# Patient Record
Sex: Female | Born: 1940 | Race: White | Hispanic: No | Smoking: Former smoker
Health system: Southern US, Community
[De-identification: ages and names within clinical notes are randomized; demographics above are authoritative.]

## PROBLEM LIST (undated history)

## (undated) DIAGNOSIS — T4145XA Adverse effect of unspecified anesthetic, initial encounter: Secondary | ICD-10-CM

## (undated) DIAGNOSIS — K449 Diaphragmatic hernia without obstruction or gangrene: Secondary | ICD-10-CM

## (undated) DIAGNOSIS — F419 Anxiety disorder, unspecified: Secondary | ICD-10-CM

## (undated) DIAGNOSIS — Z9889 Other specified postprocedural states: Secondary | ICD-10-CM

## (undated) DIAGNOSIS — T8859XA Other complications of anesthesia, initial encounter: Secondary | ICD-10-CM

## (undated) DIAGNOSIS — F32A Depression, unspecified: Secondary | ICD-10-CM

## (undated) DIAGNOSIS — E785 Hyperlipidemia, unspecified: Secondary | ICD-10-CM

## (undated) DIAGNOSIS — I10 Essential (primary) hypertension: Secondary | ICD-10-CM

## (undated) DIAGNOSIS — F329 Major depressive disorder, single episode, unspecified: Secondary | ICD-10-CM

## (undated) DIAGNOSIS — M797 Fibromyalgia: Secondary | ICD-10-CM

## (undated) DIAGNOSIS — K219 Gastro-esophageal reflux disease without esophagitis: Secondary | ICD-10-CM

## (undated) DIAGNOSIS — E039 Hypothyroidism, unspecified: Secondary | ICD-10-CM

## (undated) DIAGNOSIS — R112 Nausea with vomiting, unspecified: Secondary | ICD-10-CM

## (undated) HISTORY — PX: BACK SURGERY: SHX140

## (undated) HISTORY — PX: OTHER SURGICAL HISTORY: SHX169

## (undated) HISTORY — PX: KNEE SURGERY: SHX244

## (undated) HISTORY — DX: Diaphragmatic hernia without obstruction or gangrene: K44.9

---

## 1984-09-01 HISTORY — PX: CARPAL TUNNEL RELEASE: SHX101

## 2000-09-01 HISTORY — PX: APPENDECTOMY: SHX54

## 2001-03-03 ENCOUNTER — Ambulatory Visit (HOSPITAL_BASED_OUTPATIENT_CLINIC_OR_DEPARTMENT_OTHER): Admission: RE | Admit: 2001-03-03 | Discharge: 2001-03-03 | Payer: Self-pay | Admitting: Orthopedic Surgery

## 2001-12-09 ENCOUNTER — Ambulatory Visit (HOSPITAL_COMMUNITY): Admission: RE | Admit: 2001-12-09 | Discharge: 2001-12-09 | Payer: Self-pay | Admitting: Orthopaedic Surgery

## 2002-05-03 ENCOUNTER — Ambulatory Visit (HOSPITAL_COMMUNITY): Admission: RE | Admit: 2002-05-03 | Discharge: 2002-05-03 | Payer: Self-pay | Admitting: Orthopaedic Surgery

## 2002-05-03 ENCOUNTER — Encounter: Payer: Self-pay | Admitting: Orthopaedic Surgery

## 2004-05-09 ENCOUNTER — Encounter (HOSPITAL_COMMUNITY): Admission: RE | Admit: 2004-05-09 | Discharge: 2004-05-31 | Payer: Self-pay | Admitting: Orthopaedic Surgery

## 2004-09-30 ENCOUNTER — Ambulatory Visit (HOSPITAL_COMMUNITY): Admission: RE | Admit: 2004-09-30 | Discharge: 2004-09-30 | Payer: Self-pay | Admitting: Orthopaedic Surgery

## 2004-10-08 ENCOUNTER — Ambulatory Visit (HOSPITAL_COMMUNITY): Admission: RE | Admit: 2004-10-08 | Discharge: 2004-10-08 | Payer: Self-pay | Admitting: Orthopaedic Surgery

## 2004-10-10 ENCOUNTER — Encounter (HOSPITAL_COMMUNITY): Admission: RE | Admit: 2004-10-10 | Discharge: 2004-11-09 | Payer: Self-pay | Admitting: Orthopaedic Surgery

## 2005-02-04 ENCOUNTER — Ambulatory Visit: Payer: Self-pay | Admitting: Family Medicine

## 2005-02-11 ENCOUNTER — Ambulatory Visit: Payer: Self-pay | Admitting: Family Medicine

## 2005-05-12 ENCOUNTER — Ambulatory Visit: Payer: Self-pay | Admitting: Family Medicine

## 2005-06-19 ENCOUNTER — Ambulatory Visit: Payer: Self-pay | Admitting: Family Medicine

## 2005-07-02 ENCOUNTER — Ambulatory Visit: Payer: Self-pay | Admitting: Internal Medicine

## 2005-07-10 ENCOUNTER — Ambulatory Visit (HOSPITAL_COMMUNITY): Admission: RE | Admit: 2005-07-10 | Discharge: 2005-07-10 | Payer: Self-pay

## 2005-07-10 ENCOUNTER — Ambulatory Visit: Payer: Self-pay | Admitting: Internal Medicine

## 2005-07-21 ENCOUNTER — Ambulatory Visit (HOSPITAL_COMMUNITY): Admission: RE | Admit: 2005-07-21 | Discharge: 2005-07-21 | Payer: Self-pay | Admitting: Internal Medicine

## 2005-07-22 ENCOUNTER — Ambulatory Visit: Payer: Self-pay | Admitting: Family Medicine

## 2005-08-07 ENCOUNTER — Ambulatory Visit: Payer: Self-pay | Admitting: Family Medicine

## 2006-06-03 ENCOUNTER — Ambulatory Visit: Payer: Self-pay | Admitting: Family Medicine

## 2006-08-04 ENCOUNTER — Ambulatory Visit: Payer: Self-pay | Admitting: Family Medicine

## 2006-11-12 ENCOUNTER — Ambulatory Visit: Payer: Self-pay | Admitting: Family Medicine

## 2006-12-23 ENCOUNTER — Ambulatory Visit: Payer: Self-pay | Admitting: Family Medicine

## 2007-01-11 ENCOUNTER — Ambulatory Visit: Payer: Self-pay | Admitting: Family Medicine

## 2007-01-29 ENCOUNTER — Ambulatory Visit: Payer: Self-pay | Admitting: Family Medicine

## 2007-02-11 ENCOUNTER — Encounter: Admission: RE | Admit: 2007-02-11 | Discharge: 2007-05-12 | Payer: Self-pay | Admitting: Orthopedic Surgery

## 2007-03-17 ENCOUNTER — Ambulatory Visit (HOSPITAL_COMMUNITY): Admission: RE | Admit: 2007-03-17 | Discharge: 2007-03-17 | Payer: Self-pay | Admitting: Orthopaedic Surgery

## 2007-09-29 ENCOUNTER — Inpatient Hospital Stay (HOSPITAL_COMMUNITY): Admission: RE | Admit: 2007-09-29 | Discharge: 2007-10-03 | Payer: Self-pay | Admitting: Neurosurgery

## 2008-09-02 ENCOUNTER — Emergency Department (HOSPITAL_COMMUNITY): Admission: EM | Admit: 2008-09-02 | Discharge: 2008-09-02 | Payer: Self-pay | Admitting: Emergency Medicine

## 2009-04-02 ENCOUNTER — Other Ambulatory Visit: Admission: RE | Admit: 2009-04-02 | Discharge: 2009-04-02 | Payer: Self-pay | Admitting: Obstetrics and Gynecology

## 2009-05-08 ENCOUNTER — Ambulatory Visit (HOSPITAL_COMMUNITY): Admission: RE | Admit: 2009-05-08 | Discharge: 2009-05-08 | Payer: Self-pay | Admitting: Obstetrics & Gynecology

## 2009-08-29 ENCOUNTER — Ambulatory Visit (HOSPITAL_COMMUNITY): Admission: RE | Admit: 2009-08-29 | Discharge: 2009-08-29 | Payer: Self-pay | Admitting: Neurosurgery

## 2009-10-03 ENCOUNTER — Inpatient Hospital Stay (HOSPITAL_COMMUNITY): Admission: RE | Admit: 2009-10-03 | Discharge: 2009-10-06 | Payer: Self-pay | Admitting: Neurosurgery

## 2010-05-14 ENCOUNTER — Other Ambulatory Visit: Admission: RE | Admit: 2010-05-14 | Discharge: 2010-05-14 | Payer: Self-pay | Admitting: Obstetrics & Gynecology

## 2010-11-17 LAB — CBC
HCT: 37.6 % (ref 36.0–46.0)
Hemoglobin: 12.9 g/dL (ref 12.0–15.0)
MCHC: 34.3 g/dL (ref 30.0–36.0)
MCV: 91 fL (ref 78.0–100.0)
Platelets: 211 10*3/uL (ref 150–400)
RBC: 4.14 MIL/uL (ref 3.87–5.11)
RDW: 13.7 % (ref 11.5–15.5)
WBC: 7.8 10*3/uL (ref 4.0–10.5)

## 2010-11-17 LAB — BASIC METABOLIC PANEL
BUN: 23 mg/dL (ref 6–23)
CO2: 28 mEq/L (ref 19–32)
Calcium: 9.2 mg/dL (ref 8.4–10.5)
Chloride: 102 mEq/L (ref 96–112)
Creatinine, Ser: 0.94 mg/dL (ref 0.4–1.2)
GFR calc Af Amer: >60 mL/min (ref >60)
GFR calc non Af Amer: 59 mL/min — ABNORMAL LOW (ref >60)
Glucose, Bld: 104 mg/dL — ABNORMAL HIGH (ref 70–99)
Potassium: 4.7 mEq/L (ref 3.5–5.1)
Sodium: 137 mEq/L (ref 135–145)

## 2010-11-17 LAB — TYPE AND SCREEN
ABO/RH(D): A POS
Antibody Screen: NEGATIVE

## 2010-11-20 LAB — BASIC METABOLIC PANEL
BUN: 15 mg/dL (ref 6–23)
CO2: 27 mEq/L (ref 19–32)
Calcium: 7.6 mg/dL — ABNORMAL LOW (ref 8.4–10.5)
Chloride: 105 mEq/L (ref 96–112)
Creatinine, Ser: 1.03 mg/dL (ref 0.4–1.2)
GFR calc Af Amer: >60 mL/min (ref >60)
GFR calc non Af Amer: 53 mL/min — ABNORMAL LOW (ref >60)
Glucose, Bld: 124 mg/dL — ABNORMAL HIGH (ref 70–99)
Potassium: 3.9 mEq/L (ref 3.5–5.1)
Sodium: 136 mEq/L (ref 135–145)

## 2010-11-20 LAB — GLUCOSE, CAPILLARY
Glucose-Capillary: 102 mg/dL — ABNORMAL HIGH (ref 70–99)
Glucose-Capillary: 118 mg/dL — ABNORMAL HIGH (ref 70–99)
Glucose-Capillary: 120 mg/dL — ABNORMAL HIGH (ref 70–99)
Glucose-Capillary: 124 mg/dL — ABNORMAL HIGH (ref 70–99)
Glucose-Capillary: 124 mg/dL — ABNORMAL HIGH (ref 70–99)
Glucose-Capillary: 125 mg/dL — ABNORMAL HIGH (ref 70–99)
Glucose-Capillary: 126 mg/dL — ABNORMAL HIGH (ref 70–99)
Glucose-Capillary: 141 mg/dL — ABNORMAL HIGH (ref 70–99)
Glucose-Capillary: 145 mg/dL — ABNORMAL HIGH (ref 70–99)
Glucose-Capillary: 155 mg/dL — ABNORMAL HIGH (ref 70–99)
Glucose-Capillary: 97 mg/dL (ref 70–99)
Glucose-Capillary: 99 mg/dL (ref 70–99)

## 2010-11-20 LAB — CBC
HCT: 27.8 % — ABNORMAL LOW (ref 36.0–46.0)
Hemoglobin: 9.4 g/dL — ABNORMAL LOW (ref 12.0–15.0)
MCHC: 34.1 g/dL (ref 30.0–36.0)
MCV: 91.3 fL (ref 78.0–100.0)
Platelets: 174 10*3/uL (ref 150–400)
RBC: 3.04 MIL/uL — ABNORMAL LOW (ref 3.87–5.11)
RDW: 14.3 % (ref 11.5–15.5)
WBC: 6.9 10*3/uL (ref 4.0–10.5)

## 2010-12-02 LAB — CREATININE, SERUM
Creatinine, Ser: 1 mg/dL (ref 0.4–1.2)
GFR calc Af Amer: >60 mL/min (ref >60)
GFR calc non Af Amer: 55 mL/min — ABNORMAL LOW (ref >60)

## 2010-12-16 LAB — BASIC METABOLIC PANEL
BUN: 13 mg/dL (ref 6–23)
CO2: 24 mEq/L (ref 19–32)
Calcium: 8.7 mg/dL (ref 8.4–10.5)
Chloride: 103 mEq/L (ref 96–112)
Creatinine, Ser: 0.93 mg/dL (ref 0.4–1.2)
GFR calc Af Amer: >60 mL/min (ref >60)
GFR calc non Af Amer: >60 mL/min (ref >60)
Glucose, Bld: 124 mg/dL — ABNORMAL HIGH (ref 70–99)
Potassium: 3.8 mEq/L (ref 3.5–5.1)
Sodium: 133 mEq/L — ABNORMAL LOW (ref 135–145)

## 2010-12-16 LAB — DIFFERENTIAL
Basophils Absolute: 0 10*3/uL (ref 0.0–0.1)
Basophils Relative: 0 % (ref 0–1)
Eosinophils Absolute: 0.1 10*3/uL (ref 0.0–0.7)
Eosinophils Relative: 1 % (ref 0–5)
Lymphocytes Relative: 27 % (ref 12–46)
Lymphs Abs: 2.4 10*3/uL (ref 0.7–4.0)
Monocytes Absolute: 0.8 10*3/uL (ref 0.1–1.0)
Monocytes Relative: 9 % (ref 3–12)
Neutro Abs: 5.7 10*3/uL (ref 1.7–7.7)
Neutrophils Relative %: 64 % (ref 43–77)

## 2010-12-16 LAB — CBC
HCT: 39.1 % (ref 36.0–46.0)
Hemoglobin: 13 g/dL (ref 12.0–15.0)
MCHC: 33.3 g/dL (ref 30.0–36.0)
MCV: 91.1 fL (ref 78.0–100.0)
Platelets: 185 10*3/uL (ref 150–400)
RBC: 4.29 MIL/uL (ref 3.87–5.11)
RDW: 13.1 % (ref 11.5–15.5)
WBC: 9 10*3/uL (ref 4.0–10.5)

## 2010-12-16 LAB — GLUCOSE, CAPILLARY: Glucose-Capillary: 127 mg/dL — ABNORMAL HIGH (ref 70–99)

## 2011-01-14 NOTE — Discharge Summary (Signed)
Cynthia Mays, Cynthia Mays           ACCOUNT NO.:  [id number]   MEDICAL RECORD NO.:  [id number]          PATIENT TYPE:  INP   LOCATION:  3034                         FACILITY:  [facility]   PHYSICIAN:  Hilda Lias, M.D.   DATE OF BIRTH:  Nov 15, 1940   DATE OF ADMISSION:  09/29/2007  DATE OF DISCHARGE:  10/03/2007                               DISCHARGE SUMMARY   ADMISSION DIAGNOSES:  1. L4-L5 spondylolisthesis.  2. Degenerative disk disease.   FINAL DIAGNOSES:  1. L4-L5 spondylolisthesis.  2. Degenerative disk disease.   CLINICAL HISTORY:  The patient was admitted by Dr. Lovell Sheehan because of  back pain __________ in both legs.  X-rays showed degenerative disk  disease at the L4-L5.  Surgery was advised.   LABORATORY:  Normal.   HOSPITAL COURSE:  The patient was taken to surgery.  An L4-5 fusion was  done.  Today she is doing great, ambulating.  She has no complaints  whatsoever.  She is being discharged to be followed by Dr. Lovell Sheehan.   CONDITION ON DISCHARGE:  Improved.   MEDICATIONS:  Percocet, diazepam.   She will be taking all the previous medications she was on.   ACTIVITY:  Not to drive for next 3 weeks..   FOLLOWUP:  Call to be seen by Dr. Lovell Sheehan in 3 weeks.           ______________________________  Hilda Lias, M.D.     EB/MEDQ  D:  10/03/2007  T:  10/03/2007  Job:  [id number]

## 2011-01-14 NOTE — Op Note (Signed)
NAME:  NIKO, JAKEL           ACCOUNT NO.:  [id number]   MEDICAL RECORD NO.:  [id number]          PATIENT TYPE:  INP   LOCATION:  3034                         FACILITY:  [facility]   PHYSICIAN:  Cristi Loron, M.D.DATE OF BIRTH:  [birth year]   DATE OF PROCEDURE:  09/29/2007  DATE OF DISCHARGE:                               OPERATIVE REPORT   BRIEF HISTORY:  The patient is 70 year old white female who has suffered  from back and bilateral leg pain consistent with neurogenic  claudication.  She failed medical management.  Work up with a lumbar  MRI, which demonstrated the patient had spondylolisthesis and severe  spinal stenosis with severe facet arthropathy at L4-5 and degenerative  disease.  I discussed various treatments options with the patient  including surgery.  She weighed the risks, benefits, and alternatives of  the surgery, so I will proceed with a decompressive laminectomy of L5  with an L4-5 instrumentation and fusion.   PREOPERATIVE DIAGNOSES:  L4-5 grade 1 acquired spondylolisthesis L4-5,  facet arthropathy, spondylosis, disk degeneration and stenosis, lumbar  radiculopathy, and lumbago.   POSTOPERATIVE DIAGNOSES:  L4-5 grade 1 acquired spondylolisthesis L4-5,  facet arthropathy, spondylosis, disk degeneration and stenosis, lumbar  radiculopathy, and lumbago.   PROCEDURE:  Decompressive bilateral laminotomies to decompress the  bilateral L4-L5 nerve roots; L4-5 transforaminal lumbar interbody fusion  with local autograft bone and Actifuse bone graft extender; insertion of  L4-5 interbody prosthesis (Alphatec PEEK interbody prosthesis).  An L4-5  posterior nonsegmental spacing with legacy titanium pedicle screws and  rods; L4-5 posterolateral arthrodesis with local autograft bone and  Actifuse bone graft extender.   SURGEON:  Cristi Loron, M.D.   ASSISTANT:  Payton Doughty, M.D.   ANESTHESIA:  General endotracheal.   ESTIMATED BLOOD LOSS:  250 mL.   SPECIMENS:  None.   DRAINS:  None.   COMPLICATIONS:  None.   PROCEDURE:  The patient was brought to the operating room by anesthesia  team.  General endotracheal anesthesia was induced.  The patient was  turned to the prone position on Wilson frame.  The lumbosacral region  was then prepared with Betadine scrub and Betadine solution.  Sterile  drapes were applied and then injected area was massaged with Marcaine  with epinephrine solution.  A scalpel making a linear midline incision  over the L4-5 interspace.  I used electrocautery to perform a bilateral  subperiosteal dissection exposing spinous process lamina of L4-5.  We  obtained an intraoperative radiograph to confirm our location.   We then inserted the McCullogh retractor for exposure.  We began the  decompression by performing bilateral L4 laminotomies with a high-speed  drill.  I widened the laminotomies with Kerrison punch and we  encountered severely hypertrophied ligamentum flavum at L4-5.  We  removed this.  We then performed foraminotomies about the bilateral L4  and L5 nerve roots completing the decompression at this level.  Of note,  the decompression at this level was greater than than would have been  required due to fusion secondary to the spondylolisthesis and the severe  stenosis/facetarthropathy.   Having completed the decompression,  we now turned our attention to the  arthrodesis.  I removed the inferior facet of L4 on the left then could  gain wire access  to the lateral aspect of the left L4-5 intervertebral  disk.  I incised the intervertebral disk with 15 blade scalpel performed  a partial intervertebral diskectomy with pituitary forceps.  We prepared  the vertebral endplates for fusion using the curettes.  We then used  trial spacers and determined to use a 10 x 26 mm PEEK Alphatec interbody  spacer.  We prefilled this spacer with local autograft bone and  Actifuse, as we did with the ventral disk space.   We inserted the  prosthesis into the left L4-5 interspace, of course after retracting the  neural structures out of harms way.  We then used the bone tamps to turn  the prosthesis laterally and then we filled posteriorly the disk space  with Actifuse and local autograft bone completing the transforaminal  lumbar interbody fusion.   We now turned our attention to the instrumentation under fluoroscopic  guidance.  I cannulated the bilateral L4-L5 pedicles with bone probe.  We tapped the pedicles of 5.5 mm tap and then probed inside the tapped  pedicles around cortical breeches and inserted 6.5 x 50 mm Bioscrews  bilaterally at L4 into the L4 pedicles and 6.5 x 45 bilaterally into the  L5 pedicles under fluoroscopic guidance.  We then palpated along the  medial aspect of the pedicles and noted that there was no cortical  breeches and the L4-L5 nerve roots were not injured.  We then connected  unilateral pedicle screws with a lordotic rod.  We slightly compressed  the construct and then secured the rod in place by tightening the caps  appropriately.  This completed the instrumentation.   We now turned our attention to posterolateral arthrodesis.  We used high-  speed drill to decorticate the remainder of the right L4-5 facet pars  and transverse prosthesis. With the combination of local autograft bone  and Actifuse bone graft extender over these decorticated posterolateral  structures completing the posterolateral arthrodesis.   We then obtained hemostasis using bipolar cautery, irrigated the wound  with bacitracin solution.  We then inspected the thecal sac and  bilateral L4-5 nerve roots, and noted they were well decompressed.  We  then removed the retractor and then reapproximated the patient's  thoracolumbar fascia with interrupted #1 Vicryl suture, subcutaneous  tissue with interrupted 2-0 Vicryl suture and skin with Steri-Strips and  Benzoin.  The wound was then covered with  bacitracin ointment.  A  sterile dressing was applied.  The drapes were removed and the patient  was subsequently returned to the supine position where she was extubated  by the anesthesia team and transported to postanesthesia care unit in  stable condition.  All sponge, instrument, and needle counts were  correct in this case.      Cristi Loron, M.D.  Electronically Signed     JDJ/MEDQ  D:  09/29/2007  T:  09/30/2007  Job:  [id number]

## 2011-01-17 NOTE — Op Note (Signed)
[facility]  Patient:    Cynthia Mays, INGLES Visit Number: [id number] MRN: [id number]          Service Type: DSU Location: DAY Attending Physician:  Windle Guard Dictated by:   Darreld Mclean, M.D. Proc. Date: 12/09/01 Admit Date:  12/09/2001                             Operative Report  PREOPERATIVE DIAGNOSES: 1. Tear of medial meniscus, right knee. 2. Degenerative joint disease.  POSTOPERATIVE DIAGNOSES: 1. Tear of medial meniscus, right knee. 2. Degenerative joint disease.  OPERATION:  Operative arthroscopy and partial median meniscectomy.  SURGEON:  Darreld Mclean, M.D.  ASSISTANT:  Candace Cruise, P.A.-C.  ANESTHESIA:  General.  DRAINS:  None.  TOURNIQUET TIME:  Please refer to anesthesia record.  INDICATIONS:  The patient was playing tennis.  An MRI shows tear of the medial meniscus of the knee on the right.  She has not improved with conservative treatment.  Risks and alternatives have been discussed.  The patient appears to understand and agrees to the procedure as outlined.  OPERATIVE FINDINGS:  The suprapatellar pouch shows minimal synovitis.  There was DJD on the surface of the patella grade II.  There was a tear initially the posterior horn.  The medial meniscus had a _____ tear with multiple segments.  There were grade 2-3 changes in the femoral condyle tibial plateau. Medially, the anterior cruciate was intact.  Laterally there were just grade 2 changes, and the meniscus was intact.  No loose bodies.  Gutters were negative.  DESCRIPTION OF PROCEDURE:  The patient was placed supine on the operating table.  Tourniquet and leg holder placed and inflated on the right upper thigh.  General anesthesia was given.  She was prepped and draped in the usual manner.  The leg was elevated and wrapped circumferentially with an Esmarch bandage.  Tourniquet inflated 350 mmHg.  Esmarch bandage removed.  In-flow cannula inserted medially.   The lactated Ringers were started and instilled in the knee by an infusion pump.  Arthroscope inserted laterally.  Knee examined systematically examined.  Please see findings above.  Attention was directed medially.  Using the meniscal shaver and meniscal punch, the meniscus tear was removed, and a good smooth contour was obtained.  The knee was systematically re-examined and no pathology found.  The wound was irrigated with the remaining part of the lactated Ringers.  Wounds were reapproximated with 3-0 nylon interrupted vertical mattress manner.  Marcaine 0.25% was instilled in each portal.  Sterile dressing applied.  The tourniquet was deflated in the usual fashion.  Please see anesthesia record for tourniquet time.  Sterile dressing applied.  Bulky dressing applied.  Knee immobilizer applied.  The patient tolerated the procedure well and went to recovery in good condition. Sponge and needle counts were correct.  Appropriate anesthesia was given to the patient.  DISPOSITION:  I will see him in the office in approximately 10 days to two weeks.  Physical therapy has been arranged.  If there are any difficulties she is to contact me through the office or hospital. Dictated by:   Darreld Mclean, M.D. Attending Physician:  Windle Guard DD:  12/09/01 TD:  12/09/01 Job: [id number]

## 2011-01-17 NOTE — Op Note (Signed)
[place]  Patient:    Cynthia Mays, Cynthia Mays               MRN: [id number] Proc. Date: 03/03/01 Adm. Date:  [id number] Attending:  Marlowe Shores                           Operative Report  PREOPERATIVE DIAGNOSES:  Right carpal tunnel syndrome, right long finger stenosing tenosynovitis, left long finger stenosing tenosynovitis.  POSTOPERATIVE DIAGNOSES:  Right carpal tunnel syndrome, right long finger stenosing tenosynovitis, left long finger stenosing tenosynovitis.  PROCEDURE: 1. Right carpal tunnel release. 2. Right long finger A1 pulley release. 3. Injection of left long finger A1 pulley.  SURGEON:  Artist Pais. Mina Marble, M.D.  ASSISTANT:  None.  ANESTHESIA:  General.  TOURNIQUET TIME:  25 minutes.  COMPLICATIONS:  None.  DRAINS:  None.  OPERATIVE REPORT:  Patient was taken to the operating room where after the induction of adequate general anesthesia the right upper extremity was prepped and draped in the usual sterile fashion.  An Esmarch was used to exsanguinate the limb.  Tourniquet was then inflated to 250 mmHg.  At this point in time a 2 cm incision was made in the palmar aspect of the right hand in line with the radial border of the ring finger ______ thenar crease.  The incision was taken down through the skin and subcutaneous tissues until the palmar fascia was identified.  The palmar fascia was split longitudinally and thus exposing the transverse carpal ligament.  Next, using a 15 blade the transverse carpal ligament was divided in its entirety, thus decompressing the median nerve. The canal was inspected.  No inner osseus lesions or ganglia present.  The wound was then thoroughly irrigated and closed with a running 3-0 Prolene subcuticular stitch.  At this point in time a 1.5 cm incision was made obliquely in the area of the A1 pulley in the long finger on the right.  The incision was taken down through skin and  subcutaneous tissues, carried carefully down by retracting the neurovascular bundles on the radial ulnar side of the A1 pulley.  After this was done the A1 pulley was split with peritoneotomy scissors, thus freeing up the flexor tendons.  The wound itself was irrigated and closed with a running 3-0 Prolene subcuticular stitch. Steri-Strips, 4 x 4s Plus, and a compressive dressing was applied.  Patient was then injected in the left hand in the area of the A1 pulley using sterile technique using 0.25% plain Marcaine and 40 Kenalog 1 cc each in the left long finger A1 pulley area.  Patient tolerated procedure well and went to recovery room in stable fashion. DD:  03/03/01 TD:  03/03/01 Job: [id number]

## 2011-01-17 NOTE — Consult Note (Signed)
[name], [name]           ACCOUNT NO.:  [id number]   MEDICAL RECORD NO.:  [id number]         PATIENT TYPE:  AMB   LOCATION:                                FACILITY:  [facility]   PHYSICIAN:  Lionel December, M.D.    DATE OF BIRTH:  Jun 15, 1941   DATE OF CONSULTATION:  07/02/2005  DATE OF DISCHARGE:                                   CONSULTATION   REFERRING PHYSICIAN:  Delaney Meigs, M.D.   REASON FOR CONSULTATION:  Chronic GERD, intermittent diarrhea and  constipation.   HISTORY OF PRESENT ILLNESS:  Ms. Cynthia Mays is a 70 year old, Caucasian female  with chronic GERD for at least the last 5 years.  She had been on Nexium 40  mg daily for the last year.  She began to have refractory symptoms and was  started on b.i.d. therapy about 2 months ago.  Her symptoms include  heartburn, indigestion, nausea, vomiting, regurgitation and water brash.  She has never had an EGD.  She denies any dysphagia or odynophagia.  She  also notes alternating bowel movements.  She can have a daily bowel movement  and then can have about 24 hours of loose, watery diarrhea.  She has taken  Imodium on a p.r.n. basis with good relief of her diarrhea.  She complains  of bilateral lower quadrant cramping and bloating with the diarrhea.  She  can have several small to moderate volume stools per day.  She denies any  rectal bleeding, melena or mucus.  She notes the diarrhea has decreased in  frequency since she has discontinued ice cream from her diet.  She rates the  pain 9/10 on pain scale when she does get the cramping.  She has lifelong  spells of intermittent diarrhea, but this has been worse over the last seven  months.   PAST MEDICAL HISTORY:  1.  Colonoscopy in [place] x4 years ago which revealed hemorrhoids and was      otherwise normal per her report.  2.  Hypertension.  3.  Fibromyalgia.  4.  Diabetes mellitus.  5.  Hypothyroidism.  6.  Bilateral knee arthroscopies.  7.  Bilateral carpal tunnel  repair.  8.  C-section x2.  9.  Appendectomy.   CURRENT MEDICATIONS:  1.  Zoloft 50 mg daily.  2.  Procardia XL 60 mg daily.  3.  Synthroid 50 mcg daily.  4.  Allegra D 180 mg b.i.d.  5.  Aspirin 81 mg daily.  6.  Nexium 40 mg b.i.d.  7.  Vicodin ES 7.5/750 q.4h. p.r.n.  8.  Diclofenac 75 mg b.i.d.  9.  Metformin 500 mg nightly.  10. Lipitor 20 mg nightly.  11. Imodium 2 mg p.r.n.   ALLERGIES:  PENICILLIN and CODEINE.   FAMILY HISTORY:  Positive for mother deceased at age 81 secondary to  pancreatic carcinoma.  She has had a father, brother and one child to commit  suicide.   SOCIAL HISTORY:  Ms. Cynthia Mays has been in her second marriage x31 years.  She  has two grown healthy children with one child deceased secondary to suicide.  She is disabled.  She reports a 60-pack-year history of tobacco use quitting  6 years ago.  She denies any alcohol or drug use.   REVIEW OF SYSTEMS:  CONSTITUTIONAL:  Weight is steadily decreasing due to  her dieting and attempt to lower her cholesterol and to help with her  diabetes mellitus.  She denies any anorexia.  She denies any fever or  chills.  She denies nay early satiety.  CARDIOVASCULAR:  Denies chest pain  or palpitations.  She denies any shortness of breath, dyspnea, cough or  hemoptysis.  GASTROINTESTINAL:  See HPI.   PHYSICAL EXAMINATION:  VITAL SIGNS:  Weight 202 pounds, height 60 inches,  temperature 97.6, blood pressure 120/70 and pulse 64.  GENERAL:  Ms. Cynthia Mays is a 70 year old, Caucasian female who is alert,  oriented, pleasant and cooperative in no acute distress.  She is obese.  HEENT:  Sclerae clear.  Conjunctivae pink.  Oropharynx pink and moist  without any lesions.  NECK:  Supple without thyromegaly or masses.  HEART:  Regular rate and rhythm with normal S1, S2 without murmurs, rubs or  gallops.  LUNGS:  Clear to auscultation bilaterally.  ABDOMEN:  Protuberant with multiple striae.  She does have bilateral lower   quadrant tenderness on deep palpation.  No rebound tenderness or guarding.  No hepatosplenomegaly or mass.  RECTAL:  No external lesions visualized.  Good sphincter tone.  There is a  fullness anteriorly on her rectal exam without discrete mass.  Small amount  of light brown stool obtained from the vault which was Hemoccult positive.  EXTREMITIES:  Trace edema bilaterally.  SKIN:  Pink, warm and dry without any rash or jaundice.   LABORATORY DATA AND X-RAY FINDINGS:  LFTs on March 25, 2005, were normal.  She had a hemoglobin A1c of 6.5.  Comprehensive metabolic panel from  May 12, 2005, which was normal except for elevated glucose and a  mildly elevated Alkaline phosphatase at 118.  Her TSH was normal.   IMPRESSION:  Ms. Cynthia Mays is a 70 year old, Caucasian female with chronic  gastroesophageal reflux disease symptoms who has been on twice daily proton  pump inhibitor therapy for about 2 months.  This seems to provide good  relief of her symptoms.  She does have a 57-year-old history of  gastroesophageal reflux disease and therefore should undergo screening  esophagogastroduodenoscopy to rule out changes of Barrett's esophagus.  She  has a lifelong history of intermittent diarrhea worsening over the last 7  months.  She does have several days of normal bowel movements between  episodes.  On exam today, she was found to have Hemoccult positive stool  with no external hemorrhoids to account for this.  I feel since it has been  almost 5 years since she has had a colonoscopy, this needs to be repeated.  I suspect some of her symptoms may be due to diarrhea with predominant  irritable bowel syndrome.   RECOMMENDATIONS:  1.  Levsin one a.c. and h.s., #60 with one refill.  2.  Will request colonoscopy report from Dr. Lovell Sheehan.  3.  Colonoscopy and EGD with Dr. Karilyn Cota in the near future.  I have     discussed this procedure including risks and benefits including, but not      limited to  bleeding, infection, perforation, infection and drug      reaction.  She agrees and consent will be obtained.  She is to hold her      aspirin 3 days prior to the exam.  She is to take half of her metformin      the day prior to and of the procedure.   I would like to thank Dr. Lysbeth Galas for allowing [place] to participate in the care  of Ms. Roseanne Reno.      Nicholas Lose, N.P.      Lionel December, M.D.  Electronically Signed    KC/MEDQ  D:  07/03/2005  T:  07/03/2005  Job:  [id number]

## 2011-01-17 NOTE — H&P (Signed)
NAME:  Cynthia Mays, Cynthia Mays           ACCOUNT NO.:  [id number]   MEDICAL RECORD NO.:  [id number]          PATIENT TYPE:  AMB   LOCATION:  DAY                           FACILITY:  [facility]   PHYSICIAN:  J. Darreld Mclean, M.D. DATE OF BIRTH:  Jul 16, 1941   DATE OF ADMISSION:  DATE OF DISCHARGE:  LH                                HISTORY & PHYSICAL   CHIEF COMPLAINT:  Left knee pain.   The patient is a 70 year old female with pain and tenderness in the left  knee.  This has been bothering her for several weeks.  She fell the first  week of December when visiting a daughter on her left knee.  I saw her in my  office on January 26.  The knee pain was still bothering her and was hurting  significantly.  There was concern about a meniscal tear.  MRI was done of  the knee which shows a tear of the left medial and lateral meniscus of the  knees, oblique tear in the posterior horns of both.  She has DJD of the knee  as well.  Surgery was recommended.  She wants to go ahead and have the  procedure.  Has giving way, swelling, tenderness.  She has a previous  arthroscopy of the right knee in 2003 and did well with this.  This knee  feels like it may be a little worse.  She understands the risks and  imponderables, understands the procedure.   PAST SURGICAL HISTORY:  1.  C section in 1965 and 1967.  2.  Carpal tunnel, left hand in 1984.  3.  Carpal tunnel, right hand in 2002.  4.  Right knee arthroscopy in 2003.   PAST MEDICAL HISTORY:  The patient has history of hypertension, denies heart  disease, lung disease, kidney disease, strokes, paralysis, weakness,  diabetes, TB, rheumatic fever, cancer, polio, ulcer disease, circulatory  problems.   ALLERGIES:  PENICILLIN, NAPROSYN, and CODEINE.   CURRENT MEDICATIONS:  1.  Synthroid 25 mg once daily.  2.  Procardia XL 600 mg once daily.  3.  Zoloft, does unknown, once daily.  4.  Allegra D b.i.d. as needed.  5.  Occasional Tums.   SOCIAL HISTORY:   The patient does not smoke, does not use alcoholic  beverages, high school graduate.  Family doctor is in [place].  The patient  lives in [place].  She is married.   FAMILY HISTORY:  Denies any diseases that run in the family.   PHYSICAL EXAMINATION:  VITAL SIGNS:  Within normal limits.  HEENT:  Negative.  NECK:  Supple.  LUNGS:  Clear to percussion and auscultation.  HEART: Regular rate and rhythm without murmur heard.  ABDOMEN: Soft, nontender, without masses.  EXTREMITIES:  Right knee negative, mild crepitus.  Left knee has crepitus,  swelling, tenderness, with pain and tenderness particularly on the medial  joint line.  McMurray's positive medially.  Other extremities within normal  limits.  CNS:  Intact.  SKIN:  Intact.   IMPRESSION:  1.  Tear of medial and lateral meniscus, left knee.  2.  History of hypertension.  Discussed with patient planned procedure, risks, and imponderables.  Appears  to understand.  Labs are pending.      JWK/MEDQ  D:  10/07/2004  T:  10/07/2004  Job:  [id number]   cc:   [facility]  Fax: [phone]

## 2011-01-17 NOTE — Op Note (Signed)
NAME:  Cynthia Mays, Cynthia Mays           ACCOUNT NO.:  [id number]   MEDICAL RECORD NO.:  [id number]          PATIENT TYPE:  AMB   LOCATION:  DAY                           FACILITY:  [facility]   PHYSICIAN:  Lionel December, M.D.    DATE OF BIRTH:  [birth year]   DATE OF PROCEDURE:  07/10/2005  DATE OF DISCHARGE:                                 OPERATIVE REPORT   PROCEDURE:  Esophagogastroduodenoscopy followed by colonoscopy.   INDICATION:  Cynthia Mays is a 70 year old Caucasian female who has chronic  GERD with symptoms for more than 10 years who was recurrent symptoms within  hours of stopping medicine. She is undergoing EGD to make sure she does not  have Barrett's esophagus. She also gives history of change in her bowel  habits with intermittent diarrhea and constipation and lower abdominal  cramps. She is suspected to have IBS. She is undergoing colonoscopy to make  sure she does not have colitis or neoplasm. Procedure risks were reviewed  the patient, and informed consent was obtained.   PREMEDICATION:  Cetacaine spray for pharyngeal topical anesthesia, Demerol  50 mg IV, Versed 10 mg IV.   FINDINGS:  Procedure performed in endoscopy suite. The patient's vital signs  and O2 saturation were monitored during procedure and remained stable.   PROCEDURE #1:  Esophagogastroduodenoscopy. The patient was placed in left  lateral position. Olympus videoscope was passed via oropharynx without any  difficulty into esophagus.   Esophagus. Mucosa of the proximal middle third was normal. Distally, there  was scarring with pseudodiverticular formation just proximal to GE junction.  GE junction was at 33 cm. It was wavy. Hiatus was at 36 cm from the  incisors. She had a small sliding hiatal hernia.   Stomach. It was empty and distended very well insufflation. Folds of  proximal stomach were normal. Examination of mucosa revealed few antral  erosions, but no ulcer crater was found. Pyloric channel was  patent.  Angularis, fundus and cardia were examined by retroflexing the scope and  were normal.   Duodenum. Bulbar mucosa was normal. Scope was passed into the second part of  the duodenum where mucosa and folds were normal. Endoscope was withdrawn.  The patient prepared for procedure #2.   PROCEDURE #2:  Colonoscopy. Rectal examination performed. No abnormality  noted on external or digital exam. Olympus videoscope was placed in the  rectum and advanced under vision into the sigmoid colon beyond. Preparation  was satisfactory. Scope was passed to cecum which was identified by  ileocecal valve. Blunt-end cecum was well seen and was normal. Pictures  taken for the record. As the scope was withdrawn, colonic mucosa was once  again carefully examined. There were no polyps, diverticulosis, stricture,  or changes of colitis. Rectal mucosa similarly was normal. Scope was  retroflexed to examine anorectal junction which was unremarkable. Endoscope  was straightened and withdrawn. The patient tolerated the procedure well.   FINAL DIAGNOSIS:  ESOPHAGOGASTRODUODENOSCOPY:  Small sliding hiatal hernia  with scarring and pseudodiverticular formation consistent with healed  esophagitis. Erosive antral gastritis. Need to rule out H pylori infection,  but this could be  secondary to NSAID therapy.   COLONOSCOPY:  Normal colonoscopy.   Suspect she has done IBS.   RECOMMENDATIONS:  1.  She will continue anti-reflux measures and Nexium at current dose which      was 40 mg p.o. b.i.d.  2.  High-fiber diet plus FiberChoice 2 tablets daily.  3.  Levsin SL t.i.d. p.r.n.  4.  Since she has GERD, would hold off using longer acting antispasmodic as      it might make her GERD symptoms worse. However, if her abdominal cramps      _____________ low-dose Levbid, i.e. half to 1 tablet every morning.  5.  I will be contacting the patient with results of blood test and further      recommendations.       Lionel December, M.D.  Electronically Signed     NR/MEDQ  D:  07/10/2005  T:  07/10/2005  Job:  [id number]   cc:   Delaney Meigs, M.D.  Fax: [phone]

## 2011-01-17 NOTE — Op Note (Signed)
NAME:  Cynthia Mays, Cynthia Mays           ACCOUNT NO.:  [id number]   MEDICAL RECORD NO.:  [id number]          PATIENT TYPE:  AMB   LOCATION:  DAY                           FACILITY:  [facility]   PHYSICIAN:  J. Darreld Mclean, M.D. DATE OF BIRTH:  August 13, 1941   DATE OF PROCEDURE:  10/08/2004  DATE OF DISCHARGE:                                 OPERATIVE REPORT   PREOPERATIVE DIAGNOSIS:  Left knee median meniscus and lateral meniscus  tear, degenerative joint disease.   POSTOPERATIVE DIAGNOSIS:  Tear of the left medial meniscus, degenerative  joint disease.   PROCEDURE:  Operative arthroscopy, partial left medial meniscus.   ANESTHESIA:  General.   SURGEON:  Dr. Hilda Lias.   ASSISTANT:  Candace Cruise, P.A.-C.   TOURNIQUET TIME:  19 minutes.   DRAINS:  No drains.   COMPLICATIONS:  No apparent complications.   INDICATIONS:  The patient is a 70 year old female with pain and tenderness  in her left knee, giving way, no improvement with conservative treatment.  MRI showed tear of the medial and lateral meniscus of the left knee. Surgery  was recommended. The patient previously had a right knee meniscectomy in  2003, and it has felt like that right knee did back then and wanted to have  the surgical procedure. She understood the risks and imponderables.   FINDINGS:  Suprapatellar pouch, some mild synovitis. Lateral surface of the  patella had grade 2 to 3 changes medially. There were grade 2 to 3 changes  of the articular surfaces with a tear of the posterior horn of the medial  meniscus stellate. Anterior cruciate intact, laterally looked very good with  just grade 2 changes, intact meniscus, no loose bodies.   DESCRIPTION OF PROCEDURE:  The patient was seen in the holding area with her  family member, identified the left knee as the correct surgical site. She  placed initials on the left knee, and I placed my initial. The patient was  brought back to the operating room. She was given general  anesthesia and  placed supine on the operating room table. Tourniquet and leg holder placed,  deflated, left upper thigh. The patient was prepped and draped in the usual  manner. Had a time out again, we reidentified the patient and the left knee.  Leg was elevated after being prepped and draped, wrapped circumferentially  with an Esmarch bandage. Tourniquet inflated to 300 mmHg. Esmarch bandage  removed. Inflow cannula inserted medially. Lactated ringers instilled into  the knee by an infusion pump. Arthroscope inserted laterally and knee  systematically examined. Please see findings above. Permanent pictures were  taken. Attention directed first to the tear of the medial meniscus using a  punch and meniscal shaver. Good smooth contour was obtained. Then we  reexamined the knee. There was no new pathology. Lateral side looked good.  Using a probe, there was no evidence of any tear. The knee was irrigated  with __________________ lactated ringers. Wounds were reapproximated using 3-  0 interrupted vertical mattress manner. Marcaine 0.25% instilled in each  portal. Tourniquet deflated after 19 minutes. Sterile dressing applied.  Bulky dressing applied. The  patient tolerated the procedure well and went to  recovery in good condition. Prescription for Darvocet-N 100 given for pain.  I will see in the office in approximately 10 days to 2 weeks. Numbers have  been provided to contact me through the office or hospital beeper system if  necessary.      JWK/MEDQ  D:  10/08/2004  T:  10/08/2004  Job:  [id number]

## 2011-02-06 ENCOUNTER — Other Ambulatory Visit (HOSPITAL_COMMUNITY): Payer: Self-pay | Admitting: Orthopaedic Surgery

## 2011-02-06 DIAGNOSIS — M25569 Pain in unspecified knee: Secondary | ICD-10-CM

## 2011-02-07 ENCOUNTER — Ambulatory Visit (HOSPITAL_COMMUNITY)
Admission: RE | Admit: 2011-02-07 | Discharge: 2011-02-07 | Disposition: A | Discharge status: Home / Self Care | Payer: PRIVATE HEALTH INSURANCE | Source: Ambulatory Visit | Attending: Orthopaedic Surgery | Admitting: Orthopaedic Surgery

## 2011-02-07 DIAGNOSIS — M25569 Pain in unspecified knee: Secondary | ICD-10-CM | POA: Insufficient documentation

## 2011-02-07 DIAGNOSIS — M2240 Chondromalacia patellae, unspecified knee: Secondary | ICD-10-CM | POA: Insufficient documentation

## 2011-02-07 DIAGNOSIS — M23329 Other meniscus derangements, posterior horn of medial meniscus, unspecified knee: Secondary | ICD-10-CM | POA: Insufficient documentation

## 2011-02-26 ENCOUNTER — Emergency Department (HOSPITAL_COMMUNITY): Payer: PRIVATE HEALTH INSURANCE

## 2011-02-26 ENCOUNTER — Encounter (HOSPITAL_COMMUNITY): Payer: Self-pay | Admitting: Radiology

## 2011-02-26 ENCOUNTER — Emergency Department (HOSPITAL_COMMUNITY)
Admission: EM | Admit: 2011-02-26 | Discharge: 2011-02-26 | Disposition: A | Discharge status: Home / Self Care | Payer: PRIVATE HEALTH INSURANCE | Attending: Emergency Medicine | Admitting: Emergency Medicine

## 2011-02-26 DIAGNOSIS — R0609 Other forms of dyspnea: Secondary | ICD-10-CM | POA: Insufficient documentation

## 2011-02-26 DIAGNOSIS — IMO0001 Reserved for inherently not codable concepts without codable children: Secondary | ICD-10-CM | POA: Insufficient documentation

## 2011-02-26 DIAGNOSIS — R0989 Other specified symptoms and signs involving the circulatory and respiratory systems: Secondary | ICD-10-CM | POA: Insufficient documentation

## 2011-02-26 DIAGNOSIS — R42 Dizziness and giddiness: Secondary | ICD-10-CM | POA: Insufficient documentation

## 2011-02-26 DIAGNOSIS — R0789 Other chest pain: Secondary | ICD-10-CM | POA: Insufficient documentation

## 2011-02-26 DIAGNOSIS — E119 Type 2 diabetes mellitus without complications: Secondary | ICD-10-CM | POA: Insufficient documentation

## 2011-02-26 DIAGNOSIS — I498 Other specified cardiac arrhythmias: Secondary | ICD-10-CM | POA: Insufficient documentation

## 2011-02-26 DIAGNOSIS — I10 Essential (primary) hypertension: Secondary | ICD-10-CM | POA: Insufficient documentation

## 2011-02-26 DIAGNOSIS — M546 Pain in thoracic spine: Secondary | ICD-10-CM | POA: Insufficient documentation

## 2011-02-26 HISTORY — DX: Essential (primary) hypertension: I10

## 2011-02-26 HISTORY — DX: Fibromyalgia: M79.7

## 2011-02-26 LAB — BASIC METABOLIC PANEL
BUN: 20 mg/dL (ref 6–23)
CO2: 22 mEq/L (ref 19–32)
Calcium: 9.1 mg/dL (ref 8.4–10.5)
Chloride: 104 mEq/L (ref 96–112)
Creatinine, Ser: 0.83 mg/dL (ref 0.50–1.10)
GFR calc Af Amer: >60 mL/min (ref >60)
GFR calc non Af Amer: >60 mL/min (ref >60)
Glucose, Bld: 88 mg/dL (ref 70–99)
Potassium: 3.3 mEq/L — ABNORMAL LOW (ref 3.5–5.1)
Sodium: 139 mEq/L (ref 135–145)

## 2011-02-26 LAB — POCT I-STAT, CHEM 8
BUN: 18 mg/dL (ref 6–23)
Calcium, Ion: 1.06 mmol/L — ABNORMAL LOW (ref 1.12–1.32)
Chloride: 110 mEq/L (ref 96–112)
Creatinine, Ser: 0.9 mg/dL (ref 0.50–1.10)
Glucose, Bld: 91 mg/dL (ref 70–99)
HCT: 34 % — ABNORMAL LOW (ref 36.0–46.0)
Hemoglobin: 11.6 g/dL — ABNORMAL LOW (ref 12.0–15.0)
Potassium: 3.3 mEq/L — ABNORMAL LOW (ref 3.5–5.1)
Sodium: 142 mEq/L (ref 135–145)
TCO2: 20 mmol/L (ref 0–100)

## 2011-02-26 LAB — DIFFERENTIAL
Basophils Absolute: 0 10*3/uL (ref 0.0–0.1)
Basophils Relative: 0 % (ref 0–1)
Eosinophils Absolute: 0.3 10*3/uL (ref 0.0–0.7)
Eosinophils Relative: 3 % (ref 0–5)
Lymphocytes Relative: 26 % (ref 12–46)
Lymphs Abs: 2.4 10*3/uL (ref 0.7–4.0)
Monocytes Absolute: 0.6 10*3/uL (ref 0.1–1.0)
Monocytes Relative: 7 % (ref 3–12)
Neutro Abs: 6 10*3/uL (ref 1.7–7.7)
Neutrophils Relative %: 64 % (ref 43–77)

## 2011-02-26 LAB — CBC
HCT: 33 % — ABNORMAL LOW (ref 36.0–46.0)
Hemoglobin: 11 g/dL — ABNORMAL LOW (ref 12.0–15.0)
MCH: 26.8 pg (ref 26.0–34.0)
MCHC: 33.3 g/dL (ref 30.0–36.0)
MCV: 80.3 fL (ref 78.0–100.0)
Platelets: 197 10*3/uL (ref 150–400)
RBC: 4.11 MIL/uL (ref 3.87–5.11)
RDW: 14.1 % (ref 11.5–15.5)
WBC: 9.3 10*3/uL (ref 4.0–10.5)

## 2011-02-26 LAB — CK TOTAL AND CKMB (NOT AT ARMC)
CK, MB: 2.2 ng/mL (ref 0.3–4.0)
Relative Index: 2.2 (ref 0.0–2.5)
Total CK: 100 U/L (ref 7–177)

## 2011-02-26 LAB — TROPONIN I: Troponin I: <0.3 ng/mL (ref <0.30)

## 2011-03-03 MED ORDER — IOHEXOL 300 MG/ML  SOLN
100.0000 mL | Freq: Once | INTRAMUSCULAR | Status: AC | PRN
  Administered 2011-03-03: 100 mL via INTRAVENOUS

## 2011-05-22 LAB — TYPE AND SCREEN
ABO/RH(D): A POS
Antibody Screen: NEGATIVE

## 2011-05-22 LAB — CBC
HCT: 30.9 — ABNORMAL LOW
HCT: 39.9
Hemoglobin: 10.4 — ABNORMAL LOW
Hemoglobin: 13.4
MCHC: 33.6
MCHC: 33.6
MCV: 90.9
MCV: 91.2
Platelets: 192
Platelets: 275
RBC: 3.39 — ABNORMAL LOW
RBC: 4.39
RDW: 13.5
RDW: 13.7
WBC: 6.7
WBC: 8.3

## 2011-05-22 LAB — BASIC METABOLIC PANEL
BUN: 13
BUN: 17
CO2: 27
CO2: 29
Calcium: 8.3 — ABNORMAL LOW
Calcium: 9.5
Chloride: 105
Chloride: 105
Creatinine, Ser: 0.9
Creatinine, Ser: 0.91
GFR calc Af Amer: >60
GFR calc Af Amer: >60
GFR calc non Af Amer: >60
GFR calc non Af Amer: >60
Glucose, Bld: 144 — ABNORMAL HIGH
Glucose, Bld: 90
Potassium: 4.2
Potassium: 4.3
Sodium: 137
Sodium: 139

## 2011-05-22 LAB — POCT I-STAT 4, (NA,K, GLUC, HGB,HCT)
Glucose, Bld: 173 — ABNORMAL HIGH
HCT: 33 — ABNORMAL LOW
Hemoglobin: 11.2 — ABNORMAL LOW
Operator id: 156951
Potassium: 3.9
Sodium: 139

## 2011-05-22 LAB — POCT I-STAT GLUCOSE
Glucose, Bld: 171 — ABNORMAL HIGH
Operator id: 151301

## 2011-05-22 LAB — ABO/RH: ABO/RH(D): A POS

## 2011-06-20 ENCOUNTER — Other Ambulatory Visit (HOSPITAL_COMMUNITY)
Admission: RE | Admit: 2011-06-20 | Discharge: 2011-06-20 | Disposition: A | Discharge status: Home / Self Care | Payer: PRIVATE HEALTH INSURANCE | Source: Ambulatory Visit | Attending: Obstetrics & Gynecology | Admitting: Obstetrics & Gynecology

## 2011-06-20 ENCOUNTER — Other Ambulatory Visit: Payer: Self-pay | Admitting: Obstetrics & Gynecology

## 2011-06-20 DIAGNOSIS — Z01419 Encounter for gynecological examination (general) (routine) without abnormal findings: Secondary | ICD-10-CM | POA: Insufficient documentation

## 2011-07-10 ENCOUNTER — Encounter (HOSPITAL_COMMUNITY): Payer: Self-pay | Admitting: Pharmacy Technician

## 2011-07-11 ENCOUNTER — Encounter (HOSPITAL_COMMUNITY): Payer: Self-pay | Admitting: Pharmacy Technician

## 2011-07-11 ENCOUNTER — Encounter (HOSPITAL_COMMUNITY)
Admission: RE | Admit: 2011-07-11 | Discharge: 2011-07-11 | Disposition: A | Discharge status: Home / Self Care | Payer: PRIVATE HEALTH INSURANCE | Source: Ambulatory Visit | Attending: Orthopaedic Surgery | Admitting: Orthopaedic Surgery

## 2011-07-11 ENCOUNTER — Encounter (HOSPITAL_COMMUNITY): Payer: Self-pay

## 2011-07-11 HISTORY — DX: Depression, unspecified: F32.A

## 2011-07-11 HISTORY — DX: Adverse effect of unspecified anesthetic, initial encounter: T41.45XA

## 2011-07-11 HISTORY — DX: Major depressive disorder, single episode, unspecified: F32.9

## 2011-07-11 HISTORY — DX: Hyperlipidemia, unspecified: E78.5

## 2011-07-11 HISTORY — DX: Nausea with vomiting, unspecified: R11.2

## 2011-07-11 HISTORY — DX: Other specified postprocedural states: Z98.890

## 2011-07-11 HISTORY — DX: Other complications of anesthesia, initial encounter: T88.59XA

## 2011-07-11 HISTORY — DX: Hypothyroidism, unspecified: E03.9

## 2011-07-11 LAB — COMPREHENSIVE METABOLIC PANEL
ALT: 12 U/L (ref 0–35)
AST: 20 U/L (ref 0–37)
Albumin: 3.9 g/dL (ref 3.5–5.2)
Alkaline Phosphatase: 92 U/L (ref 39–117)
BUN: 18 mg/dL (ref 6–23)
CO2: 29 mEq/L (ref 19–32)
Calcium: 9.7 mg/dL (ref 8.4–10.5)
Chloride: 103 mEq/L (ref 96–112)
Creatinine, Ser: 1.15 mg/dL — ABNORMAL HIGH (ref 0.50–1.10)
GFR calc Af Amer: 55 mL/min — ABNORMAL LOW (ref >90)
GFR calc non Af Amer: 47 mL/min — ABNORMAL LOW (ref >90)
Glucose, Bld: 126 mg/dL — ABNORMAL HIGH (ref 70–99)
Potassium: 3.7 mEq/L (ref 3.5–5.1)
Sodium: 141 mEq/L (ref 135–145)
Total Bilirubin: 0.3 mg/dL (ref 0.3–1.2)
Total Protein: 7.8 g/dL (ref 6.0–8.3)

## 2011-07-11 LAB — URINALYSIS, ROUTINE W REFLEX MICROSCOPIC
Bilirubin Urine: NEGATIVE
Glucose, UA: NEGATIVE mg/dL
Hgb urine dipstick: NEGATIVE
Nitrite: NEGATIVE
Protein, ur: NEGATIVE mg/dL
Specific Gravity, Urine: >1.03 — ABNORMAL HIGH (ref 1.005–1.030)
Urobilinogen, UA: 0.2 mg/dL (ref 0.0–1.0)
pH: 5.5 (ref 5.0–8.0)

## 2011-07-11 LAB — DIFFERENTIAL
Basophils Absolute: 0 10*3/uL (ref 0.0–0.1)
Basophils Relative: 1 % (ref 0–1)
Eosinophils Absolute: 0.2 10*3/uL (ref 0.0–0.7)
Eosinophils Relative: 4 % (ref 0–5)
Lymphocytes Relative: 37 % (ref 12–46)
Lymphs Abs: 2 10*3/uL (ref 0.7–4.0)
Monocytes Absolute: 0.4 10*3/uL (ref 0.1–1.0)
Monocytes Relative: 6 % (ref 3–12)
Neutro Abs: 3 10*3/uL (ref 1.7–7.7)
Neutrophils Relative %: 53 % (ref 43–77)

## 2011-07-11 LAB — SURGICAL PCR SCREEN
MRSA, PCR: NEGATIVE
Staphylococcus aureus: NEGATIVE

## 2011-07-11 LAB — URINE MICROSCOPIC-ADD ON

## 2011-07-11 LAB — CBC
HCT: 38.6 % (ref 36.0–46.0)
Hemoglobin: 12.8 g/dL (ref 12.0–15.0)
MCH: 29.3 pg (ref 26.0–34.0)
MCHC: 33.2 g/dL (ref 30.0–36.0)
MCV: 88.3 fL (ref 78.0–100.0)
Platelets: 223 10*3/uL (ref 150–400)
RBC: 4.37 MIL/uL (ref 3.87–5.11)
RDW: 14 % (ref 11.5–15.5)
WBC: 5.6 10*3/uL (ref 4.0–10.5)

## 2011-07-11 NOTE — Patient Instructions (Addendum)
[address]  07/11/2011   Your procedure is scheduled on:  07/17/2011  Report to [facility] at  800  AM.  Call this number if you have problems the morning of surgery: [phone]   Remember:   Do not eat food:After Midnight.  Do not drink clear liquids: After Midnight.  Take these medicines the morning of surgery with A SIP OF WATER: Nexium, Hydrochlorathiazide, Lisinopril, Procardia, Sertraline. Also, take your inhaler, Flonase.   Do not wear jewelry, make-up or nail polish.  Do not wear lotions, powders, or perfumes. You may wear deodorant.  Do not shave 48 hours prior to surgery.  Do not bring valuables to the hospital.  Contacts, dentures or bridgework may not be worn into surgery.  Leave suitcase in the car. After surgery it may be brought to your room.  For patients admitted to the hospital, checkout time is 11:00 AM the day of discharge.   Patients discharged the day of surgery will not be allowed to drive home.  Name and phone number of your driver: family  Special Instructions: CHG Shower Use Special Wash: 1/2 bottle night before surgery and 1/2 bottle morning of surgery.   Please read over the following fact sheets that you were given: Pain Booklet, MRSA Information, Surgical Site Infection Prevention, Anesthesia Post-op Instructions and Care and Recovery After Surgery      Arthroscopic Procedure, Knee An arthroscopic procedure can find what is wrong with your knee. PROCEDURE Arthroscopy is a surgical technique that allows your orthopedic surgeon to diagnose and treat your knee injury with accuracy. They will look into your knee through a small instrument. This is almost like a small (pencil sized) telescope. Because arthroscopy affects your knee less than open knee surgery, you can anticipate a more rapid recovery. Taking an active role by following your caregiver's instructions will help with rapid and complete recovery. Use crutches, rest, elevation, ice, and  knee exercises as instructed. The length of recovery depends on various factors including type of injury, age, physical condition, medical conditions, and your rehabilitation. Your knee is the joint between the large bones (femur and tibia) in your leg. Cartilage covers these bone ends which are smooth and slippery and allow your knee to bend and move smoothly. Two menisci, thick, semi-lunar shaped pads of cartilage which form a rim inside the joint, help absorb shock and stabilize your knee. Ligaments bind the bones together and support your knee joint. Muscles move the joint, help support your knee, and take stress off the joint itself. Because of this all programs and physical therapy to rehabilitate an injured or repaired knee require rebuilding and strengthening your muscles. AFTER THE PROCEDURE  After the procedure, you will be moved to a recovery area until most of the effects of the medication have worn off. Your caregiver will discuss the test results with you.   Only take over-the-counter or prescription medicines for pain, discomfort, or fever as directed by your caregiver.  SEEK MEDICAL CARE IF:   You have increased bleeding from your wounds.   You see redness, swelling, or have increasing pain in your wounds.   You have pus coming from your wound.   You have an oral temperature above 102 F (38.9 C).   You notice a bad smell coming from the wound or dressing.   You have severe pain with any motion of your knee.  SEEK IMMEDIATE MEDICAL CARE IF:   You develop a rash.   You have difficulty  breathing.   You have any allergic problems.  Document Released: 08/15/2000 Document Revised: 04/30/2011 Document Reviewed: 03/08/2008 [facility] Patient Information 2012 [place], [place].     PATIENT INSTRUCTIONS POST-ANESTHESIA  IMMEDIATELY FOLLOWING SURGERY:  Do not drive or operate machinery for the first twenty four hours after surgery.  Do not make any important decisions for  twenty four hours after surgery or while taking narcotic pain medications or sedatives.  If you develop intractable nausea and vomiting or a severe headache please notify your doctor immediately.  FOLLOW-UP:  Please make an appointment with your surgeon as instructed. You do not need to follow up with anesthesia unless specifically instructed to do so.  WOUND CARE INSTRUCTIONS (if applicable):  Keep a dry clean dressing on the anesthesia/puncture wound site if there is drainage.  Once the wound has quit draining you may leave it open to air.  Generally you should leave the bandage intact for twenty four hours unless there is drainage.  If the epidural site drains for more than 36-48 hours please call the anesthesia department.  QUESTIONS?:  Please feel free to call your physician or the hospital operator if you have any questions, and they will be happy to assist you.     [facility] [address] [place] [phone]

## 2011-07-16 NOTE — H&P (Signed)
Cynthia Mays is an 70 y.o. female.   Chief Complaint: Right knee pain HPI: She has had right knee pain for many months.  She has had swelling and giving way.  She has had deep pain.  MRI done on 02/07/11 showed degenerative tear of the medial meniscus posterior horn area.  She was advised of findings and arthroscopy recommended as an outpatient.  She has waited until now due to personal reasons.  She was informed of the procedure, the risks and imponderables.  She asked appropriate questions and appears to understand the out patient elective procedure.  She continues to have pain, giving way and swelling that is worse that it was in early summer.  Past Medical History  Diagnosis Date  . Hypertension   . Diabetes mellitus   . Fibromyalgia   . Hyperlipidemia   . Hypothyroidism   . Depression   . Complication of anesthesia   . PONV (postoperative nausea and vomiting)     Past Surgical History  Procedure Date  . Carpal tunnel release 1986    bilateral  . Arthroscopy right 2002, left 2007    bilateral  . Cesarean section 1969, 1967    x2  . Back surgery 10/2007, 09/2008    x2  . Appendectomy 2002    Family History  Problem Relation Age of Onset  . Anesthesia problems Mother   . Hypotension Neg Hx   . Pseudochol deficiency Neg Hx   . Malignant hyperthermia Neg Hx    Social History:  reports that she has quit smoking. She does not have any smokeless tobacco history on file. She reports that she drinks alcohol. She reports that she does not use illicit drugs.  Allergies:  Allergies  Allergen Reactions  . Codeine     Headache, gi upset  . Penicillins Rash    No current facility-administered medications on file as of .   No current outpatient prescriptions on file as of .    No results found for this or any previous visit (from the past 48 hour(s)). No results found.  Review of Systems  Constitutional: Negative.   HENT: Negative.   Eyes: Negative.   Respiratory:  Negative.   Cardiovascular:       History of hypertension  Gastrointestinal: Negative.   Genitourinary: Negative.   Musculoskeletal: Positive for joint pain (right knee pain for many months).  Neurological: Negative.   Endo/Heme/Allergies: Negative.   Psychiatric/Behavioral: Negative.     There were no vitals taken for this visit. Physical Exam  Constitutional: She is oriented to person, place, and time. She appears well-developed and well-nourished.  HENT:  Head: Normocephalic.  Eyes: Conjunctivae and EOM are normal. Pupils are equal, round, and reactive to light.  Neck: Normal range of motion.  Cardiovascular: Normal rate, regular rhythm, normal heart sounds and intact distal pulses.   Respiratory: Effort normal and breath sounds normal.  GI: Soft.  Musculoskeletal: She exhibits tenderness (right knee).       Right knee: She exhibits decreased range of motion, swelling and effusion. tenderness found. Medial joint line tenderness noted.       Legs: Neurological: She is alert and oriented to person, place, and time. She has normal reflexes.  Skin: Skin is warm and dry.  Psychiatric: She has a normal mood and affect. Her behavior is normal. Judgment and thought content normal.     Assessment/Plan Arthroscopy of the right knee and partial menisectomy.  Plan post op physical therapy.  Jaleia Hanke 07/16/2011,  4:40 PM

## 2011-07-17 ENCOUNTER — Encounter (HOSPITAL_COMMUNITY): Payer: Self-pay | Admitting: Anesthesiology

## 2011-07-17 ENCOUNTER — Encounter (HOSPITAL_COMMUNITY)
Admission: RE | Disposition: A | Discharge status: Home / Self Care | Payer: Self-pay | Source: Ambulatory Visit | Attending: Orthopaedic Surgery

## 2011-07-17 ENCOUNTER — Ambulatory Visit (HOSPITAL_COMMUNITY): Payer: PRIVATE HEALTH INSURANCE | Admitting: Anesthesiology

## 2011-07-17 ENCOUNTER — Encounter (HOSPITAL_COMMUNITY): Payer: Self-pay | Admitting: *Deleted

## 2011-07-17 ENCOUNTER — Other Ambulatory Visit: Payer: Self-pay | Admitting: Orthopaedic Surgery

## 2011-07-17 ENCOUNTER — Ambulatory Visit (HOSPITAL_COMMUNITY)
Admission: RE | Admit: 2011-07-17 | Discharge: 2011-07-17 | Disposition: A | Discharge status: Home / Self Care | Payer: PRIVATE HEALTH INSURANCE | Source: Ambulatory Visit | Attending: Orthopaedic Surgery | Admitting: Orthopaedic Surgery

## 2011-07-17 DIAGNOSIS — M23329 Other meniscus derangements, posterior horn of medial meniscus, unspecified knee: Secondary | ICD-10-CM | POA: Insufficient documentation

## 2011-07-17 DIAGNOSIS — Z01812 Encounter for preprocedural laboratory examination: Secondary | ICD-10-CM | POA: Insufficient documentation

## 2011-07-17 DIAGNOSIS — I10 Essential (primary) hypertension: Secondary | ICD-10-CM | POA: Insufficient documentation

## 2011-07-17 DIAGNOSIS — Z79899 Other long term (current) drug therapy: Secondary | ICD-10-CM | POA: Insufficient documentation

## 2011-07-17 DIAGNOSIS — E119 Type 2 diabetes mellitus without complications: Secondary | ICD-10-CM | POA: Insufficient documentation

## 2011-07-17 LAB — GLUCOSE, CAPILLARY: Glucose-Capillary: 103 mg/dL — ABNORMAL HIGH (ref 70–99)

## 2011-07-17 SURGERY — ARTHROSCOPY, KNEE, WITH MEDIAL MENISCECTOMY
Anesthesia: General | Site: Knee | Laterality: Right | Wound class: Clean

## 2011-07-17 MED ORDER — MIDAZOLAM HCL 2 MG/2ML IJ SOLN
1.0000 mg | INTRAMUSCULAR | Status: DC | PRN
  Administered 2011-07-17: 2 mg via INTRAVENOUS

## 2011-07-17 MED ORDER — FENTANYL CITRATE 0.05 MG/ML IJ SOLN
INTRAMUSCULAR | Status: DC | PRN
  Administered 2011-07-17: 100 ug via INTRAVENOUS
  Administered 2011-07-17 (×2): 50 ug via INTRAVENOUS

## 2011-07-17 MED ORDER — FENTANYL CITRATE 0.05 MG/ML IJ SOLN
INTRAMUSCULAR | Status: AC
  Administered 2011-07-17: 50 ug via INTRAVENOUS
  Filled 2011-07-17: qty 2

## 2011-07-17 MED ORDER — ONDANSETRON HCL 4 MG/2ML IJ SOLN: 4.0000 mg | Freq: Once | INTRAMUSCULAR | Status: DC | PRN

## 2011-07-17 MED ORDER — TRAMADOL HCL 50 MG PO TABS: 50.0000 mg | ORAL_TABLET | Freq: Four times a day (QID) | ORAL | Status: DC | PRN

## 2011-07-17 MED ORDER — NEOSTIGMINE METHYLSULFATE 1 MG/ML IJ SOLN
INTRAMUSCULAR | Status: DC | PRN
  Administered 2011-07-17: 3 mg via INTRAVENOUS

## 2011-07-17 MED ORDER — LACTATED RINGERS IR SOLN
Status: DC | PRN
  Administered 2011-07-17: 1

## 2011-07-17 MED ORDER — MIDAZOLAM HCL 5 MG/5ML IJ SOLN
INTRAMUSCULAR | Status: DC | PRN
  Administered 2011-07-17: 2 mg via INTRAVENOUS

## 2011-07-17 MED ORDER — LACTATED RINGERS IV SOLN
INTRAVENOUS | Status: DC
  Administered 2011-07-17 (×2): 1000 mL via INTRAVENOUS

## 2011-07-17 MED ORDER — SCOPOLAMINE 1 MG/3DAYS TD PT72
1.0000 | MEDICATED_PATCH | Freq: Once | TRANSDERMAL | Status: DC
  Administered 2011-07-17: 1.5 mg via TRANSDERMAL

## 2011-07-17 MED ORDER — FENTANYL CITRATE 0.05 MG/ML IJ SOLN
INTRAMUSCULAR | Status: AC
  Filled 2011-07-17: qty 5

## 2011-07-17 MED ORDER — DEXAMETHASONE SODIUM PHOSPHATE 4 MG/ML IJ SOLN
INTRAMUSCULAR | Status: AC
  Administered 2011-07-17: 4 mg via INTRAVENOUS
  Filled 2011-07-17: qty 1

## 2011-07-17 MED ORDER — LIDOCAINE HCL (PF) 1 % IJ SOLN
INTRAMUSCULAR | Status: AC
  Filled 2011-07-17: qty 5

## 2011-07-17 MED ORDER — MIDAZOLAM HCL 2 MG/2ML IJ SOLN
INTRAMUSCULAR | Status: AC
  Administered 2011-07-17: 2 mg via INTRAVENOUS
  Filled 2011-07-17: qty 2

## 2011-07-17 MED ORDER — BUPIVACAINE HCL (PF) 0.25 % IJ SOLN
INTRAMUSCULAR | Status: DC | PRN
  Administered 2011-07-17: 30 mL

## 2011-07-17 MED ORDER — ROCURONIUM BROMIDE 100 MG/10ML IV SOLN
INTRAVENOUS | Status: DC | PRN
  Administered 2011-07-17: 40 mg via INTRAVENOUS

## 2011-07-17 MED ORDER — SODIUM CHLORIDE 0.9 % IR SOLN
Status: DC | PRN
  Administered 2011-07-17: 1

## 2011-07-17 MED ORDER — BUPIVACAINE HCL (PF) 0.25 % IJ SOLN
INTRAMUSCULAR | Status: AC
  Filled 2011-07-17: qty 30

## 2011-07-17 MED ORDER — ONDANSETRON HCL 4 MG/2ML IJ SOLN
4.0000 mg | Freq: Once | INTRAMUSCULAR | Status: AC
  Administered 2011-07-17: 4 mg via INTRAVENOUS

## 2011-07-17 MED ORDER — PROPOFOL 10 MG/ML IV EMUL
INTRAVENOUS | Status: AC
  Filled 2011-07-17: qty 20

## 2011-07-17 MED ORDER — PROPOFOL 10 MG/ML IV EMUL
INTRAVENOUS | Status: DC | PRN
  Administered 2011-07-17: 150 mg via INTRAVENOUS

## 2011-07-17 MED ORDER — ROCURONIUM BROMIDE 50 MG/5ML IV SOLN
INTRAVENOUS | Status: AC
  Filled 2011-07-17: qty 1

## 2011-07-17 MED ORDER — NEOSTIGMINE METHYLSULFATE 1 MG/ML IJ SOLN
INTRAMUSCULAR | Status: AC
  Filled 2011-07-17: qty 10

## 2011-07-17 MED ORDER — GLYCOPYRROLATE 0.2 MG/ML IJ SOLN
INTRAMUSCULAR | Status: DC | PRN
  Administered 2011-07-17: .4 mg via INTRAVENOUS

## 2011-07-17 MED ORDER — GLYCOPYRROLATE 0.2 MG/ML IJ SOLN
INTRAMUSCULAR | Status: AC
  Filled 2011-07-17: qty 1

## 2011-07-17 MED ORDER — FENTANYL CITRATE 0.05 MG/ML IJ SOLN
25.0000 ug | INTRAMUSCULAR | Status: DC | PRN
  Administered 2011-07-17: 50 ug via INTRAVENOUS

## 2011-07-17 MED ORDER — ONDANSETRON HCL 4 MG/2ML IJ SOLN
INTRAMUSCULAR | Status: AC
  Administered 2011-07-17: 4 mg via INTRAVENOUS
  Filled 2011-07-17: qty 2

## 2011-07-17 MED ORDER — MIDAZOLAM HCL 2 MG/2ML IJ SOLN
INTRAMUSCULAR | Status: AC
  Filled 2011-07-17: qty 2

## 2011-07-17 MED ORDER — DEXAMETHASONE SODIUM PHOSPHATE 4 MG/ML IJ SOLN
4.0000 mg | INTRAMUSCULAR | Status: AC
  Administered 2011-07-17: 4 mg via INTRAVENOUS

## 2011-07-17 MED ORDER — SCOPOLAMINE 1 MG/3DAYS TD PT72
MEDICATED_PATCH | TRANSDERMAL | Status: AC
  Administered 2011-07-17: 1.5 mg via TRANSDERMAL
  Filled 2011-07-17: qty 1

## 2011-07-17 SURGICAL SUPPLY — 60 items
BAG HAMPER (MISCELLANEOUS) ×3 IMPLANT
BANDAGE ELASTIC 6 VELCRO NS (GAUZE/BANDAGES/DRESSINGS) ×3 IMPLANT
BANDAGE ELASTIC 6 VELCRO ST LF (GAUZE/BANDAGES/DRESSINGS) ×2 IMPLANT
BANDAGE ESMARK 4X12 BL STRL LF (DISPOSABLE) ×2 IMPLANT
BLADE SURG 15 STRL LF DISP TIS (BLADE) ×2 IMPLANT
BLADE SURG 15 STRL SS (BLADE) ×3
BLADE SURG SZ11 CARB STEEL (BLADE) ×3 IMPLANT
BNDG CMPR 12X4 ELC STRL LF (DISPOSABLE) ×2
BNDG ESMARK 4X12 BLUE STRL LF (DISPOSABLE) ×3
CLOTH BEACON ORANGE TIMEOUT ST (SAFETY) ×3 IMPLANT
CUFF TOURNIQUET SINGLE 34IN LL (TOURNIQUET CUFF) IMPLANT
CUFF TOURNIQUET SINGLE 44IN (TOURNIQUET CUFF) ×2 IMPLANT
CUTTER TOMCAT 4.0M (BURR) ×3 IMPLANT
DECANTER SPIKE VIAL GLASS SM (MISCELLANEOUS) ×3 IMPLANT
DRAPE PROXIMA HALF (DRAPES) ×3 IMPLANT
DRSG XEROFORM 1X8 (GAUZE/BANDAGES/DRESSINGS) ×3 IMPLANT
ELECT MENISCUS 165MM 90D (ELECTRODE) IMPLANT
FORMALIN 10 PREFIL 480ML (MISCELLANEOUS) ×3 IMPLANT
GAUZE SPONGE 4X4 16PLY XRAY LF (GAUZE/BANDAGES/DRESSINGS) ×3 IMPLANT
GLOVE BIO SURGEON STRL SZ8 (GLOVE) ×3 IMPLANT
GLOVE BIO SURGEON STRL SZ8.5 (GLOVE) ×3 IMPLANT
GLOVE SS BIOGEL STRL SZ 6.5 (GLOVE) ×1 IMPLANT
GLOVE SUPERSENSE BIOGEL SZ 6.5 (GLOVE) ×1
GLOVE SURG SS PI 7.0 STRL IVOR (GLOVE) ×2 IMPLANT
GOWN STRL REIN XL XLG (GOWN DISPOSABLE) ×6 IMPLANT
HANDPIECE (MISCELLANEOUS) IMPLANT
HLDR LEG FOAM (MISCELLANEOUS) ×2 IMPLANT
IV LACTATED RINGER IRRG 3000ML (IV SOLUTION) ×6
IV LR IRRIG 3000ML ARTHROMATIC (IV SOLUTION) ×4 IMPLANT
KIT BLADEGUARD II DBL (SET/KITS/TRAYS/PACK) ×3 IMPLANT
KIT ROOM TURNOVER AP CYSTO (KITS) ×3 IMPLANT
KNIFE HOOK (BLADE) IMPLANT
KNIFE MENISECTOMY (BLADE) IMPLANT
LEG HOLDER FOAM (MISCELLANEOUS) ×1
MANIFOLD NEPTUNE II (INSTRUMENTS) ×3 IMPLANT
MANIFOLD NEPTUNE WASTE (CANNULA) ×3 IMPLANT
MARKER SKIN DUAL TIP RULER LAB (MISCELLANEOUS) ×3 IMPLANT
NDL HYPO 21X1.5 SAFETY (NEEDLE) ×1 IMPLANT
NDL SPNL 18GX3.5 QUINCKE PK (NEEDLE) ×1 IMPLANT
NEEDLE HYPO 21X1.5 SAFETY (NEEDLE) ×3 IMPLANT
NEEDLE SPNL 18GX3.5 QUINCKE PK (NEEDLE) ×3 IMPLANT
NS IRRIG 1000ML POUR BTL (IV SOLUTION) ×3 IMPLANT
PACK ARTHRO LIMB DRAPE STRL (MISCELLANEOUS) ×3 IMPLANT
PAD ABD 5X9 TENDERSORB (GAUZE/BANDAGES/DRESSINGS) ×3 IMPLANT
PAD ARMBOARD 7.5X6 YLW CONV (MISCELLANEOUS) ×3 IMPLANT
PADDING CAST COTTON 6X4 STRL (CAST SUPPLIES) ×3 IMPLANT
PADDING WEBRIL 6 STERILE (GAUZE/BANDAGES/DRESSINGS) ×2 IMPLANT
SET ARTHROSCOPY INST (INSTRUMENTS) ×3 IMPLANT
SET BASIN LINEN APH (SET/KITS/TRAYS/PACK) ×3 IMPLANT
SOL PREP PROV IODINE SCRUB 4OZ (MISCELLANEOUS) ×3 IMPLANT
SPONGE GAUZE 4X4 12PLY (GAUZE/BANDAGES/DRESSINGS) ×3 IMPLANT
SUT ETHILON 3 0 FSL (SUTURE) ×3 IMPLANT
SYR 30ML LL (SYRINGE) ×3 IMPLANT
TIP 0 DEGREE (MISCELLANEOUS) IMPLANT
TIP 30 DEGREE (MISCELLANEOUS) IMPLANT
TIP 70 DEGREE (MISCELLANEOUS) IMPLANT
TOWEL OR 17X26 4PK STRL BLUE (TOWEL DISPOSABLE) ×3 IMPLANT
TUBING FLOSTEADY ARTHRO PUMP (IRRIGATION / IRRIGATOR) ×3 IMPLANT
WATER STERILE IRR 1000ML POUR (IV SOLUTION) ×3 IMPLANT
YANKAUER SUCT BULB TIP 10FT TU (MISCELLANEOUS) ×6 IMPLANT

## 2011-07-17 NOTE — Anesthesia Postprocedure Evaluation (Signed)
  Anesthesia Post-op Note  Patient: Cynthia Mays  Procedure(s) Performed:  KNEE ARTHROSCOPY WITH MEDIAL MENISECTOMY - partial medial menisectomy  Patient Location: PACU  Anesthesia Type: General  Level of Consciousness: awake, alert , oriented and patient cooperative  Airway and Oxygen Therapy: Patient Spontanous Breathing  Post-op Pain: 2 /10, mild  Post-op Assessment: Post-op Vital signs reviewed, Patient's Cardiovascular Status Stable, Respiratory Function Stable, Patent Airway and No signs of Nausea or vomiting  Post-op Vital Signs: Reviewed and stable  Complications: No apparent anesthesia complications

## 2011-07-17 NOTE — Brief Op Note (Signed)
07/17/2011  11:38 AM  PATIENT:  Cynthia Mays  70 y.o. female  PRE-OPERATIVE DIAGNOSIS:  tear medial meniscus right knee  POST-OPERATIVE DIAGNOSIS:  tear medial meniscus right knee  PROCEDURE:  Procedure(s): KNEE ARTHROSCOPY WITH MEDIAL MENISECTOMY partial  SURGEON:  Surgeon(s): Darreld Mclean  PHYSICIAN ASSISTANT:   ASSISTANTS: none   ANESTHESIA:   general  EBL:  Total I/O In: 200 [I.V.:200] Out: -   BLOOD ADMINISTERED:none  DRAINS: none   LOCAL MEDICATIONS USED:  MARCAINE 30CC  SPECIMEN:  Source of Specimen:  right knee medial meniscus fragments  DISPOSITION OF SPECIMEN:  PATHOLOGY  COUNTS:  YES  TOURNIQUET:   Total Tourniquet Time Documented: Thigh (Right) - 17 minutes  DICTATION: .Other Dictation: Dictation Number [id number]  PLAN OF CARE: Discharge to home after PACU  PATIENT DISPOSITION:  PACU - hemodynamically stable.   Delay start of Pharmacological VTE agent (>24hrs) due to surgical blood loss or risk of bleeding:  n/a

## 2011-07-17 NOTE — Transfer of Care (Signed)
Immediate Anesthesia Transfer of Care Note  Patient: Cynthia Mays  Procedure(s) Performed:  KNEE ARTHROSCOPY WITH MEDIAL MENISECTOMY - partial medial menisectomy  Patient Location: PACU  Anesthesia Type: General  Level of Consciousness: awake, alert , oriented and patient cooperative  Airway & Oxygen Therapy: Patient Spontanous Breathing and Patient connected to face mask oxygen  Post-op Assessment: Report given to PACU RN, Post -op Vital signs reviewed and stable and Patient moving all extremities  Post vital signs: Reviewed and stable  Complications: No apparent anesthesia complications

## 2011-07-17 NOTE — Anesthesia Procedure Notes (Signed)
Procedure Name: Intubation Date/Time: 07/17/2011 10:58 AM Performed by: Despina Hidden Pre-anesthesia Checklist: Patient identified, Emergency Drugs available, Suction available and Patient being monitored Patient Re-evaluated:Patient Re-evaluated prior to inductionOxygen Delivery Method: Circle System Utilized Preoxygenation: Pre-oxygenation with 100% oxygen Intubation Type: IV induction, Rapid sequence and Circoid Pressure applied Ventilation: Mask ventilation with difficulty Laryngoscope Size: Mac and 3 Grade View: Grade I Tube type: Oral Tube size: 7.0 mm Number of attempts: 1 Airway Equipment and Method: stylet Placement Confirmation: ETT inserted through vocal cords under direct vision,  positive ETCO2 and breath sounds checked- equal and bilateral Secured at: 22 cm Tube secured with: Tape Dental Injury: Teeth and Oropharynx as per pre-operative assessment

## 2011-07-17 NOTE — Progress Notes (Signed)
The History and Physical is unchanged. I have examined the patient. The patient is medically able to have surgery on the right knee . Cynthia Mays 

## 2011-07-17 NOTE — Op Note (Signed)
NAME:  Cynthia Mays, Cynthia Mays           ACCOUNT NO.:  [id number]  MEDICAL RECORD NO.:  [id number]  LOCATION:  [facility]                          FACILITY:  [facility]  PHYSICIAN:  J. Darreld Mclean, M.D. DATE OF BIRTH:  09/21/1940  DATE OF PROCEDURE: DATE OF DISCHARGE:  07/17/2011                              OPERATIVE REPORT   PREOPERATIVE DIAGNOSIS:  Tear of the medial meniscus of the right knee plus medial degenerative joint disease.  POSTOPERATIVE DIAGNOSIS:  Tear of the medial meniscus of the right knee plus medial degenerative joint disease.  PROCEDURE:  Operative arthroscopy, partial medial meniscectomy.  ANESTHESIA:  General.  TOURNIQUET TIME:  15 minutes.  DRAINS:  No drains.  SURGEON:  J. Darreld Mclean, M.D.  INDICATIONS:  The patient is a 70 year old female with positive MRI this past June of her right knee, showing a tear of the posterior horn of the medial meniscus.  Prior to getting MRI, she complained of swelling in her knee giving way, deep pain, not improving with conservative treatment, anti-inflammatories, or exercise.  She denies any specific trauma.  MRI showed tear of the posterior horn of the medial meniscus. At that time, I explained the procedure of arthroscopy and recommended surgery.  She wanted to put surgery off until this time of the year for personal reasons.  She has had intra-articular injection of cortisone into the knee, which only helped temporarily.  She has remained on anti- inflammatories on and off.  I went over the risks and imponderables of the procedure.  She appeared to understand the procedure as outlined.  DESCRIPTION OF PROCEDURE:  The patient was seen in the holding area. The right knee was identified as correct surgical site.  I placed a mark on the right knee.  She brought back to the operating room, given general anesthesia while supine.  Leg holder and tourniquet placed on right upper thigh.  She prepped and draped in usual manner.  A  generalized time-out identifying the patient Cynthia Mays and right knee for medial meniscectomy and arthroscopy.  Operating room team knew each other.  All instrumentation was properly positioned and working. Leg was then elevated, wrapped circumferentially with Esmarch bandage, tourniquet inflated to 300 mmHg.  Esmarch bandage removed.  Inflow cannula inserted.  Lactated Ringer's instilled in the knee by an infusion pump.  Arthroscope inserted laterally.  The knee was systematically examined.  Undersurface patella had mild grade 2-3 changes.  Suprapatellar pouch had mild synovitis.  Medially, there is a tear in the posterior horn of the medial meniscus and there was grade 3 changes of distal femoral condyle, proximal tibial plateau.  Anterior cruciate was intact laterally.  There are grade 2 changes, but no loose bodies, meniscus with some slight fraying but no tear.  Attention was directed back to the medial side.  Using meniscal punch and meniscal shaver, good smooth contour was obtained.  Knee systematically reexamined and no pathology found.  Wound was reapproximated using 3-0 nylon interrupted vertical mattress manner.  Marcaine 0.25% instilled to each portal for total of 30 mL.  Tourniquet deflated after 15 minutes. Sterile dressing applied.  Bulky dressing applied.  The patient go to recovery in good condition.  Physical therapy  has been set up beginning next week.  If she has any difficulties, contact me through office hospital beeper system.          ______________________________ Shela Commons. Darreld Mclean, M.D.     JWK/MEDQ  D:  07/17/2011  T:  07/17/2011  Job:  [id number]

## 2011-07-17 NOTE — Anesthesia Preprocedure Evaluation (Addendum)
Anesthesia Evaluation  Patient identified by MRN, date of birth, ID band Patient awake    Reviewed: Allergy & Precautions, H&P , NPO status , Patient's Chart, lab work & pertinent test results  History of Anesthesia Complications (+) PONV  Airway Mallampati: II      Dental  (+) Edentulous Upper and Edentulous Lower   Pulmonary former smoker   Pulmonary exam normal       Cardiovascular hypertension, Pt. on medications Regular Normal    Neuro/Psych PSYCHIATRIC DISORDERS Depression  Neuromuscular disease    GI/Hepatic GERD-  Medicated and Controlled,  Endo/Other  Diabetes mellitus-, Well Controlled, Type 2, Oral Hypoglycemic AgentsHypothyroidism   Renal/GU      Musculoskeletal  (+) Fibromyalgia -  Abdominal   Peds  Hematology   Anesthesia Other Findings   Reproductive/Obstetrics                           Anesthesia Physical Anesthesia Plan  ASA: III  Anesthesia Plan: General   Post-op Pain Management:    Induction: Intravenous, Rapid sequence and Cricoid pressure planned  Airway Management Planned: Oral ETT  Additional Equipment:   Intra-op Plan:   Post-operative Plan: Extubation in OR  Informed Consent: I have reviewed the patients History and Physical, chart, labs and discussed the procedure including the risks, benefits and alternatives for the proposed anesthesia with the patient or authorized representative who has indicated his/her understanding and acceptance.     Plan Discussed with: CRNA  Anesthesia Plan Comments:        Anesthesia Quick Evaluation

## 2011-07-21 ENCOUNTER — Ambulatory Visit (HOSPITAL_COMMUNITY)
Admission: RE | Admit: 2011-07-21 | Discharge: 2011-07-21 | Disposition: A | Discharge status: Home / Self Care | Payer: PRIVATE HEALTH INSURANCE | Source: Ambulatory Visit | Attending: Orthopaedic Surgery | Admitting: Orthopaedic Surgery

## 2011-07-21 DIAGNOSIS — E119 Type 2 diabetes mellitus without complications: Secondary | ICD-10-CM | POA: Insufficient documentation

## 2011-07-21 DIAGNOSIS — R262 Difficulty in walking, not elsewhere classified: Secondary | ICD-10-CM | POA: Insufficient documentation

## 2011-07-21 DIAGNOSIS — I10 Essential (primary) hypertension: Secondary | ICD-10-CM | POA: Insufficient documentation

## 2011-07-21 DIAGNOSIS — IMO0001 Reserved for inherently not codable concepts without codable children: Secondary | ICD-10-CM | POA: Insufficient documentation

## 2011-07-21 DIAGNOSIS — M25569 Pain in unspecified knee: Secondary | ICD-10-CM | POA: Insufficient documentation

## 2011-07-21 DIAGNOSIS — M25669 Stiffness of unspecified knee, not elsewhere classified: Secondary | ICD-10-CM | POA: Insufficient documentation

## 2011-07-21 NOTE — Patient Instructions (Addendum)
HEP

## 2011-07-21 NOTE — Progress Notes (Signed)
Physical Therapy Evaluation  Patient Details  Name: Cynthia Mays MRN: [id number] Date of Birth: 1941/02/18  Today's Date: 07/21/2011 Time: 1191-4782 Time Calculation (min): 44 min Visit#: 1  of 8   Re-eval: 08/20/11 Assessment Diagnosis: s/p arthroscopic surgery Surgical Date: 07/17/11 Next MD Visit: 07/28/11 Prior Therapy: none Charge:  eval Past Medical History:  Past Medical History  Diagnosis Date  . Hypertension   . Diabetes mellitus   . Fibromyalgia   . Hyperlipidemia   . Hypothyroidism   . Depression   . Complication of anesthesia   . PONV (postoperative nausea and vomiting)    Past Surgical History:  Past Surgical History  Procedure Date  . Carpal tunnel release 1986    bilateral  . Arthroscopy right 2002, left 2007    bilateral  . Cesarean section 1969, 1967    x2  . Back surgery 10/2007, 09/2008    x2  . Appendectomy 2002    Subjective Symptoms/Limitations Symptoms: Cynthia Mays states that she had been having knee pain late spring because she thought the knee would get better but eventually she realized that she was going to have to have surgery.  She had surgery on 07/17/2011.  She states that right now she feels a little stiff but there is no pain.  The patient states that she normally walks without a cane but she  is using one now just outside for safety.  The patient states that she is pleased  with her results.  She is now being referred  to maximize her functional potential and improve her quality of life.  How long can you sit comfortably?: The patient states that she can sit for 30 min at time without feeling like she needs to get up and move her knee. How long can you stand comfortably?: The patient states has not tried to stand yet. How long can you walk comfortably?: The patient states that the longest she has walked has been five minutes at this time. Pain Assessment Currently in Pain?: No/denies (The patient states that the last time she had  pain was Fri.) Pain Location: Knee Pain Orientation: Right Pain Type: Surgical pain  Prior Functioning  Home Living Lives With: Alone Prior Function Leisure: Hobbies-yes (Comment) Comments: gardening  Assessment RLE AROM (degrees) Right Knee Extension 0-130: 12  Right Knee Flexion 0-140: 95  RLE Strength Right Hip Flexion: 4/5 Right Hip Extension: 4/5 Right Hip ABduction: 4/5 Right Hip ADduction: 4/5 Right Knee Flexion: 3+/5 Right Knee Extension: 3+/5 Right Ankle Dorsiflexion: 5/5  Exercise/Treatments Standing Knee Flexion: AROM;Strengthening;Right;10 reps Seated Long Arc Quad: Strengthening;Right;10 reps Supine Quad Sets: 10 reps Heel Slides: 10 reps Sidelying Hip ABduction: Strengthening;10 reps Hip ADduction: Strengthening;10 reps Prone  Hip Extension: Strengthening;Right;10 reps   Physical Therapy Assessment and Plan PT Assessment and Plan Clinical Impression Statement: Pt with decreased ROM, decreased strength who will benefit from skilled physical theapy to maximize functional potential and improve her quiality of live. Rehab Potential: Good Clinical Impairments Affecting Rehab Potential: decreased ROM, weakness, decreased balance PT Frequency: Min 2X/week PT Duration: 4 weeks PT Treatment/Interventions: Gait training;Therapeutic exercise;Patient/family education PT Plan: see pt 2x week for 4 weeks.  Begin bike, functional squat, SLS,lateral step up and terminal extension next treatment.  3rd treatment add rocker board and sit to stand and forward step up.    Goals PT Short Term Goals Time to Complete Short Term Goals: 2 weeks PT Short Term Goal 1: ROM to be 0 to 105 to allow  pt to have normalized gait as well an increased comfort of sitting. PT Short Term Goal 2: Able to sit for an hour comfortably PT Short Term Goal 3: able to stand for 15-20 min to be able to make a small meal. PT Short Term Goal 4: able to walk for 15 min. to walk. PT Long Term  Goals Time to Complete Long Term Goals: 4 weeks PT Long Term Goal 1: ROM  0-120- to be able to squat PT Long Term Goal 2: Able to stand for 30 min to be able to make a meal Long Term Goal 3: able to sit comfortable for 90 min to watch a movie. Long Term Goal 4: Strength increased one grade to be able to step into truck without diffculty. PT Long Term Goal 5: Able to walk 60 min to do her own grocery shopping.l  Problem List There is no problem list on file for this patient.   PT - End of Session Activity Tolerance: Patient tolerated treatment well General Behavior During Session: [facility] for tasks performed   [name],[name] 07/21/2011, 11:56 AM  Physician Documentation Your signature is required to indicate approval of the treatment plan as stated above.  Please sign and either send electronically or make a copy of this report for your files and return this physician signed original.   Please mark one 1.__approve of plan  2. ___approve of plan with the following conditions.   ______________________________                                                          _____________________ Physician Signature                                                                                                             Date

## 2011-07-28 ENCOUNTER — Ambulatory Visit (HOSPITAL_COMMUNITY)
Admission: RE | Admit: 2011-07-28 | Discharge: 2011-07-28 | Disposition: A | Discharge status: Home / Self Care | Payer: PRIVATE HEALTH INSURANCE | Source: Ambulatory Visit | Attending: Orthopaedic Surgery | Admitting: Orthopaedic Surgery

## 2011-07-28 NOTE — Progress Notes (Signed)
Physical Therapy Treatment Patient Details  Name: Cynthia Mays MRN: [id number] Date of Birth: [birth year]  Today's Date: 07/28/2011 Time: 4098-1191 Time Calculation (min): 29 min Visit#: 2  of 8   Re-eval: 08/20/11 Charges:  therex 26'    Subjective: Symptoms/Limitations Symptoms: Pt. reports more pain in Left Knee than Right.  Pt. reports compliance with HEP. Pain Assessment Currently in Pain?: No/denies   Exercise/Treatments Recumbent bike 6'@2 .5, seat 7 Standing Lateral Step Up: 10 reps;Step Height: 4" Functional Squat: 10 reps Supine Bridges: 10 reps Straight Leg Raises: 10 reps Sidelying Hip ABduction: 15 reps Hip ADduction: 15 reps Prone  Hamstring Curl: 10 reps Hip Extension: 10 reps     Physical Therapy Assessment and Plan PT Assessment and Plan Clinical Impression Statement: Pt able to complete session without return of pain or difficulties.  Added new therex with minimal VC's. PT Plan: Add Rockerboard, sit-<>stands, and forward step ups next visit.     Problem List There is no problem list on file for this patient.   PT - End of Session Activity Tolerance: Patient tolerated treatment well General Behavior During Session: [facility] for tasks performed Cognition: [facility] for tasks performed  Emeline Gins B 07/28/2011, 3:13 PM

## 2011-07-29 ENCOUNTER — Ambulatory Visit (HOSPITAL_COMMUNITY): Payer: PRIVATE HEALTH INSURANCE | Admitting: Physical Therapy

## 2011-07-31 ENCOUNTER — Ambulatory Visit (HOSPITAL_COMMUNITY)
Admission: RE | Admit: 2011-07-31 | Discharge: 2011-07-31 | Disposition: A | Discharge status: Home / Self Care | Payer: PRIVATE HEALTH INSURANCE | Source: Ambulatory Visit | Attending: Orthopaedic Surgery | Admitting: Orthopaedic Surgery

## 2011-07-31 NOTE — Progress Notes (Signed)
Physical Therapy Treatment Patient Details  Name: Cynthia Mays MRN: [id number] Date of Birth: Apr 02, 1941  Today's Date: 07/31/2011 Time: 1340-1410 Time Calculation (min): 30 min Visit#: 3  of 8   Re-eval: 08/20/11 Charges:  therex 28'   Subjective: Symptoms/Limitations Symptoms: Doing well; having no pain today. Pain Assessment Currently in Pain?: No/denies  Exercise/Treatments Recumbent bike 6'@3 .0, seat 10 Standing Lateral Step Up: 15 reps;Step Height: 4" Forward Step Up: 15 reps;Step Height: 4" Step Down: 15 reps;Step Height: 4" Functional Squat: 15 reps Seated Other Seated Knee Exercises: sit-stands 5x no UE Supine Bridges: 15 reps Straight Leg Raises: 15 reps Sidelying Hip ABduction: 10 reps;Limitations Hip ABduction Limitations: 3# Hip ADduction: 10 reps;Limitations Hip ADduction Limitations: 3# Prone  Hamstring Curl: 10 reps;Limitations Hamstring Curl Limitations: 3# Hip Extension: 10 reps;Limitations Hip Extension Limitations: 3#     Physical Therapy Assessment and Plan PT Assessment and Plan Clinical Impression Statement: Pt. continues to do well with good form.  Returns to Dr. Hilda Lias 08/12/11; may be finished by then if continues to do well and has met all goals. PT Plan: Cont X 2 more visits and re-eval for possible discharge for 12/11 MD appt.     PT - End of Session Activity Tolerance: Patient tolerated treatment well General Behavior During Session: [facility] for tasks performed Cognition: [facility] for tasks performed  Emeline Gins B 07/31/2011, 2:16 PM

## 2011-08-05 ENCOUNTER — Ambulatory Visit (HOSPITAL_COMMUNITY)
Admission: RE | Admit: 2011-08-05 | Discharge: 2011-08-05 | Disposition: A | Discharge status: Home / Self Care | Payer: PRIVATE HEALTH INSURANCE | Source: Ambulatory Visit | Attending: Family Medicine | Admitting: Family Medicine

## 2011-08-05 DIAGNOSIS — E119 Type 2 diabetes mellitus without complications: Secondary | ICD-10-CM | POA: Insufficient documentation

## 2011-08-05 DIAGNOSIS — IMO0001 Reserved for inherently not codable concepts without codable children: Secondary | ICD-10-CM | POA: Insufficient documentation

## 2011-08-05 DIAGNOSIS — M25669 Stiffness of unspecified knee, not elsewhere classified: Secondary | ICD-10-CM | POA: Insufficient documentation

## 2011-08-05 DIAGNOSIS — M25569 Pain in unspecified knee: Secondary | ICD-10-CM | POA: Insufficient documentation

## 2011-08-05 DIAGNOSIS — I10 Essential (primary) hypertension: Secondary | ICD-10-CM | POA: Insufficient documentation

## 2011-08-05 DIAGNOSIS — R262 Difficulty in walking, not elsewhere classified: Secondary | ICD-10-CM | POA: Insufficient documentation

## 2011-08-05 NOTE — Progress Notes (Signed)
Physical Therapy Treatment Patient Details  Name: Cynthia Mays MRN: [id number] Date of Birth: 1941-08-08  Today's Date: 08/05/2011 Time: 1191-4782 Time Calculation (min): 30 min Visit#: 4  of 8   Re-eval: 08/05/11 Charges:  therex 12', MMT, ROM test    Subjective: Symptoms/Limitations Symptoms: Pt. states she is doing great! Pain Assessment Currently in Pain?: No/denies  Objective: AROM of R Knee:  0-125 degrees (equal to Left) Strength of R Knee:  All muscles 5/5 except hamstring and hip extension both 4/5 (but equal to Left)  Exercise/Treatments Stationary Bike: 6'@3 .0 seat 10 Standing Lateral Step Up: Step Height: 6";10 reps Forward Step Up: Step Height: 6";10 reps Step Down: Step Height: 6";10 reps  Physical Therapy Assessment and Plan PT Assessment and Plan Clinical Impression Statement: Pt. has met/exceeded goals.  Pt. has returned to prior activity level without pain or difficulty.  Pt. given HEP to do to maintain LE strength.  Pt. did not have any questions regarding her therapy. PT Plan: Recommend discharge to evaluating PT as pt has met all goals.    Goals Home Exercise Program Pt will Perform Home Exercise Program: Independently PT Goal: Perform Home Exercise Program - Progress: Met PT Short Term Goals PT Short Term Goal 1 - Progress: Met PT Short Term Goal 2 - Progress: Met PT Short Term Goal 3 - Progress: Met PT Long Term Goals PT Long Term Goal 1 - Progress: Met PT Long Term Goal 2 - Progress: Met Long Term Goal 3 Progress: Met Long Term Goal 5 Progress: Met   PT - End of Session Activity Tolerance: Patient tolerated treatment well General Behavior During Session: [facility] for tasks performed Cognition: [facility] for tasks performed  Bascom Levels, Amy B 08/05/2011, 10:51 AM

## 2011-08-07 ENCOUNTER — Ambulatory Visit (HOSPITAL_COMMUNITY): Payer: PRIVATE HEALTH INSURANCE | Admitting: *Deleted

## 2011-08-12 ENCOUNTER — Ambulatory Visit (HOSPITAL_COMMUNITY): Payer: PRIVATE HEALTH INSURANCE | Admitting: *Deleted

## 2011-08-14 ENCOUNTER — Ambulatory Visit (HOSPITAL_COMMUNITY): Payer: PRIVATE HEALTH INSURANCE | Admitting: Physical Therapy

## 2011-12-12 ENCOUNTER — Other Ambulatory Visit: Payer: Self-pay | Admitting: Obstetrics & Gynecology

## 2012-02-23 ENCOUNTER — Ambulatory Visit: Payer: PRIVATE HEALTH INSURANCE | Attending: Neurosurgery | Admitting: Physical Therapy

## 2012-02-23 DIAGNOSIS — IMO0001 Reserved for inherently not codable concepts without codable children: Secondary | ICD-10-CM | POA: Insufficient documentation

## 2012-02-23 DIAGNOSIS — M545 Low back pain, unspecified: Secondary | ICD-10-CM | POA: Insufficient documentation

## 2012-02-23 DIAGNOSIS — R5381 Other malaise: Secondary | ICD-10-CM | POA: Insufficient documentation

## 2012-02-25 ENCOUNTER — Ambulatory Visit: Payer: PRIVATE HEALTH INSURANCE | Admitting: Physical Therapy

## 2012-02-27 ENCOUNTER — Ambulatory Visit: Payer: PRIVATE HEALTH INSURANCE | Admitting: *Deleted

## 2012-03-01 ENCOUNTER — Encounter: Payer: PRIVATE HEALTH INSURANCE | Admitting: Physical Therapy

## 2012-03-02 ENCOUNTER — Encounter: Payer: PRIVATE HEALTH INSURANCE | Admitting: Physical Therapy

## 2012-06-03 ENCOUNTER — Other Ambulatory Visit: Payer: Self-pay | Admitting: Obstetrics & Gynecology

## 2012-06-03 DIAGNOSIS — Z139 Encounter for screening, unspecified: Secondary | ICD-10-CM

## 2012-06-04 ENCOUNTER — Encounter (HOSPITAL_COMMUNITY): Payer: Self-pay | Admitting: *Deleted

## 2012-06-04 ENCOUNTER — Emergency Department (HOSPITAL_COMMUNITY): Payer: PRIVATE HEALTH INSURANCE

## 2012-06-04 ENCOUNTER — Emergency Department (HOSPITAL_COMMUNITY)
Admission: EM | Admit: 2012-06-04 | Discharge: 2012-06-05 | Disposition: A | Discharge status: Home / Self Care | Payer: PRIVATE HEALTH INSURANCE | Attending: Emergency Medicine | Admitting: Emergency Medicine

## 2012-06-04 DIAGNOSIS — E039 Hypothyroidism, unspecified: Secondary | ICD-10-CM | POA: Insufficient documentation

## 2012-06-04 DIAGNOSIS — S161XXA Strain of muscle, fascia and tendon at neck level, initial encounter: Secondary | ICD-10-CM

## 2012-06-04 DIAGNOSIS — I10 Essential (primary) hypertension: Secondary | ICD-10-CM | POA: Insufficient documentation

## 2012-06-04 DIAGNOSIS — F329 Major depressive disorder, single episode, unspecified: Secondary | ICD-10-CM | POA: Insufficient documentation

## 2012-06-04 DIAGNOSIS — S0990XA Unspecified injury of head, initial encounter: Secondary | ICD-10-CM

## 2012-06-04 DIAGNOSIS — Q759 Congenital malformation of skull and face bones, unspecified: Secondary | ICD-10-CM | POA: Insufficient documentation

## 2012-06-04 DIAGNOSIS — F3289 Other specified depressive episodes: Secondary | ICD-10-CM | POA: Insufficient documentation

## 2012-06-04 DIAGNOSIS — E119 Type 2 diabetes mellitus without complications: Secondary | ICD-10-CM | POA: Insufficient documentation

## 2012-06-04 DIAGNOSIS — S51809A Unspecified open wound of unspecified forearm, initial encounter: Secondary | ICD-10-CM | POA: Insufficient documentation

## 2012-06-04 DIAGNOSIS — M545 Low back pain, unspecified: Secondary | ICD-10-CM | POA: Insufficient documentation

## 2012-06-04 DIAGNOSIS — W11XXXA Fall on and from ladder, initial encounter: Secondary | ICD-10-CM | POA: Insufficient documentation

## 2012-06-04 DIAGNOSIS — W19XXXA Unspecified fall, initial encounter: Secondary | ICD-10-CM

## 2012-06-04 DIAGNOSIS — Z79899 Other long term (current) drug therapy: Secondary | ICD-10-CM | POA: Insufficient documentation

## 2012-06-04 DIAGNOSIS — S300XXA Contusion of lower back and pelvis, initial encounter: Secondary | ICD-10-CM

## 2012-06-04 DIAGNOSIS — E785 Hyperlipidemia, unspecified: Secondary | ICD-10-CM | POA: Insufficient documentation

## 2012-06-04 DIAGNOSIS — S139XXA Sprain of joints and ligaments of unspecified parts of neck, initial encounter: Secondary | ICD-10-CM | POA: Insufficient documentation

## 2012-06-04 DIAGNOSIS — R1033 Periumbilical pain: Secondary | ICD-10-CM | POA: Insufficient documentation

## 2012-06-04 DIAGNOSIS — S301XXA Contusion of abdominal wall, initial encounter: Secondary | ICD-10-CM

## 2012-06-04 DIAGNOSIS — Z7982 Long term (current) use of aspirin: Secondary | ICD-10-CM | POA: Insufficient documentation

## 2012-06-04 DIAGNOSIS — M25559 Pain in unspecified hip: Secondary | ICD-10-CM | POA: Insufficient documentation

## 2012-06-04 LAB — CBC WITH DIFFERENTIAL/PLATELET
Basophils Absolute: 0 10*3/uL (ref 0.0–0.1)
Basophils Relative: 0 % (ref 0–1)
Eosinophils Absolute: 0.3 10*3/uL (ref 0.0–0.7)
Eosinophils Relative: 3 % (ref 0–5)
HCT: 36.2 % (ref 36.0–46.0)
Hemoglobin: 12.4 g/dL (ref 12.0–15.0)
Lymphocytes Relative: 20 % (ref 12–46)
Lymphs Abs: 1.5 10*3/uL (ref 0.7–4.0)
MCH: 30.4 pg (ref 26.0–34.0)
MCHC: 34.3 g/dL (ref 30.0–36.0)
MCV: 88.7 fL (ref 78.0–100.0)
Monocytes Absolute: 0.5 10*3/uL (ref 0.1–1.0)
Monocytes Relative: 6 % (ref 3–12)
Neutro Abs: 5.5 10*3/uL (ref 1.7–7.7)
Neutrophils Relative %: 71 % (ref 43–77)
Platelets: 163 10*3/uL (ref 150–400)
RBC: 4.08 MIL/uL (ref 3.87–5.11)
RDW: 13.3 % (ref 11.5–15.5)
WBC: 7.7 10*3/uL (ref 4.0–10.5)

## 2012-06-04 LAB — BASIC METABOLIC PANEL
BUN: 21 mg/dL (ref 6–23)
CO2: 27 mEq/L (ref 19–32)
Calcium: 9 mg/dL (ref 8.4–10.5)
Chloride: 104 mEq/L (ref 96–112)
Creatinine, Ser: 0.94 mg/dL (ref 0.50–1.10)
GFR calc Af Amer: 70 mL/min — ABNORMAL LOW (ref >90)
GFR calc non Af Amer: 60 mL/min — ABNORMAL LOW (ref >90)
Glucose, Bld: 121 mg/dL — ABNORMAL HIGH (ref 70–99)
Potassium: 3.4 mEq/L — ABNORMAL LOW (ref 3.5–5.1)
Sodium: 141 mEq/L (ref 135–145)

## 2012-06-04 MED ORDER — SODIUM CHLORIDE 0.9 % IV BOLUS (SEPSIS)
1000.0000 mL | Freq: Once | INTRAVENOUS | Status: AC
  Administered 2012-06-04: 1000 mL via INTRAVENOUS

## 2012-06-04 MED ORDER — IOHEXOL 300 MG/ML  SOLN
100.0000 mL | Freq: Once | INTRAMUSCULAR | Status: AC | PRN
  Administered 2012-06-04: 100 mL via INTRAVENOUS

## 2012-06-04 MED ORDER — HYDROCODONE-ACETAMINOPHEN 5-500 MG PO TABS: 1.0000 | ORAL_TABLET | Freq: Four times a day (QID) | ORAL | Status: DC | PRN

## 2012-06-04 MED ORDER — MORPHINE SULFATE 4 MG/ML IJ SOLN
4.0000 mg | Freq: Once | INTRAMUSCULAR | Status: AC
  Administered 2012-06-04: 4 mg via INTRAVENOUS
  Filled 2012-06-04: qty 1

## 2012-06-04 NOTE — ED Provider Notes (Signed)
History   This chart was scribed for Cynthia Lyons, MD by Toya Smothers. The patient was seen in room APA10/APA10. Patient's care was started at 2022.  CSN: [id number]  Arrival date & time 06/04/12  2022   First MD Initiated Contact with Patient 06/04/12 2046      Chief Complaint  Patient presents with  . Fall   Patient is a 71 y.o. female presenting with fall. The history is provided by the patient. No language interpreter was used.  Fall The accident occurred less than 1 hour ago. The fall occurred from a ladder. She fell from a height of 6 to 10 ft. Impact surface: metal ladder. The volume of blood lost was minimal. The point of impact was the head, right hip and left hip. The pain is present in the left hip (lower back). The pain is at a severity of 10/10. The pain is severe. She was ambulatory at the scene. There was no entrapment after the fall. Associated symptoms include abdominal pain. Pertinent negatives include no visual change, no fever, no numbness, no bowel incontinence, no nausea, no vomiting, no hematuria, no headaches, no hearing loss, no loss of consciousness and no tingling. The symptoms are aggravated by pressure on the injury. Treatment on scene includes a c-collar. Treatments tried: c-collar. The treatment provided mild relief.    Cynthia Mays is a 71 y.o. female who presents to the Emergency Department complaining of 1 hour sudden onset severe constant coccyx area pain and lower back pain as the result of a fall. Pain is described as soreness, localized, aggravated with palpation, and alleviated with rest. Pt reports that fell approximately 10 feet from a ladder onto the collapsed metal ladder frame. Points of impact include coccyx region, cephalic area, and lower black. Minor laceration sustain to forearms. Minimal bleeding controlled PTA. Pt was ambulatory with help after fall and reports to ED wearing a c-collar. Denis chest pain, neck pain, LOC, cardiac stent  placement, abdominal pain. Pertinent history includes diabetes mellitus, HTN, and fibromyalgia.    Past Medical History  Diagnosis Date  . Hypertension   . Diabetes mellitus   . Fibromyalgia   . Hyperlipidemia   . Hypothyroidism   . Depression   . Complication of anesthesia   . PONV (postoperative nausea and vomiting)     Past Surgical History  Procedure Date  . Carpal tunnel release 1986    bilateral  . Arthroscopy right 2002, left 2007    bilateral  . Cesarean section 1969, 1967    x2  . Back surgery 10/2007, 09/2008    x2  . Appendectomy 2002    Family History  Problem Relation Age of Onset  . Anesthesia problems Mother   . Hypotension Neg Hx   . Pseudochol deficiency Neg Hx   . Malignant hyperthermia Neg Hx     History  Substance Use Topics  . Smoking status: Never Smoker   . Smokeless tobacco: Not on file  . Alcohol Use: No     occasionally wine   Review of Systems  Constitutional: Negative for fever.  HENT: Negative for neck stiffness.   Gastrointestinal: Positive for abdominal pain. Negative for nausea, vomiting and bowel incontinence.  Genitourinary: Negative for hematuria.  Musculoskeletal: Positive for back pain.  Skin: Positive for wound.  Neurological: Negative for dizziness, tingling, loss of consciousness, syncope, numbness and headaches.  All other systems reviewed and are negative.    Allergies  Codeine and Penicillins  Home Medications  Current Outpatient Rx  Name Route Sig Dispense Refill  . ASPIRIN EC 81 MG PO TBEC Oral Take 81 mg by mouth daily.      Marland Kitchen ESOMEPRAZOLE MAGNESIUM 20 MG PO CPDR Oral Take 20 mg by mouth daily before breakfast.      . FLUTICASONE PROPIONATE 50 MCG/ACT NA SUSP Nasal Place 2 sprays into the nose daily.      Marland Kitchen HYDROCHLOROTHIAZIDE 25 MG PO TABS Oral Take 25 mg by mouth daily.      Marland Kitchen LISINOPRIL 10 MG PO TABS Oral Take 10 mg by mouth daily.      Marland Kitchen METFORMIN HCL 500 MG PO TABS Oral Take 500 mg by mouth 2 (two)  times daily with a meal.      . THERA M PLUS PO TABS Oral Take 1 tablet by mouth daily.      Marland Kitchen NIFEDIPINE ER OSMOTIC 60 MG PO TB24 Oral Take 60 mg by mouth daily.      Marland Kitchen ROSUVASTATIN CALCIUM 40 MG PO TABS Oral Take 40 mg by mouth at bedtime.      . SERTRALINE HCL 50 MG PO TABS Oral Take 50 mg by mouth daily.      . TRAMADOL HCL 50 MG PO TABS Oral Take 1 tablet (50 mg total) by mouth every 6 (six) hours as needed for pain (10 days between refills). Maximum dose= 8 tablets per day 60 tablet 2    BP 136/64  Pulse 56  Temp 98.3 F (36.8 C) (Oral)  Resp 18  Ht 4\' 11"  (1.499 m)  Wt 199 lb (90.266 kg)  BMI 40.19 kg/m2  SpO2 96%  Physical Exam  HENT:  Head: Atraumatic. Macrocephalic.  Right Ear: Tympanic membrane normal.  Left Ear: Tympanic membrane normal.  Mouth/Throat: Oropharynx is clear and moist. No oropharyngeal exudate.  Eyes: EOM are normal. Pupils are equal, round, and reactive to light.  Neck: Neck supple. No tracheal deviation present.  Cardiovascular: Normal rate, regular rhythm and normal heart sounds.   No murmur heard. Pulmonary/Chest: Effort normal and breath sounds normal. No respiratory distress. She has no wheezes.  Abdominal: There is no rebound and no guarding.       Tenderness in the periumbilical region.  Musculoskeletal:       No C-spine tenderness or stepp-offs. Tenderness to palpation in the sacral region. Bruising to right buttocks. Pelvis is stable.  Lymphadenopathy:    She has no cervical adenopathy.  Neurological: She is alert.  Skin: Skin is warm. No rash noted. No erythema.    ED Course  Procedures DIAGNOSTIC STUDIES: Oxygen Saturation is 96% on room air, adequate by my interpretation.    COORDINATION OF CARE: 20:53- Evaluate Pt. Pt is awake and alert.   Labs Reviewed  BASIC METABOLIC PANEL - Abnormal; Notable for the following:    Potassium 3.4 (*)     Glucose, Bld 121 (*)     GFR calc non Af Amer 60 (*)     GFR calc Af Amer 70 (*)      All other components within normal limits  CBC WITH DIFFERENTIAL   Dg Chest 1 View  06/04/2012  *RADIOLOGY REPORT*  Clinical Data: Fall from ladder.  Hypertension.  Diabetes.  CHEST - 1 VIEW  Comparison: Multiple exams, including 02/26/2011  Findings: Apical lordotic projection noted.  This and the AP technique exaggerate heart size; overall no cardiomegaly is thought to be present.  There is mild tortuosity of the thoracic aorta. Asymmetric degenerative  glenohumeral arthropathy noted on the right.  Thoracic spondylosis is present.  No pneumothorax or pleural effusion observed.  IMPRESSION:  1.  Tortuous aorta. 2.  Asymmetric degenerative glenohumeral arthropathy on the right.   Original Report Authenticated By: Dellia Cloud, M.D.    Ct Head Wo Contrast  06/04/2012  *RADIOLOGY REPORT*  Clinical Data:  Fall, hit head.  Headache, neck pain.  CT HEAD WITHOUT CONTRAST CT CERVICAL SPINE WITHOUT CONTRAST  Technique:  Multidetector CT imaging of the head and cervical spine was performed following the standard protocol without intravenous contrast.  Multiplanar CT image reconstructions of the cervical spine were also generated.  Comparison:  None.  CT HEAD  Findings: Mild chronic small vessel disease changes throughout the deep white matter. No acute intracranial abnormality. Specifically, no hemorrhage, hydrocephalus, mass lesion, acute infarction, or significant intracranial injury.  No acute calvarial abnormality. Visualized paranasal sinuses and mastoids clear. Orbital soft tissues unremarkable.  IMPRESSION: No acute intracranial abnormality.  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  Prevertebral soft tissues are normal.  No fracture.  No epidural or paraspinal hematoma.  IMPRESSION: Diffuse degenerative changes.  No acute bony abnormality.   Original Report Authenticated By: Cyndie Chime, M.D.    Ct Cervical Spine Wo  Contrast  06/04/2012  *RADIOLOGY REPORT*  Clinical Data:  Fall, hit head.  Headache, neck pain.  CT HEAD WITHOUT CONTRAST CT CERVICAL SPINE WITHOUT CONTRAST  Technique:  Multidetector CT imaging of the head and cervical spine was performed following the standard protocol without intravenous contrast.  Multiplanar CT image reconstructions of the cervical spine were also generated.  Comparison:  None.  CT HEAD  Findings: Mild chronic small vessel disease changes throughout the deep white matter. No acute intracranial abnormality. Specifically, no hemorrhage, hydrocephalus, mass lesion, acute infarction, or significant intracranial injury.  No acute calvarial abnormality. Visualized paranasal sinuses and mastoids clear. Orbital soft tissues unremarkable.  IMPRESSION: No acute intracranial abnormality.  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  Prevertebral soft tissues are normal.  No fracture.  No epidural or paraspinal hematoma.  IMPRESSION: Diffuse degenerative changes.  No acute bony abnormality.   Original Report Authenticated By: Cyndie Chime, M.D.    Ct Abdomen Pelvis W Contrast  06/04/2012  *RADIOLOGY REPORT*  Clinical Data: Low back pain.  Fell from a ladder today.  CT ABDOMEN AND PELVIS WITH CONTRAST  Technique:  Multidetector CT imaging of the abdomen and pelvis was performed following the standard protocol during bolus administration of intravenous contrast.  Contrast: OMNIPAQUE IOHEXOL 300 MG/ML  SOLN  Comparison: Lumbar spine radiographs dated 02/03/2012.  Findings: Stable interbody bone plug and pedicle screw and rod fixation at the L3-L5 levels with normal alignment.  Marked disc space narrowing with a vacuum phenomenon, discogenic sclerosis, Schmorl's node formation and spur formation at the L5-S1 level. Similar changes, to a lesser degree, at the T12-L1 level. Additional lower thoracic spine degenerative changes.  No  spine fractures or subluxations are seen.  Clear lung bases.  Small liver and right renal cysts.  Unremarkable spleen, pancreas, gallbladder, adrenal glands, left kidney and urinary bladder.  Calcified uterine fibroids.  Normal appearing ovaries.  Bilateral inguinal hernias containing fat.  No gastrointestinal abnormalities or enlarged lymph nodes.  No evidence of appendicitis.  Previous appendectomy by history.  IMPRESSION:  1.  No acute abnormality. 2.  Lumbar spine postsurgical  and degenerative changes. 3.  Calcified uterine fibroids. 4.  Bilateral inguinal hernias containing fat.   Original Report Authenticated By: Darrol Angel, M.D.      No diagnosis found.    MDM  The patient presents here after a fall from a ladder while cleaning out gutters.  She is somewhat of a difficult historian, but it appears as though the worst discomfort is in her buttock and tailbone area.  As she could not remember a lot of the incident a ct of the head and neck were performed and were unremarkable.  A ct of the abdomen and pelvis were performed which did not reveal any emergent intra-abdominal injury.  At this point she will be discharged to home with pain medications, rest, and time.      I personally performed the services described in this documentation, which was scribed in my presence. The recorded information has been reviewed and considered.      Cynthia Lyons, MD 06/04/12 2326

## 2012-06-04 NOTE — ED Notes (Signed)
Fell app 7 feet off ladder, landing on ladder on ground,  Rt arm pain, struck back head, No LOC, low back pain,, neck pain, lt hand pain,  Lac to rt elbow

## 2012-06-04 NOTE — ED Notes (Signed)
Patient ambulatory to restroom assisted by daughter.

## 2012-06-07 ENCOUNTER — Ambulatory Visit (HOSPITAL_COMMUNITY): Payer: PRIVATE HEALTH INSURANCE

## 2012-06-14 ENCOUNTER — Ambulatory Visit (HOSPITAL_COMMUNITY)
Admission: RE | Admit: 2012-06-14 | Discharge: 2012-06-14 | Disposition: A | Discharge status: Home / Self Care | Payer: PRIVATE HEALTH INSURANCE | Source: Ambulatory Visit | Attending: Obstetrics & Gynecology | Admitting: Obstetrics & Gynecology

## 2012-06-14 DIAGNOSIS — Z139 Encounter for screening, unspecified: Secondary | ICD-10-CM

## 2012-06-14 DIAGNOSIS — Z1231 Encounter for screening mammogram for malignant neoplasm of breast: Secondary | ICD-10-CM | POA: Insufficient documentation

## 2012-07-13 ENCOUNTER — Other Ambulatory Visit: Payer: Self-pay | Admitting: Obstetrics & Gynecology

## 2012-07-13 ENCOUNTER — Other Ambulatory Visit (HOSPITAL_COMMUNITY)
Admission: RE | Admit: 2012-07-13 | Discharge: 2012-07-13 | Disposition: A | Discharge status: Home / Self Care | Payer: PRIVATE HEALTH INSURANCE | Source: Ambulatory Visit | Attending: Obstetrics & Gynecology | Admitting: Obstetrics & Gynecology

## 2012-07-13 DIAGNOSIS — Z124 Encounter for screening for malignant neoplasm of cervix: Secondary | ICD-10-CM | POA: Insufficient documentation

## 2012-09-06 ENCOUNTER — Other Ambulatory Visit (HOSPITAL_COMMUNITY): Payer: Self-pay | Admitting: Neurosurgery

## 2013-01-12 ENCOUNTER — Encounter: Payer: Self-pay | Admitting: Cardiology

## 2013-01-12 ENCOUNTER — Ambulatory Visit (INDEPENDENT_AMBULATORY_CARE_PROVIDER_SITE_OTHER): Payer: Medicaid Other | Admitting: Cardiology

## 2013-01-12 VITALS — BP 105/69 | HR 81 | Ht 59.0 in | Wt 190.0 lb

## 2013-01-12 DIAGNOSIS — R072 Precordial pain: Secondary | ICD-10-CM

## 2013-01-12 DIAGNOSIS — R0989 Other specified symptoms and signs involving the circulatory and respiratory systems: Secondary | ICD-10-CM

## 2013-01-12 DIAGNOSIS — R079 Chest pain, unspecified: Secondary | ICD-10-CM

## 2013-01-12 NOTE — Progress Notes (Signed)
HPI The patient presents for evaluation of chest discomfort. She's been getting discomfort in her left upper epigastric area for lower chest for about 6 months. She's been treated for indigestion with Nexium which may or may not help. She'll take Alka-Seltzer without significant improvement. She might associated with certain foods but not with activities. She says she throws up and she will sometimes feel better. She says she's active and this includes yard work and walking.  She says that she cannot bring on this discomfort with activity. She doesn't report any shortness of breath, PND or orthopnea. She doesn't describe palpitations, presyncope or syncope. She has an occasional dizziness. She does hear a "swishing" in her years when she lays down at night.  Allergies  Allergen Reactions  . Codeine     Headache, gi upset  . Penicillins Rash    Current Outpatient Prescriptions  Medication Sig Dispense Refill  . aspirin EC 81 MG tablet Take 81 mg by mouth daily.        . diclofenac (VOLTAREN) 75 MG EC tablet       . doxycycline (VIBRA-TABS) 100 MG tablet       . esomeprazole (NEXIUM) 20 MG capsule Take 20 mg by mouth daily before breakfast.        . fluticasone (FLONASE) 50 MCG/ACT nasal spray Place 2 sprays into the nose daily.        . hydrochlorothiazide (HYDRODIURIL) 25 MG tablet Take 25 mg by mouth daily.        Marland Kitchen HYDROcodone-acetaminophen (VICODIN) 5-500 MG per tablet Take 1-2 tablets by mouth every 6 (six) hours as needed for pain.  20 tablet  0  . levothyroxine (SYNTHROID, LEVOTHROID) 75 MCG tablet       . lisinopril (PRINIVIL,ZESTRIL) 10 MG tablet Take 10 mg by mouth daily.        . metFORMIN (GLUCOPHAGE) 500 MG tablet Take 500 mg by mouth 2 (two) times daily with a meal.        . Multiple Vitamins-Minerals (MULTIVITAMINS THER. W/MINERALS) TABS Take 1 tablet by mouth daily.        Marland Kitchen NIFEdipine (PROCARDIA XL/ADALAT-CC) 60 MG 24 hr tablet Take 60 mg by mouth daily.        .  rosuvastatin (CRESTOR) 40 MG tablet Take 40 mg by mouth at bedtime.        . sertraline (ZOLOFT) 50 MG tablet Take 50 mg by mouth daily.         No current facility-administered medications for this visit.    Past Medical History  Diagnosis Date  . Hypertension   . Diabetes mellitus   . Fibromyalgia   . Hyperlipidemia   . Hypothyroidism   . Depression   . Complication of anesthesia   . PONV (postoperative nausea and vomiting)     Past Surgical History  Procedure Laterality Date  . Carpal tunnel release  1986    bilateral  . Arthroscopy  right 2002, left 2007    bilateral  . Cesarean section  1969, 1967    x2  . Back surgery  10/2007, 09/2008    x2  . Appendectomy  2002    Family History  Problem Relation Age of Onset  . Anesthesia problems Mother   . Hypotension Neg Hx   . Pseudochol deficiency Neg Hx   . Malignant hyperthermia Neg Hx     History   Social History  . Marital Status: Widowed    Spouse Name: N/A  Number of Children: N/A  . Years of Education: N/A   Occupational History  . Not on file.   Social History Main Topics  . Smoking status: Never Smoker   . Smokeless tobacco: Not on file  . Alcohol Use: No     Comment: occasionally wine  . Drug Use: No  . Sexually Active: Yes    Birth Control/ Protection: Post-menopausal   Other Topics Concern  . Not on file   Social History Narrative  . No narrative on file    ROS:  Positive for sinus trouble, reflux, dry, joint pains, ankle edema. Otherwise as stated in the history of present illness and negative for all other systems.  PHYSICAL EXAM BP 105/69  Pulse 81  Ht 4\' 11"  (1.499 m)  Wt 190 lb (86.183 kg)  BMI 38.35 kg/m2 GENERAL:  Well appearing HEENT:  Pupils equal round and reactive, fundi not visualized, oral mucosa unremarkable NECK:  No jugular venous distention, waveform within normal limits, carotid upstroke brisk and symmetric, possible soft carotid bruits, no thyromegaly LYMPHATICS:   No cervical, inguinal adenopathy LUNGS:  Clear to auscultation bilaterally BACK:  No CVA tenderness CHEST:  Unremarkable HEART:  PMI not displaced or sustained,S1 and S2 within normal limits, no S3, no S4, no clicks, no rubs, no murmurs ABD:  Flat, positive bowel sounds normal in frequency in pitch, no bruits, no rebound, no guarding, no midline pulsatile mass, no hepatomegaly, no splenomegaly EXT:  2 plus pulses throughout, no edema, no cyanosis no clubbing SKIN:  No rashes no nodules NEURO:  Cranial nerves II through XII grossly intact, motor grossly intact throughout PSYCH:  Cognitively intact, oriented to person place and time   EKG:  Sinus rhythm, rate 82, left axis deviation, left anterior fascicular block, poor anterior R wave progression, premature atrial contractions. 01/12/2013   ASSESSMENT AND PLAN  CHEST PAIN:  Her pain is predominantly atypical. However, with significant risk factors she needs stress testing. I'm going to try to perform an exercise treadmill test. However, with her joint problems and she can't accomplish this she'll need Lexiscan Myoview.  BRUIT:  She has a questionable bruit. She will get carotid Dopplers.  HTN:  The blood pressure is at target. No change in medications is indicated. We will continue with therapeutic lifestyle changes (TLC).

## 2013-01-12 NOTE — Patient Instructions (Addendum)
The current medical regimen is effective;  continue present plan and medications.  Your physician has requested that you have an exercise tolerance test. For further information please visit [email or web address] Please also follow instruction sheet, as given.  Your physician has requested that you have a carotid duplex. This test is an ultrasound of the carotid arteries in your neck. It looks at blood flow through these arteries that supply the brain with blood. Allow one hour for this exam. There are no restrictions or special instructions.  Follow up will be based on these results.

## 2013-01-15 DIAGNOSIS — R072 Precordial pain: Secondary | ICD-10-CM | POA: Insufficient documentation

## 2013-02-14 ENCOUNTER — Ambulatory Visit (INDEPENDENT_AMBULATORY_CARE_PROVIDER_SITE_OTHER): Payer: Medicaid Other | Admitting: Physician Assistant

## 2013-02-14 ENCOUNTER — Encounter (INDEPENDENT_AMBULATORY_CARE_PROVIDER_SITE_OTHER): Payer: Medicaid Other

## 2013-02-14 DIAGNOSIS — R0989 Other specified symptoms and signs involving the circulatory and respiratory systems: Secondary | ICD-10-CM

## 2013-02-14 DIAGNOSIS — R079 Chest pain, unspecified: Secondary | ICD-10-CM

## 2013-02-14 NOTE — Progress Notes (Signed)
Exercise Treadmill Test  Pre-Exercise Testing Evaluation Rhythm: sinus bradycardia  Rate: 54     Test  Exercise Tolerance Test Ordering MD: Angelina Sheriff, MD  Interpreting MD: Tereso Newcomer, PA-C  Unique Test No: 1  Treadmill:  1  Indication for ETT: chest pain - rule out ischemia  Contraindication to ETT: No   Stress Modality: exercise - treadmill  Cardiac Imaging Performed: non   Protocol: standard Bruce - maximal  Max BP:  167/65  Max MPHR (bpm):  150 85% MPR (bpm):  128  MPHR obtained (bpm):  136 % MPHR obtained:  91  Reached 85% MPHR (min:sec):  2:00 Total Exercise Time (min-sec):  3:00  Workload in METS:  4.6 Borg Scale: 19  Reason ETT Terminated:  fatigue    ST Segment Analysis At Rest: normal ST segments - no evidence of significant ST depression With Exercise: non-specific ST changes  Other Information Arrhythmia:  No Angina during ETT:  absent (0) Quality of ETT:  diagnostic  ETT Interpretation:  normal - no evidence of ischemia by ST analysis  Comments: Poor exercise tolerance.   No chest pain.  She did c/o dyspnea.   Normal BP response to exercise. No significant ST-T changes to suggest ischemia.   Recommendations: F/u with Dr. Rollene Rotunda as directed. Signed,  Tereso Newcomer, PA-C   02/14/2013 11:12 AM

## 2013-04-16 ENCOUNTER — Emergency Department (HOSPITAL_COMMUNITY)
Admission: EM | Admit: 2013-04-16 | Discharge: 2013-04-16 | Disposition: A | Discharge status: Home / Self Care | Payer: PRIVATE HEALTH INSURANCE | Attending: Emergency Medicine | Admitting: Emergency Medicine

## 2013-04-16 ENCOUNTER — Emergency Department (HOSPITAL_COMMUNITY): Payer: PRIVATE HEALTH INSURANCE

## 2013-04-16 ENCOUNTER — Encounter (HOSPITAL_COMMUNITY): Payer: Self-pay | Admitting: Emergency Medicine

## 2013-04-16 DIAGNOSIS — I10 Essential (primary) hypertension: Secondary | ICD-10-CM | POA: Insufficient documentation

## 2013-04-16 DIAGNOSIS — E119 Type 2 diabetes mellitus without complications: Secondary | ICD-10-CM | POA: Insufficient documentation

## 2013-04-16 DIAGNOSIS — Z79899 Other long term (current) drug therapy: Secondary | ICD-10-CM | POA: Insufficient documentation

## 2013-04-16 DIAGNOSIS — J3489 Other specified disorders of nose and nasal sinuses: Secondary | ICD-10-CM | POA: Insufficient documentation

## 2013-04-16 DIAGNOSIS — S199XXA Unspecified injury of neck, initial encounter: Secondary | ICD-10-CM | POA: Insufficient documentation

## 2013-04-16 DIAGNOSIS — F329 Major depressive disorder, single episode, unspecified: Secondary | ICD-10-CM | POA: Insufficient documentation

## 2013-04-16 DIAGNOSIS — R05 Cough: Secondary | ICD-10-CM | POA: Insufficient documentation

## 2013-04-16 DIAGNOSIS — Z9889 Other specified postprocedural states: Secondary | ICD-10-CM | POA: Insufficient documentation

## 2013-04-16 DIAGNOSIS — S93409A Sprain of unspecified ligament of unspecified ankle, initial encounter: Secondary | ICD-10-CM | POA: Insufficient documentation

## 2013-04-16 DIAGNOSIS — Y929 Unspecified place or not applicable: Secondary | ICD-10-CM | POA: Insufficient documentation

## 2013-04-16 DIAGNOSIS — Z880 Allergy status to penicillin: Secondary | ICD-10-CM | POA: Insufficient documentation

## 2013-04-16 DIAGNOSIS — S0993XA Unspecified injury of face, initial encounter: Secondary | ICD-10-CM | POA: Insufficient documentation

## 2013-04-16 DIAGNOSIS — Z7982 Long term (current) use of aspirin: Secondary | ICD-10-CM | POA: Insufficient documentation

## 2013-04-16 DIAGNOSIS — Y9389 Activity, other specified: Secondary | ICD-10-CM | POA: Insufficient documentation

## 2013-04-16 DIAGNOSIS — E785 Hyperlipidemia, unspecified: Secondary | ICD-10-CM | POA: Insufficient documentation

## 2013-04-16 DIAGNOSIS — IMO0002 Reserved for concepts with insufficient information to code with codable children: Secondary | ICD-10-CM | POA: Insufficient documentation

## 2013-04-16 DIAGNOSIS — R296 Repeated falls: Secondary | ICD-10-CM | POA: Insufficient documentation

## 2013-04-16 DIAGNOSIS — S93402A Sprain of unspecified ligament of left ankle, initial encounter: Secondary | ICD-10-CM

## 2013-04-16 DIAGNOSIS — F3289 Other specified depressive episodes: Secondary | ICD-10-CM | POA: Insufficient documentation

## 2013-04-16 DIAGNOSIS — R209 Unspecified disturbances of skin sensation: Secondary | ICD-10-CM | POA: Insufficient documentation

## 2013-04-16 DIAGNOSIS — E039 Hypothyroidism, unspecified: Secondary | ICD-10-CM | POA: Insufficient documentation

## 2013-04-16 DIAGNOSIS — R059 Cough, unspecified: Secondary | ICD-10-CM | POA: Insufficient documentation

## 2013-04-16 DIAGNOSIS — Z87891 Personal history of nicotine dependence: Secondary | ICD-10-CM | POA: Insufficient documentation

## 2013-04-16 DIAGNOSIS — IMO0001 Reserved for inherently not codable concepts without codable children: Secondary | ICD-10-CM | POA: Insufficient documentation

## 2013-04-16 MED ORDER — HYDROCODONE-ACETAMINOPHEN 5-325 MG PO TABS: 1.0000 | ORAL_TABLET | Freq: Four times a day (QID) | ORAL | Status: DC | PRN

## 2013-04-16 NOTE — ED Notes (Signed)
Pt reports slipping and falling this morning at 1:00 am, pt did not hit head and no LOC. Pt reports having left ankle pain at this time.

## 2013-04-16 NOTE — ED Provider Notes (Signed)
CSN: [id number]     Arrival date & time 04/16/13  0932 History    This chart was scribed for Shelda Jakes, MD,  by Ashley Jacobs, ED Scribe. The patient was seen in room APA18/APA18 and the patient's care was started at 10:43 AM   Chief Complaint  Patient presents with  . Ankle Pain   (Consider location/radiation/quality/duration/timing/severity/associated sxs/prior Treatment) Patient is a 72 y.o. female presenting with ankle pain. The history is provided by the patient and medical records. No language interpreter was used.  Ankle Pain Location:  Ankle Time since incident:  9 hours Injury: yes   Mechanism of injury: fall   Ankle location:  L ankle Pain details:    Radiates to:  Does not radiate   Onset quality:  Sudden   Timing:  Constant Chronicity:  New Foreign body present:  No foreign bodies Prior injury to area:  No Relieved by:  Nothing Worsened by:  Nothing tried Ineffective treatments:  None tried Associated symptoms: back pain and neck pain   Associated symptoms: no fever    HPI Comments: IRIS HAIRSTON  72 y.o. female who presents to the Emergency Department complaining of left ankle pain that presented 9 hours PTA after falling. She also mentions the associated symptoms of swelling to her left ankle. Pt reports that she reset her ankle manually via subluxation. She describes the pain as waxing and waning at worst current pain is  8/10 and currently presents with numbness. Pt mentions that the pain worsened with standing and nothing seems to relieve the pain.  She explainsthat she is able to walk but with difficulty. Pt denies head injury and LOC.   Past Medical History  Diagnosis Date  . Hypertension   . Diabetes mellitus   . Fibromyalgia   . Hyperlipidemia   . Hypothyroidism   . Depression   . Complication of anesthesia   . PONV (postoperative nausea and vomiting)    Past Surgical History  Procedure Laterality Date  . Carpal tunnel release   1986    bilateral  . Arthroscopy  right 2002, left 2007    bilateral  . Cesarean section  1969, 1967    x2  . Back surgery  10/2007, 09/2008    x2  . Appendectomy  2002  . Knee surgery      both   Family History  Problem Relation Age of Onset  . Anesthesia problems Mother   . Hypotension Neg Hx   . Pseudochol deficiency Neg Hx   . Malignant hyperthermia Neg Hx   . Hypertension Father   . CAD Mother 61  . Cancer Mother     Pancreatic cancer   History  Substance Use Topics  . Smoking status: Former Smoker -- 1.00 packs/day for 43 years    Types: Cigarettes  . Smokeless tobacco: Not on file     Comment: Quit about 20 years ago.   . Alcohol Use: No     Comment: occasionally wine   OB History   Grav Para Term Preterm Abortions TAB SAB Ect Mult Living                 Review of Systems  Constitutional: Negative for fever and chills.  HENT: Positive for congestion and neck pain. Negative for sore throat and rhinorrhea.   Eyes: Negative for visual disturbance.  Respiratory: Positive for cough. Negative for shortness of breath.   Cardiovascular: Negative for chest pain.  Gastrointestinal: Negative for nausea, vomiting,  abdominal pain and diarrhea.  Genitourinary: Negative for dysuria.  Musculoskeletal: Positive for back pain, joint swelling, arthralgias and gait problem.  Skin: Negative for rash.  Neurological: Negative for syncope and headaches.  Hematological: Does not bruise/bleed easily.  Psychiatric/Behavioral: Negative for confusion.  All other systems reviewed and are negative.    Allergies  Codeine and Penicillins  Home Medications   Current Outpatient Rx  Name  Route  Sig  Dispense  Refill  . aspirin EC 81 MG tablet   Oral   Take 81 mg by mouth daily.           Marland Kitchen esomeprazole (NEXIUM) 20 MG capsule   Oral   Take 20 mg by mouth daily before breakfast.           . fluticasone (FLONASE) 50 MCG/ACT nasal spray   Nasal   Place 2 sprays into the nose  daily.           . hydrochlorothiazide (HYDRODIURIL) 25 MG tablet   Oral   Take 25 mg by mouth daily.           Marland Kitchen HYDROcodone-acetaminophen (VICODIN) 5-500 MG per tablet   Oral   Take 1-2 tablets by mouth every 6 (six) hours as needed for pain.   20 tablet   0   . levothyroxine (SYNTHROID, LEVOTHROID) 75 MCG tablet   Oral   Take 75 mcg by mouth daily before breakfast.          . lisinopril (PRINIVIL,ZESTRIL) 10 MG tablet   Oral   Take 10 mg by mouth daily.           . metFORMIN (GLUCOPHAGE) 500 MG tablet   Oral   Take 500 mg by mouth 2 (two) times daily with a meal.           . Multiple Vitamins-Minerals (MULTIVITAMINS THER. W/MINERALS) TABS   Oral   Take 1 tablet by mouth daily.           Marland Kitchen NIFEdipine (PROCARDIA XL/ADALAT-CC) 60 MG 24 hr tablet   Oral   Take 60 mg by mouth daily.           . rosuvastatin (CRESTOR) 40 MG tablet   Oral   Take 40 mg by mouth at bedtime.           . sertraline (ZOLOFT) 50 MG tablet   Oral   Take 50 mg by mouth daily.           Marland Kitchen HYDROcodone-acetaminophen (NORCO/VICODIN) 5-325 MG per tablet   Oral   Take 1-2 tablets by mouth every 6 (six) hours as needed for pain.   20 tablet   0    BP 133/71  Pulse 70  Temp(Src) 98 F (36.7 C) (Oral)  Resp 16  Ht 4\' 11"  (1.499 m)  Wt 200 lb (90.719 kg)  BMI 40.37 kg/m2  SpO2 95% Physical Exam  Nursing note and vitals reviewed. Constitutional: She is oriented to person, place, and time. She appears well-developed and well-nourished.  Non-toxic appearance. She does not appear ill. No distress.  HENT:  Head: Normocephalic and atraumatic.  Right Ear: External ear normal.  Left Ear: External ear normal.  Nose: Nose normal. No mucosal edema or rhinorrhea.  Mouth/Throat: Oropharynx is clear and moist and mucous membranes are normal. No dental abscesses or edematous.  Eyes: Conjunctivae and EOM are normal. Pupils are equal, round, and reactive to light.  Neck: Normal range of  motion and full passive range  of motion without pain. Neck supple.  Cardiovascular: Normal rate, regular rhythm and normal heart sounds.  Exam reveals no gallop and no friction rub.   No murmur heard. Pulmonary/Chest: Effort normal and breath sounds normal. No respiratory distress. She has no wheezes. She has no rhonchi. She has no rales. She exhibits no tenderness and no crepitus.  Abdominal: Soft. Normal appearance and bowel sounds are normal. She exhibits no distension. There is no tenderness. There is no rebound and no guarding.  Musculoskeletal: Normal range of motion. She exhibits no edema and no tenderness.  No tenderness in proximal fibula No edema in medial malleolus No medial tenderness DP pulse is 2+ in left foot Swelling and tenderness to the lateral malleolus Left great toe cap refill 1 sec   Neurological: She is alert and oriented to person, place, and time. She has normal strength. No cranial nerve deficit.  Skin: Skin is warm, dry and intact. No rash noted. No erythema. No pallor.  Psychiatric: She has a normal mood and affect. Her speech is normal and behavior is normal. Her mood appears not anxious.    ED Course  DIAGNOSTIC STUDIES: Oxygen Saturation is 95% on [place], low by my interpretation.    COORDINATION OF CARE: 10:46 AM Discussed course of care with pt . Pt understands and agrees.   Procedures (including critical care time)  Labs Reviewed - No data to display Dg Ankle Complete Left  04/16/2013   *RADIOLOGY REPORT*  Clinical Data: Twisted ankle with swelling and pain  LEFT ANKLE COMPLETE - 3+ VIEW  Comparison: None.  Findings: No fracture or dislocation.  Mild soft tissue swelling. Moderate heel spur.  IMPRESSION: Sprain.  No fracture.   Original Report Authenticated By: Esperanza Heir, M.D.   1. Left ankle sprain, initial encounter     MDM  X-rays negative for fracture. Findings consistent with a left ankle sprain. History is suggestive of perhaps a  subluxation of the ankle. Will treat with ASO wrap can weight-bear and ambulate as tolerated.    I personally performed the services described in this documentation, which was scribed in my presence. The recorded information has been reviewed and is accurate.    Shelda Jakes, MD 04/16/13 1140

## 2013-04-16 NOTE — ED Notes (Signed)
Dr. Zackowski at bedside  

## 2013-04-16 NOTE — ED Notes (Signed)
Pt c/o left ankle pain after a fall during the night, pt states that she had gotten up around 1am and thinks that she may have gotten tangled with the cat under her feet but is not sure, causing to her fall, ice pack applied to left ankle, cms intact,

## 2013-12-27 DIAGNOSIS — E119 Type 2 diabetes mellitus without complications: Secondary | ICD-10-CM | POA: Insufficient documentation

## 2013-12-27 DIAGNOSIS — E039 Hypothyroidism, unspecified: Secondary | ICD-10-CM | POA: Insufficient documentation

## 2014-11-20 DIAGNOSIS — F411 Generalized anxiety disorder: Secondary | ICD-10-CM | POA: Insufficient documentation

## 2014-11-20 DIAGNOSIS — K219 Gastro-esophageal reflux disease without esophagitis: Secondary | ICD-10-CM | POA: Insufficient documentation

## 2015-02-20 DIAGNOSIS — I10 Essential (primary) hypertension: Secondary | ICD-10-CM | POA: Insufficient documentation

## 2015-02-20 DIAGNOSIS — E1159 Type 2 diabetes mellitus with other circulatory complications: Secondary | ICD-10-CM | POA: Insufficient documentation

## 2015-02-20 DIAGNOSIS — G8929 Other chronic pain: Secondary | ICD-10-CM | POA: Insufficient documentation

## 2015-06-11 DIAGNOSIS — H3530 Unspecified macular degeneration: Secondary | ICD-10-CM | POA: Insufficient documentation

## 2015-10-24 ENCOUNTER — Ambulatory Visit (INDEPENDENT_AMBULATORY_CARE_PROVIDER_SITE_OTHER): Payer: Medicare Other

## 2015-10-24 ENCOUNTER — Encounter: Payer: Self-pay | Admitting: Orthopaedic Surgery

## 2015-10-24 ENCOUNTER — Ambulatory Visit (INDEPENDENT_AMBULATORY_CARE_PROVIDER_SITE_OTHER): Payer: Medicare Other | Admitting: Orthopaedic Surgery

## 2015-10-24 VITALS — BP 107/61 | HR 85 | Temp 97.7°F | Resp 16 | Ht 59.0 in | Wt 206.0 lb

## 2015-10-24 DIAGNOSIS — M179 Osteoarthritis of knee, unspecified: Secondary | ICD-10-CM | POA: Insufficient documentation

## 2015-10-24 DIAGNOSIS — M25562 Pain in left knee: Secondary | ICD-10-CM

## 2015-10-24 NOTE — Progress Notes (Signed)
Patient Cynthia Mays [name], female DOB:1941-07-23, 75 y.o. JWJ:[id number]  Chief Complaint  Patient presents with  . Wound Check    follow up left knee pain, "sore"    HPI  Cynthia Mays is a 75 y.o. female who has pain of the left knee for about three weeks getting worse and worse.  She has medial joint line pain.  She has swelling and popping.  Just over the last few days she has giving way of the knee with no locking.  She has no redness, no trauma.  She has taken Advil and Aleve and Tylenol with no help.  She used ice which helped briefly.  Her pain is deep and aching.  HPI  Body mass index is 41.58 kg/(m^2).  Review of Systems  Patient has Diabetes Mellitus. Patient has hypertension. Patient does not have COPD or shortness of breath. Patient has BMI > 35. Patient does not have current smoking history.  Review of Systems  Past Medical History  Diagnosis Date  . Hypertension   . Diabetes mellitus   . Fibromyalgia   . Hyperlipidemia   . Hypothyroidism   . Depression   . Complication of anesthesia   . PONV (postoperative nausea and vomiting)     Past Surgical History  Procedure Laterality Date  . Carpal tunnel release  1986    bilateral  . Arthroscopy  right 2002, left 2007    bilateral  . Cesarean section  1969, 1967    x2  . Back surgery  10/2007, 09/2008    x2  . Appendectomy  2002  . Knee surgery      both    Family History  Problem Relation Age of Onset  . Anesthesia problems Mother   . Hypotension Neg Hx   . Pseudochol deficiency Neg Hx   . Malignant hyperthermia Neg Hx   . Hypertension Father   . CAD Mother 44  . Cancer Mother     Pancreatic cancer    Social History Social History  Substance Use Topics  . Smoking status: Former Smoker -- 1.00 packs/day for 43 years    Types: Cigarettes  . Smokeless tobacco: None     Comment: Quit about 20 years ago.   . Alcohol Use: No     Comment: occasionally wine    Allergies  Allergen  Reactions  . Codeine     Headache, gi upset  . Penicillins Rash    Current Outpatient Prescriptions  Medication Sig Dispense Refill  . aspirin EC 81 MG tablet Take 81 mg by mouth daily.      Marland Kitchen esomeprazole (NEXIUM) 20 MG capsule Take 20 mg by mouth daily before breakfast.      . fluticasone (FLONASE) 50 MCG/ACT nasal spray Place 2 sprays into the nose daily.      . hydrochlorothiazide (HYDRODIURIL) 25 MG tablet Take 25 mg by mouth daily.      Marland Kitchen HYDROcodone-acetaminophen (VICODIN) 5-500 MG per tablet Take 1-2 tablets by mouth every 6 (six) hours as needed for pain. 20 tablet 0  . levothyroxine (SYNTHROID, LEVOTHROID) 75 MCG tablet Take 75 mcg by mouth daily before breakfast.     . lisinopril (PRINIVIL,ZESTRIL) 10 MG tablet Take 10 mg by mouth daily.      . metFORMIN (GLUCOPHAGE) 500 MG tablet Take 500 mg by mouth 2 (two) times daily with a meal.      . Multiple Vitamins-Minerals (MULTIVITAMINS THER. W/MINERALS) TABS Take 1 tablet by mouth daily.      Marland Kitchen  NIFEdipine (PROCARDIA XL/ADALAT-CC) 60 MG 24 hr tablet Take 60 mg by mouth daily.      . pravastatin (PRAVACHOL) 40 MG tablet Take 40 mg by mouth daily.    . sertraline (ZOLOFT) 50 MG tablet Take 50 mg by mouth daily.       No current facility-administered medications for this visit.     Physical Exam  Blood pressure 107/61, pulse 85, temperature 97.7 F (36.5 C), resp. rate 16, height  (1.499 m), weight 206 lb (93.441 kg).  Constitutional: overall normal hygiene, normal nutrition, well developed, normal grooming, normal body habitus. Assistive device:none  Musculoskeletal: gait and station Limp left, muscle tone and strength are normal, no tremors or atrophy is present.  .  Neurological: coordination overall normal.  Deep tendon reflex/nerve stretch intact.  Sensation normal.  Cranial nerves II-XII intact.   Skin:   normal overall no scars, lesions, ulcers or rashes. No psoriasis.  Psychiatric: Alert and oriented x 3.   Recent memory intact, remote memory unclear.  Normal mood and affect. Well groomed.  Good eye contact.  Cardiovascular: overall no swelling, no varicosities, no edema bilaterally, normal temperatures of the legs and arms, no clubbing, cyanosis and good capillary refill.  Lymphatic: palpation is normal.  The left lower extremity is examined:  Inspection:  Thigh:  Non-tender and no defects  Knee has swelling 2+ effusion.                        Joint tenderness is present                        Patient is tender over the medial joint line  Lower Leg:  Has normal appearance and no tenderness or defects  Ankle:  Non-tender and no defects  Foot:  Non-tender and no defects Range of Motion:  Knee:  Range of motion is: 0-100                        Crepitus is  present  Ankle:  Range of motion is normal. Strength and Tone:  The left lower extremity has normal strength and tone. Stability:  Knee:  The knee has positive medial McMurray.  Ankle:  The ankle is stable.  The patient request injection, verbal consent was obtained.  The left knee was prepped appropriately after time out was performed.   Sterile technique was observed and injection of 1 cc of Depo-Medrol 40 mg with several cc's of plain xylocaine. Anesthesia was provided by ethyl chloride and a 20-gauge needle was used to inject the knee area. The injection was tolerated well.  A band aid dressing was applied.  The patient was advised to apply ice later today and tomorrow to the injection sight as needed.  Extremities:right knee is normal Inspection right knee is normal Strength and tone normal of the right knee Range of motion 0 to 115 right knee with crepitus  Additional services performed: x-ray of the left knee.  Arrange for MRI of the left knee. Injection of the left knee.  I talked to her about her diabetes.  It is well controlled.  She does not remember her A1C.  Her hypertension is controlled.  The patient has  been educated about the nature of the problem(s) and counseled on treatment options.  The patient appeared to understand what I have discussed and is in agreement with it.  PLAN Call if any  problems.  Precautions discussed.  Continue current medications.   Return to clinic after MRI of the left knee.

## 2015-10-24 NOTE — Patient Instructions (Signed)
MRI ORDERED. We will contact your insurance company for pre-certification. After we receive that, we will schedule you an appointment for the MRI and contact you. If you have not heard from our office in one week, contact us.     

## 2015-10-25 ENCOUNTER — Telehealth: Payer: Self-pay | Admitting: *Deleted

## 2015-10-25 NOTE — Telephone Encounter (Signed)
NO PRECERT REQUIRED PER JASMINE W WITH [employer] FOR MRI LEFT KNEE REF # [id number] NO PRECERT REQUIRED PER ONLINE MEDICAID

## 2015-10-25 NOTE — Telephone Encounter (Signed)
MRI APPOINTMENT SCHEDULED FOR 11/07/2015 AT 4:00PM ARRIVE BY 3:45 PM [facility]. FOLLOW UP WITH DR. Hilda Lias 11/20/15 AT 3:10 PM. PATIENT AWARE

## 2015-11-07 ENCOUNTER — Ambulatory Visit (HOSPITAL_COMMUNITY)
Admission: RE | Admit: 2015-11-07 | Discharge: 2015-11-07 | Disposition: A | Discharge status: Home / Self Care | Payer: Medicare Other | Source: Ambulatory Visit | Attending: Orthopaedic Surgery | Admitting: Orthopaedic Surgery

## 2015-11-07 DIAGNOSIS — M25562 Pain in left knee: Secondary | ICD-10-CM

## 2015-11-07 DIAGNOSIS — M1712 Unilateral primary osteoarthritis, left knee: Secondary | ICD-10-CM | POA: Diagnosis not present

## 2015-11-07 DIAGNOSIS — M23322 Other meniscus derangements, posterior horn of medial meniscus, left knee: Secondary | ICD-10-CM | POA: Insufficient documentation

## 2015-11-20 ENCOUNTER — Ambulatory Visit (INDEPENDENT_AMBULATORY_CARE_PROVIDER_SITE_OTHER): Payer: Medicare Other | Admitting: Orthopaedic Surgery

## 2015-11-20 VITALS — BP 96/64 | HR 77 | Temp 97.5°F | Ht 59.0 in | Wt 200.0 lb

## 2015-11-20 DIAGNOSIS — M25562 Pain in left knee: Secondary | ICD-10-CM | POA: Diagnosis not present

## 2015-11-20 NOTE — Progress Notes (Signed)
Patient ZO:XWRUEAVWU:Cynthia Cristy FriedlanderJ Mudrick, female DOB:08/02/1941, 75 y.o. JWJ:[id number]  Chief Complaint  Patient presents with  . Results    MRI Results Left knee    HPI  Cynthia Mays is a 75 y.o. female who has had left knee pain.  She had a MRI of the knee which I reviewed with her today.  She is feeling better over the last week.  She has no giving way.  She has swelling but no redness.  She has popping.  She has no new trauma.  HPI The MRI findings: IMPRESSION: 1. Interval change in the appearance of the medial meniscus suggesting partial meniscectomy. The posterior horn and body are diminutive and diffusely degenerated. No centrally displaced meniscal fragment identified. 2. Mildly progressive tricompartmental degenerative changes, most advanced in the patellofemoral compartment. This distribution suggests CPPD arthropathy. 3. The lateral meniscus, cruciate and collateral ligaments appear intact. 4. No acute osseous findings.  Body mass index is 40.37 kg/(m^2).  Review of Systems  Constitutional:       Patient has Diabetes Mellitus. Patient has hypertension. Patient has COPD or shortness of breath. Patient has BMI > 35. Patient does not have current smoking history.  Respiratory: Positive for shortness of breath.   Cardiovascular: Negative for leg swelling.  Endocrine: Positive for cold intolerance.  Musculoskeletal: Positive for joint swelling, arthralgias and gait problem.  Allergic/Immunologic: Positive for environmental allergies.    Past Medical History  Diagnosis Date  . Hypertension   . Diabetes mellitus   . Fibromyalgia   . Hyperlipidemia   . Hypothyroidism   . Depression   . Complication of anesthesia   . PONV (postoperative nausea and vomiting)     Past Surgical History  Procedure Laterality Date  . Carpal tunnel release  1986    bilateral  . Arthroscopy  right 2002, left 2007    bilateral  . Cesarean section  1969, 1967    x2  . Back surgery   10/2007, 09/2008    x2  . Appendectomy  2002  . Knee surgery      both    Family History  Problem Relation Age of Onset  . Anesthesia problems Mother   . Hypotension Neg Hx   . Pseudochol deficiency Neg Hx   . Malignant hyperthermia Neg Hx   . Hypertension Father   . CAD Mother 2365  . Cancer Mother     Pancreatic cancer    Social History Social History  Substance Use Topics  . Smoking status: Former Smoker -- 1.00 packs/day for 43 years    Types: Cigarettes  . Smokeless tobacco: Not on file     Comment: Quit about 20 years ago.   . Alcohol Use: No     Comment: occasionally wine    Allergies  Allergen Reactions  . Codeine     Headache, gi upset  . Penicillins Rash    Current Outpatient Prescriptions  Medication Sig Dispense Refill  . aspirin EC 81 MG tablet Take 81 mg by mouth daily.      Marland Kitchen. esomeprazole (NEXIUM) 20 MG capsule Take 20 mg by mouth daily before breakfast.      . fluticasone (FLONASE) 50 MCG/ACT nasal spray Place 2 sprays into the nose daily.      . hydrochlorothiazide (HYDRODIURIL) 25 MG tablet Take 25 mg by mouth daily.      Marland Kitchen. HYDROcodone-acetaminophen (VICODIN) 5-500 MG per tablet Take 1-2 tablets by mouth every 6 (six) hours as needed for pain. 20 tablet  0  . levothyroxine (SYNTHROID, LEVOTHROID) 75 MCG tablet Take 75 mcg by mouth daily before breakfast.     . lisinopril (PRINIVIL,ZESTRIL) 10 MG tablet Take 10 mg by mouth daily.      . metFORMIN (GLUCOPHAGE) 500 MG tablet Take 500 mg by mouth 2 (two) times daily with a meal.      . Multiple Vitamins-Minerals (MULTIVITAMINS THER. W/MINERALS) TABS Take 1 tablet by mouth daily.      Marland Kitchen NIFEdipine (PROCARDIA XL/ADALAT-CC) 60 MG 24 hr tablet Take 60 mg by mouth daily.      . pravastatin (PRAVACHOL) 40 MG tablet Take 40 mg by mouth daily.    . sertraline (ZOLOFT) 50 MG tablet Take 50 mg by mouth daily.       No current facility-administered medications for this visit.     Physical Exam  Blood pressure  96/64, pulse 77, temperature 97.5 F (36.4 C), height  (1.499 m), weight 200 lb (90.719 kg).  Constitutional: overall normal hygiene, normal nutrition, well developed, normal grooming, normal body habitus. Assistive device:none  Musculoskeletal: gait and station Limp left slighlty, muscle tone and strength are normal, no tremors or atrophy is present.  .  Neurological: coordination overall normal.  Deep tendon reflex/nerve stretch intact.  Sensation normal.  Cranial nerves II-XII intact.   Skin:   Normal except for some left knee arthroscopy scars, otherwise overall no scars, lesions, ulcers or rashes. No psoriasis.  Psychiatric: Alert and oriented x 3.  Recent memory intact, remote memory unclear.  Normal mood and affect. Well groomed.  Good eye contact.  Cardiovascular: overall no swelling, no varicosities, no edema bilaterally, normal temperatures of the legs and arms, no clubbing, cyanosis and good capillary refill.  Lymphatic: palpation is normal.   The left lower extremity is examined:  Inspection:  Thigh:  Non-tender and no defects  Knee has swelling 1+ effusion.                        Joint tenderness is present                        Patient is tender over the medial joint line  Lower Leg:  Has normal appearance and no tenderness or defects  Ankle:  Non-tender and no defects  Foot:  Non-tender and no defects Range of Motion:  Knee:  Range of motion is: 0 to 1095                        Crepitus is  present  Ankle:  Range of motion is normal. Strength and Tone:  The left lower extremity has normal strength and tone. Stability:  Knee:  The knee is stable.  Ankle:  The ankle is stable.   The patient has been educated about the nature of the problem(s) and counseled on treatment options.  The patient appeared to understand what I have discussed and is in agreement with it.  PLAN Call if any problems.  Precautions discussed.  Continue current medications.   Return  to clinic one month

## 2015-12-18 ENCOUNTER — Ambulatory Visit (INDEPENDENT_AMBULATORY_CARE_PROVIDER_SITE_OTHER): Payer: Medicare Other | Admitting: Orthopaedic Surgery

## 2015-12-18 ENCOUNTER — Encounter: Payer: Self-pay | Admitting: Orthopaedic Surgery

## 2015-12-18 VITALS — BP 106/63 | HR 68 | Temp 97.5°F | Ht 59.0 in | Wt 200.0 lb

## 2015-12-18 DIAGNOSIS — M25562 Pain in left knee: Secondary | ICD-10-CM | POA: Diagnosis not present

## 2015-12-18 NOTE — Progress Notes (Signed)
Patient ID: Cynthia Mays, female   DOB: 11/24/1940, 75 y.o.   MRN: [id number]  CC:  My left knee is hurting  She has chronic pain of the left knee.  She had MRI which showed DJD and no tear.   She has no redness.  NV is intact. ROM is 0-105 with crepitus.  Encounter Diagnosis  Name Primary?  . Left knee pain Yes   PROCEDURE NOTE:  The patient requests injections of the left knee , verbal consent was obtained.  The left knee was prepped appropriately after time out was performed.   Sterile technique was observed and injection of 1 cc of Depo-Medrol 40 mg with several cc's of plain xylocaine. Anesthesia was provided by ethyl chloride and a 20-gauge needle was used to inject the knee area. The injection was tolerated well.  A band aid dressing was applied.  The patient was advised to apply ice later today and tomorrow to the injection sight as needed.  RTC six weeks.  Call if any problems.

## 2015-12-20 ENCOUNTER — Ambulatory Visit: Payer: Medicare Other | Admitting: Orthopaedic Surgery

## 2015-12-31 DIAGNOSIS — E1169 Type 2 diabetes mellitus with other specified complication: Secondary | ICD-10-CM | POA: Insufficient documentation

## 2015-12-31 DIAGNOSIS — I2510 Atherosclerotic heart disease of native coronary artery without angina pectoris: Secondary | ICD-10-CM | POA: Insufficient documentation

## 2015-12-31 DIAGNOSIS — I2583 Coronary atherosclerosis due to lipid rich plaque: Secondary | ICD-10-CM | POA: Insufficient documentation

## 2015-12-31 DIAGNOSIS — E785 Hyperlipidemia, unspecified: Secondary | ICD-10-CM | POA: Insufficient documentation

## 2016-01-18 ENCOUNTER — Telehealth: Payer: Self-pay | Admitting: Orthopaedic Surgery

## 2016-01-18 ENCOUNTER — Emergency Department (HOSPITAL_COMMUNITY)
Admission: EM | Admit: 2016-01-18 | Discharge: 2016-01-18 | Disposition: A | Discharge status: Home / Self Care | Payer: Medicare Other | Attending: Emergency Medicine | Admitting: Emergency Medicine

## 2016-01-18 ENCOUNTER — Emergency Department (HOSPITAL_COMMUNITY): Payer: Medicare Other

## 2016-01-18 ENCOUNTER — Encounter (HOSPITAL_COMMUNITY): Payer: Self-pay | Admitting: *Deleted

## 2016-01-18 DIAGNOSIS — Z87891 Personal history of nicotine dependence: Secondary | ICD-10-CM | POA: Diagnosis not present

## 2016-01-18 DIAGNOSIS — Y939 Activity, unspecified: Secondary | ICD-10-CM | POA: Insufficient documentation

## 2016-01-18 DIAGNOSIS — Z7984 Long term (current) use of oral hypoglycemic drugs: Secondary | ICD-10-CM | POA: Diagnosis not present

## 2016-01-18 DIAGNOSIS — E785 Hyperlipidemia, unspecified: Secondary | ICD-10-CM | POA: Insufficient documentation

## 2016-01-18 DIAGNOSIS — I10 Essential (primary) hypertension: Secondary | ICD-10-CM | POA: Insufficient documentation

## 2016-01-18 DIAGNOSIS — E119 Type 2 diabetes mellitus without complications: Secondary | ICD-10-CM | POA: Diagnosis not present

## 2016-01-18 DIAGNOSIS — S8991XA Unspecified injury of right lower leg, initial encounter: Secondary | ICD-10-CM | POA: Diagnosis present

## 2016-01-18 DIAGNOSIS — Z7982 Long term (current) use of aspirin: Secondary | ICD-10-CM | POA: Diagnosis not present

## 2016-01-18 DIAGNOSIS — M1711 Unilateral primary osteoarthritis, right knee: Secondary | ICD-10-CM

## 2016-01-18 DIAGNOSIS — Y929 Unspecified place or not applicable: Secondary | ICD-10-CM | POA: Insufficient documentation

## 2016-01-18 DIAGNOSIS — F329 Major depressive disorder, single episode, unspecified: Secondary | ICD-10-CM | POA: Insufficient documentation

## 2016-01-18 DIAGNOSIS — S8391XA Sprain of unspecified site of right knee, initial encounter: Secondary | ICD-10-CM

## 2016-01-18 DIAGNOSIS — Z79899 Other long term (current) drug therapy: Secondary | ICD-10-CM | POA: Insufficient documentation

## 2016-01-18 DIAGNOSIS — W102XXA Fall (on)(from) incline, initial encounter: Secondary | ICD-10-CM | POA: Diagnosis not present

## 2016-01-18 DIAGNOSIS — M179 Osteoarthritis of knee, unspecified: Secondary | ICD-10-CM | POA: Diagnosis not present

## 2016-01-18 DIAGNOSIS — Y999 Unspecified external cause status: Secondary | ICD-10-CM | POA: Insufficient documentation

## 2016-01-18 DIAGNOSIS — E039 Hypothyroidism, unspecified: Secondary | ICD-10-CM | POA: Diagnosis not present

## 2016-01-18 NOTE — ED Provider Notes (Signed)
CSN: [id number]     Arrival date & time 01/18/16  1212 History  By signing my name below, I, [name], attest that this documentation has been prepared under the direction and in the presence of Linwood Dibbles, MD. Electronically Signed: Angelene Giovanni, ED Scribe. 01/18/2016. 12:41 PM.     Chief Complaint  Patient presents with  . Knee Pain   Patient is a 75 y.o. female presenting with knee pain. The history is provided by the patient. No language interpreter was used.  Knee Pain Location:  Knee Injury: yes   Mechanism of injury: fall   Fall:    Fall occurred:  Standing   Impact surface:  Hard floor   Point of impact:  Knees   Entrapped after fall: no   Knee location:  R knee Pain details:    Radiates to:  Does not radiate   Severity:  Moderate   Onset quality:  Gradual   Duration:  3 days   Timing:  Constant   Progression:  Worsening Chronicity:  New Relieved by:  None tried Worsened by:  Nothing tried Ineffective treatments:  None tried Associated symptoms: no fever, no muscle weakness and no swelling    HPI Comments: Cynthia Mays is a 75 y.o. female with a hx of HTN and DM who presents to the Emergency Department complaining of gradually worsening constant right knee pain s/p fall that occurred 3 days ago. She reports associated pain with ambulation. Pt explains that she was standing on a ramp when it collapsed, causing her to fall on her right knee onto the brick floor. No LOC. No alleviating factors noted. Pt has not taken any medications for her knee pain. She denies any abdominal pain, n/v, HA, or any open wounds.  Past Medical History  Diagnosis Date  . Hypertension   . Diabetes mellitus   . Fibromyalgia   . Hyperlipidemia   . Hypothyroidism   . Depression   . Complication of anesthesia   . PONV (postoperative nausea and vomiting)    Past Surgical History  Procedure Laterality Date  . Carpal tunnel release  1986    bilateral  . Arthroscopy  right  2002, left 2007    bilateral  . Cesarean section  1969, 1967    x2  . Back surgery  10/2007, 09/2008    x2  . Appendectomy  2002  . Knee surgery      both   Family History  Problem Relation Age of Onset  . Anesthesia problems Mother   . Hypotension Neg Hx   . Pseudochol deficiency Neg Hx   . Malignant hyperthermia Neg Hx   . Hypertension Father   . CAD Mother 1  . Cancer Mother     Pancreatic cancer   Social History  Substance Use Topics  . Smoking status: Former Smoker -- 1.00 packs/day for 43 years    Types: Cigarettes  . Smokeless tobacco: None     Comment: Quit about 20 years ago.   . Alcohol Use: No     Comment: occasionally wine   OB History    No data available     Review of Systems  Constitutional: Negative for fever.  Eyes: Negative for visual disturbance.  Gastrointestinal: Negative for nausea, vomiting and abdominal pain.  Musculoskeletal: Positive for arthralgias and gait problem.  Skin: Negative for wound.  Neurological: Negative for headaches.  All other systems reviewed and are negative.     Allergies  Codeine and Penicillins  Home Medications   Prior to Admission medications   Medication Sig Start Date End Date Taking? Authorizing Provider  aspirin EC 81 MG tablet Take 81 mg by mouth daily.     Yes Historical Provider, MD  esomeprazole (NEXIUM) 20 MG capsule Take 20 mg by mouth daily before breakfast.     Yes Historical Provider, MD  hydrochlorothiazide (HYDRODIURIL) 25 MG tablet Take 25 mg by mouth daily.     Yes Historical Provider, MD  HYDROcodone-acetaminophen (NORCO/VICODIN) 5-325 MG tablet Take 1 tablet by mouth every 6 (six) hours as needed for moderate pain.   Yes Historical Provider, MD  levothyroxine (SYNTHROID, LEVOTHROID) 100 MCG tablet Take 100 mcg by mouth daily before breakfast.   Yes Historical Provider, MD  lisinopril (PRINIVIL,ZESTRIL) 10 MG tablet Take 10 mg by mouth daily.     Yes Historical Provider, MD  metFORMIN  (GLUCOPHAGE) 500 MG tablet Take 500 mg by mouth daily with breakfast.    Yes Historical Provider, MD  Multiple Vitamins-Minerals (MULTIVITAMINS THER. W/MINERALS) TABS Take 1 tablet by mouth daily.     Yes Historical Provider, MD  NIFEdipine (PROCARDIA XL/ADALAT-CC) 60 MG 24 hr tablet Take 60 mg by mouth daily.     Yes Historical Provider, MD  pravastatin (PRAVACHOL) 40 MG tablet Take 40 mg by mouth daily.   Yes Historical Provider, MD  sertraline (ZOLOFT) 50 MG tablet Take 50 mg by mouth daily.     Yes Historical Provider, MD  fluticasone (FLONASE) 50 MCG/ACT nasal spray Place 2 sprays into the nose daily as needed for allergies.     Historical Provider, MD  HYDROcodone-acetaminophen (VICODIN) 5-500 MG per tablet Take 1-2 tablets by mouth every 6 (six) hours as needed for pain. Patient not taking: Reported on 01/18/2016 06/04/12   Geoffery Lyons, MD   BP 100/69 mmHg  Pulse 62  Temp(Src) 98.1 F (36.7 C) (Oral)  Resp 16  Ht  (1.499 m)  Wt 90.266 kg  BMI 40.17 kg/m2  SpO2 96% Physical Exam  Constitutional: She appears well-developed and well-nourished. No distress.  HENT:  Head: Normocephalic and atraumatic.  Right Ear: External ear normal.  Left Ear: External ear normal.  Eyes: Conjunctivae are normal. Right eye exhibits no discharge. Left eye exhibits no discharge. No scleral icterus.  Neck: Neck supple. No tracheal deviation present.  Cardiovascular: Normal rate, regular rhythm and intact distal pulses.   Pulmonary/Chest: Effort normal and breath sounds normal. No stridor. No respiratory distress. She has no wheezes. She has no rales.  Abdominal: Soft. Bowel sounds are normal. She exhibits no distension. There is no tenderness. There is no rebound and no guarding.  Musculoskeletal: She exhibits no edema.       Right knee: She exhibits no swelling, no effusion, no deformity and no laceration. Tenderness found.  BUE and BLE palpated, no areas of swelling or deformities.  Spine  palpated including cervical spine, no tenderness or deformities.   Neurological: She is alert. She has normal strength. No cranial nerve deficit (no facial droop, extraocular movements intact, no slurred speech) or sensory deficit. She exhibits normal muscle tone. She displays no seizure activity. Coordination normal.  Skin: Skin is warm and dry. No rash noted.  Psychiatric: She has a normal mood and affect.  Nursing note and vitals reviewed.   ED Course  Procedures (including critical care time) DIAGNOSTIC STUDIES: Oxygen Saturation is 97% on RA, normal by my interpretation.    COORDINATION OF CARE: 12:40 PM- Pt advised of plan  for treatment and pt agrees. Pt will receive knee x-ray for further evaluation.    Labs Review Labs Reviewed - No data to display  Imaging Review Dg Knee Complete 4 Views Right  01/18/2016  CLINICAL DATA:  Pain following fall 3 days prior EXAM: RIGHT KNEE - COMPLETE 4+ VIEW COMPARISON:  Right knee MRI  February 07, 2011 FINDINGS: Frontal, lateral, and bilateral oblique views were obtained. There is no demonstrable fracture or dislocation. No joint effusion. There is moderate narrowing medially and in the patellofemoral joint region. There is spurring in all compartments, most notably along the posterior patella. There are scattered foci of meniscal calcification. There is arterial vascular calcification posterior to the distal femur. IMPRESSION: Osteoarthritic change, most notably in the patellofemoral joint and medial compartment. No fracture or joint effusion. Chondrocalcinosis is noted. Chondrocalcinosis may be seen with osteoarthritis but also may be indicative of superimposed calcium pyrophosphate deposition disease. Electronically Signed   By: Bretta BangWilliam  Woodruff III M.D.   On: 01/18/2016 13:18     Linwood DibblesJon Reizy Dunlow, MD has personally reviewed and evaluated these images and lab results as part of his medical decision-making.    MDM   Final diagnoses:  Osteoarthritis of  right knee, unspecified osteoarthritis type  Knee sprain, right, initial encounter    No fx or dislocations.  OA noted on xray.  Pt has pain medications available at home.  Stable for discharge, follow up with ortho prn   I personally performed the services described in this documentation, which was scribed in my presence.  The recorded information has been reviewed and is accurate.   Linwood DibblesJon Thos Matsumoto, MD 01/18/16 2202

## 2016-01-18 NOTE — ED Notes (Signed)
Pt comes in with right knee pain. Pt was standing on a wooden ramp which collapsed under her. She fell onto her right knee then onto her right side. Pts primary pain in her right knee but also has pain in her right shoudler, right hip, and right rib area. Denies hitting her head or any loss of consciousness.

## 2016-01-18 NOTE — Telephone Encounter (Signed)
Patient called and relayed she has had a fall and injured "other knee" - right knee.  She is currently in treatment for left.  Due to no providers in office today, she will go to the Emergency room for work-up and Xray, and call back to schedule appointment.

## 2016-01-18 NOTE — Discharge Instructions (Signed)
  Arthritis Arthritis means joint pain. It can also mean joint disease. A joint is a place where bones come together. People who have arthritis may have:  Red joints.  Swollen joints.  Stiff joints.  Warm joints.  A fever.  A feeling of being sick. HOME CARE Pay attention to any changes in your symptoms. Take these actions to help with your pain and swelling. Medicines  Take over-the-counter and prescription medicines only as told by your doctor.  Do not take aspirin for pain if your doctor says that you may have gout. Activities  Rest your joint if your doctor tells you to.  Avoid activities that make the pain worse.  Exercise your joint regularly as told by your doctor. Try doing exercises like:  Swimming.  Water aerobics.  Biking.  Walking. Joint Care  If your joint is swollen, keep it raised (elevated) if told by your doctor.  If your joint feels stiff in the morning, try taking a warm shower.  If you have diabetes, do not apply heat without asking your doctor.  If told, apply heat to the joint:  Put a towel between the joint and the hot pack or heating pad.  Leave the heat on the area for 20-30 minutes.  If told, apply ice to the joint:  Put ice in a plastic bag.  Place a towel between your skin and the bag.  Leave the ice on for 20 minutes, 2-3 times per day.  Keep all follow-up visits as told by your doctor. GET HELP IF:  The pain gets worse.  You have a fever. GET HELP RIGHT AWAY IF:  You have very bad pain in your joint.  You have swelling in your joint.  Your joint is red.  Many joints become painful and swollen.  You have very bad back pain.  Your leg is very weak.  You cannot control your pee (urine) or poop (stool).   This information is not intended to replace advice given to you by your health care provider. Make sure you discuss any questions you have with your health care provider.   Document Released: 11/12/2009  Document Revised: 05/09/2015 Document Reviewed: 11/13/2014 Elsevier Interactive Patient Education 2016 [employer]  

## 2016-01-18 NOTE — ED Notes (Signed)
Pt is ambulatory with no walking difficulties noted.

## 2016-01-29 ENCOUNTER — Encounter: Payer: Self-pay | Admitting: Orthopaedic Surgery

## 2016-01-29 ENCOUNTER — Ambulatory Visit (INDEPENDENT_AMBULATORY_CARE_PROVIDER_SITE_OTHER): Payer: Medicare Other | Admitting: Orthopaedic Surgery

## 2016-01-29 VITALS — BP 106/69 | HR 63 | Temp 97.7°F | Ht 59.0 in | Wt 201.0 lb

## 2016-01-29 DIAGNOSIS — M25562 Pain in left knee: Secondary | ICD-10-CM | POA: Diagnosis not present

## 2016-01-29 NOTE — Progress Notes (Signed)
Patient JW:[id number] Cynthia Mays, female DOB:15-May-1941, 75 y.o. FAO:[id number]  Chief Complaint  Patient presents with  . Follow-up    left knee    HPI  Cynthia Mays is a 75 y.o. female who is being seen for follow-up of the left knee.  I gave her an injection the last time.  She had a very good response from it. She has little pain and little swelling now.  She has normal gait, no redness, no giving way.  HPI  Body mass index is 40.58 kg/(m^2).  ROS  Review of Systems  Constitutional:       Patient has Diabetes Mellitus. Patient has hypertension. Patient has COPD or shortness of breath. Patient has BMI > 35. Patient does not have current smoking history.  Respiratory: Positive for shortness of breath.   Cardiovascular: Negative for leg swelling.  Endocrine: Positive for cold intolerance.  Musculoskeletal: Positive for joint swelling, arthralgias and gait problem.  Allergic/Immunologic: Positive for environmental allergies.    Past Medical History  Diagnosis Date  . Hypertension   . Diabetes mellitus   . Fibromyalgia   . Hyperlipidemia   . Hypothyroidism   . Depression   . Complication of anesthesia   . PONV (postoperative nausea and vomiting)     Past Surgical History  Procedure Laterality Date  . Carpal tunnel release  1986    bilateral  . Arthroscopy  right 2002, left 2007    bilateral  . Cesarean section  1969, 1967    x2  . Back surgery  10/2007, 09/2008    x2  . Appendectomy  2002  . Knee surgery      both    Family History  Problem Relation Age of Onset  . Anesthesia problems Mother   . Hypotension Neg Hx   . Pseudochol deficiency Neg Hx   . Malignant hyperthermia Neg Hx   . Hypertension Father   . CAD Mother 74  . Cancer Mother     Pancreatic cancer    Social History Social History  Substance Use Topics  . Smoking status: Former Smoker -- 1.00 packs/day for 43 years    Types: Cigarettes  . Smokeless tobacco: None     Comment: Quit  about 20 years ago.   . Alcohol Use: No     Comment: occasionally wine    Allergies  Allergen Reactions  . Codeine Other (See Comments)    Headache, gi upset  . Penicillins Rash    Has patient had a PCN reaction causing immediate rash, facial/tongue/throat swelling, SOB or lightheadedness with hypotension: Yes Has patient had a PCN reaction causing severe rash involving mucus membranes or skin necrosis: No Has patient had a PCN reaction that required hospitalization No Has patient had a PCN reaction occurring within the last 10 years: No If all of the above answers are "NO", then may proceed with Cephalosporin use.     Current Outpatient Prescriptions  Medication Sig Dispense Refill  . aspirin EC 81 MG tablet Take 81 mg by mouth daily.      Marland Kitchen esomeprazole (NEXIUM) 20 MG capsule Take 20 mg by mouth daily before breakfast.      . fluticasone (FLONASE) 50 MCG/ACT nasal spray Place 2 sprays into the nose daily as needed for allergies.     . hydrochlorothiazide (HYDRODIURIL) 25 MG tablet Take 25 mg by mouth daily.      Marland Kitchen HYDROcodone-acetaminophen (NORCO/VICODIN) 5-325 MG tablet Take 1 tablet by mouth every 6 (six) hours as  needed for moderate pain.    Marland Kitchen. HYDROcodone-acetaminophen (VICODIN) 5-500 MG per tablet Take 1-2 tablets by mouth every 6 (six) hours as needed for pain. 20 tablet 0  . levothyroxine (SYNTHROID, LEVOTHROID) 100 MCG tablet Take 100 mcg by mouth daily before breakfast.    . lisinopril (PRINIVIL,ZESTRIL) 10 MG tablet Take 10 mg by mouth daily.      . metFORMIN (GLUCOPHAGE) 500 MG tablet Take 500 mg by mouth daily with breakfast.     . Multiple Vitamins-Minerals (MULTIVITAMINS THER. W/MINERALS) TABS Take 1 tablet by mouth daily.      Marland Kitchen. NIFEdipine (PROCARDIA XL/ADALAT-CC) 60 MG 24 hr tablet Take 60 mg by mouth daily.      . pravastatin (PRAVACHOL) 40 MG tablet Take 40 mg by mouth daily.    . sertraline (ZOLOFT) 50 MG tablet Take 50 mg by mouth daily.       No current  facility-administered medications for this visit.     Physical Exam  Blood pressure 106/69, pulse 63, temperature 97.7 F (36.5 C), height 4\' 11"  (1.499 m), weight 201 lb (91.173 kg).  Constitutional: overall normal hygiene, normal nutrition, well developed, normal grooming, normal body habitus. Assistive device:none  Musculoskeletal: gait and station Limp none, muscle tone and strength are normal, no tremors or atrophy is present.  .  Neurological: coordination overall normal.  Deep tendon reflex/nerve stretch intact.  Sensation normal.  Cranial nerves II-XII intact.   Skin:   normal overall no scars, lesions, ulcers or rashes. No psoriasis.  Psychiatric: Alert and oriented x 3.  Recent memory intact, remote memory unclear.  Normal mood and affect. Well groomed.  Good eye contact.  Cardiovascular: overall no swelling, no varicosities, no edema bilaterally, normal temperatures of the legs and arms, no clubbing, cyanosis and good capillary refill.  Lymphatic: palpation is normal.  The left lower extremity is examined:  Inspection:  Thigh:  Non-tender and no defects  Knee does not have swelling 0 effusion.                        Joint tenderness is not present                        Patient is tender over the medial joint line  Lower Leg:  Has normal appearance and no tenderness or defects  Ankle:  Non-tender and no defects  Foot:  Non-tender and no defects Range of Motion:  Knee:  Range of motion is: 0-115                        Crepitus is  present  Ankle:  Range of motion is normal. Strength and Tone:  The left lower extremity has normal strength and tone. Stability:  Knee:  The knee is stable.  Ankle:  The ankle is stable.    The patient has been educated about the nature of the problem(s) and counseled on treatment options.  The patient appeared to understand what I have discussed and is in agreement with it.  Encounter Diagnosis  Name Primary?  . Left knee pain  Yes    PLAN Call if any problems.  Precautions discussed.  Continue current medications.   Return to clinic as needed.

## 2016-04-02 ENCOUNTER — Ambulatory Visit (INDEPENDENT_AMBULATORY_CARE_PROVIDER_SITE_OTHER): Payer: Medicare Other | Admitting: Orthopaedic Surgery

## 2016-04-02 ENCOUNTER — Encounter: Payer: Self-pay | Admitting: Orthopaedic Surgery

## 2016-04-02 VITALS — BP 101/63 | Temp 97.9°F | Ht 59.75 in | Wt 202.2 lb

## 2016-04-02 DIAGNOSIS — M25561 Pain in right knee: Secondary | ICD-10-CM | POA: Diagnosis not present

## 2016-04-02 NOTE — Progress Notes (Signed)
Patient WU:[id number] Cynthia Mays, female DOB:07-11-1941, 75 y.o. GNF:[id number]  Chief Complaint  Patient presents with  . Follow-up    right knee pain    HPI  Cynthia Mays is a 75 y.o. female who fell and hurt her right knee on Jan 18, 2016.  She had pain laterally.  She was seen in the ER.  No fracture was noted but she has DJD changes.  Her knee continues to hurt. She has lateral swelling and pain and popping.  She has tendency to feel it will give way but it does not fully give way.  She has no locking. She has tired ice, heat, rest, rubs and ibuprofen with no help.  I will get MRI of the right knee.  I am concerned about a meniscus tear.  She has not improved since injury and she should have.  Conservative treatment has not helped.  HPI  Body mass index is 39.82 kg/m.  ROS  Review of Systems  Constitutional:       Patient has Diabetes Mellitus. Patient has hypertension. Patient has COPD or shortness of breath. Patient has BMI > 35. Patient does not have current smoking history.  Respiratory: Positive for shortness of breath.   Cardiovascular: Negative for leg swelling.  Endocrine: Positive for cold intolerance.  Musculoskeletal: Positive for arthralgias, gait problem and joint swelling.  Allergic/Immunologic: Positive for environmental allergies.    Past Medical History:  Diagnosis Date  . Complication of anesthesia   . Depression   . Diabetes mellitus   . Fibromyalgia   . Hyperlipidemia   . Hypertension   . Hypothyroidism   . PONV (postoperative nausea and vomiting)     Past Surgical History:  Procedure Laterality Date  . APPENDECTOMY  2002  . arthroscopy  right 2002, left 2007   bilateral  . BACK SURGERY  10/2007, 09/2008   x2  . CARPAL TUNNEL RELEASE  1986   bilateral  . CESAREAN SECTION  1969, 1967   x2  . KNEE SURGERY     both    Family History  Problem Relation Age of Onset  . Anesthesia problems Mother   . Hypotension Neg Hx   . Pseudochol  deficiency Neg Hx   . Malignant hyperthermia Neg Hx   . Hypertension Father   . CAD Mother 74  . Cancer Mother     Pancreatic cancer    Social History Social History  Substance Use Topics  . Smoking status: Former Smoker    Packs/day: 1.00    Years: 43.00    Types: Cigarettes  . Smokeless tobacco: Not on file     Comment: Quit about 20 years ago.   . Alcohol use No     Comment: occasionally wine    Allergies  Allergen Reactions  . Codeine Other (See Comments)    Headache, gi upset  . Penicillins Rash    Has patient had a PCN reaction causing immediate rash, facial/tongue/throat swelling, SOB or lightheadedness with hypotension: Yes Has patient had a PCN reaction causing severe rash involving mucus membranes or skin necrosis: No Has patient had a PCN reaction that required hospitalization No Has patient had a PCN reaction occurring within the last 10 years: No If all of the above answers are "NO", then may proceed with Cephalosporin use.     Current Outpatient Prescriptions  Medication Sig Dispense Refill  . aspirin EC 81 MG tablet Take 81 mg by mouth daily.      Marland Kitchen esomeprazole (NEXIUM)  20 MG capsule Take 20 mg by mouth daily before breakfast.      . fluticasone (FLONASE) 50 MCG/ACT nasal spray Place 2 sprays into the nose daily as needed for allergies.     . hydrochlorothiazide (HYDRODIURIL) 25 MG tablet Take 25 mg by mouth daily.      Marland Kitchen HYDROcodone-acetaminophen (NORCO/VICODIN) 5-325 MG tablet Take 1 tablet by mouth every 6 (six) hours as needed for moderate pain.    Marland Kitchen HYDROcodone-acetaminophen (VICODIN) 5-500 MG per tablet Take 1-2 tablets by mouth every 6 (six) hours as needed for pain. 20 tablet 0  . levothyroxine (SYNTHROID, LEVOTHROID) 100 MCG tablet Take 100 mcg by mouth daily before breakfast.    . lisinopril (PRINIVIL,ZESTRIL) 10 MG tablet Take 10 mg by mouth daily.      . metFORMIN (GLUCOPHAGE) 500 MG tablet Take 500 mg by mouth daily with breakfast.     .  Multiple Vitamins-Minerals (MULTIVITAMINS THER. W/MINERALS) TABS Take 1 tablet by mouth daily.      Marland Kitchen NIFEdipine (PROCARDIA XL/ADALAT-CC) 60 MG 24 hr tablet Take 60 mg by mouth daily.      . pravastatin (PRAVACHOL) 40 MG tablet Take 40 mg by mouth daily.    . sertraline (ZOLOFT) 50 MG tablet Take 50 mg by mouth daily.       No current facility-administered medications for this visit.      Physical Exam  Blood pressure 101/63, temperature 97.9 F (36.6 C), height 4' 11.75" (1.518 m), weight 202 lb 3.2 oz (91.7 kg).  Constitutional: overall normal hygiene, normal nutrition, well developed, normal grooming, normal body habitus. Assistive device:none  Musculoskeletal: gait and station Limp right, muscle tone and strength are normal, no tremors or atrophy is present.  .  Neurological: coordination overall normal.  Deep tendon reflex/nerve stretch intact.  Sensation normal.  Cranial nerves II-XII intact.   Skin:   normal overall no scars, lesions, ulcers or rashes. No psoriasis.  Psychiatric: Alert and oriented x 3.  Recent memory intact, remote memory unclear.  Normal mood and affect. Well groomed.  Good eye contact.  Cardiovascular: overall no swelling, no varicosities, no edema bilaterally, normal temperatures of the legs and arms, no clubbing, cyanosis and good capillary refill.  Lymphatic: palpation is normal.  The right lower extremity is examined:  Inspection:  Thigh:  Non-tender and no defects  Knee has swelling 1+ effusion.                        Joint tenderness is present                        Patient is not tender over the medial joint line but tender over the lateral joint line.  Lower Leg:  Has normal appearance and no tenderness or defects  Ankle:  Non-tender and no defects  Foot:  Non-tender and no defects Range of Motion:  Knee:  Range of motion is: 0-105                        Crepitus is  present  Ankle:  Range of motion is normal. Strength and Tone:  The  right lower extremity has normal strength and tone. Stability:  Knee:  The knee has positive lateral McMurray.  Ankle:  The ankle is stable.    The patient has been educated about the nature of the problem(s) and counseled on treatment options.  The patient  appeared to understand what I have discussed and is in agreement with it.  Encounter Diagnosis  Name Primary?  . Right knee pain Yes    PLAN Call if any problems.  Precautions discussed.  Continue current medications.   Return to clinic get MRI of the right knee.  Electronically Signed Darreld Mclean, MD 8/2/20171:53 PM

## 2016-04-04 ENCOUNTER — Encounter (HOSPITAL_COMMUNITY): Payer: Self-pay | Admitting: Emergency Medicine

## 2016-04-04 ENCOUNTER — Emergency Department (HOSPITAL_COMMUNITY)
Admission: EM | Admit: 2016-04-04 | Discharge: 2016-04-05 | Disposition: A | Discharge status: Home / Self Care | Payer: Medicare Other | Attending: Emergency Medicine | Admitting: Emergency Medicine

## 2016-04-04 ENCOUNTER — Emergency Department (HOSPITAL_COMMUNITY): Payer: Medicare Other

## 2016-04-04 DIAGNOSIS — Z79899 Other long term (current) drug therapy: Secondary | ICD-10-CM | POA: Insufficient documentation

## 2016-04-04 DIAGNOSIS — E039 Hypothyroidism, unspecified: Secondary | ICD-10-CM | POA: Insufficient documentation

## 2016-04-04 DIAGNOSIS — Z87891 Personal history of nicotine dependence: Secondary | ICD-10-CM | POA: Diagnosis not present

## 2016-04-04 DIAGNOSIS — I10 Essential (primary) hypertension: Secondary | ICD-10-CM | POA: Insufficient documentation

## 2016-04-04 DIAGNOSIS — R112 Nausea with vomiting, unspecified: Secondary | ICD-10-CM | POA: Diagnosis not present

## 2016-04-04 DIAGNOSIS — R1013 Epigastric pain: Secondary | ICD-10-CM | POA: Insufficient documentation

## 2016-04-04 DIAGNOSIS — R0602 Shortness of breath: Secondary | ICD-10-CM | POA: Diagnosis not present

## 2016-04-04 DIAGNOSIS — E119 Type 2 diabetes mellitus without complications: Secondary | ICD-10-CM | POA: Diagnosis not present

## 2016-04-04 DIAGNOSIS — R109 Unspecified abdominal pain: Secondary | ICD-10-CM

## 2016-04-04 DIAGNOSIS — Z7982 Long term (current) use of aspirin: Secondary | ICD-10-CM | POA: Diagnosis not present

## 2016-04-04 DIAGNOSIS — Z7984 Long term (current) use of oral hypoglycemic drugs: Secondary | ICD-10-CM | POA: Diagnosis not present

## 2016-04-04 LAB — CBC
HCT: 40.6 % (ref 36.0–46.0)
Hemoglobin: 13.1 g/dL (ref 12.0–15.0)
MCH: 28.8 pg (ref 26.0–34.0)
MCHC: 32.3 g/dL (ref 30.0–36.0)
MCV: 89.2 fL (ref 78.0–100.0)
Platelets: 243 10*3/uL (ref 150–400)
RBC: 4.55 MIL/uL (ref 3.87–5.11)
RDW: 13.3 % (ref 11.5–15.5)
WBC: 8.3 10*3/uL (ref 4.0–10.5)

## 2016-04-04 LAB — COMPREHENSIVE METABOLIC PANEL
ALT: 16 U/L (ref 14–54)
AST: 24 U/L (ref 15–41)
Albumin: 4.5 g/dL (ref 3.5–5.0)
Alkaline Phosphatase: 87 U/L (ref 38–126)
Anion gap: 8 (ref 5–15)
BUN: 25 mg/dL — ABNORMAL HIGH (ref 6–20)
CO2: 27 mmol/L (ref 22–32)
Calcium: 9.8 mg/dL (ref 8.9–10.3)
Chloride: 104 mmol/L (ref 101–111)
Creatinine, Ser: 1.18 mg/dL — ABNORMAL HIGH (ref 0.44–1.00)
GFR calc Af Amer: 51 mL/min — ABNORMAL LOW (ref >60)
GFR calc non Af Amer: 44 mL/min — ABNORMAL LOW (ref >60)
Glucose, Bld: 151 mg/dL — ABNORMAL HIGH (ref 65–99)
Potassium: 3.8 mmol/L (ref 3.5–5.1)
Sodium: 139 mmol/L (ref 135–145)
Total Bilirubin: 0.6 mg/dL (ref 0.3–1.2)
Total Protein: 8.6 g/dL — ABNORMAL HIGH (ref 6.5–8.1)

## 2016-04-04 LAB — URINALYSIS, ROUTINE W REFLEX MICROSCOPIC
Bilirubin Urine: NEGATIVE
Glucose, UA: NEGATIVE mg/dL
Hgb urine dipstick: NEGATIVE
Ketones, ur: NEGATIVE mg/dL
Leukocytes, UA: NEGATIVE
Nitrite: NEGATIVE
Protein, ur: NEGATIVE mg/dL
Specific Gravity, Urine: >1.03 — ABNORMAL HIGH (ref 1.005–1.030)
pH: 5.5 (ref 5.0–8.0)

## 2016-04-04 LAB — I-STAT CG4 LACTIC ACID, ED: Lactic Acid, Venous: 0.96 mmol/L (ref 0.5–1.9)

## 2016-04-04 LAB — LIPASE, BLOOD: Lipase: 47 U/L (ref 11–51)

## 2016-04-04 MED ORDER — SODIUM CHLORIDE 0.9 % IV BOLUS (SEPSIS)
500.0000 mL | Freq: Once | INTRAVENOUS | Status: AC
  Administered 2016-04-04: 500 mL via INTRAVENOUS

## 2016-04-04 MED ORDER — OMEPRAZOLE 20 MG PO CPDR: 20.0000 mg | DELAYED_RELEASE_CAPSULE | Freq: Every day | ORAL | 1 refills | Status: DC

## 2016-04-04 MED ORDER — DIATRIZOATE MEGLUMINE & SODIUM 66-10 % PO SOLN
ORAL | Status: AC
  Administered 2016-04-04: 22:00:00
  Filled 2016-04-04: qty 30

## 2016-04-04 MED ORDER — IOPAMIDOL (ISOVUE-300) INJECTION 61%
100.0000 mL | Freq: Once | INTRAVENOUS | Status: AC | PRN
  Administered 2016-04-04: 100 mL via INTRAVENOUS

## 2016-04-04 MED ORDER — MORPHINE SULFATE (PF) 2 MG/ML IV SOLN
2.0000 mg | Freq: Once | INTRAVENOUS | Status: AC
  Administered 2016-04-04: 2 mg via INTRAVENOUS
  Filled 2016-04-04: qty 1

## 2016-04-04 MED ORDER — SUCRALFATE 1 GM/10ML PO SUSP: 1.0000 g | Freq: Three times a day (TID) | ORAL | 0 refills | Status: DC

## 2016-04-04 NOTE — ED Notes (Signed)
Pt provided urine specimen at this time. Respirations even and unlabored. NAD noted.

## 2016-04-04 NOTE — ED Notes (Signed)
Pt to Xray.

## 2016-04-04 NOTE — ED Triage Notes (Signed)
Pt states she has been having abdominal pain with vomiting x 1 since 3pm today.

## 2016-04-04 NOTE — ED Provider Notes (Signed)
[facility]-EMERGENCY DEPT Provider Note   CSN: [id number] Arrival date & time: 04/04/16  1711  First MD Initiated Contact with Patient 04/04/16 2015     By signing my name below, I, Jasmyn B. Alexander, attest that this documentation has been prepared under the direction and in the presence of Maia Plan, MD. Electronically Signed: Gillis Ends. Lyn Hollingshead, ED Scribe. 04/04/16. 8:46 PM.  History   Chief Complaint Chief Complaint  Patient presents with  . Abdominal Pain    HPI HPI Comments: Cynthia Mays is a 75 y.o. female with PMHx of DM, HLD, and HTN who presents to the Emergency Department complaining of sudden onset, constant, burning, epigastric abdominal pain x 5hrs PTA. Pt states that she was mopping the kitchen when the pain occurred. Pt reports she had a "knot that popped out," size of a goose egg, but has resolved while in [facility]. She had associated diaphoresis, nausea, mild shortness of breath, and 1 episode of vomiting which have resolved. Pain is exacerbated with applying pressure to the area. She took Mylanta for symptoms with no relief. Denies any recent abdominal surgery. Pt reports that she has been having troubles with indigestion since her medicine was recently changed.    The history is provided by the patient. No language interpreter was used.    Past Medical History:  Diagnosis Date  . Complication of anesthesia   . Depression   . Diabetes mellitus   . Fibromyalgia   . Hyperlipidemia   . Hypertension   . Hypothyroidism   . PONV (postoperative nausea and vomiting)     Patient Active Problem List   Diagnosis Date Noted  . Left knee pain 10/24/2015  . Precordial pain 01/15/2013    Past Surgical History:  Procedure Laterality Date  . APPENDECTOMY  2002  . arthroscopy  right 2002, left 2007   bilateral  . BACK SURGERY  10/2007, 09/2008   x2  . CARPAL TUNNEL RELEASE  1986   bilateral  . CESAREAN SECTION  1969, 1967   x2  . KNEE SURGERY     both    OB  History    No data available       Home Medications    Prior to Admission medications   Medication Sig Start Date End Date Taking? Authorizing Provider  aspirin EC 81 MG tablet Take 81 mg by mouth daily.     Yes Historical Provider, MD  esomeprazole (NEXIUM) 20 MG capsule Take 20 mg by mouth daily before breakfast.     Yes Historical Provider, MD  fexofenadine (ALLEGRA) 180 MG tablet Take 180 mg by mouth daily.   Yes Historical Provider, MD  fluticasone (FLONASE) 50 MCG/ACT nasal spray Place 2 sprays into the nose daily as needed for allergies.    Yes Historical Provider, MD  hydrochlorothiazide (HYDRODIURIL) 25 MG tablet Take 25 mg by mouth daily.     Yes Historical Provider, MD  HYDROcodone-acetaminophen (NORCO/VICODIN) 5-325 MG tablet Take 1 tablet by mouth every 6 (six) hours as needed for moderate pain.   Yes Historical Provider, MD  levothyroxine (SYNTHROID, LEVOTHROID) 100 MCG tablet Take 100 mcg by mouth daily before breakfast.   Yes Historical Provider, MD  lisinopril (PRINIVIL,ZESTRIL) 10 MG tablet Take 10 mg by mouth daily.     Yes Historical Provider, MD  metFORMIN (GLUCOPHAGE) 500 MG tablet Take 500 mg by mouth daily with breakfast.    Yes Historical Provider, MD  Multiple Vitamins-Minerals (MULTIVITAMINS THER. W/MINERALS) TABS Take 1 tablet  by mouth daily.     Yes Historical Provider, MD  NIFEdipine (PROCARDIA XL/ADALAT-CC) 60 MG 24 hr tablet Take 60 mg by mouth daily.     Yes Historical Provider, MD  pravastatin (PRAVACHOL) 40 MG tablet Take 40 mg by mouth every evening.    Yes Historical Provider, MD  sertraline (ZOLOFT) 50 MG tablet Take 50 mg by mouth daily.     Yes Historical Provider, MD    Family History Family History  Problem Relation Age of Onset  . Anesthesia problems Mother   . CAD Mother 63  . Cancer Mother     Pancreatic cancer  . Hypertension Father   . Hypotension Neg Hx   . Pseudochol deficiency Neg Hx   . Malignant hyperthermia Neg Hx     Social  History Social History  Substance Use Topics  . Smoking status: Former Smoker    Packs/day: 1.00    Years: 43.00    Types: Cigarettes  . Smokeless tobacco: Not on file     Comment: Quit about 20 years ago.   . Alcohol use No     Comment: occasionally wine     Allergies   Codeine and Penicillins   Review of Systems Review of Systems  Constitutional: Positive for diaphoresis. Negative for fever.  Respiratory: Positive for shortness of breath.   Gastrointestinal: Positive for abdominal pain, nausea and vomiting.  All other systems reviewed and are negative.  Physical Exam Updated Vital Signs BP 111/62   Pulse (!) 56   Temp 98.2 F (36.8 C) (Tympanic)   Resp 18   Ht 4\' 11"  (1.499 m)   Wt 200 lb (90.7 kg)   SpO2 94%   BMI 40.40 kg/m   Physical Exam  Constitutional: Alert and oriented. Well appearing and in no acute distress. Eyes: Conjunctivae are normal. PERRL. EOMI. Head: Atraumatic. Nose: No congestion/rhinnorhea. Mouth/Throat: Mucous membranes are moist.  Oropharynx non-erythematous. Neck: No stridor.  Cardiovascular: Normal rate, regular rhythm. Good peripheral circulation. Grossly normal heart sounds.   Respiratory: Normal respiratory effort.  No retractions. Lungs CTAB. Gastrointestinal: Soft with focal epigastric tenderness to palpation. No rebound or guarding. No obvious hernia or other mass. No distention.  Musculoskeletal: No lower extremity tenderness nor edema. No gross deformities of extremities. Neurologic:  Normal speech and language. No gross focal neurologic deficits are appreciated.  Skin:  Skin is warm, dry and intact. No rash noted. Psychiatric: Mood and affect are normal. Speech and behavior are normal.  ED Treatments / Results  DIAGNOSTIC STUDIES: Oxygen Saturation is 96% on RA, normal by my interpretation.    COORDINATION OF CARE: 8:39 PM-Discussed treatment plan which includes order of Morphine, Troponin, Lactic Acid, and CT Abd/Pel with  Contrast with pt at bedside and pt agreed to plan.   Labs (all labs ordered are listed, but only abnormal results are displayed) Labs Reviewed  COMPREHENSIVE METABOLIC PANEL - Abnormal; Notable for the following:       Result Value   Glucose, Bld 151 (*)    BUN 25 (*)    Creatinine, Ser 1.18 (*)    Total Protein 8.6 (*)    GFR calc non Af Amer 44 (*)    GFR calc Af Amer 51 (*)    All other components within normal limits  URINALYSIS, ROUTINE W REFLEX MICROSCOPIC (NOT AT [facility]) - Abnormal; Notable for the following:    Specific Gravity, Urine >1.030 (*)    All other components within normal limits  LIPASE, BLOOD  CBC  I-STAT TROPOININ, ED  I-STAT CG4 LACTIC ACID, ED    Radiology Ct Abdomen Pelvis W Contrast  Result Date: 04/04/2016 CLINICAL DATA:  Sudden onset of epigastric abdominal pain today. Nausea, vomiting and diaphoresis. EXAM: CT ABDOMEN AND PELVIS WITH CONTRAST TECHNIQUE: Multidetector CT imaging of the abdomen and pelvis was performed using the standard protocol following bolus administration of intravenous contrast. CONTRAST:  ISOVUE-300 IOPAMIDOL (ISOVUE-300) INJECTION 61% COMPARISON:  CT 06/04/2012 FINDINGS: Lower chest: Minimal lingular atelectasis. Coronary artery calcifications versus coronary stents. Liver: 12 mm cyst in the right hepatic dome. Hepatobiliary: Gallbladder physiologically distended, no calcified stone. No biliary dilatation. Pancreas: No ductal dilatation or inflammation. Spleen: Normal. Adrenal glands: No nodule. Kidneys: Symmetric renal enhancement. No hydronephrosis. 13 mm cyst in the upper right kidney. Tiny cortical hypodensity in the mid right kidney is too small to characterize. Symmetric excretion on delayed phase imaging. Stomach/Bowel: Contrast in the distal esophagus may be delayed transit or reflux. Stomach physiologically distended. There are no dilated or thickened small bowel loops. Small volume of stool throughout the colon without colonic  wall thickening. Minimal sigmoid colonic diverticulosis without diverticulitis. The appendix is surgically absent. Vascular/Lymphatic: No retroperitoneal adenopathy. Abdominal aorta is normal in caliber. Moderate atherosclerosis without aneurysm. Reproductive: Calcified uterine fibroids. No adnexal mass. Ovaries tentatively identified and symmetric in size. Bladder: Physiologically distended without wall thickening. Other: No free air, free fluid, or intra-abdominal fluid collection. Small fat containing umbilical hernia. Fat within both inguinal canals. Musculoskeletal: There are no acute or suspicious osseous abnormalities. Posterior L3 through L5 fusion with interbody spacers. IMPRESSION: 1. Enteric contrast in the distal esophagus, may be delayed transit or reflux. There is otherwise no acute abnormality. 2. Abdominal aortic atherosclerosis. Coronary artery calcifications. Electronically Signed   By: Rubye Oaks M.D.   On: 04/04/2016 21:54    Procedures Procedures (including critical care time)  Medications Ordered in ED Medications  sodium chloride 0.9 % bolus 500 mL (0 mLs Intravenous Stopped 04/04/16 2208)  morphine 2 MG/ML injection 2 mg (2 mg Intravenous Given 04/04/16 2112)  iopamidol (ISOVUE-300) 61 % injection 100 mL (100 mLs Intravenous Contrast Given 04/04/16 2128)  diatrizoate meglumine-sodium (GASTROGRAFIN) 66-10 % solution (  Given 04/04/16 2145)   Initial Impression / Assessment and Plan / ED Course  I have reviewed the triage vital signs and the nursing notes.  Pertinent labs & imaging results that were available during my care of the patient were reviewed by me and considered in my medical decision making (see chart for details).  Clinical Course   Patient presents to the ED for evaluation of sudden onset epigastric discomfort and nausea.  Differential diagnosis includes but is not exclusive to acute cholecystitis, intrathoracic causes for epigastric abdominal pain, gastritis,  duodenitis, pancreatitis, small bowel or large bowel obstruction, abdominal aortic aneurysm, hernia, gastritis, etc.  Given the patient's age and focal tenderness I plan for CT scan of the abdomen and pelvis along with symptomatic management in the ED. Low suspicion for atypical ACS with palpable tenderness on exam and no other features that suggest ACS.   CT scan performed and negative for acute intrabdominal pain etiology. Question reflux. Will treat accordingly. Patient feeling improved. Tolerating PO. Plan for discharge home with Carafate, PPI, and PCP follow up.   At this time, I do not feel there is any life-threatening condition present. I have reviewed and discussed all results (EKG, imaging, lab, urine as appropriate), exam findings with patient. I have reviewed nursing notes and  appropriate previous records.  I feel the patient is safe to be discharged home without further emergent workup. Discussed usual and customary return precautions. Patient and family (if present) verbalize understanding and are comfortable with this plan.  Patient will follow-up with their primary care provider. If they do not have a primary care provider, information for follow-up has been provided to them. All questions have been answered.   Final Clinical Impressions(s) / ED Diagnoses   Final diagnoses:  Abdominal pain    New Prescriptions Discharge Medication List as of 04/04/2016 11:46 PM    START taking these medications   Details  omeprazole (PRILOSEC) 20 MG capsule Take 1 capsule (20 mg total) by mouth daily., Starting Fri 04/04/2016, Print    sucralfate (CARAFATE) 1 GM/10ML suspension Take 10 mLs (1 g total) by mouth 4 (four) times daily -  with meals and at bedtime., Starting Fri 04/04/2016, Print       Performed the above documentation with the assistance of a scribe. Reviewed document for completeness and accuracy.   Alona Bene, MD   Maia Plan, MD 04/05/16 [phone]

## 2016-04-04 NOTE — Discharge Instructions (Signed)

## 2016-04-04 NOTE — ED Notes (Signed)
Pt in lobby, respirations are even and unlabored, NAD noted.

## 2016-04-16 ENCOUNTER — Ambulatory Visit (HOSPITAL_COMMUNITY)
Admission: RE | Admit: 2016-04-16 | Discharge: 2016-04-16 | Disposition: A | Discharge status: Home / Self Care | Payer: Medicare Other | Source: Ambulatory Visit | Attending: Orthopaedic Surgery | Admitting: Orthopaedic Surgery

## 2016-04-16 DIAGNOSIS — M25561 Pain in right knee: Secondary | ICD-10-CM | POA: Insufficient documentation

## 2016-04-16 DIAGNOSIS — R937 Abnormal findings on diagnostic imaging of other parts of musculoskeletal system: Secondary | ICD-10-CM | POA: Insufficient documentation

## 2016-04-16 DIAGNOSIS — M179 Osteoarthritis of knee, unspecified: Secondary | ICD-10-CM | POA: Diagnosis not present

## 2016-04-22 ENCOUNTER — Ambulatory Visit (INDEPENDENT_AMBULATORY_CARE_PROVIDER_SITE_OTHER): Payer: Medicare Other | Admitting: Orthopaedic Surgery

## 2016-04-22 ENCOUNTER — Encounter: Payer: Self-pay | Admitting: Orthopaedic Surgery

## 2016-04-22 VITALS — BP 113/72 | HR 60 | Temp 97.7°F | Ht 59.0 in | Wt 200.0 lb

## 2016-04-22 DIAGNOSIS — M25561 Pain in right knee: Secondary | ICD-10-CM | POA: Diagnosis not present

## 2016-04-22 NOTE — Progress Notes (Signed)
Patient VW:[id number] Cynthia Mays, female DOB:12/17/1940, 75 y.o. [id number]  Chief Complaint  Patient presents with  . Results    MRI RIGHT KNEE    HPI  Cynthia Mays is a 75 y.o. female who has right knee pain laterally with some pain to the mid right lateral calf.  She had a MRI of the right knee which shows more changes in the medial meniscus with perhaps a re-tear of the remaining portion of the medial meniscus.She has no pain medially.  I have told her of the findings of the MRI.  No lateral meniscus tear was seen.  I will hold off on any consideration of arthroscopy at this time.  She agrees.  She is to continue the BioFreeze to the lateral knee and calf. Continue her medicine. HPI  Body mass index is 40.4 kg/m.  ROS  Review of Systems  Constitutional:       Patient has Diabetes Mellitus. Patient has hypertension. Patient has COPD or shortness of breath. Patient has BMI > 35. Patient does not have current smoking history.  Respiratory: Positive for shortness of breath.   Cardiovascular: Negative for leg swelling.  Endocrine: Positive for cold intolerance.  Musculoskeletal: Positive for arthralgias, gait problem and joint swelling.  Allergic/Immunologic: Positive for environmental allergies.    Past Medical History:  Diagnosis Date  . Complication of anesthesia   . Depression   . Diabetes mellitus   . Fibromyalgia   . Hyperlipidemia   . Hypertension   . Hypothyroidism   . PONV (postoperative nausea and vomiting)     Past Surgical History:  Procedure Laterality Date  . APPENDECTOMY  2002  . arthroscopy  right 2002, left 2007   bilateral  . BACK SURGERY  10/2007, 09/2008   x2  . CARPAL TUNNEL RELEASE  1986   bilateral  . CESAREAN SECTION  1969, 1967   x2  . KNEE SURGERY     both    Family History  Problem Relation Age of Onset  . Anesthesia problems Mother   . CAD Mother 51  . Cancer Mother     Pancreatic cancer  . Hypertension Father   .  Hypotension Neg Hx   . Pseudochol deficiency Neg Hx   . Malignant hyperthermia Neg Hx     Social History Social History  Substance Use Topics  . Smoking status: Former Smoker    Packs/day: 1.00    Years: 43.00    Types: Cigarettes  . Smokeless tobacco: Never Used     Comment: Quit about 20 years ago.   . Alcohol use No     Comment: occasionally wine    Allergies  Allergen Reactions  . Codeine Other (See Comments)    Headache, gi upset  . Penicillins Rash    Has patient had a PCN reaction causing immediate rash, facial/tongue/throat swelling, SOB or lightheadedness with hypotension: Yes Has patient had a PCN reaction causing severe rash involving mucus membranes or skin necrosis: No Has patient had a PCN reaction that required hospitalization No Has patient had a PCN reaction occurring within the last 10 years: No If all of the above answers are "NO", then may proceed with Cephalosporin use.     Current Outpatient Prescriptions  Medication Sig Dispense Refill  . aspirin EC 81 MG tablet Take 81 mg by mouth daily.      Marland Kitchen esomeprazole (NEXIUM) 20 MG capsule Take 20 mg by mouth daily before breakfast.      . fexofenadine (ALLEGRA)  180 MG tablet Take 180 mg by mouth daily.    . fluticasone (FLONASE) 50 MCG/ACT nasal spray Place 2 sprays into the nose daily as needed for allergies.     . hydrochlorothiazide (HYDRODIURIL) 25 MG tablet Take 25 mg by mouth daily.      Marland Kitchen. HYDROcodone-acetaminophen (NORCO/VICODIN) 5-325 MG tablet Take 1 tablet by mouth every 6 (six) hours as needed for moderate pain.    Marland Kitchen. levothyroxine (SYNTHROID, LEVOTHROID) 100 MCG tablet Take 100 mcg by mouth daily before breakfast.    . lisinopril (PRINIVIL,ZESTRIL) 10 MG tablet Take 10 mg by mouth daily.      . metFORMIN (GLUCOPHAGE) 500 MG tablet Take 500 mg by mouth daily with breakfast.     . Multiple Vitamins-Minerals (MULTIVITAMINS THER. W/MINERALS) TABS Take 1 tablet by mouth daily.      Marland Kitchen. NIFEdipine  (PROCARDIA XL/ADALAT-CC) 60 MG 24 hr tablet Take 60 mg by mouth daily.      Marland Kitchen. omeprazole (PRILOSEC) 20 MG capsule Take 1 capsule (20 mg total) by mouth daily. 30 capsule 1  . pravastatin (PRAVACHOL) 40 MG tablet Take 40 mg by mouth every evening.     . sertraline (ZOLOFT) 50 MG tablet Take 50 mg by mouth daily.      . sucralfate (CARAFATE) 1 GM/10ML suspension Take 10 mLs (1 g total) by mouth 4 (four) times daily -  with meals and at bedtime. 420 mL 0   No current facility-administered medications for this visit.      Physical Exam  Blood pressure 113/72, pulse 60, temperature 97.7 F (36.5 C), height 4\' 11"  (1.499 m), weight 200 lb (90.7 kg).  Constitutional: overall normal hygiene, normal nutrition, well developed, normal grooming, normal body habitus. Assistive device:none  Musculoskeletal: gait and station Limp right, muscle tone and strength are normal, no tremors or atrophy is present.  .  Neurological: coordination overall normal.  Deep tendon reflex/nerve stretch intact.  Sensation normal.  Cranial nerves II-XII intact.   Skin:   normal overall no scars, lesions, ulcers or rashes. No psoriasis.  Psychiatric: Alert and oriented x 3.  Recent memory intact, remote memory unclear.  Normal mood and affect. Well groomed.  Good eye contact.  Cardiovascular: overall no swelling, no varicosities, no edema bilaterally, normal temperatures of the legs and arms, no clubbing, cyanosis and good capillary refill.  Lymphatic: palpation is normal.  The right lower extremity is examined:  Inspection:  Thigh:  Non-tender and no defects  Knee has swelling 1+ effusion.                        Joint tenderness is present                        Patient is tender over the medial joint line  Lower Leg:  Has normal appearance and no tenderness or defects  Ankle:  Non-tender and no defects  Foot:  Non-tender and no defects Range of Motion:  Knee:  Range of motion is: 0-105                         Crepitus is  present  Ankle:  Range of motion is normal. Strength and Tone:  The right lower extremity has normal strength and tone. Stability:  Knee:  The knee is stable.  Ankle:  The ankle is stable.    The patient has been educated about the  nature of the problem(s) and counseled on treatment options.  The patient appeared to understand what I have discussed and is in agreement with it.  Encounter Diagnosis  Name Primary?  . Right knee pain Yes    PLAN Call if any problems.  Precautions discussed.  Continue current medications.   Return to clinic 2 weeks   Electronically Signed Cynthia McleanWayne Idalys Konecny, MD 8/22/201710:52 AM

## 2016-05-06 ENCOUNTER — Encounter: Payer: Self-pay | Admitting: Orthopaedic Surgery

## 2016-05-06 ENCOUNTER — Ambulatory Visit (INDEPENDENT_AMBULATORY_CARE_PROVIDER_SITE_OTHER): Payer: Medicare Other | Admitting: Orthopaedic Surgery

## 2016-05-06 VITALS — BP 108/70 | HR 69 | Temp 97.7°F | Ht 59.0 in | Wt 201.8 lb

## 2016-05-06 DIAGNOSIS — M25561 Pain in right knee: Secondary | ICD-10-CM

## 2016-05-06 NOTE — Progress Notes (Signed)
Patient Cynthia Mays [name], female DOB:September 15, 1940, 75 y.o. [id number]  Chief Complaint  Patient presents with  . Follow-up    Right knee pain    HPI  Cynthia Mays is a 75 y.o. female who has right knee pain.  Prior MRI shows re-tear of the medial meniscus on the right.  Her pain is laterally.  She wanted to wait and see how she does.  She is doing well.  She has some pain at times.  She has less giving way and popping.  She is using BioFreeze. She says she does not want any surgery at this time. I went over precautions with her. HPI  Body mass index is 40.76 kg/m.  ROS  Review of Systems  Constitutional:       Patient has Diabetes Mellitus. Patient has hypertension. Patient has COPD or shortness of breath. Patient has BMI > 35. Patient does not have current smoking history.  Respiratory: Positive for shortness of breath.   Cardiovascular: Negative for leg swelling.  Endocrine: Positive for cold intolerance.  Musculoskeletal: Positive for arthralgias, gait problem and joint swelling.  Allergic/Immunologic: Positive for environmental allergies.    Past Medical History:  Diagnosis Date  . Complication of anesthesia   . Depression   . Diabetes mellitus   . Fibromyalgia   . Hyperlipidemia   . Hypertension   . Hypothyroidism   . PONV (postoperative nausea and vomiting)     Past Surgical History:  Procedure Laterality Date  . APPENDECTOMY  2002  . arthroscopy  right 2002, left 2007   bilateral  . BACK SURGERY  10/2007, 09/2008   x2  . CARPAL TUNNEL RELEASE  1986   bilateral  . CESAREAN SECTION  1969, 1967   x2  . KNEE SURGERY     both    Family History  Problem Relation Age of Onset  . Anesthesia problems Mother   . CAD Mother 53  . Cancer Mother     Pancreatic cancer  . Hypertension Father   . Hypotension Neg Hx   . Pseudochol deficiency Neg Hx   . Malignant hyperthermia Neg Hx     Social History Social History  Substance Use Topics  .  Smoking status: Former Smoker    Packs/day: 1.00    Years: 43.00    Types: Cigarettes  . Smokeless tobacco: Never Used     Comment: Quit about 20 years ago.   . Alcohol use No     Comment: occasionally wine    Allergies  Allergen Reactions  . Codeine Other (See Comments)    Headache, gi upset  . Penicillins Rash    Has patient had a PCN reaction causing immediate rash, facial/tongue/throat swelling, SOB or lightheadedness with hypotension: Yes Has patient had a PCN reaction causing severe rash involving mucus membranes or skin necrosis: No Has patient had a PCN reaction that required hospitalization No Has patient had a PCN reaction occurring within the last 10 years: No If all of the above answers are "NO", then may proceed with Cephalosporin use.     Current Outpatient Prescriptions  Medication Sig Dispense Refill  . aspirin EC 81 MG tablet Take 81 mg by mouth daily.      Marland Kitchen esomeprazole (NEXIUM) 20 MG capsule Take 20 mg by mouth daily before breakfast.      . fexofenadine (ALLEGRA) 180 MG tablet Take 180 mg by mouth daily.    . fluticasone (FLONASE) 50 MCG/ACT nasal spray Place 2 sprays into the  nose daily as needed for allergies.     . hydrochlorothiazide (HYDRODIURIL) 25 MG tablet Take 25 mg by mouth daily.      Marland Kitchen HYDROcodone-acetaminophen (NORCO/VICODIN) 5-325 MG tablet Take 1 tablet by mouth every 6 (six) hours as needed for moderate pain.    Marland Kitchen levothyroxine (SYNTHROID, LEVOTHROID) 100 MCG tablet Take 100 mcg by mouth daily before breakfast.    . lisinopril (PRINIVIL,ZESTRIL) 10 MG tablet Take 10 mg by mouth daily.      . metFORMIN (GLUCOPHAGE) 500 MG tablet Take 500 mg by mouth daily with breakfast.     . Multiple Vitamins-Minerals (MULTIVITAMINS THER. W/MINERALS) TABS Take 1 tablet by mouth daily.      Marland Kitchen NIFEdipine (PROCARDIA XL/ADALAT-CC) 60 MG 24 hr tablet Take 60 mg by mouth daily.      Marland Kitchen omeprazole (PRILOSEC) 20 MG capsule Take 1 capsule (20 mg total) by mouth daily.  30 capsule 1  . pravastatin (PRAVACHOL) 40 MG tablet Take 40 mg by mouth every evening.     . sertraline (ZOLOFT) 50 MG tablet Take 50 mg by mouth daily.      . sucralfate (CARAFATE) 1 GM/10ML suspension Take 10 mLs (1 g total) by mouth 4 (four) times daily -  with meals and at bedtime. 420 mL 0   No current facility-administered medications for this visit.      Physical Exam  Blood pressure 108/70, pulse 69, temperature 97.7 F (36.5 C), height 4\' 11"  (1.499 m), weight 201 lb 12.8 oz (91.5 kg).  Constitutional: overall normal hygiene, normal nutrition, well developed, normal grooming, normal body habitus. Assistive device:none  Musculoskeletal: gait and station Limp right, muscle tone and strength are normal, no tremors or atrophy is present.  .  Neurological: coordination overall normal.  Deep tendon reflex/nerve stretch intact.  Sensation normal.  Cranial nerves II-XII intact.   Skin:   normal overall no scars, lesions, ulcers or rashes. No psoriasis.  Psychiatric: Alert and oriented x 3.  Recent memory intact, remote memory unclear.  Normal mood and affect. Well groomed.  Good eye contact.  Cardiovascular: overall no swelling, no varicosities, no edema bilaterally, normal temperatures of the legs and arms, no clubbing, cyanosis and good capillary refill.  Lymphatic: palpation is normal.  The right lower extremity is examined:  Inspection:  Thigh:  Non-tender and no defects  Knee has swelling 1+ effusion.                        Joint tenderness is present                        Patient is tender over the medial joint line  Lower Leg:  Has normal appearance and no tenderness or defects  Ankle:  Non-tender and no defects  Foot:  Non-tender and no defects Range of Motion:  Knee:  Range of motion is: 0-105                        Crepitus is  present  Ankle:  Range of motion is normal. Strength and Tone:  The right lower extremity has normal strength and  tone. Stability:  Knee:  The knee is stable.  Ankle:  The ankle is stable.    The patient has been educated about the nature of the problem(s) and counseled on treatment options.  The patient appeared to understand what I have discussed and is in  agreement with it.  Encounter Diagnosis  Name Primary?  . Right knee pain Yes    PLAN Call if any problems.  Precautions discussed.  Continue current medications.   Return to clinic 6 weeks   Electronically Signed Darreld McleanWayne Tihanna Goodson, MD 9/5/201710:20 AM

## 2016-05-27 ENCOUNTER — Encounter: Payer: Self-pay | Admitting: Obstetrics & Gynecology

## 2016-05-27 ENCOUNTER — Ambulatory Visit (INDEPENDENT_AMBULATORY_CARE_PROVIDER_SITE_OTHER): Payer: Medicare Other | Admitting: Obstetrics & Gynecology

## 2016-05-27 VITALS — BP 100/70 | HR 72 | Ht 59.0 in | Wt 203.0 lb

## 2016-05-27 DIAGNOSIS — L900 Lichen sclerosus et atrophicus: Secondary | ICD-10-CM

## 2016-05-27 MED ORDER — CLOBETASOL PROPIONATE 0.05 % EX CREA: 1.0000 "application " | TOPICAL_CREAM | Freq: Two times a day (BID) | CUTANEOUS | 11 refills | Status: DC

## 2016-05-27 NOTE — Progress Notes (Signed)
Chief Complaint  Patient presents with  . discuss medication    Blood pressure 100/70, pulse 72, height 4\' 11"  (1.499 m), weight 203 lb (92.1 kg).  75 y.o. No obstetric history on file. No LMP recorded. Patient is postmenopausal. The current method of family planning is post menopausal status.  Outpatient Encounter Prescriptions as of 05/27/2016  Medication Sig  . aspirin EC 81 MG tablet Take 81 mg by mouth daily.    Marland Kitchen esomeprazole (NEXIUM) 20 MG capsule Take 20 mg by mouth daily before breakfast.    . fexofenadine (ALLEGRA) 180 MG tablet Take 180 mg by mouth daily.  . fluticasone (FLONASE) 50 MCG/ACT nasal spray Place 2 sprays into the nose daily as needed for allergies.   . hydrochlorothiazide (HYDRODIURIL) 25 MG tablet Take 25 mg by mouth daily.    Marland Kitchen HYDROcodone-acetaminophen (NORCO/VICODIN) 5-325 MG tablet Take 1 tablet by mouth every 6 (six) hours as needed for moderate pain.  Marland Kitchen levothyroxine (SYNTHROID, LEVOTHROID) 100 MCG tablet Take 100 mcg by mouth daily before breakfast.  . lisinopril (PRINIVIL,ZESTRIL) 10 MG tablet Take 10 mg by mouth daily.    . metFORMIN (GLUCOPHAGE) 500 MG tablet Take 500 mg by mouth daily with breakfast.   . Multiple Vitamins-Minerals (MULTIVITAMINS THER. W/MINERALS) TABS Take 1 tablet by mouth daily.    Marland Kitchen NIFEdipine (PROCARDIA XL/ADALAT-CC) 60 MG 24 hr tablet Take 60 mg by mouth daily.    Marland Kitchen omeprazole (PRILOSEC) 20 MG capsule Take 1 capsule (20 mg total) by mouth daily.  . pravastatin (PRAVACHOL) 40 MG tablet Take 40 mg by mouth every evening.   . sertraline (ZOLOFT) 50 MG tablet Take 50 mg by mouth daily.    . sucralfate (CARAFATE) 1 GM/10ML suspension Take 10 mLs (1 g total) by mouth 4 (four) times daily -  with meals and at bedtime.  . clobetasol cream (TEMOVATE) 0.05 % Apply 1 application topically 2 (two) times daily.   No facility-administered encounter medications on file as of 05/27/2016.     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 clobetasol cream (TEMOVATE) 0.05 %    Sig: Apply 1 application topically 2 (two) times daily.    Dispense:  30 g    Refill:  11    Labs or Scans Ordered: No orders of the defined types were placed in this encounter.   Management::   Follow up Return in about 1 year (around 05/27/2017) for Follow up, with Dr Despina Hidden.   All questions were answered.  Past Medical History:  Diagnosis Date  . Complication of anesthesia   . Depression   . Diabetes mellitus   . Fibromyalgia   . Hyperlipidemia   . Hypertension   . Hypothyroidism   . PONV (postoperative nausea and vomiting)     Past  Surgical History:  Procedure Laterality Date  . APPENDECTOMY  2002  . arthroscopy  right 2002, left 2007   bilateral  . BACK SURGERY  10/2007, 09/2008   x2  . CARPAL TUNNEL RELEASE  1986   bilateral  . CESAREAN SECTION  1969, 1967   x2  . KNEE SURGERY     both    OB History    No data available      Allergies  Allergen Reactions  . Codeine Other (See Comments)    Headache, gi upset  . Penicillins Rash    Has patient had a PCN reaction causing immediate rash, facial/tongue/throat swelling, SOB or lightheadedness with hypotension: Yes Has patient had a PCN reaction causing severe rash involving  mucus membranes or skin necrosis: No Has patient had a PCN reaction that required hospitalization No Has patient had a PCN reaction occurring within the last 10 years: No If all of the above answers are "NO", then may proceed with Cephalosporin use.     Social History   Social History  . Marital status: Widowed    Spouse name: N/A  . Number of children: 4  . Years of education: N/A   Occupational History  .  Retired   Social History Main Topics  . Smoking status: Former Smoker    Packs/day: 1.00    Years: 43.00    Types: Cigarettes  . Smokeless tobacco: Never Used     Comment: Quit about 20 years ago.   . Alcohol use No     Comment: occasionally wine  . Drug use: No  . Sexual activity: Yes    Birth control/ protection: Post-menopausal   Other Topics Concern  . None   Social History Narrative   Lives alone.      Family History  Problem Relation Age of Onset  . Anesthesia problems Mother   . CAD Mother [id number]  . Cancer Mother     Pancreatic cancer  . Hypertension Father   . Hypotension Neg Hx   . Pseudochol deficiency Neg Hx   . Malignant hyperthermia Neg Hx

## 2016-06-17 ENCOUNTER — Ambulatory Visit: Payer: Medicare Other | Admitting: Orthopaedic Surgery

## 2016-06-24 ENCOUNTER — Ambulatory Visit (INDEPENDENT_AMBULATORY_CARE_PROVIDER_SITE_OTHER): Payer: Medicare Other | Admitting: Orthopaedic Surgery

## 2016-06-24 ENCOUNTER — Encounter: Payer: Self-pay | Admitting: Orthopaedic Surgery

## 2016-06-24 VITALS — BP 131/88 | HR 64 | Ht 59.0 in | Wt 203.0 lb

## 2016-06-24 DIAGNOSIS — G8929 Other chronic pain: Secondary | ICD-10-CM | POA: Diagnosis not present

## 2016-06-24 DIAGNOSIS — M25561 Pain in right knee: Secondary | ICD-10-CM | POA: Diagnosis not present

## 2016-06-24 NOTE — Progress Notes (Signed)
Patient Cynthia Mays [name], female DOB:1941/07/18, 75 y.o. JWJ:[id number]  Chief Complaint  Patient presents with  . Follow-up    Right knee    HPI  Cynthia Mays is a 75 y.o. female who has intermittent chronic pain of the lateral side of the right knee and right calf.  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  She went to the beach last week and did considerable walking with no problem. She has no new trauma.  She has no swelling. HPI  Body mass index is 41 kg/m.  ROS  Review of Systems  Constitutional:       Patient has Diabetes Mellitus. Patient has hypertension. Patient has COPD or shortness of breath. Patient has BMI > 35. Patient does not have current smoking history.  Respiratory: Positive for shortness of breath.   Cardiovascular: Negative for leg swelling.  Endocrine: Positive for cold intolerance.  Musculoskeletal: Positive for arthralgias, gait problem and joint swelling.  Allergic/Immunologic: Positive for environmental allergies.    Past Medical History:  Diagnosis Date  . Complication of anesthesia   . Depression   . Diabetes mellitus   . Fibromyalgia   . Hyperlipidemia   . Hypertension   . Hypothyroidism   . PONV (postoperative nausea and vomiting)     Past Surgical History:  Procedure Laterality Date  . APPENDECTOMY  2002  . arthroscopy  right 2002, left 2007   bilateral  . BACK SURGERY  10/2007, 09/2008   x2  . CARPAL TUNNEL RELEASE  1986   bilateral  . CESAREAN SECTION  1969, 1967   x2  . KNEE SURGERY     both    Family History  Problem Relation Age of Onset  . Anesthesia problems Mother   . CAD Mother 58  . Cancer Mother     Pancreatic cancer  . Hypertension Father   . Hypotension Neg Hx   . Pseudochol deficiency Neg Hx   . Malignant hyperthermia Neg Hx     Social History Social History  Substance Use Topics  . Smoking status: Former Smoker    Packs/day: 1.00    Years: 43.00    Types: Cigarettes  . Smokeless  tobacco: Never Used     Comment: Quit about 20 years ago.   . Alcohol use No     Comment: occasionally wine    Allergies  Allergen Reactions  . Codeine Other (See Comments)    Headache, gi upset  . Penicillins Rash    Has patient had a PCN reaction causing immediate rash, facial/tongue/throat swelling, SOB or lightheadedness with hypotension: Yes Has patient had a PCN reaction causing severe rash involving mucus membranes or skin necrosis: No Has patient had a PCN reaction that required hospitalization No Has patient had a PCN reaction occurring within the last 10 years: No If all of the above answers are "NO", then may proceed with Cephalosporin use.     Current Outpatient Prescriptions  Medication Sig Dispense Refill  . aspirin EC 81 MG tablet Take 81 mg by mouth daily.      . clobetasol cream (TEMOVATE) 0.05 % Apply 1 application topically 2 (two) times daily. 30 g 11  . esomeprazole (NEXIUM) 20 MG capsule Take 20 mg by mouth daily before breakfast.      . fexofenadine (ALLEGRA) 180 MG tablet Take 180 mg by mouth daily.    . fluticasone (FLONASE) 50 MCG/ACT nasal spray Place 2 sprays into the nose daily as needed for allergies.     Marland Kitchen  hydrochlorothiazide (HYDRODIURIL) 25 MG tablet Take 25 mg by mouth daily.      Marland Kitchen HYDROcodone-acetaminophen (NORCO/VICODIN) 5-325 MG tablet Take 1 tablet by mouth every 6 (six) hours as needed for moderate pain.    Marland Kitchen levothyroxine (SYNTHROID, LEVOTHROID) 100 MCG tablet Take 100 mcg by mouth daily before breakfast.    . lisinopril (PRINIVIL,ZESTRIL) 10 MG tablet Take 10 mg by mouth daily.      . metFORMIN (GLUCOPHAGE) 500 MG tablet Take 500 mg by mouth daily with breakfast.     . Multiple Vitamins-Minerals (MULTIVITAMINS THER. W/MINERALS) TABS Take 1 tablet by mouth daily.      Marland Kitchen NIFEdipine (PROCARDIA XL/ADALAT-CC) 60 MG 24 hr tablet Take 60 mg by mouth daily.      Marland Kitchen omeprazole (PRILOSEC) 20 MG capsule Take 1 capsule (20 mg total) by mouth daily. 30  capsule 1  . pravastatin (PRAVACHOL) 40 MG tablet Take 40 mg by mouth every evening.     . sertraline (ZOLOFT) 50 MG tablet Take 50 mg by mouth daily.      . sucralfate (CARAFATE) 1 GM/10ML suspension Take 10 mLs (1 g total) by mouth 4 (four) times daily -  with meals and at bedtime. 420 mL 0   No current facility-administered medications for this visit.      Physical Exam  Blood pressure 131/88, pulse 64, height 4\' 11"  (1.499 m), weight 203 lb (92.1 kg).  Constitutional: overall normal hygiene, normal nutrition, well developed, normal grooming, normal body habitus. Assistive device:none  Musculoskeletal: gait and station Limp none, muscle tone and strength are normal, no tremors or atrophy is present.  .  Neurological: coordination overall normal.  Deep tendon reflex/nerve stretch intact.  Sensation normal.  Cranial nerves II-XII intact.   Skin:   Normal overall no scars, lesions, ulcers or rashes. No psoriasis.  Psychiatric: Alert and oriented x 3.  Recent memory intact, remote memory unclear.  Normal mood and affect. Well groomed.  Good eye contact.  Cardiovascular: overall no swelling, no varicosities, no edema bilaterally, normal temperatures of the legs and arms, no clubbing, cyanosis and good capillary refill.  Lymphatic: palpation is normal. The right lower extremity is examined:  Inspection:  Thigh:  Non-tender and no defects  Knee has swelling 1+ effusion.                        Joint tenderness is present                        Patient is not tender over the medial joint line  Lower Leg:  Has normal appearance and no tenderness or defects  Ankle:  Non-tender and no defects  Foot:  Non-tender and no defects Range of Motion:  Knee:  Range of motion is: 0-115                        Crepitus is  present  Ankle:  Range of motion is normal. Strength and Tone:  The right lower extremity has normal strength and tone. Stability:  Knee:  The knee is stable.  Ankle:  The  ankle is stable.     The patient has been educated about the nature of the problem(s) and counseled on treatment options.  The patient appeared to understand what I have discussed and is in agreement with it.  Encounter Diagnosis  Name Primary?  . Chronic pain of right knee Yes  PLAN Call if any problems.  Precautions discussed.  Continue current medications.   Return to clinic prn   Electronically Signed Darreld McleanWayne Delrose Rohwer, MD 10/24/20173:06 PM

## 2016-07-08 ENCOUNTER — Other Ambulatory Visit (HOSPITAL_COMMUNITY): Payer: Self-pay | Admitting: Adult Health Nurse Practitioner

## 2016-07-08 DIAGNOSIS — Z780 Asymptomatic menopausal state: Secondary | ICD-10-CM

## 2016-07-15 ENCOUNTER — Encounter (INDEPENDENT_AMBULATORY_CARE_PROVIDER_SITE_OTHER): Payer: Self-pay | Admitting: Internal Medicine

## 2016-07-21 ENCOUNTER — Ambulatory Visit (HOSPITAL_COMMUNITY)
Admission: RE | Admit: 2016-07-21 | Discharge: 2016-07-21 | Disposition: A | Discharge status: Home / Self Care | Payer: Medicare Other | Source: Ambulatory Visit | Attending: Adult Health Nurse Practitioner | Admitting: Adult Health Nurse Practitioner

## 2016-07-21 DIAGNOSIS — M85851 Other specified disorders of bone density and structure, right thigh: Secondary | ICD-10-CM | POA: Diagnosis not present

## 2016-07-21 DIAGNOSIS — Z780 Asymptomatic menopausal state: Secondary | ICD-10-CM | POA: Diagnosis present

## 2016-07-23 ENCOUNTER — Encounter (INDEPENDENT_AMBULATORY_CARE_PROVIDER_SITE_OTHER): Payer: Self-pay

## 2016-07-23 ENCOUNTER — Encounter (INDEPENDENT_AMBULATORY_CARE_PROVIDER_SITE_OTHER): Payer: Self-pay | Admitting: Internal Medicine

## 2016-07-23 ENCOUNTER — Other Ambulatory Visit (INDEPENDENT_AMBULATORY_CARE_PROVIDER_SITE_OTHER): Payer: Self-pay | Admitting: Internal Medicine

## 2016-07-23 ENCOUNTER — Encounter (INDEPENDENT_AMBULATORY_CARE_PROVIDER_SITE_OTHER): Payer: Self-pay | Admitting: *Deleted

## 2016-07-23 ENCOUNTER — Ambulatory Visit (INDEPENDENT_AMBULATORY_CARE_PROVIDER_SITE_OTHER): Payer: Medicare Other | Admitting: Internal Medicine

## 2016-07-23 DIAGNOSIS — E1169 Type 2 diabetes mellitus with other specified complication: Secondary | ICD-10-CM | POA: Insufficient documentation

## 2016-07-23 DIAGNOSIS — I10 Essential (primary) hypertension: Secondary | ICD-10-CM | POA: Diagnosis not present

## 2016-07-23 DIAGNOSIS — K219 Gastro-esophageal reflux disease without esophagitis: Secondary | ICD-10-CM

## 2016-07-23 DIAGNOSIS — E038 Other specified hypothyroidism: Secondary | ICD-10-CM | POA: Diagnosis not present

## 2016-07-23 DIAGNOSIS — E039 Hypothyroidism, unspecified: Secondary | ICD-10-CM

## 2016-07-23 DIAGNOSIS — E7800 Pure hypercholesterolemia, unspecified: Secondary | ICD-10-CM | POA: Diagnosis not present

## 2016-07-23 HISTORY — DX: Hypothyroidism, unspecified: E03.9

## 2016-07-23 MED ORDER — PANTOPRAZOLE SODIUM 40 MG PO TBEC: 40.0000 mg | DELAYED_RELEASE_TABLET | Freq: Every day | ORAL | 4 refills | Status: DC

## 2016-07-23 NOTE — Patient Instructions (Signed)
EGD. The risks and benefits such as perforation, bleeding, and infection were reviewed with the patient and is agreeable. 

## 2016-07-23 NOTE — Progress Notes (Signed)
Subjective:    Patient ID: Cynthia PalmsCharlotte J Venneman, female    DOB: 06/03/1941, 75 y.o.   MRN: [id number]  HPI Referred by Dr. Lysbeth GalasNyland for GERD/EGD. She tells me she has epigastric pain. She has had this pain for about 3 months. Seen at [facility] for this. Cardiac work up was normal.  In August she was seen in the ED and underwent a CT which revealed:  IMPRESSION: 1. Enteric contrast in the distal esophagus, may be delayed transit or reflux. There is otherwise no acute abnormality. 2. Abdominal aortic atherosclerosis. Coronary artery calcifications. She says the pain in her epigastric region is better. She does have the pain off an on. She says she has a lot of gas and she has frequent burping. Takes Nexium for her GERD. She says the Nexium is not helping. She is taking Carafate and this is helping.  Appetite is okay. No weight loss. She has a BM daily. No melena or BRRB. She says she had an EGD years ago and she says it was normal. Diabetic x 10-12 yrs. She is eating 2 meals a day.  07/2016 HA1C 6.4      07/07/2016 H and H 12.5 and 36.9 Review of Systems Past Medical History:  Diagnosis Date  . Complication of anesthesia   . Depression   . Diabetes mellitus   . Fibromyalgia   . Hyperlipidemia   . Hypertension   . Hypothyroidism   . Hypothyroidism 07/23/2016  . PONV (postoperative nausea and vomiting)     Past Surgical History:  Procedure Laterality Date  . APPENDECTOMY  2002  . arthroscopy  right 2002, left 2007   bilateral  . BACK SURGERY  10/2007, 09/2008   x2  . CARPAL TUNNEL RELEASE  1986   bilateral  . CESAREAN SECTION  1969, 1967   x2  . KNEE SURGERY     both    Allergies  Allergen Reactions  . Codeine Other (See Comments)    Headache, gi upset  . Penicillins Rash    Has patient had a PCN reaction causing immediate rash, facial/tongue/throat swelling, SOB or lightheadedness with hypotension: Yes Has patient had a PCN reaction causing severe rash involving mucus  membranes or skin necrosis: No Has patient had a PCN reaction that required hospitalization No Has patient had a PCN reaction occurring within the last 10 years: No If all of the above answers are "NO", then may proceed with Cephalosporin use.     Current Outpatient Prescriptions on File Prior to Visit  Medication Sig Dispense Refill  . aspirin EC 81 MG tablet Take 81 mg by mouth daily.      . clobetasol cream (TEMOVATE) 0.05 % Apply 1 application topically 2 (two) times daily. 30 g 11  . esomeprazole (NEXIUM) 20 MG capsule Take 20 mg by mouth daily before breakfast.      . fexofenadine (ALLEGRA) 180 MG tablet Take 180 mg by mouth daily.    . fluticasone (FLONASE) 50 MCG/ACT nasal spray Place 2 sprays into the nose daily as needed for allergies.     . hydrochlorothiazide (HYDRODIURIL) 25 MG tablet Take 25 mg by mouth daily.      Marland Kitchen. HYDROcodone-acetaminophen (NORCO/VICODIN) 5-325 MG tablet Take 1 tablet by mouth every 6 (six) hours as needed for moderate pain.    Marland Kitchen. levothyroxine (SYNTHROID, LEVOTHROID) 100 MCG tablet Take 100 mcg by mouth daily before breakfast.    . lisinopril (PRINIVIL,ZESTRIL) 10 MG tablet Take 10 mg by mouth daily.      .Marland Kitchen  metFORMIN (GLUCOPHAGE) 500 MG tablet Take 500 mg by mouth daily with breakfast.     . Multiple Vitamins-Minerals (MULTIVITAMINS THER. W/MINERALS) TABS Take 1 tablet by mouth daily.      Marland Kitchen. NIFEdipine (PROCARDIA XL/ADALAT-CC) 60 MG 24 hr tablet Take 60 mg by mouth daily.      . pravastatin (PRAVACHOL) 40 MG tablet Take 40 mg by mouth every evening.     . sertraline (ZOLOFT) 50 MG tablet Take 50 mg by mouth daily.      . sucralfate (CARAFATE) 1 GM/10ML suspension Take 10 mLs (1 g total) by mouth 4 (four) times daily -  with meals and at bedtime. 420 mL 0   No current facility-administered medications on file prior to visit.        Objective:   Physical Exam Blood pressure (!) 104/58, pulse (!) 56, temperature 97.5 F (36.4 C), height 4\' 11"  (1.499 m),  weight 204 lb 11.2 oz (92.9 kg). Alert and oriented. Skin warm and dry. Oral mucosa is moist.   . Sclera anicteric, conjunctivae is pink. Thyroid not enlarged. No cervical lymphadenopathy. Lungs clear. Heart regular rate and rhythm.  Abdomen is soft. Bowel sounds are positive. No hepatomegaly. No abdominal masses felt. Slight epigastric tenderness.  No edema to lower extremities.        Assessment & Plan:  GERD. Am going to switch her to Protonix 40mg  Daily. EGD. The risks and benefits such as perforation, bleeding, and infection were reviewed with the patient and is agreeable.

## 2016-08-04 ENCOUNTER — Encounter (INDEPENDENT_AMBULATORY_CARE_PROVIDER_SITE_OTHER): Payer: Self-pay | Admitting: *Deleted

## 2016-09-10 IMAGING — DX DG KNEE COMPLETE 4+V*R*
4 series · 4 of 4 positions shown · non-contrast
Comparison: Right knee MRI  February 07, 2011

CLINICAL DATA: Pain following fall 3 days prior

EXAM:
RIGHT KNEE - COMPLETE 4+ VIEW

[knee ap (1 of 3)]
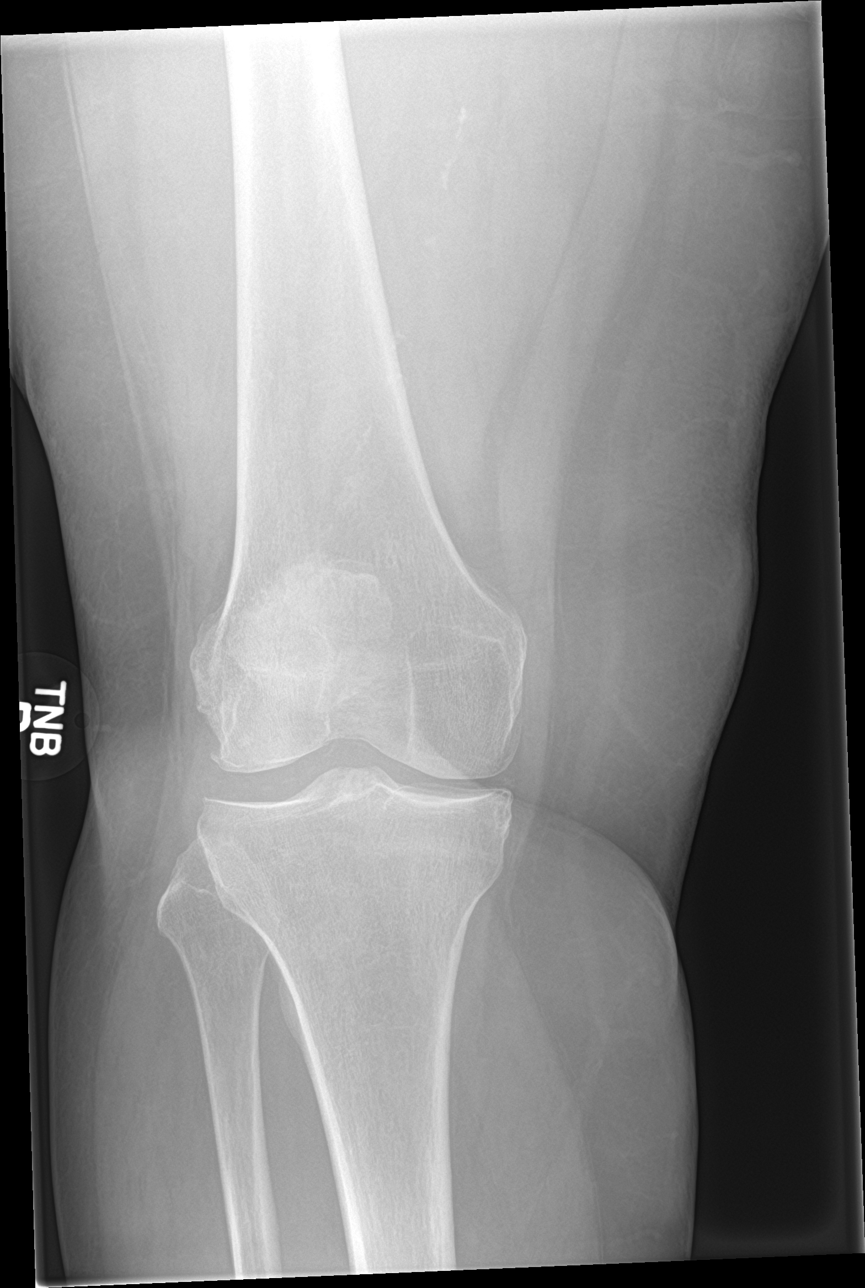

[knee lat]
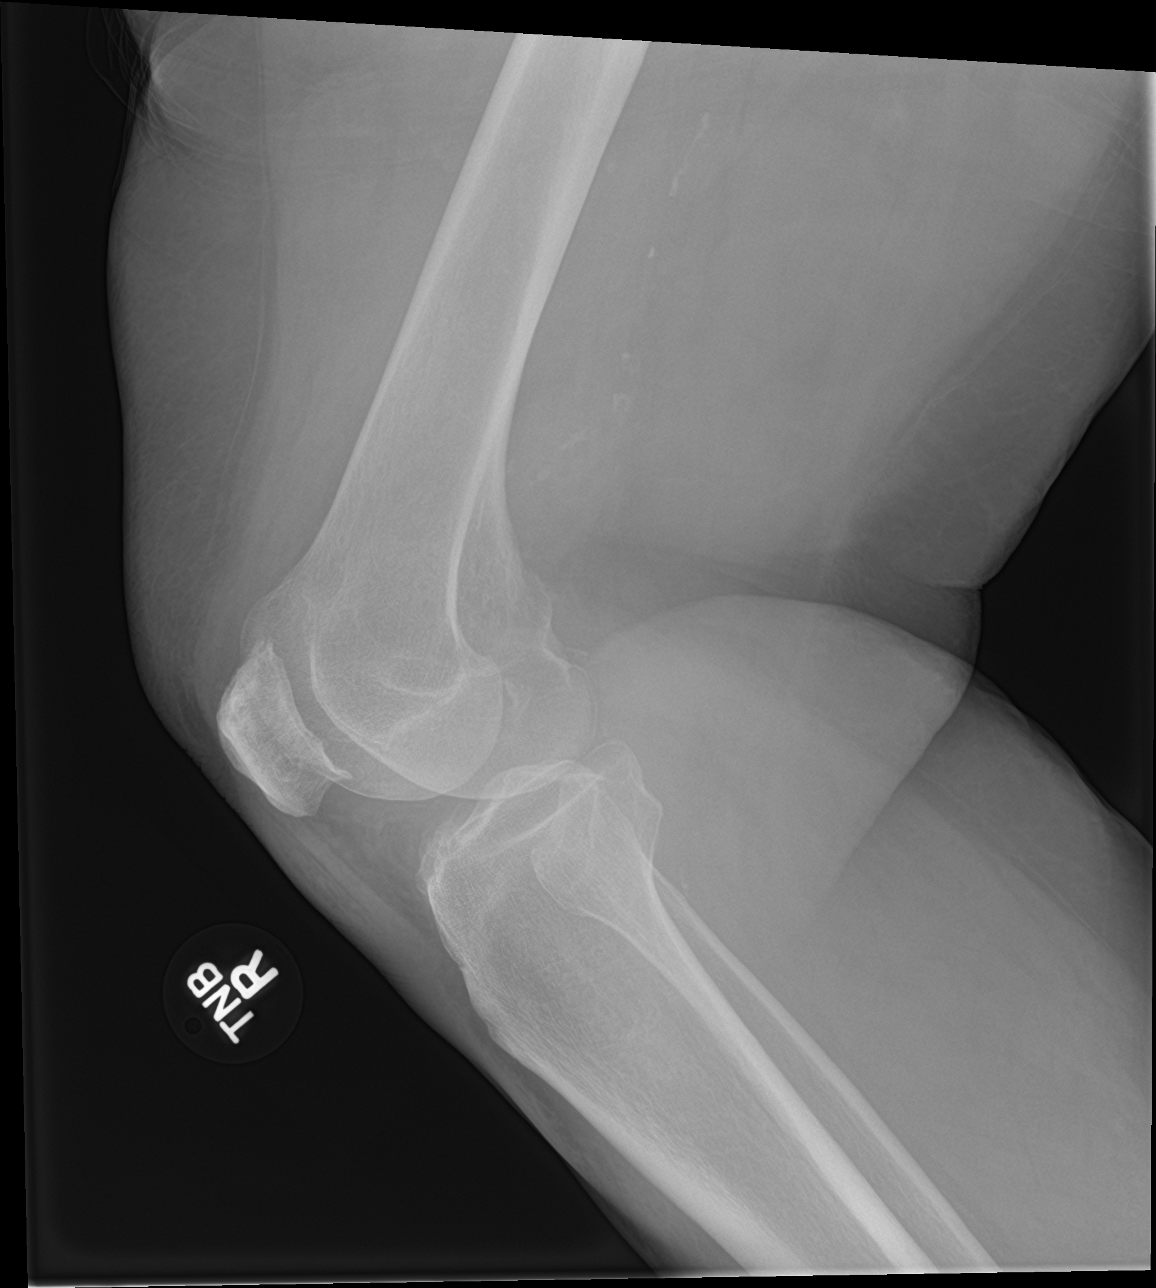

[knee ap (2 of 3)]
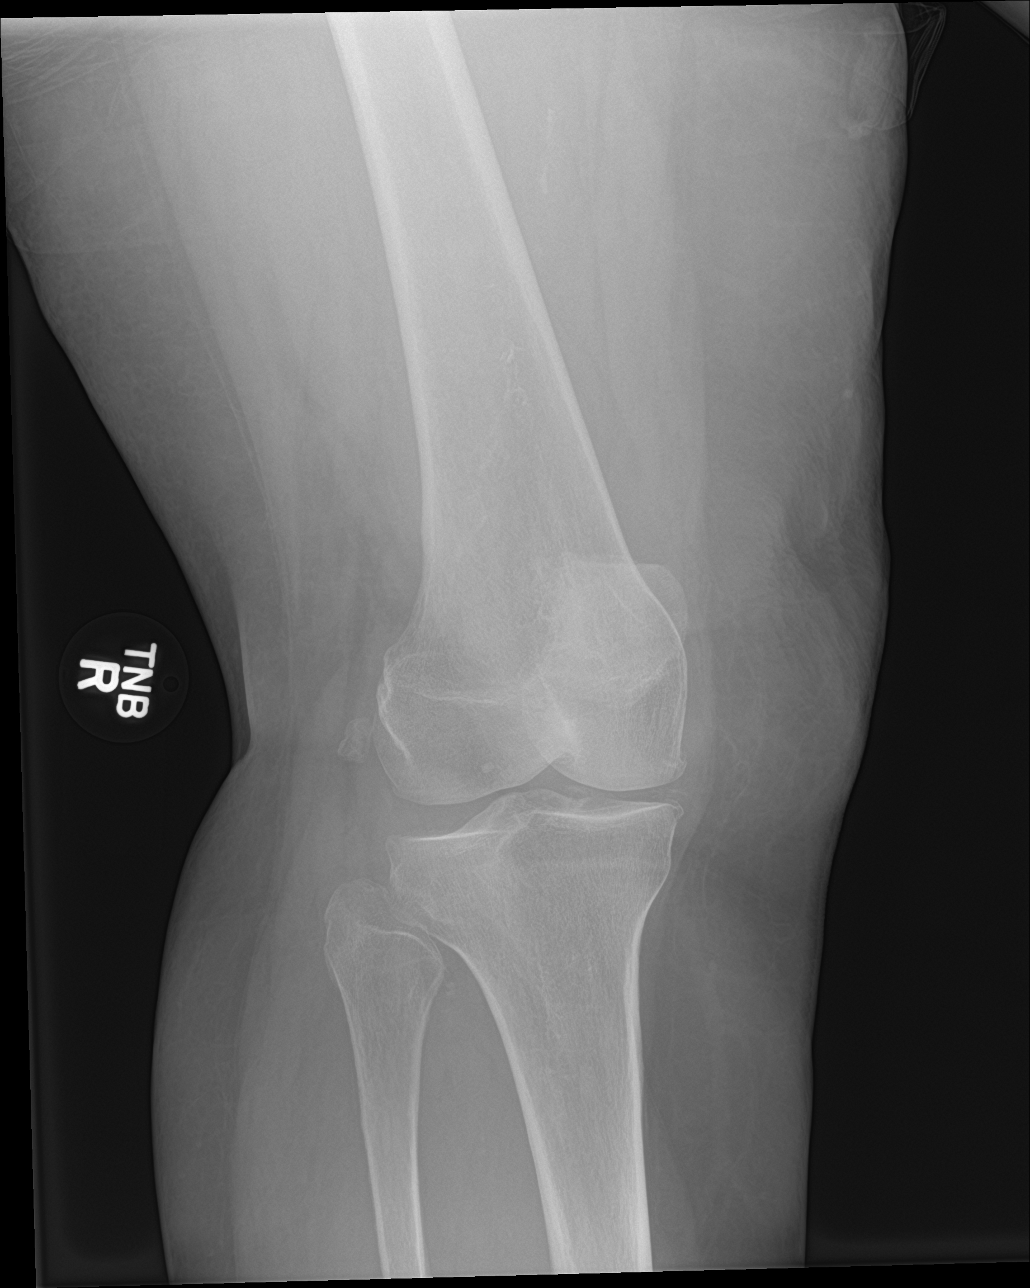

[knee ap (3 of 3)]
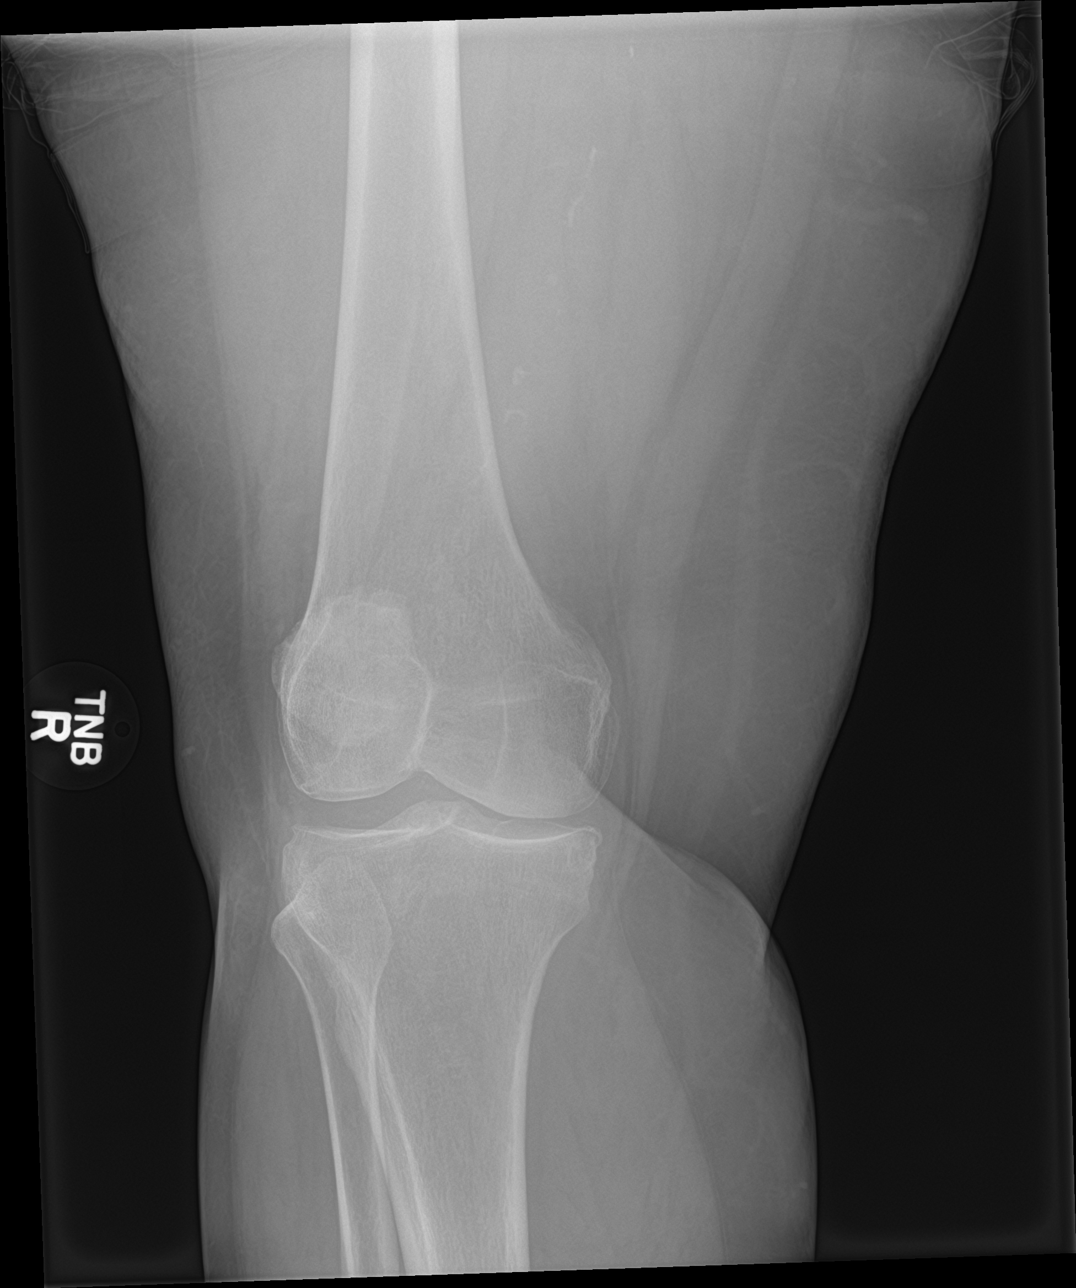

[4 of 4 positions shown; findings below may reference images not displayed]

FINDINGS: Frontal, lateral, and bilateral oblique views were obtained. There
is no demonstrable fracture or dislocation. No joint effusion. There
is moderate narrowing medially and in the patellofemoral joint
region. There is spurring in all compartments, most notably along
the posterior patella. There are scattered foci of meniscal
calcification. There is arterial vascular calcification posterior to
the distal femur.
IMPRESSION: Osteoarthritic change, most notably in the patellofemoral joint and
medial compartment. No fracture or joint effusion. Chondrocalcinosis
is noted. Chondrocalcinosis may be seen with osteoarthritis but also
may be indicative of superimposed calcium pyrophosphate deposition
disease.

## 2016-10-02 ENCOUNTER — Encounter (HOSPITAL_COMMUNITY)
Admission: RE | Disposition: A | Discharge status: Home / Self Care | Payer: Self-pay | Source: Ambulatory Visit | Attending: Internal Medicine

## 2016-10-02 ENCOUNTER — Ambulatory Visit (HOSPITAL_COMMUNITY)
Admission: RE | Admit: 2016-10-02 | Discharge: 2016-10-02 | Disposition: A | Discharge status: Home / Self Care | Payer: Medicare Other | Source: Ambulatory Visit | Attending: Internal Medicine | Admitting: Internal Medicine

## 2016-10-02 ENCOUNTER — Encounter (HOSPITAL_COMMUNITY): Payer: Self-pay | Admitting: *Deleted

## 2016-10-02 DIAGNOSIS — K449 Diaphragmatic hernia without obstruction or gangrene: Secondary | ICD-10-CM | POA: Insufficient documentation

## 2016-10-02 DIAGNOSIS — Z7982 Long term (current) use of aspirin: Secondary | ICD-10-CM | POA: Insufficient documentation

## 2016-10-02 DIAGNOSIS — K2950 Unspecified chronic gastritis without bleeding: Secondary | ICD-10-CM | POA: Insufficient documentation

## 2016-10-02 DIAGNOSIS — I10 Essential (primary) hypertension: Secondary | ICD-10-CM | POA: Insufficient documentation

## 2016-10-02 DIAGNOSIS — E039 Hypothyroidism, unspecified: Secondary | ICD-10-CM | POA: Diagnosis not present

## 2016-10-02 DIAGNOSIS — K2970 Gastritis, unspecified, without bleeding: Secondary | ICD-10-CM | POA: Diagnosis not present

## 2016-10-02 DIAGNOSIS — E785 Hyperlipidemia, unspecified: Secondary | ICD-10-CM | POA: Insufficient documentation

## 2016-10-02 DIAGNOSIS — Z87891 Personal history of nicotine dependence: Secondary | ICD-10-CM | POA: Insufficient documentation

## 2016-10-02 DIAGNOSIS — E119 Type 2 diabetes mellitus without complications: Secondary | ICD-10-CM | POA: Diagnosis not present

## 2016-10-02 DIAGNOSIS — R1013 Epigastric pain: Secondary | ICD-10-CM | POA: Diagnosis not present

## 2016-10-02 DIAGNOSIS — M797 Fibromyalgia: Secondary | ICD-10-CM | POA: Insufficient documentation

## 2016-10-02 DIAGNOSIS — Z8249 Family history of ischemic heart disease and other diseases of the circulatory system: Secondary | ICD-10-CM | POA: Diagnosis not present

## 2016-10-02 DIAGNOSIS — F329 Major depressive disorder, single episode, unspecified: Secondary | ICD-10-CM | POA: Insufficient documentation

## 2016-10-02 DIAGNOSIS — K210 Gastro-esophageal reflux disease with esophagitis: Secondary | ICD-10-CM | POA: Insufficient documentation

## 2016-10-02 DIAGNOSIS — R1110 Vomiting, unspecified: Secondary | ICD-10-CM

## 2016-10-02 DIAGNOSIS — K317 Polyp of stomach and duodenum: Secondary | ICD-10-CM | POA: Insufficient documentation

## 2016-10-02 DIAGNOSIS — Z79899 Other long term (current) drug therapy: Secondary | ICD-10-CM | POA: Diagnosis not present

## 2016-10-02 DIAGNOSIS — Z7984 Long term (current) use of oral hypoglycemic drugs: Secondary | ICD-10-CM | POA: Diagnosis not present

## 2016-10-02 DIAGNOSIS — K219 Gastro-esophageal reflux disease without esophagitis: Secondary | ICD-10-CM | POA: Diagnosis present

## 2016-10-02 DIAGNOSIS — K3189 Other diseases of stomach and duodenum: Secondary | ICD-10-CM | POA: Diagnosis not present

## 2016-10-02 HISTORY — PX: ESOPHAGOGASTRODUODENOSCOPY: SHX5428

## 2016-10-02 HISTORY — PX: BIOPSY: SHX5522

## 2016-10-02 HISTORY — DX: Gastro-esophageal reflux disease without esophagitis: K21.9

## 2016-10-02 LAB — GLUCOSE, CAPILLARY: Glucose-Capillary: 136 mg/dL — ABNORMAL HIGH (ref 65–99)

## 2016-10-02 SURGERY — EGD (ESOPHAGOGASTRODUODENOSCOPY)
Anesthesia: Moderate Sedation

## 2016-10-02 MED ORDER — BUTAMBEN-TETRACAINE-BENZOCAINE 2-2-14 % EX AERO
INHALATION_SPRAY | CUTANEOUS | Status: DC | PRN
  Administered 2016-10-02: 2 via TOPICAL

## 2016-10-02 MED ORDER — SODIUM CHLORIDE 0.9 % IV SOLN
INTRAVENOUS | Status: DC
  Administered 2016-10-02: 14:00:00 via INTRAVENOUS

## 2016-10-02 MED ORDER — STERILE WATER FOR IRRIGATION IR SOLN
Status: DC | PRN
  Administered 2016-10-02: 15:00:00

## 2016-10-02 MED ORDER — MIDAZOLAM HCL 5 MG/5ML IJ SOLN
INTRAMUSCULAR | Status: AC
  Filled 2016-10-02: qty 10

## 2016-10-02 MED ORDER — MEPERIDINE HCL 50 MG/ML IJ SOLN
INTRAMUSCULAR | Status: DC | PRN
  Administered 2016-10-02 (×2): 25 mg via INTRAVENOUS

## 2016-10-02 MED ORDER — MEPERIDINE HCL 50 MG/ML IJ SOLN
INTRAMUSCULAR | Status: AC
  Filled 2016-10-02: qty 1

## 2016-10-02 MED ORDER — MIDAZOLAM HCL 5 MG/5ML IJ SOLN
INTRAMUSCULAR | Status: DC | PRN
  Administered 2016-10-02 (×2): 2 mg via INTRAVENOUS
  Administered 2016-10-02: 1 mg via INTRAVENOUS

## 2016-10-02 MED ORDER — PANTOPRAZOLE SODIUM 40 MG PO TBEC: 40.0000 mg | DELAYED_RELEASE_TABLET | Freq: Two times a day (BID) | ORAL | 3 refills | Status: DC

## 2016-10-02 NOTE — H&P (Addendum)
Cynthia PalmsCharlotte J Mays is an 76 y.o. female.   Chief Complaint: Patient is here for EGD. HPI: Patient is 76 year old Caucasian female with multiple medical problems who has chronic GERD who now presents with postprandial epigastric discomfort and she also feels as if she has not. She also has intermittent heartburn regurgitation and she also has infrequent emesis when she vomits the food that she eats and 7 hours earlier. Last episode was one month ago. She denies weight loss hematemesis melena or rectal bleeding. She gives remote history of peptic ulcer disease for which she was hospitalized. He was seen in emergency room and August 2017 for abdominal pain. CT revealed contrast in distal esophagus felt to be due to poor emptying or reflux from the stomach back into the esophagus.   Past Medical History:  Diagnosis Date  . Complication of anesthesia   . Depression   . Diabetes mellitus   . Fibromyalgia   . GERD (gastroesophageal reflux disease)   . Hyperlipidemia   . Hypertension   . Hypothyroidism   . Hypothyroidism 07/23/2016  . PONV (postoperative nausea and vomiting)     Past Surgical History:  Procedure Laterality Date  . APPENDECTOMY  2002  . arthroscopy  right 2002, left 2007   bilateral  . BACK SURGERY  10/2007, 09/2008   x2  . CARPAL TUNNEL RELEASE  1986   bilateral  . CESAREAN SECTION  1969, 1967   x2  . KNEE SURGERY     both    Family History  Problem Relation Age of Onset  . Anesthesia problems Mother   . CAD Mother 1665  . Cancer Mother     Pancreatic cancer  . Hypertension Father   . Hypotension Neg Hx   . Pseudochol deficiency Neg Hx   . Malignant hyperthermia Neg Hx    Social History:  reports that she has quit smoking. Her smoking use included Cigarettes. She has a 43.00 pack-year smoking history. She has never used smokeless tobacco. She reports that she does not drink alcohol or use drugs.  Allergies:  Allergies  Allergen Reactions  . Codeine Other (See  Comments)    Headache, gi upset  . Penicillins Rash    Has patient had a PCN reaction causing immediate rash, facial/tongue/throat swelling, SOB or lightheadedness with hypotension: Yes Has patient had a PCN reaction causing severe rash involving mucus membranes or skin necrosis: No Has patient had a PCN reaction that required hospitalization No Has patient had a PCN reaction occurring within the last 10 years: No If all of the above answers are "NO", then may proceed with Cephalosporin use.   . Pravastatin     Leg cramps    Medications Prior to Admission  Medication Sig Dispense Refill  . aspirin EC 81 MG tablet Take 81 mg by mouth daily.      . fexofenadine (ALLEGRA) 180 MG tablet Take 180 mg by mouth daily.    . fluticasone (FLONASE) 50 MCG/ACT nasal spray Place 2 sprays into the nose daily as needed for allergies.     . hydrochlorothiazide (HYDRODIURIL) 25 MG tablet Take 25 mg by mouth daily.      Marland Kitchen. HYDROcodone-acetaminophen (NORCO/VICODIN) 5-325 MG tablet Take 1 tablet by mouth every 6 (six) hours as needed for moderate pain.    Marland Kitchen. levothyroxine (SYNTHROID, LEVOTHROID) 100 MCG tablet Take 100 mcg by mouth daily before breakfast.    . lisinopril (PRINIVIL,ZESTRIL) 10 MG tablet Take 10 mg by mouth daily.      .Marland Kitchen  metFORMIN (GLUCOPHAGE) 500 MG tablet Take 500 mg by mouth 2 (two) times daily.     . Multiple Vitamins-Minerals (MULTIVITAMINS THER. W/MINERALS) TABS Take 1 tablet by mouth daily.      . naproxen sodium (ANAPROX) 220 MG tablet Take 440 mg by mouth daily as needed (pain).    Marland Kitchen NIFEdipine (PROCARDIA XL/ADALAT-CC) 60 MG 24 hr tablet Take 60 mg by mouth daily.      . pantoprazole (PROTONIX) 40 MG tablet Take 1 tablet (40 mg total) by mouth daily. 30 tablet 4  . sertraline (ZOLOFT) 50 MG tablet Take 75 mg by mouth daily.     . sucralfate (CARAFATE) 1 GM/10ML suspension Take 10 mLs (1 g total) by mouth 4 (four) times daily -  with meals and at bedtime. 420 mL 0  . clobetasol cream  (TEMOVATE) 0.05 % Apply 1 application topically 2 (two) times daily. (Patient taking differently: Apply 1 application topically daily as needed (rash). ) 30 g 11    Results for orders placed or performed during the hospital encounter of 10/02/16 (from the past 48 hour(s))  Glucose, capillary     Status: Abnormal   Collection Time: 10/02/16  2:01 PM  Result Value Ref Range   Glucose-Capillary 136 (H) 65 - 99 mg/dL   No results found.  ROS  Blood pressure 115/70, pulse (!) 56, temperature 97.6 F (36.4 C), temperature source Oral, resp. rate 14, height 4\' 11"  (1.499 m), weight 207 lb (93.9 kg), SpO2 100 %. Physical Exam  Constitutional: She appears well-developed and well-nourished.  HENT:  Mouth/Throat: Oropharynx is clear and moist.  Eyes: Conjunctivae are normal. No scleral icterus.  Neck: No thyromegaly present.  Cardiovascular: Normal rate, regular rhythm and normal heart sounds.   No murmur heard. Respiratory: Effort normal and breath sounds normal.  GI:  Abdomen is full. It is soft with mild midepigastric tenderness. No organomegaly or masses.  Musculoskeletal: She exhibits no edema.  Lymphadenopathy:    She has no cervical adenopathy.  Neurological: She is alert.  Skin: Skin is warm.     Assessment/Plan Poorly controlled GERD symptoms with postprandial epigastric pain and sporadic vomiting. Diagnostic EGD.  Lionel December, MD 10/02/2016, 2:27 PM

## 2016-10-02 NOTE — Op Note (Signed)
[facility] Patient Name: Cynthia Mays Procedure Date: 10/02/2016 2:21 PM MRN: [id number] Date of Birth: 1941/04/27 Attending MD: Lionel December , MD CSN: [id number] Age: 76 Admit Type: Outpatient Procedure:                Upper GI endoscopy Indications:              Epigastric abdominal pain, Follow-up of                            gastro-esophageal reflux disease,                            Vomiting(sporadic). Providers:                Lionel December, MD, Criselda Peaches. Patsy Lager, RN, Birder Robson, Technician Referring MD:             Joette Catching., MD Medicines:                Cetacaine spray, Meperidine 50 mg IV, Midazolam 5                            mg IV Complications:            No immediate complications. Estimated Blood Loss:     Estimated blood loss was minimal. Procedure:                Pre-Anesthesia Assessment:                           - Prior to the procedure, a History and Physical                            was performed, and patient medications and                            allergies were reviewed. The patient's tolerance of                            previous anesthesia was also reviewed. The risks                            and benefits of the procedure and the sedation                            options and risks were discussed with the patient.                            All questions were answered, and informed consent                            was obtained. Prior Anticoagulants: The patient                            last took aspirin 3  days and naproxen 1 day prior                            to the procedure. ASA Grade Assessment: III - A                            patient with severe systemic disease. After                            reviewing the risks and benefits, the patient was                            deemed in satisfactory condition to undergo the                            procedure.  After obtaining informed consent, the endoscope was                            passed under direct vision. Throughout the                            procedure, the patient's blood pressure, pulse, and                            oxygen saturations were monitored continuously. The                            EG-[id number] ([id number]) scope was introduced through the                            mouth, and advanced to the second part of duodenum.                            The upper GI endoscopy was accomplished without                            difficulty. The patient tolerated the procedure                            well. Scope In: 2:39:23 PM Scope Out: 2:49:22 PM Total Procedure Duration: 0 hours 9 minutes 59 seconds  Findings:      The proximal esophagus and mid esophagus were normal.      LA Grade A (one or more mucosal breaks less than 5 mm, not extending       between tops of 2 mucosal folds) esophagitis was found 32 cm from the       incisors.      The Z-line was irregular and was found 33 cm from the incisors.      A 3 cm hiatal hernia was present.      Multiple 3 to 8 mm pedunculated and sessile polyps were found in the       gastric fundus and in the gastric body. Biopsies were taken with a cold       forceps for histology. The pathology specimen  was placed into Bottle       Number 1.      Mild portal hypertensive gastropathy was found in the gastric fundus.      Patchy mild inflammation characterized by congestion (edema) and       erythema was found in the gastric antrum. Biopsies were taken with a       cold forceps for histology. The pathology specimen was placed into       Bottle Number 2.      The duodenal bulb and second portion of the duodenum were normal. Impression:               - Normal proximal esophagus and mid esophagus.                           - LA Grade A reflux esophagitis.                           - Z-line irregular, 33 cm from the incisors.                            - 3 cm hiatal hernia.                           - Multiple gastric polyps. Biopsied.                           - Portal hypertensive gastropathy.                           - Gastritis. Biopsied.                           - Normal duodenal bulb and second portion of the                            duodenum. Moderate Sedation:      Moderate (conscious) sedation was administered by the endoscopy nurse       and supervised by the endoscopist. The following parameters were       monitored: oxygen saturation, heart rate, blood pressure, CO2       capnography and response to care. Total physician intraservice time was       17 minutes. Recommendation:           - Patient has a contact number available for                            emergencies. The signs and symptoms of potential                            delayed complications were discussed with the                            patient. Return to normal activities tomorrow.                            Written discharge instructions were provided to the  patient.                           - Resume previous diet today.                           - Continue present medications.                           - Resume aspirin at prior dose tomorrow.                           - Await pathology results. Procedure Code(s):        --- Professional ---                           [id number], Esophagogastroduodenoscopy, flexible,                            transoral; with biopsy, single or multiple                           99152, Moderate sedation services provided by the                            same physician or other qualified health care                            professional performing the diagnostic or                            therapeutic service that the sedation supports,                            requiring the presence of an independent trained                            observer to assist in the monitoring of the                             patient's level of consciousness and physiological                            status; initial 15 minutes of intraservice time,                            patient age 37 years or older Diagnosis Code(s):        --- Professional ---                           K21.0, Gastro-esophageal reflux disease with                            esophagitis                           K22.8, Other  specified diseases of esophagus                           K44.9, Diaphragmatic hernia without obstruction or                            gangrene                           K31.7, Polyp of stomach and duodenum                           K76.6, Portal hypertension                           K31.89, Other diseases of stomach and duodenum                           K29.70, Gastritis, unspecified, without bleeding                           R10.13, Epigastric pain                           R11.10, Vomiting, unspecified CPT copyright 2016 [employer]. All rights reserved. The codes documented in this report are preliminary and upon coder review may  be revised to meet current compliance requirements. Lionel December, MD Lionel December, MD 10/02/2016 3:05:35 PM This report has been signed electronically. Number of Addenda: 0

## 2016-10-02 NOTE — Discharge Instructions (Signed)
Increased pantoprazole to 40 mg by mouth 30 minutes before breakfast and evening meal daily. Keep Naprosyn use to minimum. Resume other medications as before. No driving for 24 hours. Physician will call with biopsy results.     Esophagogastroduodenoscopy, Care After Introduction Refer to this sheet in the next few weeks. These instructions provide you with information about caring for yourself after your procedure. Your health care provider may also give you more specific instructions. Your treatment has been planned according to current medical practices, but problems sometimes occur. Call your health care provider if you have any problems or questions after your procedure. What can I expect after the procedure? After the procedure, it is common to have:  A sore throat.  Nausea.  Bloating.  Dizziness.  Fatigue. Follow these instructions at home:  Do not eat or drink anything until the numbing medicine (local anesthetic) has worn off and your gag reflex has returned. You will know that the local anesthetic has worn off when you can swallow comfortably.  Do not drive for 24 hours if you received a medicine to help you relax (sedative).  If your health care provider took a tissue sample for testing during the procedure, make sure to get your test results. This is your responsibility. Ask your health care provider or the department performing the test when your results will be ready.  Keep all follow-up visits as told by your health care provider. This is important. Contact a health care provider if:  You cannot stop coughing.  You are not urinating.  You are urinating less than usual. Get help right away if:  You have trouble swallowing.  You cannot eat or drink.  You have throat or chest pain that gets worse.  You are dizzy or light-headed.  You faint.  You have nausea or vomiting.  You have chills.  You have a fever.  You have severe abdominal pain.  You  have black, tarry, or bloody stools.    Hiatal Hernia A hiatal hernia occurs when part of your stomach slides above the muscle that separates your abdomen from your chest (diaphragm). You can be born with a hiatal hernia (congenital), or it may develop over time. In almost all cases of hiatal hernia, only the top part of the stomach pushes through.  Many people have a hiatal hernia with no symptoms. The larger the hernia, the more likely that you will have symptoms. In some cases, a hiatal hernia allows stomach acid to flow back into the tube that carries food from your mouth to your stomach (esophagus). This may cause heartburn symptoms. Severe heartburn symptoms may mean you have developed a condition called gastroesophageal reflux disease (GERD).  CAUSES  Hiatal hernias are caused by a weakness in the opening (hiatus) where your esophagus passes through your diaphragm to attach to the upper part of your stomach. You may be born with a weakness in your hiatus, or a weakness can develop. RISK FACTORS Older age is a major risk factor for a hiatal hernia. Anything that increases pressure on your diaphragm can also increase your risk of a hiatal hernia. This includes:  Pregnancy.  Excess weight.  Frequent constipation. SIGNS AND SYMPTOMS  People with a hiatal hernia often have no symptoms. If symptoms develop, they are almost always caused by GERD. They may include:  Heartburn.  Belching.  Indigestion.  Trouble swallowing.  Coughing or wheezing.  Sore throat.  Hoarseness.  Chest pain. DIAGNOSIS  A hiatal hernia is sometimes  found during an exam for another problem. Your health care provider may suspect a hiatal hernia if you have symptoms of GERD. Tests may be done to diagnose GERD. These may include:  X-rays of your stomach or chest.  An upper gastrointestinal (GI) series. This is an X-ray exam of your GI tract involving the use of a chalky liquid that you swallow. The liquid  shows up clearly on the X-ray.  Endoscopy. This is a procedure to look into your stomach using a thin, flexible tube that has a tiny camera and light on the end of it. TREATMENT  If you have no symptoms, you may not need treatment. If you have symptoms, treatment may include:  Dietary and lifestyle changes to help reduce GERD symptoms.  Medicines. These may include:  Over-the-counter antacids.  Medicines that make your stomach empty more quickly.  Medicines that block the production of stomach acid (H2 blockers).  Stronger medicines to reduce stomach acid (proton pump inhibitors).  You may need surgery to repair the hernia if other treatments are not helping. HOME CARE INSTRUCTIONS   Take all medicines as directed by your health care provider.  Quit smoking, if you smoke.  Try to achieve and maintain a healthy body weight.  Eat frequent small meals instead of three large meals a day. This keeps your stomach from getting too full.  Eat slowly.  Do not lie down right after eating.  Do noteat 1-2 hours before bed.   Do not drink beverages with caffeine. These include cola, coffee, cocoa, and tea.  Do not drink alcohol.  Avoid foods that can make symptoms of GERD worse. These may include:  Fatty foods.  Citrus fruits.  Other foods and drinks that contain acid.  Avoid putting pressure on your belly. Anything that puts pressure on your belly increases the amount of acid that may be pushed up into your esophagus.   Avoid bending over, especially after eating.  Raise the head of your bed by putting blocks under the legs. This keeps your head and esophagus higher than your stomach.  Do not wear tight clothing around your chest or stomach.  Try not to strain when having a bowel movement, when urinating, or when lifting heavy objects. SEEK MEDICAL CARE IF:  Your symptoms are not controlled with medicines or lifestyle changes.  You are having trouble  swallowing.  You have coughing or wheezing that will not go away. SEEK IMMEDIATE MEDICAL CARE IF:  Your pain is getting worse.  Your pain spreads to your arms, neck, jaw, teeth, or back.  You have shortness of breath.  You sweat for no reason.  You feel sick to your stomach (nauseous) or vomit.  You vomit blood.  You have bright red blood in your stools.  You have black, tarry stools.

## 2016-10-10 ENCOUNTER — Encounter (HOSPITAL_COMMUNITY): Payer: Self-pay | Admitting: Internal Medicine

## 2016-10-28 ENCOUNTER — Encounter (HOSPITAL_COMMUNITY): Payer: Self-pay | Admitting: Anesthesiology

## 2016-10-29 ENCOUNTER — Encounter (HOSPITAL_COMMUNITY): Payer: Self-pay | Admitting: Internal Medicine

## 2016-11-11 ENCOUNTER — Telehealth (INDEPENDENT_AMBULATORY_CARE_PROVIDER_SITE_OTHER): Payer: Self-pay | Admitting: *Deleted

## 2016-11-11 NOTE — Telephone Encounter (Signed)
Per Dr. Karilyn Cotaehman the patient may stop the Pantoprazole as this may be causing her diarrhea. She may take Zantac 150 mg otc  Twice daily. Once in the morning and 1 in the evening.

## 2016-11-11 NOTE — Telephone Encounter (Signed)
Patient called in and had procedure back in February and was given pantoprazole.  She has had diarrhea since done and wonders if medication.  2 hours after taking medication she has this diarrhea liquid stools.  Please call back  Didn't know if something else or medication doing this  [phone]

## 2017-01-06 ENCOUNTER — Ambulatory Visit (INDEPENDENT_AMBULATORY_CARE_PROVIDER_SITE_OTHER): Payer: Medicare Other | Admitting: Orthopaedic Surgery

## 2017-01-06 ENCOUNTER — Encounter: Payer: Self-pay | Admitting: Orthopaedic Surgery

## 2017-01-06 VITALS — BP 128/70 | HR 71 | Wt 199.0 lb

## 2017-01-06 DIAGNOSIS — G8929 Other chronic pain: Secondary | ICD-10-CM

## 2017-01-06 DIAGNOSIS — M25561 Pain in right knee: Secondary | ICD-10-CM

## 2017-01-06 NOTE — Progress Notes (Signed)
CC:  I have pain of my right knee. I would like an injection.  The patient has chronic pain of the right knee.  There is no recent trauma.  There is no redness.  Injections in the past have helped.  The knee has no redness, has an effusion and crepitus present.  ROM of the right knee is 0-100.  Impression:  Chronic knee pain right  Return: 3 weeks  PROCEDURE NOTE:  The patient requests injections of the right knee , verbal consent was obtained.  The right knee was prepped appropriately after time out was performed.   Sterile technique was observed and injection of 1 cc of Depo-Medrol 40 mg with several cc's of plain xylocaine. Anesthesia was provided by ethyl chloride and a 20-gauge needle was used to inject the knee area. The injection was tolerated well.  A band aid dressing was applied.  The patient was advised to apply ice later today and tomorrow to the injection sight as needed.  Electronically Signed Darreld McleanWayne Naylea Wigington, MD 5/8/20182:35 PM

## 2017-01-27 ENCOUNTER — Encounter: Payer: Self-pay | Admitting: Orthopaedic Surgery

## 2017-01-27 ENCOUNTER — Ambulatory Visit (INDEPENDENT_AMBULATORY_CARE_PROVIDER_SITE_OTHER): Payer: Medicare Other | Admitting: Orthopaedic Surgery

## 2017-01-27 VITALS — BP 109/60 | HR 65 | Ht 60.0 in | Wt 202.0 lb

## 2017-01-27 DIAGNOSIS — M25561 Pain in right knee: Secondary | ICD-10-CM

## 2017-01-27 DIAGNOSIS — G8929 Other chronic pain: Secondary | ICD-10-CM

## 2017-01-27 NOTE — Progress Notes (Signed)
Patient Cynthia Mays [name], female DOB:1940-09-20, 76 y.o. JWJ:[id number]  Chief Complaint  Patient presents with  . Knee Pain    Right    HPI  Cynthia Mays is a 76 y.o. female who is having more and more pain with the right knee.  It is swelling and giving way.  She had MRI in August of last year showing: IMPRESSION: 1. Diminutive and irregular posterior horn and midbody medial meniscus with horizontal and separate oblique grade 3 signal in the involved segment. Much of this could represent prior partial meniscectomy and postoperative signal, correlate with patient history. Clearly given the complex grade 3 signal, I cannot completely exclude the possibility of retear of the meniscus. 2. 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 3. Small knee effusion with borderline thickened medial plica. Low-level infiltrative edema in Hoffa's fat pad. 4. Chronic focal thickening in the medial patellar retinaculum, possibly from scarring along a prior arthroscopy port. 5. Mild chronic thickening of the MCL, probably from a remote injury.  She would like to have surgery now.  She had delayed surgery until now as she said she could deal with it.  It is worse and she want something done now. HPI  Body mass index is 39.45 kg/m.  ROS  Review of Systems  Constitutional:       Patient has Diabetes Mellitus. Patient has hypertension. Patient has COPD or shortness of breath. Patient has BMI > 35. Patient does not have current smoking history.  Respiratory: Positive for shortness of breath.   Cardiovascular: Negative for leg swelling.  Endocrine: Positive for cold intolerance.  Musculoskeletal: Positive for arthralgias, gait problem and joint swelling.  Allergic/Immunologic: Positive for environmental allergies.    Past Medical History:  Diagnosis Date  . Complication of anesthesia   . Depression   . Diabetes mellitus   .  Fibromyalgia   . GERD (gastroesophageal reflux disease)   . Hyperlipidemia   . Hypertension   . Hypothyroidism   . Hypothyroidism 07/23/2016  . PONV (postoperative nausea and vomiting)     Past Surgical History:  Procedure Laterality Date  . APPENDECTOMY  2002  . arthroscopy  right 2002, left 2007   bilateral  . BACK SURGERY  10/2007, 09/2008   x2  . BIOPSY  10/02/2016   Procedure: BIOPSY;  Surgeon: Cynthia Hippo, MD;  Location: [facility];  Service: Endoscopy;;  gastric  . CARPAL TUNNEL RELEASE  1986   bilateral  . CESAREAN SECTION  1969, 1967   x2  . ESOPHAGOGASTRODUODENOSCOPY N/A 10/02/2016   Procedure: ESOPHAGOGASTRODUODENOSCOPY (EGD);  Surgeon: Cynthia Hippo, MD;  Location: [facility];  Service: Endoscopy;  Laterality: N/A;  11:15 - moved to 2/1 @ 3:00  . KNEE SURGERY     both    Family History  Problem Relation Age of Onset  . Anesthesia problems Mother   . CAD Mother 52  . Cancer Mother        Pancreatic cancer  . Hypertension Father   . Hypotension Neg Hx   . Pseudochol deficiency Neg Hx   . Malignant hyperthermia Neg Hx     Social History Social History  Substance Use Topics  . Smoking status: Former Smoker    Packs/day: 1.00    Years: 43.00    Types: Cigarettes  . Smokeless tobacco: Never Used     Comment: Quit about 20 years ago.   . Alcohol use No     Comment: occasionally wine  Allergies  Allergen Reactions  . Codeine Other (See Comments)    Headache, gi upset  . Penicillins Rash    Has patient had a PCN reaction causing immediate rash, facial/tongue/throat swelling, SOB or lightheadedness with hypotension: Yes Has patient had a PCN reaction causing severe rash involving mucus membranes or skin necrosis: No Has patient had a PCN reaction that required hospitalization No Has patient had a PCN reaction occurring within the last 10 years: No If all of the above answers are "NO", then may proceed with Cephalosporin use.   . Pravastatin      Leg cramps    Current Outpatient Prescriptions  Medication Sig Dispense Refill  . aspirin EC 81 MG tablet Take 1 tablet (81 mg total) by mouth daily.    . clobetasol cream (TEMOVATE) 0.05 % Apply 1 application topically 2 (two) times daily. (Patient taking differently: Apply 1 application topically daily as needed (rash). ) 30 g 11  . fexofenadine (ALLEGRA) 180 MG tablet Take 180 mg by mouth daily.    . fluticasone (FLONASE) 50 MCG/ACT nasal spray Place 2 sprays into the nose daily as needed for allergies.     . hydrochlorothiazide (HYDRODIURIL) 25 MG tablet Take 25 mg by mouth daily.      Marland Kitchen. HYDROcodone-acetaminophen (NORCO/VICODIN) 5-325 MG tablet Take 1 tablet by mouth every 6 (six) hours as needed for moderate pain.    Marland Kitchen. levothyroxine (SYNTHROID, LEVOTHROID) 100 MCG tablet Take 100 mcg by mouth daily before breakfast.    . lisinopril (PRINIVIL,ZESTRIL) 10 MG tablet Take 10 mg by mouth daily.      . metFORMIN (GLUCOPHAGE) 500 MG tablet Take 500 mg by mouth 2 (two) times daily.     . Multiple Vitamins-Minerals (MULTIVITAMINS THER. W/MINERALS) TABS Take 1 tablet by mouth daily.      . naproxen sodium (ANAPROX) 220 MG tablet Take 440 mg by mouth daily as needed (pain).    Marland Kitchen. NIFEdipine (PROCARDIA XL/ADALAT-CC) 60 MG 24 hr tablet Take 60 mg by mouth daily.      . pantoprazole (PROTONIX) 40 MG tablet Take 1 tablet (40 mg total) by mouth 2 (two) times daily before a meal. 60 tablet 3  . sertraline (ZOLOFT) 50 MG tablet Take 75 mg by mouth daily.     . sucralfate (CARAFATE) 1 GM/10ML suspension Take 10 mLs (1 g total) by mouth 4 (four) times daily -  with meals and at bedtime. 420 mL 0   No current facility-administered medications for this visit.      Physical Exam  Blood pressure 109/60, pulse 65, height 5' (1.524 m), weight 202 lb (91.6 kg).  Constitutional: overall normal hygiene, normal nutrition, well developed, normal grooming, normal body habitus. Assistive  device:none  Musculoskeletal: gait and station Limp right, muscle tone and strength are normal, no tremors or atrophy is present.  .  Neurological: coordination overall normal.  Deep tendon reflex/nerve stretch intact.  Sensation normal.  Cranial nerves II-XII intact.   Skin:   Normal overall no scars, lesions, ulcers or rashes. No psoriasis.  Psychiatric: Alert and oriented x 3.  Recent memory intact, remote memory unclear.  Normal mood and affect. Well groomed.  Good eye contact.  Cardiovascular: overall no swelling, no varicosities, no edema bilaterally, normal temperatures of the legs and arms, no clubbing, cyanosis and good capillary refill.  Lymphatic: palpation is normal.  Her right knee has effusion 1+.  She has medial joint line pain and positive medial McMurray.  Knee is stable.  Limp to the right.  Muscle strength and tone normal.  NV intact.  No distal edema is present.  The patient has been educated about the nature of the problem(s) and counseled on treatment options.  The patient appeared to understand what I have discussed and is in agreement with it.  Encounter Diagnosis  Name Primary?  . Chronic pain of right knee Yes    PLAN Call if any problems.  Precautions discussed.  Continue current medications.   Return to clinic to see Dr. Romeo Apple for possible surgery.   Electronically Signed Darreld Mclean, MD 5/29/20182:41 PM

## 2017-02-04 ENCOUNTER — Encounter: Payer: Self-pay | Admitting: Orthopedic Surgery

## 2017-02-04 ENCOUNTER — Ambulatory Visit (INDEPENDENT_AMBULATORY_CARE_PROVIDER_SITE_OTHER): Payer: Medicare Other | Admitting: Orthopedic Surgery

## 2017-02-04 VITALS — BP 112/60 | HR 63 | Ht 60.0 in | Wt 195.0 lb

## 2017-02-04 DIAGNOSIS — G8929 Other chronic pain: Secondary | ICD-10-CM | POA: Diagnosis not present

## 2017-02-04 DIAGNOSIS — G5701 Lesion of sciatic nerve, right lower limb: Secondary | ICD-10-CM

## 2017-02-04 DIAGNOSIS — M25561 Pain in right knee: Secondary | ICD-10-CM | POA: Diagnosis not present

## 2017-02-04 NOTE — Progress Notes (Signed)
NEW PATIENT OFFICE VISIT    Chief Complaint  Patient presents with  . Knee Pain    Right knee pain, referred by Dr. Hilda Lias.    76 year old female referred to me for possible surgery in her right knee. After a period of time of treatment for pain in her right knee the patient was sent for MRI and it showed a "I cannot completely exclude the possibility of re-tear of the medial meniscus" severe patellofemoral arthritis medial compartment arthritis knee effusion chronic thickening of medial collateral ligament with diminutive and irregular posterior horn and body of the medial meniscus  Interestingly all of her pain is lateral and she says that her pain runs down her right leg  She has had 2 previous lumbar surgeries including lumbar fusion.    Review of Systems  Musculoskeletal: Positive for back pain.  Neurological: Positive for tingling.     Past Medical History:  Diagnosis Date  . Complication of anesthesia   . Depression   . Diabetes mellitus   . Fibromyalgia   . GERD (gastroesophageal reflux disease)   . Hyperlipidemia   . Hypertension   . Hypothyroidism   . Hypothyroidism 07/23/2016  . PONV (postoperative nausea and vomiting)     Past Surgical History:  Procedure Laterality Date  . APPENDECTOMY  2002  . arthroscopy  right 2002, left 2007   bilateral  . BACK SURGERY  10/2007, 09/2008   x2  . BIOPSY  10/02/2016   Procedure: BIOPSY;  Surgeon: Malissa Hippo, MD;  Location: [facility];  Service: Endoscopy;;  gastric  . CARPAL TUNNEL RELEASE  1986   bilateral  . CESAREAN SECTION  1969, 1967   x2  . ESOPHAGOGASTRODUODENOSCOPY N/A 10/02/2016   Procedure: ESOPHAGOGASTRODUODENOSCOPY (EGD);  Surgeon: Malissa Hippo, MD;  Location: [facility];  Service: Endoscopy;  Laterality: N/A;  11:15 - moved to 2/1 @ 3:00  . KNEE SURGERY     both    Family History  Problem Relation Age of Onset  . Anesthesia problems Mother   . CAD Mother 33  . Cancer Mother    Pancreatic cancer  . Hypertension Father   . Hypotension Neg Hx   . Pseudochol deficiency Neg Hx   . Malignant hyperthermia Neg Hx    Social History  Substance Use Topics  . Smoking status: Former Smoker    Packs/day: 1.00    Years: 43.00    Types: Cigarettes  . Smokeless tobacco: Never Used     Comment: Quit about 20 years ago.   . Alcohol use No     Comment: occasionally wine    BP 112/60   Pulse 63   Ht 5' (1.524 m)   Wt 195 lb (88.5 kg)   BMI 38.08 kg/m   Physical Exam  Constitutional: She is oriented to person, place, and time. She appears well-developed and well-nourished.  Neurological: She is alert and oriented to person, place, and time.  Psychiatric: She has a normal mood and affect.  Vitals reviewed.   Ortho Exam   I notice that she has a fairly normal gait she is not limping. When we evaluate her right lower extremity she has tenderness in the lateral portion of her lower leg and lateral portion of her thigh up into her back with back tenderness. She has no medial joint line tenderness. She has maintained a functional range of motion her ligaments are stable muscle tone is good her skin is intact she has good distal  pulse are extremity is warm to touch she has altered sensation in the right leg  Left knee range of motion stability strength normal skin is normal. No peripheral edema is noted on the left    Encounter Diagnoses  Name Primary?  . Chronic pain of right knee Yes  . Irritation of right sciatic nerve      PLAN:   The patient will see her neurosurgeon as her pain in her leg is actually lateral and radiates up into the back and she has no medial pain despite her MRI findings of possible re-tear for previously resected meniscus

## 2017-02-17 ENCOUNTER — Other Ambulatory Visit (INDEPENDENT_AMBULATORY_CARE_PROVIDER_SITE_OTHER): Payer: Self-pay | Admitting: Internal Medicine

## 2017-09-03 ENCOUNTER — Other Ambulatory Visit (INDEPENDENT_AMBULATORY_CARE_PROVIDER_SITE_OTHER): Payer: Self-pay | Admitting: Internal Medicine

## 2017-10-29 DIAGNOSIS — K449 Diaphragmatic hernia without obstruction or gangrene: Secondary | ICD-10-CM | POA: Insufficient documentation

## 2017-11-30 ENCOUNTER — Ambulatory Visit (HOSPITAL_COMMUNITY)
Admission: RE | Admit: 2017-11-30 | Discharge: 2017-11-30 | Disposition: A | Discharge status: Home / Self Care | Payer: Medicare Other | Source: Ambulatory Visit | Attending: Physician Assistant | Admitting: Physician Assistant

## 2017-11-30 ENCOUNTER — Other Ambulatory Visit (HOSPITAL_COMMUNITY): Payer: Self-pay | Admitting: Physician Assistant

## 2017-11-30 DIAGNOSIS — M25532 Pain in left wrist: Secondary | ICD-10-CM | POA: Insufficient documentation

## 2017-11-30 DIAGNOSIS — M25432 Effusion, left wrist: Secondary | ICD-10-CM | POA: Insufficient documentation

## 2017-11-30 DIAGNOSIS — M19042 Primary osteoarthritis, left hand: Secondary | ICD-10-CM | POA: Diagnosis not present

## 2017-11-30 DIAGNOSIS — R937 Abnormal findings on diagnostic imaging of other parts of musculoskeletal system: Secondary | ICD-10-CM | POA: Diagnosis not present

## 2017-12-18 ENCOUNTER — Encounter (HOSPITAL_COMMUNITY): Payer: Self-pay

## 2017-12-18 ENCOUNTER — Emergency Department (HOSPITAL_COMMUNITY)
Admission: EM | Admit: 2017-12-18 | Discharge: 2017-12-18 | Disposition: A | Discharge status: Home / Self Care | Payer: Medicare Other | Attending: Emergency Medicine | Admitting: Emergency Medicine

## 2017-12-18 ENCOUNTER — Emergency Department (HOSPITAL_COMMUNITY): Payer: Medicare Other

## 2017-12-18 DIAGNOSIS — I10 Essential (primary) hypertension: Secondary | ICD-10-CM | POA: Insufficient documentation

## 2017-12-18 DIAGNOSIS — Z7984 Long term (current) use of oral hypoglycemic drugs: Secondary | ICD-10-CM | POA: Diagnosis not present

## 2017-12-18 DIAGNOSIS — F329 Major depressive disorder, single episode, unspecified: Secondary | ICD-10-CM | POA: Insufficient documentation

## 2017-12-18 DIAGNOSIS — Z87891 Personal history of nicotine dependence: Secondary | ICD-10-CM | POA: Insufficient documentation

## 2017-12-18 DIAGNOSIS — E039 Hypothyroidism, unspecified: Secondary | ICD-10-CM | POA: Diagnosis not present

## 2017-12-18 DIAGNOSIS — E119 Type 2 diabetes mellitus without complications: Secondary | ICD-10-CM | POA: Diagnosis not present

## 2017-12-18 DIAGNOSIS — M25561 Pain in right knee: Secondary | ICD-10-CM

## 2017-12-18 DIAGNOSIS — Z79899 Other long term (current) drug therapy: Secondary | ICD-10-CM | POA: Diagnosis not present

## 2017-12-18 NOTE — ED Provider Notes (Signed)
Emergency Department Provider Note   I have reviewed the triage vital signs and the nursing notes.   HISTORY  Chief Complaint Knee Pain   HPI Cynthia Mays is a 77 y.o. female with multiple medical problems as documented below the presents to the emergency department today with right knee pain.  Patient states that she can walk on it and bear weight however seems to hurt when she bends it in the posterior area.  She describes a fall earlier this morning which is hard to understand but the daughter thinks that she twisted her knee after a mechanical fall based on the patient's description of her fall.  Pain is worse with bending and palpation of the back of her knee.  No injuries elsewhere.  The fall was because she slipped on something wet on the floor did not hit her head no concern for syncope. No other associated or modifying symptoms.    Past Medical History:  Diagnosis Date  . Complication of anesthesia   . Depression   . Diabetes mellitus   . Fibromyalgia   . GERD (gastroesophageal reflux disease)   . Hyperlipidemia   . Hypertension   . Hypothyroidism   . Hypothyroidism 07/23/2016  . PONV (postoperative nausea and vomiting)     Patient Active Problem List   Diagnosis Date Noted  . Essential hypertension 07/23/2016  . High cholesterol 07/23/2016  . Hypothyroidism 07/23/2016  . GERD (gastroesophageal reflux disease) 07/23/2016  . Gastroesophageal reflux disease without esophagitis 07/23/2016  . Left knee pain 10/24/2015  . Precordial pain 01/15/2013    Past Surgical History:  Procedure Laterality Date  . APPENDECTOMY  2002  . arthroscopy  right 2002, left 2007   bilateral  . BACK SURGERY  10/2007, 09/2008   x2  . BIOPSY  10/02/2016   Procedure: BIOPSY;  Surgeon: Malissa HippoNajeeb U Rehman, MD;  Location: [facility];  Service: Endoscopy;;  gastric  . CARPAL TUNNEL RELEASE  1986   bilateral  . CESAREAN SECTION  1969, 1967   x2  . ESOPHAGOGASTRODUODENOSCOPY N/A  10/02/2016   Procedure: ESOPHAGOGASTRODUODENOSCOPY (EGD);  Surgeon: Malissa HippoNajeeb U Rehman, MD;  Location: [facility];  Service: Endoscopy;  Laterality: N/A;  11:15 - moved to 2/1 @ 3:00  . KNEE SURGERY     both    Current Outpatient Rx  . Order #: [id number] Class: Normal  . Order #: [id number] Class: Historical Med  . Order #: [id number] Class: Historical Med  . Order #: [id number] Class: Historical Med  . Order #: [id number] Class: Historical Med  . Order #: [id number] Class: Historical Med  . Order #: [id number] Class: Historical Med  . Order #: [id number] Class: Historical Med  . Order #: [id number] Class: Historical Med  . Order #: [id number] Class: Historical Med  . Order #: [id number] Class: Historical Med  . Order #: [id number] Class: Normal  . Order #: [id number] Class: Historical Med  . Order #: [id number] Class: Print    Allergies Codeine; Penicillins; and Pravastatin  Family History  Problem Relation Age of Onset  . Anesthesia problems Mother   . CAD Mother 6265  . Cancer Mother        Pancreatic cancer  . Hypertension Father   . Hypotension Neg Hx   . Pseudochol deficiency Neg Hx   . Malignant hyperthermia Neg Hx     Social History Social History   Tobacco Use  . Smoking status: Former Smoker    Packs/day: 1.00    Years: 43.00    Pack years: 43.00    Types:  Cigarettes  . Smokeless tobacco: Never Used  . Tobacco comment: Quit about 20 years ago.   Substance Use Topics  . Alcohol use: No    Comment: occasionally wine  . Drug use: No    Review of Systems  All other systems negative except as documented in the HPI. All pertinent positives and negatives as reviewed in the HPI. ____________________________________________   PHYSICAL EXAM:  VITAL SIGNS: ED Triage Vitals  Enc Vitals Group     BP 12/18/17 1335 112/62     Pulse Rate 12/18/17 1335 73     Resp 12/18/17 1335 17     Temp 12/18/17 1335 97.9 F (36.6 C)     Temp Source 12/18/17 1335 Oral     SpO2 12/18/17 1335 97 %      Weight 12/18/17 1336 193 lb 2 oz (87.6 kg)     Height 12/18/17 1336 5' (1.524 m)     Head Circumference --      Peak Flow --      Pain Score 12/18/17 1336 10     Pain Loc --      Pain Edu? --      Excl. in GC? --     Constitutional: Alert and oriented. Well appearing and in no acute distress. Eyes: Conjunctivae are normal. PERRL. EOMI. Head: Atraumatic. Nose: No congestion/rhinnorhea. Mouth/Throat: Mucous membranes are moist.  Oropharynx non-erythematous. Neck: No stridor.  No meningeal signs.   Cardiovascular: Normal rate, regular rhythm. Good peripheral circulation. Grossly normal heart sounds.   Respiratory: Normal respiratory effort.  No retractions. Lungs CTAB. Gastrointestinal: Soft and nontender. No distention.  Musculoskeletal: severe pain of right posterior knee with palpation and ROM. Stable. Distal pulse normal. Distal sensation intact. mild edema noted.  Neurologic:  Normal speech and language. No gross focal neurologic deficits are appreciated.  Skin:  Skin is warm, dry and intact. No rash noted.  ____________________________________________  RADIOLOGY  Dg Tibia/fibula Right  Result Date: 12/18/2017 CLINICAL DATA:  Pain secondary to a fall this morning. EXAM: RIGHT TIBIA AND FIBULA - 2 VIEW COMPARISON:  Radiographs dated 01/18/2016 FINDINGS: No fracture or dislocation. Chondrocalcinosis. Slight chronic narrowing of the medial compartment of the right knee. Minimal degenerative changes of the ankle joint. IMPRESSION: No acute abnormality. Arthritic changes at the knee and ankle as described. Electronically Signed   By: Francene Boyers M.D.   On: 12/18/2017 15:45   Dg Femur Min 2 Views Right  Result Date: 12/18/2017 CLINICAL DATA:  Right leg pain since a fall this morning. The patient slipped in cat vomit. EXAM: RIGHT FEMUR 2 VIEWS COMPARISON:  None. FINDINGS: There is no fracture or dislocation. Osteoarthritic changes in the patellofemoral compartment of the right knee.  Calcific tendinopathy of the gluteal tendon insertions on the right greater trochanter. Vascular calcifications in the thigh. IMPRESSION: No acute abnormality. Slight degenerative changes of the right hip and right knee as described. Electronically Signed   By: Francene Boyers M.D.   On: 12/18/2017 15:44    ____________________________________________   PROCEDURES  Procedure(s) performed:   Procedures   ____________________________________________   INITIAL IMPRESSION / ASSESSMENT AND PLAN / ED COURSE  Suspect meniscal/ligamentous tear/sprain however will xr to ensure no fractures. If normal, will put in knee immobilizer with ortho follow up for possible MRI.   Pertinent labs & imaging results that were available during my care of the patient were reviewed by me and considered in my medical decision making (see chart for details).  ____________________________________________  FINAL CLINICAL IMPRESSION(S) / ED DIAGNOSES  Final diagnoses:  Acute pain of right knee     MEDICATIONS GIVEN DURING THIS VISIT:  Medications - No data to display   NEW OUTPATIENT MEDICATIONS STARTED DURING THIS VISIT:  Discharge Medication List as of 12/18/2017  5:18 PM      Note:  This note was prepared with assistance of Dragon voice recognition software. Occasional wrong-word or sound-a-like substitutions may have occurred due to the inherent limitations of voice recognition software.   Marily Memos, MD 12/18/17 2300

## 2017-12-18 NOTE — ED Triage Notes (Signed)
Pt reports that she slipped in cat vomit this morning and fell on her right knee. Pt reports too painful to bend.

## 2017-12-28 ENCOUNTER — Ambulatory Visit (INDEPENDENT_AMBULATORY_CARE_PROVIDER_SITE_OTHER): Payer: Medicare Other | Admitting: Orthopedic Surgery

## 2017-12-28 VITALS — BP 140/94 | HR 60 | Ht 60.0 in | Wt 195.0 lb

## 2017-12-28 DIAGNOSIS — S8001XA Contusion of right knee, initial encounter: Secondary | ICD-10-CM

## 2017-12-28 NOTE — Progress Notes (Addendum)
NEW PROBLEM OFFICE VISIT   Chief Complaint  Patient presents with  . Leg Injury    ER follow up on right leg pain, Fall on 12-18-17.    MEDICAL DECISION SECTION  Report of x-ray EXAM: RIGHT TIBIA AND FIBULA - 2 VIEW   COMPARISON:  Radiographs dated 01/18/2016   FINDINGS: No fracture or dislocation. Chondrocalcinosis. Slight chronic narrowing of the medial compartment of the right knee. Minimal degenerative changes of the ankle joint.   IMPRESSION: No acute abnormality. Arthritic changes at the knee and ankle as described.     Electronically Signed   By: Francene Boyers M.D.  My independent reading of xrays: 4 views of knee and tibia all-inclusive arthritic changes of the knee are mild.  No acute fracture.   Second x-ray set of for right femur and hip included  No fracture dislocation or acute process seen.   Encounter Diagnosis  Name Primary?  . Contusion of right knee, initial encounter Yes     PLAN:  Recommend home exercise program with daily quad sets quad strengthening range of motion exercise.  Follow-up 3 to 4 weeks  Chief Complaint  Patient presents with  . Leg Injury    ER follow up on right leg pain, Fall on 12-18-17.     77 year old female who presents with acute pain in the right knee after falling about 10 days ago.  She complains of anterior knee pain right knee for the last 3 weeks with decreased range of motion decreased flexion with dull anterior knee pain   Review of Systems  Constitutional: Negative for chills, fever and weight loss.  Respiratory: Negative for shortness of breath.   Cardiovascular: Negative for chest pain.  Neurological: Negative for tingling.     Past Medical History:  Diagnosis Date  . Complication of anesthesia   . Depression   . Diabetes mellitus   . Fibromyalgia   . GERD (gastroesophageal reflux disease)   . Hyperlipidemia   . Hypertension   . Hypothyroidism   . Hypothyroidism 07/23/2016  . PONV  (postoperative nausea and vomiting)     Past Surgical History:  Procedure Laterality Date  . APPENDECTOMY  2002  . arthroscopy  right 2002, left 2007   bilateral  . BACK SURGERY  10/2007, 09/2008   x2  . BIOPSY  10/02/2016   Procedure: BIOPSY;  Surgeon: Malissa Hippo, MD;  Location: [facility];  Service: Endoscopy;;  gastric  . CARPAL TUNNEL RELEASE  1986   bilateral  . CESAREAN SECTION  1969, 1967   x2  . ESOPHAGOGASTRODUODENOSCOPY N/A 10/02/2016   Procedure: ESOPHAGOGASTRODUODENOSCOPY (EGD);  Surgeon: Malissa Hippo, MD;  Location: [facility];  Service: Endoscopy;  Laterality: N/A;  11:15 - moved to 2/1 @ 3:00  . KNEE SURGERY     both    Family History  Problem Relation Age of Onset  . Anesthesia problems Mother   . CAD Mother 19  . Cancer Mother        Pancreatic cancer  . Hypertension Father   . Hypotension Neg Hx   . Pseudochol deficiency Neg Hx   . Malignant hyperthermia Neg Hx    Social History   Tobacco Use  . Smoking status: Former Smoker    Packs/day: 1.00    Years: 43.00    Pack years: 43.00    Types: Cigarettes  . Smokeless tobacco: Never Used  . Tobacco comment: Quit about 20 years ago.   Substance Use Topics  .  Alcohol use: No    Comment: occasionally wine  . Drug use: No    @  No outpatient medications have been marked as taking for the 12/28/17 encounter (Office Visit) with Vickki Hearing, MD.    BP (!) 140/94   Pulse 60   Ht 5' (1.524 m)   Wt 195 lb (88.5 kg)   BMI 38.08 kg/m   Physical Exam  Constitutional: She is oriented to person, place, and time. She appears well-developed and well-nourished.  Neurological: She is alert and oriented to person, place, and time.  Psychiatric: She has a normal mood and affect. Judgment normal.  Vitals reviewed.   Right Knee Exam   Muscle Strength  The patient has normal right knee strength.  Tenderness  The patient is experiencing tenderness in the patellar tendon and  patella.  Range of Motion  Extension: normal  Flexion:  100 normal   Tests  McMurray:  Medial - negative Lateral - negative Varus: negative Valgus: negative Drawer:  Anterior - negative    Posterior - negative  Other  Erythema: absent Scars: absent Sensation: normal Pulse: present Swelling: none   Left Knee Exam   Muscle Strength  The patient has normal left knee strength.  Tenderness  The patient is experiencing no tenderness.   Range of Motion  Extension: normal  Flexion: normal   Tests  McMurray:  Medial - negative Lateral - negative Varus: negative Valgus: negative Drawer:  Anterior - negative     Posterior - negative  Other  Erythema: absent Scars: absent Sensation: normal Pulse: present Swelling: none

## 2018-01-18 ENCOUNTER — Ambulatory Visit (INDEPENDENT_AMBULATORY_CARE_PROVIDER_SITE_OTHER): Payer: Medicare Other | Admitting: Orthopedic Surgery

## 2018-01-18 ENCOUNTER — Encounter: Payer: Self-pay | Admitting: Orthopedic Surgery

## 2018-01-18 VITALS — BP 123/82 | HR 76 | Ht 60.0 in | Wt 195.0 lb

## 2018-01-18 DIAGNOSIS — S8001XD Contusion of right knee, subsequent encounter: Secondary | ICD-10-CM

## 2018-01-18 DIAGNOSIS — R600 Localized edema: Secondary | ICD-10-CM | POA: Diagnosis not present

## 2018-01-18 NOTE — Progress Notes (Signed)
Progress Note   Patient ID: Cynthia Mays, female   DOB: 02-Feb-1941, 77 y.o.   MRN: [id number]  Chief Complaint  Patient presents with  . Knee Pain    right      Medical decision-making Encounter Diagnoses  Name Primary?  . Contusion of right knee, subsequent encounter   . Edema of leg Yes     PLAN:  We are making significant improvement we did have a new injury but she is walking well she is moving her knee fine she just has some soreness right in the anterolateral joint line which we will treat with symptomatic measures such as ice heat and topical medications.  We did add a teds stocking to control her right leg edema which is newly diagnosed   No orders of the defined types were placed in this encounter.        Chief Complaint  Patient presents with  . Knee Pain    right     77 year old comes in for evaluation and recheck regarding over to the contusion of the right leg on April 19 since that time her right lateral knee contusion has improved but she reinjured her knee stepping down off of a pickup truck I believe and then bumped her knee against something near the truck and started having lateral pain that was about 3 days ago prior contusion has resolved although she still complaining of some new onset right leg edema since that contusion    Review of Systems  Respiratory: Negative for shortness of breath.   Cardiovascular: Negative for chest pain and palpitations.   No outpatient medications have been marked as taking for the 01/18/18 encounter (Office Visit) with Vickki Hearing, MD.    Allergies  Allergen Reactions  . Codeine Other (See Comments)    Headache, gi upset  . Penicillins Rash    Has patient had a PCN reaction causing immediate rash, facial/tongue/throat swelling, SOB or lightheadedness with hypotension: Yes Has patient had a PCN reaction causing severe rash involving mucus membranes or skin necrosis: No Has patient had a PCN reaction  that required hospitalization No Has patient had a PCN reaction occurring within the last 10 years: No If all of the above answers are "NO", then may proceed with Cephalosporin use.   . Pravastatin     Leg cramps     BP 123/82   Pulse 76   Ht 5' (1.524 m)   Wt 195 lb (88.5 kg)   BMI 38.08 kg/m   Physical Exam  Constitutional: She is oriented to person, place, and time. She appears well-developed and well-nourished.  Musculoskeletal:       Legs: Neurological: She is alert and oriented to person, place, and time.  Psychiatric: She has a normal mood and affect. Judgment normal.  Vitals reviewed.      Fuller Canada, MD 01/18/2018 12:25 PM

## 2018-01-26 DIAGNOSIS — Z79899 Other long term (current) drug therapy: Secondary | ICD-10-CM | POA: Insufficient documentation

## 2018-02-17 ENCOUNTER — Encounter: Payer: Self-pay | Admitting: Orthopedic Surgery

## 2018-02-17 ENCOUNTER — Ambulatory Visit (INDEPENDENT_AMBULATORY_CARE_PROVIDER_SITE_OTHER): Payer: Medicare Other | Admitting: Orthopedic Surgery

## 2018-02-17 VITALS — BP 113/70 | HR 80 | Ht 60.0 in | Wt 195.0 lb

## 2018-02-17 DIAGNOSIS — M7631 Iliotibial band syndrome, right leg: Secondary | ICD-10-CM

## 2018-02-17 NOTE — Progress Notes (Signed)
Progress Note   Patient ID: Cynthia Mays, female   DOB: 05/30/1941, 77 y.o.   MRN: [id number]   Chief Complaint  Patient presents with  . Knee Pain    right / wants injection     77 year old female fell injured her right knee sustained a contusion.  Initially got better but now comes in with lateral knee pain along the iliotibial band no history of recent trauma other than what was described.   Review of Systems  Musculoskeletal: Positive for joint pain.  Neurological: Negative for tingling.    Allergies  Allergen Reactions  . Codeine Other (See Comments)    Headache, gi upset  . Penicillins Rash    Has patient had a PCN reaction causing immediate rash, facial/tongue/throat swelling, SOB or lightheadedness with hypotension: Yes Has patient had a PCN reaction causing severe rash involving mucus membranes or skin necrosis: No Has patient had a PCN reaction that required hospitalization No Has patient had a PCN reaction occurring within the last 10 years: No If all of the above answers are "NO", then may proceed with Cephalosporin use.   . Pravastatin     Leg cramps     BP 113/70   Pulse 80   Ht 5' (1.524 m)   Wt 195 lb (88.5 kg)   BMI 38.08 kg/m   Physical Exam  Constitutional: She is oriented to person, place, and time. She appears well-developed and well-nourished. No distress.  Musculoskeletal:       Legs: Neurological: She is alert and oriented to person, place, and time. She displays normal reflexes. No sensory deficit. She exhibits normal muscle tone. Coordination normal.  Skin: Skin is warm and dry. Capillary refill takes less than 2 seconds. No rash noted. She is not diaphoretic. No erythema. No pallor.  Psychiatric: She has a normal mood and affect. Her behavior is normal. Judgment and thought content normal.     Medical decisions:   Data  Imaging:   none  Encounter Diagnosis  Name Primary?  . Iliotibial band syndrome of right side Yes     PLAN:   Verbal consent site confirmation right iliotibial band  A steroid injection was performed at right iliotibial band using 1% plain Lidocaine and 40 mg of.Depo-Medrol This was well tolerated.    Fuller CanadaStanley Harrison, MD 02/17/2018 2:27 PM

## 2018-03-03 ENCOUNTER — Ambulatory Visit (INDEPENDENT_AMBULATORY_CARE_PROVIDER_SITE_OTHER): Payer: Medicare Other | Admitting: Orthopedic Surgery

## 2018-03-03 ENCOUNTER — Encounter: Payer: Self-pay | Admitting: Orthopedic Surgery

## 2018-03-03 VITALS — BP 105/63 | HR 87 | Ht 60.0 in | Wt 194.0 lb

## 2018-03-03 DIAGNOSIS — M7631 Iliotibial band syndrome, right leg: Secondary | ICD-10-CM

## 2018-03-03 NOTE — Progress Notes (Signed)
Chief Complaint  Patient presents with  . Knee Pain    right     Lateral knee pain improved with injection pain returned today, she is going to [place]  She approved for reinjection right iliotibial band  Verbal consent obtained Timeout completed to confirm site of injection  Depo-Medrol 40 lidocaine 1% 3 cc injected right iliotibial band sterile technique no complications  Follow-up in 2 weeks  Encounter Diagnosis  Name Primary?  . Iliotibial band syndrome of right side Yes

## 2018-03-17 ENCOUNTER — Ambulatory Visit: Payer: Medicare Other | Admitting: Orthopedic Surgery

## 2018-04-01 ENCOUNTER — Other Ambulatory Visit (INDEPENDENT_AMBULATORY_CARE_PROVIDER_SITE_OTHER): Payer: Self-pay | Admitting: Internal Medicine

## 2018-04-01 NOTE — Telephone Encounter (Signed)
Patient will need appointment to prior to further refills or she may get the may from her PCP. She may see Terri.

## 2018-05-26 ENCOUNTER — Ambulatory Visit (INDEPENDENT_AMBULATORY_CARE_PROVIDER_SITE_OTHER): Payer: Medicare Other | Admitting: Internal Medicine

## 2018-05-26 ENCOUNTER — Encounter (INDEPENDENT_AMBULATORY_CARE_PROVIDER_SITE_OTHER): Payer: Self-pay | Admitting: Internal Medicine

## 2018-05-26 VITALS — BP 142/78 | HR 60 | Temp 97.9°F | Ht 59.0 in | Wt 194.8 lb

## 2018-05-26 DIAGNOSIS — K2100 Gastro-esophageal reflux disease with esophagitis, without bleeding: Secondary | ICD-10-CM

## 2018-05-26 NOTE — Progress Notes (Signed)
Subjective:    Patient ID: Cynthia Mays, female    DOB: 01/30/1941, 77 y.o.   MRN: [id number]  HPI Here today for f/u. Last seen in November 2017. Hx of GERD. Underwent an EGD 10/02/2016 which revealed normal proximal esophagus and mid esophagus. LA Grade A reflux esophagitis. Multiple gastric ulcers. Portal hypertensive gastropathy. Gastritis. Normal duodenal bulb and second portion of duodenum.  She tells me she is doing pretty good. She has lost from 204 to 194 which was intentional. Has a BM daily. No melena or BRRB.  She is trying to eat more healthy.  She tries to stay on a low fat diet.   Diabetic greater than 12 yrs.  Review of Systems Past Medical History:  Diagnosis Date  . Complication of anesthesia   . Depression   . Diabetes mellitus   . Fibromyalgia   . GERD (gastroesophageal reflux disease)   . Hyperlipidemia   . Hypertension   . Hypothyroidism   . Hypothyroidism 07/23/2016  . PONV (postoperative nausea and vomiting)     Past Surgical History:  Procedure Laterality Date  . APPENDECTOMY  2002  . arthroscopy  right 2002, left 2007   bilateral  . BACK SURGERY  10/2007, 09/2008   x2  . BIOPSY  10/02/2016   Procedure: BIOPSY;  Surgeon: Malissa HippoNajeeb U Rehman, MD;  Location: [facility];  Service: Endoscopy;;  gastric  . CARPAL TUNNEL RELEASE  1986   bilateral  . CESAREAN SECTION  1969, 1967   x2  . ESOPHAGOGASTRODUODENOSCOPY N/A 10/02/2016   Procedure: ESOPHAGOGASTRODUODENOSCOPY (EGD);  Surgeon: Malissa HippoNajeeb U Rehman, MD;  Location: [facility];  Service: Endoscopy;  Laterality: N/A;  11:15 - moved to 2/1 @ 3:00  . KNEE SURGERY     both    Allergies  Allergen Reactions  . Codeine Other (See Comments)    Headache, gi upset  . Penicillins Rash    Has patient had a PCN reaction causing immediate rash, facial/tongue/throat swelling, SOB or lightheadedness with hypotension: Yes Has patient had a PCN reaction causing severe rash involving mucus membranes or skin  necrosis: No Has patient had a PCN reaction that required hospitalization No Has patient had a PCN reaction occurring within the last 10 years: No If all of the above answers are "NO", then may proceed with Cephalosporin use.   . Pravastatin     Leg cramps    Current Outpatient Medications on File Prior to Visit  Medication Sig Dispense Refill  . clobetasol cream (TEMOVATE) 0.05 % Apply 1 application topically 2 (two) times daily. (Patient taking differently: Apply 1 application topically daily as needed (rash). ) 30 g 11  . fexofenadine (ALLEGRA) 180 MG tablet Take 180 mg by mouth daily.    . fluticasone (FLONASE) 50 MCG/ACT nasal spray Place 2 sprays into the nose daily as needed for allergies.     . hydrochlorothiazide (HYDRODIURIL) 25 MG tablet Take 25 mg by mouth daily.      Marland Kitchen. HYDROcodone-acetaminophen (NORCO/VICODIN) 5-325 MG tablet Take 1 tablet by mouth every 6 (six) hours as needed for moderate pain.    Marland Kitchen. levothyroxine (SYNTHROID, LEVOTHROID) 100 MCG tablet Take 100 mcg by mouth daily before breakfast.    . lisinopril (PRINIVIL,ZESTRIL) 10 MG tablet Take 10 mg by mouth daily.      . metFORMIN (GLUCOPHAGE) 500 MG tablet Take 500 mg by mouth 2 (two) times daily.     . Multiple Vitamins-Minerals (MULTIVITAMINS THER. W/MINERALS) TABS Take 1 tablet  by mouth daily.      . naproxen sodium (ANAPROX) 220 MG tablet Take 440 mg by mouth daily as needed (pain).    Marland Kitchen NIFEdipine (PROCARDIA XL/ADALAT-CC) 60 MG 24 hr tablet Take 60 mg by mouth daily.      . pantoprazole (PROTONIX) 40 MG tablet TAKE 1 TABLET BY MOUTH TWICE DAILY BEFORE  A  MEAL 60 tablet 2  . rosuvastatin (CRESTOR) 5 MG tablet Take 5 mg by mouth daily.  5  . sertraline (ZOLOFT) 50 MG tablet Take 75 mg by mouth daily.      No current facility-administered medications on file prior to visit.         Objective:   Physical Exam Blood pressure (!) 142/78, pulse 60, temperature 97.9 F (36.6 C), height 4\' 11"  (1.499 m), weight  194 lb 12.8 oz (88.4 kg).  Alert and oriented. Skin warm and dry. Oral mucosa is moist.   . Sclera anicteric, conjunctivae is pink. Thyroid not enlarged. No cervical lymphadenopathy. Lungs clear. Heart regular rate and rhythm.  Abdomen is soft. Bowel sounds are positive. No hepatomegaly. No abdominal masses felt. No tenderness.  No edema to lower extremities.        Assessment & Plan:  GERD. Continue the Protonix BID. OV in 1 year.

## 2018-05-26 NOTE — Patient Instructions (Signed)
Continue the Protonix. OV in 1 year.  

## 2018-06-02 ENCOUNTER — Other Ambulatory Visit (INDEPENDENT_AMBULATORY_CARE_PROVIDER_SITE_OTHER): Payer: Self-pay | Admitting: Internal Medicine

## 2018-07-05 ENCOUNTER — Encounter

## 2018-07-05 ENCOUNTER — Encounter: Payer: Self-pay | Admitting: Orthopedic Surgery

## 2018-07-05 ENCOUNTER — Ambulatory Visit (INDEPENDENT_AMBULATORY_CARE_PROVIDER_SITE_OTHER): Payer: Medicare Other | Admitting: Orthopedic Surgery

## 2018-07-05 VITALS — BP 131/78 | HR 63 | Ht 59.0 in | Wt 195.0 lb

## 2018-07-05 DIAGNOSIS — M5410 Radiculopathy, site unspecified: Secondary | ICD-10-CM

## 2018-07-05 MED ORDER — GABAPENTIN 100 MG PO CAPS: 100.0000 mg | ORAL_CAPSULE | Freq: Three times a day (TID) | ORAL | 2 refills | Status: DC

## 2018-07-05 NOTE — Progress Notes (Signed)
Established patient new problem  Patient ID: Cynthia Mays, female   DOB: 1941/04/20, 77 y.o.   MRN: [id number]   Chief Complaint  Patient presents with  . Knee Pain    Right knee for 3 weeks    HPI The patient presents for evaluation of right leg pain radiating from knee to ankle. 77 years old history of lumbar fusion 11 years ago presents complaining of pain in the right knee radiating to her right ankle aggravated by raking her yard 2 weeks ago.  She says the pain starts in the knee radiates to the ankle become severe unrelieved by Aleve a muscle relaxer and hydrocodone Quality dull   Review of Systems  Constitutional: Negative for chills, fever, malaise/fatigue and weight loss.  Gastrointestinal: Negative for constipation.       Denies loss bowel control   Genitourinary:       Denies urinary retention or los of bladder control    Current Meds  Medication Sig  . clobetasol cream (TEMOVATE) 0.05 % Apply 1 application topically 2 (two) times daily. (Patient taking differently: Apply 1 application topically daily as needed (rash). )  . fexofenadine (ALLEGRA) 180 MG tablet Take 180 mg by mouth daily.  . fluticasone (FLONASE) 50 MCG/ACT nasal spray Place 2 sprays into the nose daily as needed for allergies.   . hydrochlorothiazide (HYDRODIURIL) 25 MG tablet Take 25 mg by mouth daily.    Marland Kitchen HYDROcodone-acetaminophen (NORCO/VICODIN) 5-325 MG tablet Take 1 tablet by mouth every 6 (six) hours as needed for moderate pain.  Marland Kitchen levothyroxine (SYNTHROID, LEVOTHROID) 100 MCG tablet Take 100 mcg by mouth daily before breakfast.  . lisinopril (PRINIVIL,ZESTRIL) 10 MG tablet Take 10 mg by mouth daily.    . metFORMIN (GLUCOPHAGE) 500 MG tablet Take 500 mg by mouth 2 (two) times daily.   . Multiple Vitamins-Minerals (MULTIVITAMINS THER. W/MINERALS) TABS Take 1 tablet by mouth daily.    . naproxen sodium (ANAPROX) 220 MG tablet Take 440 mg by mouth daily as needed (pain).  Marland Kitchen NIFEdipine (PROCARDIA  XL/ADALAT-CC) 60 MG 24 hr tablet Take 60 mg by mouth daily.    . pantoprazole (PROTONIX) 40 MG tablet TAKE 1 TABLET BY MOUTH TWICE DAILY BEFORE  A  MEAL  . rosuvastatin (CRESTOR) 5 MG tablet Take 5 mg by mouth daily.  . sertraline (ZOLOFT) 50 MG tablet Take 75 mg by mouth daily.     Past Medical History:  Diagnosis Date  . Complication of anesthesia   . Depression   . Diabetes mellitus   . Fibromyalgia   . GERD (gastroesophageal reflux disease)   . Hyperlipidemia   . Hypertension   . Hypothyroidism   . Hypothyroidism 07/23/2016  . PONV (postoperative nausea and vomiting)      Allergies  Allergen Reactions  . Codeine Other (See Comments)    Headache, gi upset  . Penicillins Rash    Has patient had a PCN reaction causing immediate rash, facial/tongue/throat swelling, SOB or lightheadedness with hypotension: Yes Has patient had a PCN reaction causing severe rash involving mucus membranes or skin necrosis: No Has patient had a PCN reaction that required hospitalization No Has patient had a PCN reaction occurring within the last 10 years: No If all of the above answers are "NO", then may proceed with Cephalosporin use.   . Pravastatin     Leg cramps     BP 131/78   Pulse 63   Ht 4\' 11"  (1.499 m)   Wt 195 lb (  88.5 kg)   BMI 39.39 kg/m    Physical Exam General appearance normal Oriented x3 normal Mood pleasant affect normal Gait veins normal  Ortho Exam Tenderness in the right L5 dermatome along the anterior compartment as well as lower back and right side SI joint and right lower back  Right and left lower extremity no asymmetry crepitation or malalignment full range of motion no instability muscle strength and tone normal skin intact pulses good and sensation normal with negative straight leg raises   MEDICAL DECISION MAKING   Imaging:  No new imaging   Encounter Diagnosis  Name Primary?  . Radicular leg pain Yes     PLAN: (RX., injection,  surgery,frx,mri/ct, XR 2 body ares) Start gabapentin 100 mg 3 times a day follow-up in 6 weeks  No orders of the defined types were placed in this encounter.  11:09 AM 07/05/2018

## 2018-08-16 ENCOUNTER — Ambulatory Visit (INDEPENDENT_AMBULATORY_CARE_PROVIDER_SITE_OTHER): Payer: Medicare Other | Admitting: Orthopedic Surgery

## 2018-08-16 ENCOUNTER — Encounter: Payer: Self-pay | Admitting: Orthopedic Surgery

## 2018-08-16 VITALS — BP 114/71 | HR 73 | Ht 59.0 in | Wt 196.0 lb

## 2018-08-16 DIAGNOSIS — M7051 Other bursitis of knee, right knee: Secondary | ICD-10-CM

## 2018-08-16 DIAGNOSIS — M7631 Iliotibial band syndrome, right leg: Secondary | ICD-10-CM

## 2018-08-16 NOTE — Patient Instructions (Addendum)
Ice twice a day  Topical medication twice a day  Bursitis Bursitis is inflammation and irritation of a bursa, which is one of the small, fluid-filled sacs that cushion and protect the moving parts of your body. These sacs are located between bones and muscles, muscle attachments, or skin areas next to bones. A bursa protects these structures from the wear and tear that results from frequent movement. An inflamed bursa causes pain and swelling. Fluid may build up inside the sac. Bursitis is most common near joints, especially the knees, elbows, hips, and shoulders. What are the causes? Bursitis can be caused by:  Injury from: ? A direct blow, like falling on your knee or elbow. ? Overuse of a joint (repetitive stress).  Infection. This can happen if bacteria gets into a bursa through a cut or scrape near a joint.  Diseases that cause joint inflammation, such as gout and rheumatoid arthritis.  What increases the risk? You may be at risk for bursitis if you:  Have a job or hobby that involves a lot of repetitive stress on your joints.  Have a condition that weakens your body's defense system (immune system), such as diabetes, cancer, or HIV.  Lift and reach overhead often.  Kneel or lean on hard surfaces often.  Run or walk often.  What are the signs or symptoms? The most common signs and symptoms of bursitis are:  Pain that gets worse when you move the affected body part or put weight on it.  Inflammation.  Stiffness.  Other signs and symptoms may include:  Redness.  Tenderness.  Warmth.  Pain that continues after rest.  Fever and chills. This may occur in bursitis caused by infection.  How is this diagnosed? Bursitis may be diagnosed by:  Medical history and physical exam.  MRI.  A procedure to drain fluid from the bursa with a needle (aspiration). The fluid may be checked for signs of infection or gout.  Blood tests to rule out other causes of  inflammation.  How is this treated? Bursitis can usually be treated at home with rest, ice, compression, and elevation (RICE). For mild bursitis, RICE treatment may be all you need. Other treatments may include:  Nonsteroidal anti-inflammatory drugs (NSAIDs) to treat pain and inflammation.  Corticosteroids to fight inflammation. You may have these drugs injected into and around the area of bursitis.  Aspiration of bursitis fluid to relieve pain and improve movement.  Antibiotic medicine to treat an infected bursa.  A splint, brace, or walking aid.  Physical therapy if you continue to have pain or limited movement.  Surgery to remove a damaged or infected bursa. This may be needed if you have a very bad case of bursitis or if other treatments have not worked.  Follow these instructions at home:  Take medicines only as directed by your health care provider.  If you were prescribed an antibiotic medicine, finish it all even if you start to feel better.  Rest the affected area as directed by your health care provider. ? Keep the area elevated. ? Avoid activities that make pain worse.  Apply ice to the injured area: ? Place ice in a plastic bag. ? Place a towel between your skin and the bag. ? Leave the ice on for 20 minutes, 2-3 times a day.  Use splints, braces, pads, or walking aids as directed by your health care provider.  Keep all follow-up visits as directed by your health care provider. This is important. How is  this prevented?  Wear knee pads if you kneel often.  Wear sturdy running or walking shoes that fit you well.  Take regular breaks from repetitive activity.  Warm up by stretching before doing any strenuous activity.  Maintain a healthy weight or lose weight as recommended by your health care provider. Ask your health care provider if you need help.  Exercise regularly. Start any new physical activity gradually. Contact a health care provider if:  Your  bursitis is not responding to treatment or home care.  You have a fever.  You have chills. This information is not intended to replace advice given to you by your health care provider. Make sure you discuss any questions you have with your health care provider. Document Released: 08/15/2000 Document Revised: 01/24/2016 Document Reviewed: 11/07/2013 Elsevier Interactive Patient Education  2018 [employer].

## 2018-08-16 NOTE — Progress Notes (Signed)
Progress Note   Patient ID: Cynthia Mays, female   DOB: 08/12/1941, 77 y.o.   MRN: [id number]   Chief Complaint  Patient presents with  . Back Pain    right leg feels better  . Knee Pain    right     77 years old treated for right leg pain radiating from knee to ankle she had a lumbar fusion 11 years ago she was treated for iliotibial band syndrome in the past and then radicular leg pain with gabapentin 100 mg 3 times a day comes in for follow-up saying that her radicular pain has resolved but she is having pain lateral aspect right near knee at the Gertie's tubercle with swelling tightness difficulty bending and straightening the leg Pain quality dull aching    Review of Systems  Musculoskeletal: Positive for back pain and joint pain.  Neurological: Negative for tingling and weakness.   Past Medical History:  Diagnosis Date  . Complication of anesthesia   . Depression   . Diabetes mellitus   . Fibromyalgia   . GERD (gastroesophageal reflux disease)   . Hyperlipidemia   . Hypertension   . Hypothyroidism   . Hypothyroidism 07/23/2016  . PONV (postoperative nausea and vomiting)      Allergies  Allergen Reactions  . Codeine Other (See Comments)    Headache, gi upset  . Penicillins Rash    Has patient had a PCN reaction causing immediate rash, facial/tongue/throat swelling, SOB or lightheadedness with hypotension: Yes Has patient had a PCN reaction causing severe rash involving mucus membranes or skin necrosis: No Has patient had a PCN reaction that required hospitalization No Has patient had a PCN reaction occurring within the last 10 years: No If all of the above answers are "NO", then may proceed with Cephalosporin use.   . Pravastatin     Leg cramps     BP 114/71   Pulse 73   Ht 4\' 11"  (1.499 m)   Wt 196 lb (88.9 kg)   BMI 39.59 kg/m   Physical Exam Vitals signs reviewed.  Constitutional:      Appearance: Normal appearance. She is well-developed. She  is obese.  Musculoskeletal:       Legs:  Skin:    General: Skin is warm and dry.     Capillary Refill: Capillary refill takes less than 2 seconds.     Findings: No rash.  Neurological:     General: No focal deficit present.     Mental Status: She is alert and oriented to person, place, and time.     Sensory: No sensory deficit.     Motor: No weakness.     Coordination: Coordination normal.     Gait: Gait normal.     Deep Tendon Reflexes: Reflexes normal.  Psychiatric:        Mood and Affect: Mood normal.        Thought Content: Thought content normal.        Judgment: Judgment normal.      Medical decisions:   Data  Imaging:   Prior imaging x-rays are from February 2017 she had mild medial joint line narrowing an MRI from 2017 showed she had a medial meniscal tear  Encounter Diagnoses  Name Primary?  . Bursitis of other bursa of right knee Yes  . Iliotibial band syndrome of right side     PLAN:   Recommend bursal injection right knee at the iliotibial band distal insertion near Gertie's tubercle  Right  knee injection Site confirmation Verbal consent Skin cleaned with alcohol sprayed with ethyl chloride Gertie's tubercle region injected with 40 mg Depo-Medrol 3 cc 1% lidocaine tolerated well without complication  Continue ice and topical cream BenGay or Biofreeze  Follow-up if no improvement  Patient has not had x-ray of the knee in quite a while would be wise to get on if she continues to have symptoms    Fuller Canada, MD 08/16/2018 11:28 AM

## 2018-10-18 ENCOUNTER — Other Ambulatory Visit: Payer: Self-pay | Admitting: Radiology

## 2018-10-18 DIAGNOSIS — M5410 Radiculopathy, site unspecified: Secondary | ICD-10-CM

## 2018-10-18 MED ORDER — GABAPENTIN 100 MG PO CAPS: 100.0000 mg | ORAL_CAPSULE | Freq: Three times a day (TID) | ORAL | 2 refills | Status: DC

## 2018-10-18 NOTE — Telephone Encounter (Signed)
Refill request received via fax for Gabapentin   

## 2019-02-18 LAB — HM DIABETES EYE EXAM

## 2019-02-25 ENCOUNTER — Telehealth: Payer: Self-pay | Admitting: Obstetrics & Gynecology

## 2019-02-25 NOTE — Telephone Encounter (Signed)

## 2019-02-28 ENCOUNTER — Other Ambulatory Visit: Payer: Self-pay

## 2019-02-28 ENCOUNTER — Encounter: Payer: Self-pay | Admitting: Obstetrics & Gynecology

## 2019-02-28 ENCOUNTER — Ambulatory Visit (INDEPENDENT_AMBULATORY_CARE_PROVIDER_SITE_OTHER): Payer: Medicare Other | Admitting: Obstetrics & Gynecology

## 2019-02-28 VITALS — BP 136/80 | HR 89 | Ht 59.0 in | Wt 197.0 lb

## 2019-02-28 DIAGNOSIS — N904 Leukoplakia of vulva: Secondary | ICD-10-CM

## 2019-02-28 MED ORDER — CLOBETASOL PROPIONATE 0.05 % EX CREA: 1.0000 "application " | TOPICAL_CREAM | Freq: Every day | CUTANEOUS | 11 refills | Status: DC | PRN

## 2019-02-28 NOTE — Progress Notes (Signed)
Chief Complaint  Patient presents with  . Follow-up      79 y.o. No obstetric history on file. No LMP recorded. Patient is postmenopausal. The current method of family planning is post menopausal status.  Outpatient Encounter Medications as of 02/28/2019  Medication Sig  . clobetasol cream (TEMOVATE) 1.88 % Apply 1 application topically daily as needed (rash).  . diazepam (VALIUM) 5 MG tablet Take 5 mg by mouth every 6 (six) hours as needed for anxiety.  . fexofenadine (ALLEGRA) 180 MG tablet Take 180 mg by mouth daily.  . fluticasone (FLONASE) 50 MCG/ACT nasal spray Place 2 sprays into the nose daily as needed for allergies.   Marland Kitchen HYDROcodone-acetaminophen (NORCO/VICODIN) 5-325 MG tablet Take 1 tablet by mouth every 6 (six) hours as needed for moderate pain.  Marland Kitchen levothyroxine (EUTHYROX) 112 MCG tablet Take 112 mcg by mouth daily before breakfast.  . lisinopril (PRINIVIL,ZESTRIL) 10 MG tablet Take 10 mg by mouth daily.    . metFORMIN (GLUCOPHAGE) 500 MG tablet Take 500 mg by mouth 2 (two) times daily.   . Multiple Vitamins-Minerals (MULTIVITAMINS THER. W/MINERALS) TABS Take 1 tablet by mouth daily.    Marland Kitchen NIFEdipine (PROCARDIA XL/ADALAT-CC) 60 MG 24 hr tablet Take 60 mg by mouth daily.    . pantoprazole (PROTONIX) 40 MG tablet TAKE 1 TABLET BY MOUTH TWICE DAILY BEFORE  A  MEAL  . rosuvastatin (CRESTOR) 5 MG tablet Take 5 mg by mouth daily.  . sertraline (ZOLOFT) 50 MG tablet Take 75 mg by mouth daily.   . [DISCONTINUED] clobetasol cream (TEMOVATE) 4.16 % Apply 1 application topically 2 (two) times daily. (Patient taking differently: Apply 1 application topically daily as needed (rash). )  . gabapentin (NEURONTIN) 100 MG capsule Take 1 capsule (100 mg total) by mouth 3 (three) times daily. (Patient not taking: Reported on 02/28/2019)  . hydrochlorothiazide (HYDRODIURIL) 25 MG tablet Take 25 mg by mouth daily.    . naproxen sodium (ANAPROX) 220 MG tablet Take 440 mg by mouth daily as  needed (pain).  . pantoprazole (PROTONIX) 40 MG tablet TAKE 1 TABLET BY MOUTH TWICE DAILY BEFORE  A  MEAL (Patient not taking: Reported on 02/28/2019)  . [DISCONTINUED] levothyroxine (SYNTHROID, LEVOTHROID) 100 MCG tablet Take 100 mcg by mouth daily before breakfast.   No facility-administered encounter medications on file as of 02/28/2019.     Subjective Pt diagnosed with LSA, on clobesol with excellent response with complete resolution of symptoms No further complaints Past Medical History:  Diagnosis Date  . Complication of anesthesia   . Depression   . Diabetes mellitus   . Fibromyalgia   . GERD (gastroesophageal reflux disease)   . Hyperlipidemia   . Hypertension   . Hypothyroidism   . Hypothyroidism 07/23/2016  . PONV (postoperative nausea and vomiting)     Past Surgical History:  Procedure Laterality Date  . APPENDECTOMY  2002  . arthroscopy  right 2002, left 2007   bilateral  . BACK SURGERY  10/2007, 09/2008   x2  . BIOPSY  10/02/2016   Procedure: BIOPSY;  Surgeon: Rogene Houston, MD;  Location: [facility];  Service: Endoscopy;;  gastric  . [place]   bilateral  . [place]   x2  . ESOPHAGOGASTRODUODENOSCOPY N/A 10/02/2016   Procedure: ESOPHAGOGASTRODUODENOSCOPY (EGD);  Surgeon: Rogene Houston, MD;  Location: [facility];  Service: Endoscopy;  Laterality: N/A;  11:15 - moved to 2/1 @ 3:00  .  KNEE SURGERY     both    OB History   No obstetric history on file.     Allergies  Allergen Reactions  . Codeine Other (See Comments)    Headache, gi upset  . Penicillins Rash    Has patient had a PCN reaction causing immediate rash, facial/tongue/throat swelling, SOB or lightheadedness with hypotension: Yes Has patient had a PCN reaction causing severe rash involving mucus membranes or skin necrosis: No Has patient had a PCN reaction that required hospitalization No Has patient had a PCN reaction occurring within the last 10  years: No If all of the above answers are "NO", then may proceed with Cephalosporin use.   . Pravastatin     Leg cramps    Social History   Socioeconomic History  . Marital status: Widowed    Spouse name: Not on file  . Number of children: 4  . Years of education: Not on file  . Highest education level: Not on file  Occupational History    Employer: RETIRED  Social Needs  . Financial resource strain: Not on file  . Food insecurity    Worry: Not on file    Inability: Not on file  . Transportation needs    Medical: Not on file    Non-medical: Not on file  Tobacco Use  . Smoking status: Former Smoker    Packs/day: 1.00    Years: 43.00    Pack years: 43.00    Types: Cigarettes  . Smokeless tobacco: Never Used  . Tobacco comment: Quit about 20 years ago.   Substance and Sexual Activity  . Alcohol use: No    Comment: occasionally wine  . Drug use: No  . Sexual activity: Yes    Birth control/protection: Post-menopausal  Lifestyle  . Physical activity    Days per week: Not on file    Minutes per session: Not on file  . Stress: Not on file  Relationships  . Social Musicianconnections    Talks on phone: Not on file    Gets together: Not on file    Attends religious service: Not on file    Active member of club or organization: Not on file    Attends meetings of clubs or organizations: Not on file    Relationship status: Not on file  Other Topics Concern  . Not on file  Social History Narrative   Lives alone.      Family History  Problem Relation Age of Onset  . Anesthesia problems Mother   . CAD Mother [id number]  . Cancer Mother        Pancreatic cancer  . Hypertension Father   . Hypotension Neg Hx   . Pseudochol deficiency Neg Hx   . Malignant hyperthermia Neg Hx     Medications:       Current Outpatient Medications:  .  clobetasol cream (TEMOVATE) 0.05 %, Apply 1 application topically daily as needed (rash)., Disp: 30 g, Rfl: 11 .  diazepam (VALIUM) 5 MG tablet, Take  5 mg by mouth every 6 (six) hours as needed for anxiety., Disp: , Rfl:  .  fexofenadine (ALLEGRA) 180 MG tablet, Take 180 mg by mouth daily., Disp: , Rfl:  .  fluticasone (FLONASE) 50 MCG/ACT nasal spray, Place 2 sprays into the nose daily as needed for allergies. , Disp: , Rfl:  .  HYDROcodone-acetaminophen (NORCO/VICODIN) 5-325 MG tablet, Take 1 tablet by mouth every 6 (six) hours as needed for moderate pain., Disp: ,  Rfl:  .  levothyroxine (EUTHYROX) 112 MCG tablet, Take 112 mcg by mouth daily before breakfast., Disp: , Rfl:  .  lisinopril (PRINIVIL,ZESTRIL) 10 MG tablet, Take 10 mg by mouth daily.  , Disp: , Rfl:  .  metFORMIN (GLUCOPHAGE) 500 MG tablet, Take 500 mg by mouth 2 (two) times daily. , Disp: , Rfl:  .  Multiple Vitamins-Minerals (MULTIVITAMINS THER. W/MINERALS) TABS, Take 1 tablet by mouth daily.  , Disp: , Rfl:  .  NIFEdipine (PROCARDIA XL/ADALAT-CC) 60 MG 24 hr tablet, Take 60 mg by mouth daily.  , Disp: , Rfl:  .  pantoprazole (PROTONIX) 40 MG tablet, TAKE 1 TABLET BY MOUTH TWICE DAILY BEFORE  A  MEAL, Disp: 60 tablet, Rfl: 5 .  rosuvastatin (CRESTOR) 5 MG tablet, Take 5 mg by mouth daily., Disp: , Rfl: 5 .  sertraline (ZOLOFT) 50 MG tablet, Take 75 mg by mouth daily. , Disp: , Rfl:  .  gabapentin (NEURONTIN) 100 MG capsule, Take 1 capsule (100 mg total) by mouth 3 (three) times daily. (Patient not taking: Reported on 02/28/2019), Disp: 90 capsule, Rfl: 2 .  hydrochlorothiazide (HYDRODIURIL) 25 MG tablet, Take 25 mg by mouth daily.  , Disp: , Rfl:  .  naproxen sodium (ANAPROX) 220 MG tablet, Take 440 mg by mouth daily as needed (pain)., Disp: , Rfl:  .  pantoprazole (PROTONIX) 40 MG tablet, TAKE 1 TABLET BY MOUTH TWICE DAILY BEFORE  A  MEAL (Patient not taking: Reported on 02/28/2019), Disp: 60 tablet, Rfl: 2  Objective Blood pressure 136/80, pulse 89, height 4\' 11"  (1.499 m), weight 197 lb (89.4 kg).  General WDWN female NAD Vulva:  normal appearing vulva with no masses,  tenderness or lesions Vagina:  normal mucosa, no discharge Cervix:   Uterus:   Adnexa: ovaries:,      Pertinent ROS No burning with urination, frequency or urgency No nausea, vomiting or diarrhea Nor fever chills or other constitutional symptoms   Labs or studies     Impression Diagnoses this Encounter::   ICD-10-CM   1. 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 clobetasol cream (TEMOVATE) 0.05 %    Sig: Apply 1 application topically daily as needed (rash).    Dispense:  30 g    Refill:  11    Labs or Scans Ordered: No orders of the defined types were placed in this encounter.   Management:: >continue management does clobetasol qohs >follow up prn  Follow up Return if symptoms worsen or fail to improve.     All questions were answered.

## 2019-03-17 ENCOUNTER — Other Ambulatory Visit: Payer: Self-pay | Admitting: Family Medicine

## 2019-03-17 MED ORDER — LEVOTHYROXINE SODIUM 112 MCG PO TABS: 112.0000 ug | ORAL_TABLET | Freq: Every day | ORAL | 0 refills | Status: DC

## 2019-03-17 NOTE — Telephone Encounter (Signed)
Labs and meds already in epic - sent in #30 to last until appt  Will do labs the day of OV here with rakes.

## 2019-03-30 ENCOUNTER — Other Ambulatory Visit: Payer: Self-pay

## 2019-03-31 ENCOUNTER — Encounter: Payer: Self-pay | Admitting: Family Medicine

## 2019-03-31 ENCOUNTER — Ambulatory Visit (INDEPENDENT_AMBULATORY_CARE_PROVIDER_SITE_OTHER): Payer: Medicare Other | Admitting: Family Medicine

## 2019-03-31 ENCOUNTER — Encounter (INDEPENDENT_AMBULATORY_CARE_PROVIDER_SITE_OTHER): Payer: Self-pay

## 2019-03-31 VITALS — BP 136/77 | HR 62 | Temp 97.5°F | Ht 59.0 in | Wt 192.0 lb

## 2019-03-31 DIAGNOSIS — M5441 Lumbago with sciatica, right side: Secondary | ICD-10-CM

## 2019-03-31 DIAGNOSIS — I10 Essential (primary) hypertension: Secondary | ICD-10-CM

## 2019-03-31 DIAGNOSIS — K219 Gastro-esophageal reflux disease without esophagitis: Secondary | ICD-10-CM

## 2019-03-31 DIAGNOSIS — E039 Hypothyroidism, unspecified: Secondary | ICD-10-CM | POA: Diagnosis not present

## 2019-03-31 DIAGNOSIS — E785 Hyperlipidemia, unspecified: Secondary | ICD-10-CM

## 2019-03-31 DIAGNOSIS — Z7689 Persons encountering health services in other specified circumstances: Secondary | ICD-10-CM | POA: Diagnosis not present

## 2019-03-31 DIAGNOSIS — I152 Hypertension secondary to endocrine disorders: Secondary | ICD-10-CM

## 2019-03-31 DIAGNOSIS — G8929 Other chronic pain: Secondary | ICD-10-CM

## 2019-03-31 DIAGNOSIS — E1159 Type 2 diabetes mellitus with other circulatory complications: Secondary | ICD-10-CM

## 2019-03-31 DIAGNOSIS — E119 Type 2 diabetes mellitus without complications: Secondary | ICD-10-CM

## 2019-03-31 DIAGNOSIS — E1169 Type 2 diabetes mellitus with other specified complication: Secondary | ICD-10-CM

## 2019-03-31 DIAGNOSIS — E669 Obesity, unspecified: Secondary | ICD-10-CM | POA: Insufficient documentation

## 2019-03-31 MED ORDER — ROSUVASTATIN CALCIUM 5 MG PO TABS: 5.0000 mg | ORAL_TABLET | Freq: Every day | ORAL | 1 refills | Status: DC

## 2019-03-31 NOTE — Patient Instructions (Signed)
Health Maintenance After Age 77 After age 77, you are at a higher risk for certain long-term diseases and infections as well as injuries from falls. Falls are a major cause of broken bones and head injuries in people who are older than age 77. Getting regular preventive care can help to keep you healthy and well. Preventive care includes getting regular testing and making lifestyle changes as recommended by your health care provider. Talk with your health care provider about:  Which screenings and tests you should have. A screening is a test that checks for a disease when you have no symptoms.  A diet and exercise plan that is right for you. What should I know about screenings and tests to prevent falls? Screening and testing are the best ways to find a health problem early. Early diagnosis and treatment give you the best chance of managing medical conditions that are common after age 77. Certain conditions and lifestyle choices may make you more likely to have a fall. Your health care provider may recommend:  Regular vision checks. Poor vision and conditions such as cataracts can make you more likely to have a fall. If you wear glasses, make sure to get your prescription updated if your vision changes.  Medicine review. Work with your health care provider to regularly review all of the medicines you are taking, including over-the-counter medicines. Ask your health care provider about any side effects that may make you more likely to have a fall. Tell your health care provider if any medicines that you take make you feel dizzy or sleepy.  Osteoporosis screening. Osteoporosis is a condition that causes the bones to get weaker. This can make the bones weak and cause them to break more easily.  Blood pressure screening. Blood pressure changes and medicines to control blood pressure can make you feel dizzy.  Strength and balance checks. Your health care provider may recommend certain tests to check your  strength and balance while standing, walking, or changing positions.  Foot health exam. Foot pain and numbness, as well as not wearing proper footwear, can make you more likely to have a fall.  Depression screening. You may be more likely to have a fall if you have a fear of falling, feel emotionally low, or feel unable to do activities that you used to do.  Alcohol use screening. Using too much alcohol can affect your balance and may make you more likely to have a fall. What actions can I take to lower my risk of falls? General instructions  Talk with your health care provider about your risks for falling. Tell your health care provider if: ? You fall. Be sure to tell your health care provider about all falls, even ones that seem minor. ? You feel dizzy, sleepy, or off-balance.  Take over-the-counter and prescription medicines only as told by your health care provider. These include any supplements.  Eat a healthy diet and maintain a healthy weight. A healthy diet includes low-fat dairy products, low-fat (lean) meats, and fiber from whole grains, beans, and lots of fruits and vegetables. Home safety  Remove any tripping hazards, such as rugs, cords, and clutter.  Install safety equipment such as grab bars in bathrooms and safety rails on stairs.  Keep rooms and walkways well-lit. Activity   Follow a regular exercise program to stay fit. This will help you maintain your balance. Ask your health care provider what types of exercise are appropriate for you.  If you need a cane or   walker, use it as recommended by your health care provider.  Wear supportive shoes that have nonskid soles. Lifestyle  Do not drink alcohol if your health care provider tells you not to drink.  If you drink alcohol, limit how much you have: ? 0-1 drink a day for women. ? 0-2 drinks a day for men.  Be aware of how much alcohol is in your drink. In the [place] one drink equals one typical bottle of beer (12  oz), one-half glass of wine (5 oz), or one shot of hard liquor (1 oz).  Do not use any products that contain nicotine or tobacco, such as cigarettes and e-cigarettes. If you need help quitting, ask your health care provider. Summary  Having a healthy lifestyle and getting preventive care can help to protect your health and wellness after age 77.  Screening and testing are the best way to find a health problem early and help you avoid having a fall. Early diagnosis and treatment give you the best chance for managing medical conditions that are more common for people who are older than age 77.  Falls are a major cause of broken bones and head injuries in people who are older than age 77. Take precautions to prevent a fall at home.  Work with your health care provider to learn what changes you can make to improve your health and wellness and to prevent falls. This information is not intended to replace advice given to you by your health care provider. Make sure you discuss any questions you have with your health care provider. Document Released: 07/01/2017 Document Revised: 12/09/2018 Document Reviewed: 07/01/2017 [employer] Patient Education  2020 [employer]  

## 2019-03-31 NOTE — Progress Notes (Signed)
Subjective:  Patient ID: Cynthia Mays, female    DOB: 03/22/1941, 78 y.o.   MRN: [id number]  Patient Care Team: Baruch Gouty, FNP as PCP - General (Family Medicine)   Chief Complaint:  Establish Care, Medical Management of Chronic Issues, Hyperlipidemia, Hypertension, Hypothyroidism, and Gastroesophageal Reflux   HPI: Cynthia Mays is a 78 y.o. female presenting on 03/31/2019 for Establish Care, Medical Management of Chronic Issues, Hyperlipidemia, Hypertension, Hypothyroidism, and Gastroesophageal Reflux   1. Encounter to establish care  Pt presents today to establish care. Pt was previously seeing Dr. Edrick Oh who has retired. Pt states her last OV with [facility] was in April. States at this visit her synthroid dose was increased due to an elevated TSH. States she had her thyroid rechecked due to abnormal weight gain. She states she is compliant with all of her medications and is doing fairly well.    2. Hypertension associated with diabetes ([place])  Complaint with meds - Yes Checking BP at home ranging Yes, 1128-135/78-82 Exercising Regularly - No Watching Salt intake - Yes Pertinent ROS:  Headache - No Chest pain - No Dyspnea - No Palpitations - No LE edema - No They report good compliance with medications and can restate their regimen by memory. No medication side effects.  BP Readings from Last 3 Encounters:  03/31/19 136/77  02/28/19 136/80  08/16/18 114/71    3. Acquired hypothyroidism  Synthroid dose change in April due to elevated TSH. Has been tolerating new dose well. Has lost about 4 pounds since dose change. No skin, hair, of nail changes. No bowel changes. No mood changes. No heat or cold intolerance.    4. Gastroesophageal reflux disease without esophagitis  Well controlled on Pantoprazole. No sore throat, water brash, voice change, dysphagia, hemoptysis, melena, or hematochezia.    5. Hyperlipidemia associated with type 2 diabetes mellitus ([place])   Compliant with medications without associated side effects. Only on Crestor 5 mg due to intolerance of higher doses. Tolerating this dose well.    6. Chronic midline low back pain with right-sided sciatica  Is followed by ortho. Has PRN valium and hydrocodone. States she has not taken any of these medications in a long time. No saddle anesthesia, bowel or bladder incontinence, weakness, loss of function, weight loss, fever, diaphoresis, or chills. Does have intermittent pain in right leg.   7. Type 2 diabetes mellitus without complication, without long-term current use of insulin [facility])   Patient presents for follow up of diabetes. Current symptoms include: none. Symptoms have been well-controlled. Patient denies foot ulcerations, hyperglycemia, hypoglycemia , increased appetite, nausea, paresthesia of the feet, polydipsia, polyuria, visual disturbances, vomiting and weight loss.   Known diabetic complications: cardiovascular disease Cardiovascular risk factors: advanced age (older than 24 for men, 37 for women), diabetes mellitus, dyslipidemia, hypertension, obesity (BMI >= 30 kg/m2) and sedentary lifestyle Current diabetic medications include oral agent (monotherapy): metformin (generic).  Compliant with meds - Yes  Eye exam current (within one year): yes, records requested from [facility] Weight trend: stable Prior visit with dietician: no Current diet: in general, a "healthy" diet   Current exercise: none  Current monitoring regimen: none  Is She on ACE inhibitor or angiotensin II receptor blocker?  Yes  lisinopril (Prinivil)     Relevant past medical, surgical, family, and social history reviewed and updated as indicated.  Allergies and medications reviewed and updated. Date reviewed: Chart in Epic.   Past Medical History:  Diagnosis  Date  . Complication of anesthesia   . Depression   . Diabetes mellitus   . Fibromyalgia   . GERD (gastroesophageal reflux disease)   .  Hiatal hernia   . Hyperlipidemia   . Hypertension   . Hypothyroidism   . Hypothyroidism 07/23/2016  . PONV (postoperative nausea and vomiting)     Past Surgical History:  Procedure Laterality Date  . APPENDECTOMY  2002  . arthroscopy  right 2002, left 2007   bilateral knees  . BACK SURGERY  10/2007, 09/2008   x2  . BIOPSY  10/02/2016   Procedure: BIOPSY;  Surgeon: Rogene Houston, MD;  [facility];  Service: Endoscopy;;  gastric  . [place]   bilateral  . [place]   x2  . ESOPHAGOGASTRODUODENOSCOPY N/A 10/02/2016   Procedure: ESOPHAGOGASTRODUODENOSCOPY (EGD);  Surgeon: Rogene Houston, MD;  Location: [facility];  Service: Endoscopy;  Laterality: N/A;  11:15 - moved to 2/1 @ 3:00  . KNEE SURGERY     both    Social History   Socioeconomic History  . Marital status: Widowed    Spouse name: Not on file  . Number of children: 4  . Years of education: Not on file  . Highest education level: Not on file  Occupational History    Employer: RETIRED  Social Needs  . Financial resource strain: Not on file  . Food insecurity    Worry: Not on file    Inability: Not on file  . Transportation needs    Medical: Not on file    Non-medical: Not on file  Tobacco Use  . Smoking status: Former Smoker    Packs/day: 1.00    Years: 43.00    Pack years: 43.00    Types: Cigarettes  . Smokeless tobacco: Never Used  . Tobacco comment: > 20 years quit  Substance and Sexual Activity  . Alcohol use: No    Comment: occasionally wine  . Drug use: No  . Sexual activity: Yes    Birth control/protection: Post-menopausal  Lifestyle  . Physical activity    Days per week: Not on file    Minutes per session: Not on file  . Stress: Not on file  Relationships  . Social Herbalist on phone: Not on file    Gets together: Not on file    Attends religious service: Not on file    Active member of club or organization: Not on file     Attends meetings of clubs or organizations: Not on file    Relationship status: Not on file  . Intimate partner violence    Fear of current or ex partner: Not on file    Emotionally abused: Not on file    Physically abused: Not on file    Forced sexual activity: Not on file  Other Topics Concern  . Not on file  Social History Narrative   Lives alone.  Widowed     Outpatient Encounter Medications as of 03/31/2019  Medication Sig  . clobetasol cream (TEMOVATE) 2.33 % Apply 1 application topically daily as needed (rash).  . fexofenadine (ALLEGRA) 180 MG tablet Take 180 mg by mouth daily.  . fluticasone (FLONASE) 50 MCG/ACT nasal spray Place 2 sprays into the nose daily as needed for allergies.   . hydrochlorothiazide (HYDRODIURIL) 25 MG tablet Take 25 mg by mouth daily.    Marland Kitchen HYDROcodone-acetaminophen (NORCO/VICODIN) 5-325 MG tablet Take 1 tablet  by mouth every 6 (six) hours as needed for moderate pain.  Marland Kitchen levothyroxine (EUTHYROX) 112 MCG tablet Take 1 tablet (112 mcg total) by mouth daily before breakfast.  . lisinopril (PRINIVIL,ZESTRIL) 10 MG tablet Take 10 mg by mouth daily.    . metFORMIN (GLUCOPHAGE) 500 MG tablet Take 500 mg by mouth 2 (two) times daily.   . Multiple Vitamins-Minerals (MULTIVITAMINS THER. W/MINERALS) TABS Take 1 tablet by mouth daily.    . naproxen sodium (ANAPROX) 220 MG tablet Take 440 mg by mouth daily as needed (pain).  Marland Kitchen NIFEdipine (PROCARDIA XL/ADALAT-CC) 60 MG 24 hr tablet Take 60 mg by mouth daily.    . pantoprazole (PROTONIX) 40 MG tablet TAKE 1 TABLET BY MOUTH TWICE DAILY BEFORE  A  MEAL  . rosuvastatin (CRESTOR) 5 MG tablet Take 1 tablet (5 mg total) by mouth daily.  . sertraline (ZOLOFT) 50 MG tablet Take 75 mg by mouth daily.   . [DISCONTINUED] diazepam (VALIUM) 5 MG tablet Take 5 mg by mouth every 6 (six) hours as needed for anxiety.  . [DISCONTINUED] rosuvastatin (CRESTOR) 5 MG tablet Take 5 mg by mouth daily.  . sucralfate (CARAFATE) 1 GM/10ML  suspension TAKE 10 ML BY MOUTH 4 TIMES DAILY WITH MEALS AND AT BEDTIME  . [DISCONTINUED] gabapentin (NEURONTIN) 100 MG capsule Take 1 capsule (100 mg total) by mouth 3 (three) times daily. (Patient not taking: Reported on 02/28/2019)  . [DISCONTINUED] pantoprazole (PROTONIX) 40 MG tablet TAKE 1 TABLET BY MOUTH TWICE DAILY BEFORE  A  MEAL (Patient not taking: Reported on 02/28/2019)   No facility-administered encounter medications on file as of 03/31/2019.     Allergies  Allergen Reactions  . Codeine Other (See Comments)    Headache, gi upset  . Penicillins Rash    Has patient had a PCN reaction causing immediate rash, facial/tongue/throat swelling, SOB or lightheadedness with hypotension: Yes Has patient had a PCN reaction causing severe rash involving mucus membranes or skin necrosis: No Has patient had a PCN reaction that required hospitalization No Has patient had a PCN reaction occurring within the last 10 years: No If all of the above answers are "NO", then may proceed with Cephalosporin use.   . Pravastatin     Leg cramps    Review of Systems  Constitutional: Negative for activity change, appetite change, chills, diaphoresis, fatigue, fever and unexpected weight change.  HENT: Negative for sore throat, trouble swallowing and voice change.   Eyes: Negative for photophobia and visual disturbance.  Respiratory: Negative for cough, choking, chest tightness and shortness of breath.   Cardiovascular: Negative for chest pain, palpitations and leg swelling.  Gastrointestinal: Negative for abdominal pain, anal bleeding, blood in stool, constipation, diarrhea, nausea and vomiting.  Endocrine: Negative for cold intolerance, heat intolerance, polydipsia, polyphagia and polyuria.  Genitourinary: Negative for decreased urine volume, difficulty urinating and dysuria.  Musculoskeletal: Positive for arthralgias and myalgias.  Neurological: Negative for dizziness, tremors, seizures, syncope, facial  asymmetry, speech difficulty, weakness, light-headedness, numbness and headaches.  Hematological: Negative for adenopathy. Does not bruise/bleed easily.  Psychiatric/Behavioral: Negative for confusion and sleep disturbance.  All other systems reviewed and are negative.       Objective:  BP 136/77   Pulse 62   Temp (!) 97.5 F (36.4 C) (Oral)   Ht 4' 11" (1.499 m)   Wt 192 lb (87.1 kg)   BMI 38.78 kg/m    Wt Readings from Last 3 Encounters:  03/31/19 192 lb (87.1 kg)  02/28/19  197 lb (89.4 kg)  08/16/18 196 lb (88.9 kg)    Physical Exam Vitals signs and nursing note reviewed.  Constitutional:      General: She is not in acute distress.    Appearance: Normal appearance. She is well-developed and well-groomed. She is obese. She is not ill-appearing, toxic-appearing or diaphoretic.  HENT:     Head: Normocephalic and atraumatic.     Jaw: There is normal jaw occlusion.     Right Ear: Hearing normal.     Left Ear: Hearing normal.     Nose: Nose normal.     Mouth/Throat:     Lips: Pink.     Mouth: Mucous membranes are moist.     Pharynx: Oropharynx is clear. Uvula midline.  Eyes:     General: Lids are normal.     Extraocular Movements: Extraocular movements intact.     Conjunctiva/sclera: Conjunctivae normal.     Pupils: Pupils are equal, round, and reactive to light.  Neck:     Musculoskeletal: Normal range of motion and neck supple.     Thyroid: No thyroid mass, thyromegaly or thyroid tenderness.     Vascular: No carotid bruit or JVD.     Trachea: Trachea and phonation normal.  Cardiovascular:     Rate and Rhythm: Normal rate and regular rhythm.     Chest Wall: PMI is not displaced.     Pulses: Normal pulses.     Heart sounds: Normal heart sounds. No murmur. No friction rub. No gallop.   Pulmonary:     Effort: Pulmonary effort is normal. No respiratory distress.     Breath sounds: Normal breath sounds. No wheezing.  Abdominal:     General: Abdomen is protuberant.  Bowel sounds are normal. There is no distension or abdominal bruit.     Palpations: Abdomen is soft. There is no hepatomegaly or splenomegaly.     Tenderness: There is no abdominal tenderness. There is no right CVA tenderness or left CVA tenderness.     Hernia: No hernia is present.  Musculoskeletal:     Right hip: Normal.     Left hip: Normal.     Cervical back: Normal.     Thoracic back: She exhibits decreased range of motion. She exhibits no tenderness, no bony tenderness, no swelling, no edema, no deformity, no laceration, no pain, no spasm and normal pulse.     Lumbar back: She exhibits decreased range of motion, tenderness and pain. She exhibits no bony tenderness, no swelling, no edema, no deformity, no laceration, no spasm and normal pulse.     Right lower leg: No edema.     Left lower leg: No edema.     Comments: Positive right straight leg raise test  Lymphadenopathy:     Cervical: No cervical adenopathy.  Skin:    General: Skin is warm and dry.     Capillary Refill: Capillary refill takes less than 2 seconds.     Coloration: Skin is not cyanotic, jaundiced or pale.     Findings: No rash.  Neurological:     General: No focal deficit present.     Mental Status: She is alert and oriented to person, place, and time.     Cranial Nerves: Cranial nerves are intact.     Sensory: Sensation is intact.     Motor: Motor function is intact.     Coordination: Coordination is intact.     Gait: Gait is intact.     Deep Tendon Reflexes:  Reflexes are normal and symmetric.  Psychiatric:        Attention and Perception: Attention and perception normal.        Mood and Affect: Mood and affect normal.        Speech: Speech normal.        Behavior: Behavior normal. Behavior is cooperative.        Thought Content: Thought content normal.        Cognition and Memory: Cognition and memory normal.        Judgment: Judgment normal.     Results for orders placed or performed during the hospital  encounter of 10/02/16  Glucose, capillary  Result Value Ref Range   Glucose-Capillary 136 (H) 65 - 99 mg/dL       Pertinent labs & imaging results that were available during my care of the patient were reviewed by me and considered in my medical decision making.  Assessment & Plan:  Libbi was seen today for establish care, medical management of chronic issues, hyperlipidemia, hypertension, hypothyroidism and gastroesophageal reflux.  Diagnoses and all orders for this visit:  Encounter to establish care -     CMP14+EGFR -     CBC with Differential/Platelet -     Lipid panel -     Thyroid Panel With TSH -     Microalbumin / creatinine urine ratio -     rosuvastatin (CRESTOR) 5 MG tablet; Take 1 tablet (5 mg total) by mouth daily.  Hypertension associated with diabetes ([place]) DASH diet and exercise encouraged. Labs pending. Continue medications as prescribed.  -     CMP14+EGFR -     CBC with Differential/Platelet -     Thyroid Panel With TSH -     Microalbumin / creatinine urine ratio  Acquired hypothyroidism Labs pending, will adjust therapy if warranted.  -     Thyroid Panel With TSH  Gastroesophageal reflux disease without esophagitis Well controlled. Continue medications as prescribed. Report any new or worsening symptoms.  -     CBC with Differential/Platelet  Hyperlipidemia associated with type 2 diabetes mellitus ([place]) Labs pending. Diet and exercise encouraged. Continue medications as prescribed.  -     Lipid panel -     rosuvastatin (CRESTOR) 5 MG tablet; Take 1 tablet (5 mg total) by mouth daily.  Chronic midline low back pain with right-sided sciatica Followed by ortho. Has been doing fairly well since last surgery.   Type 2 diabetes mellitus without complication, without long-term current use of insulin ([place]) Labs pending. Last A1C 6.3 on 01/06/2019. Will adjust therapy if warranted. Continue medications as prescribed. Diet and exercise encouraged.   Morbid  obesity ([place]) Diet and exercise encouraged. Repeat BMI at next visit.     Continue all other maintenance medications.  Follow up plan: Return in about 3 months (around 07/01/2019), or if symptoms worsen or fail to improve, for HTN, thyroid, lipids.  Educational handout given for health maintenance   The above assessment and management plan was discussed with the patient. The patient verbalized understanding of and has agreed to the management plan. Patient is aware to call the clinic if symptoms persist or worsen. Patient is aware when to return to the clinic for a follow-up visit. Patient educated on when it is appropriate to go to the emergency department.   Monia Pouch, FNP-C [facility] [phone] 03/31/19

## 2019-04-01 LAB — CMP14+EGFR
ALT: 13 IU/L (ref 0–32)
AST: 21 IU/L (ref 0–40)
Albumin/Globulin Ratio: 1.7 (ref 1.2–2.2)
Albumin: 4.5 g/dL (ref 3.7–4.7)
Alkaline Phosphatase: 94 IU/L (ref 39–117)
BUN/Creatinine Ratio: 22 (ref 12–28)
BUN: 22 mg/dL (ref 8–27)
Bilirubin Total: 0.4 mg/dL (ref 0.0–1.2)
CO2: 22 mmol/L (ref 20–29)
Calcium: 9.7 mg/dL (ref 8.7–10.3)
Chloride: 104 mmol/L (ref 96–106)
Creatinine, Ser: 1.01 mg/dL — ABNORMAL HIGH (ref 0.57–1.00)
GFR calc Af Amer: 62 mL/min/{1.73_m2} (ref >59)
GFR calc non Af Amer: 54 mL/min/{1.73_m2} — ABNORMAL LOW (ref >59)
Globulin, Total: 2.7 g/dL (ref 1.5–4.5)
Glucose: 133 mg/dL — ABNORMAL HIGH (ref 65–99)
Potassium: 4.4 mmol/L (ref 3.5–5.2)
Sodium: 144 mmol/L (ref 134–144)
Total Protein: 7.2 g/dL (ref 6.0–8.5)

## 2019-04-01 LAB — MICROALBUMIN / CREATININE URINE RATIO
Creatinine, Urine: 149 mg/dL
Microalb/Creat Ratio: 6 mg/g creat (ref 0–29)
Microalbumin, Urine: 9.4 ug/mL

## 2019-04-01 LAB — CBC WITH DIFFERENTIAL/PLATELET
Basophils Absolute: 0 10*3/uL (ref 0.0–0.2)
Basos: 1 %
EOS (ABSOLUTE): 0.2 10*3/uL (ref 0.0–0.4)
Eos: 3 %
Hematocrit: 41.4 % (ref 34.0–46.6)
Hemoglobin: 13.7 g/dL (ref 11.1–15.9)
Immature Grans (Abs): 0 10*3/uL (ref 0.0–0.1)
Immature Granulocytes: 0 %
Lymphocytes Absolute: 1.9 10*3/uL (ref 0.7–3.1)
Lymphs: 31 %
MCH: 29.3 pg (ref 26.6–33.0)
MCHC: 33.1 g/dL (ref 31.5–35.7)
MCV: 89 fL (ref 79–97)
Monocytes Absolute: 0.5 10*3/uL (ref 0.1–0.9)
Monocytes: 7 %
Neutrophils Absolute: 3.7 10*3/uL (ref 1.4–7.0)
Neutrophils: 58 %
Platelets: 217 10*3/uL (ref 150–450)
RBC: 4.67 x10E6/uL (ref 3.77–5.28)
RDW: 13.4 % (ref 11.7–15.4)
WBC: 6.3 10*3/uL (ref 3.4–10.8)

## 2019-04-01 LAB — THYROID PANEL WITH TSH
Free Thyroxine Index: 2.2 (ref 1.2–4.9)
T3 Uptake Ratio: 27 % (ref 24–39)
T4, Total: 8 ug/dL (ref 4.5–12.0)
TSH: 1.18 u[IU]/mL (ref 0.450–4.500)

## 2019-04-01 LAB — LIPID PANEL
Chol/HDL Ratio: 3.5 ratio (ref 0.0–4.4)
Cholesterol, Total: 190 mg/dL (ref 100–199)
HDL: 54 mg/dL (ref >39)
LDL Calculated: 101 mg/dL — ABNORMAL HIGH (ref 0–99)
Triglycerides: 175 mg/dL — ABNORMAL HIGH (ref 0–149)
VLDL Cholesterol Cal: 35 mg/dL (ref 5–40)

## 2019-04-04 ENCOUNTER — Encounter: Payer: Self-pay | Admitting: *Deleted

## 2019-04-05 LAB — SPECIMEN STATUS REPORT

## 2019-04-05 LAB — HGB A1C W/O EAG: Hgb A1c MFr Bld: 6.4 % — ABNORMAL HIGH (ref 4.8–5.6)

## 2019-04-20 ENCOUNTER — Other Ambulatory Visit: Payer: Self-pay | Admitting: Family Medicine

## 2019-04-25 ENCOUNTER — Other Ambulatory Visit: Payer: Self-pay | Admitting: Family Medicine

## 2019-04-25 MED ORDER — NIFEDIPINE ER OSMOTIC RELEASE 60 MG PO TB24: 60.0000 mg | ORAL_TABLET | Freq: Every day | ORAL | 1 refills | Status: DC

## 2019-04-25 MED ORDER — HYDROCHLOROTHIAZIDE 25 MG PO TABS: 25.0000 mg | ORAL_TABLET | Freq: Every day | ORAL | 1 refills | Status: DC

## 2019-04-25 MED ORDER — LISINOPRIL 10 MG PO TABS: 10.0000 mg | ORAL_TABLET | Freq: Every day | ORAL | 1 refills | Status: DC

## 2019-04-25 MED ORDER — METFORMIN HCL 500 MG PO TABS: 500.0000 mg | ORAL_TABLET | Freq: Two times a day (BID) | ORAL | 1 refills | Status: DC

## 2019-04-25 MED ORDER — SERTRALINE HCL 50 MG PO TABS: 75.0000 mg | ORAL_TABLET | Freq: Every day | ORAL | 1 refills | Status: DC

## 2019-05-28 NOTE — Progress Notes (Signed)
Subjective:    Patient ID: Cynthia Mays, female    DOB: 09-13-1940, 78 y.o.   MRN: [id number]  HPI Cynthia Mays is a 78 year old female with a past medical history of hypertension, depression, fibromyalgia, DM II, hypothyroidism and GERD.  She presents today for her annual follow-up.  She is taking pantoprazole 40 mg twice daily and Carafate 1 g p.o. twice daily.  She complains of occasional heartburn which is sometimes worse after eating and eating foods such as a salad.  One week ago, she was awakened at 5:30AM with stabbing pain between the breast area.  No nausea or vomiting.  She got out of bed and walked around and took a dose of Carafate and his pain abated.  She denies having any further episodes of chest pain.  She reported having a cardiac stress test 1 year ago that was normal.  No history of heart disease.  She is taking Aleve 220mg  1 tablet 2 days monthly as needed for aches and pains.  No alcohol.  She is passing a normal formed brown bowel movement most days.  No rectal bleeding or hematochezia.  I am unable to find any record of a screening colonoscopy in her chart.  She reports having a colonoscopy possibly for than 5 years ago, possibly done in [place], she cannot recall the details.  No family history of colorectal cancer.  Her mother had pancreatic cancer.  Her most recent EGD was February 2018 which showed esophagitis and fundic gland polyps, see results below.  Labs 03/31/2019: Sodium 144.  Potassium 4.4.  Glucose 133.  BUN 22.  Creatinine 1.01.  Calcium 9.7.  Albumin 4.5.  AST 21.  ALT 13.  Total bili 0.4.  WBC 6.3.  Hemoglobin 13.7.  Hematocrit 41.4.  Platelet 217.  EGD 10/02/2016: - Normal proximal esophagus and mid esophagus. - LA Grade A reflux esophagitis. - Z-line irregular, 33 cm from the incisors. - 3 cm hiatal hernia. - Multiple gastric fundic gland polyps. - Portal hypertensive gastropathy. - Gastritis. Chronic inactive gastritis. No evidence of H. Pylori.   - Normal duodenal bulb and second portion of the duodenum.  Past Medical History:  Diagnosis Date  . Complication of anesthesia   . Depression   . Diabetes mellitus   . Fibromyalgia   . GERD (gastroesophageal reflux disease)   . Hiatal hernia   . Hyperlipidemia   . Hypertension   . Hypothyroidism   . Hypothyroidism 07/23/2016  . PONV (postoperative nausea and vomiting)    Past Surgical History:  Procedure Laterality Date  . APPENDECTOMY  2002  . arthroscopy  right 2002, left 2007   bilateral knees  . BACK SURGERY  10/2007, 09/2008   x2  . BIOPSY  10/02/2016   Procedure: BIOPSY;  Surgeon: Rogene Houston, MD;  [facility];  Service: Endoscopy;;  gastric  . [place]   bilateral  . [place]   x2  . ESOPHAGOGASTRODUODENOSCOPY N/A 10/02/2016   Procedure: ESOPHAGOGASTRODUODENOSCOPY (EGD);  Surgeon: Rogene Houston, MD;  [facility];  Service: Endoscopy;  Laterality: N/A;  11:15 - moved to 2/1 @ 3:00  . KNEE SURGERY     both   Family History  Problem Relation Age of Onset  . Anesthesia problems Mother   . CAD Mother 54  . Cancer Mother        Pancreatic cancer  . Hypertension Father   . Suicidality  Father        gunshot  . Deafness Sister   . Heart disease Brother   . Suicidality Son   . Cancer Sister        female   . Suicidality Brother   . Hypotension Neg Hx   . Pseudochol deficiency Neg Hx   . Malignant hyperthermia Neg Hx    Current Outpatient Medications on File Prior to Visit  Medication Sig Dispense Refill  . naproxen sodium (ALEVE) 220 MG tablet Take 220 mg by mouth daily as needed.    . clobetasol cream (TEMOVATE) 0.05 % Apply 1 application topically daily as needed (rash). 30 g 11  . EUTHYROX 112 MCG tablet TAKE 1 TABLET BY MOUTH ONCE DAILY BEFORE BREAKFAST 90 tablet 3  . fexofenadine (ALLEGRA) 180 MG tablet Take 180 mg by mouth daily.    . fluticasone (FLONASE) 50 MCG/ACT nasal spray Place 2  sprays into the nose daily as needed for allergies.     . hydrochlorothiazide (HYDRODIURIL) 25 MG tablet Take 1 tablet (25 mg total) by mouth daily. 90 tablet 1  . HYDROcodone-acetaminophen (NORCO/VICODIN) 5-325 MG tablet Take 1 tablet by mouth every 6 (six) hours as needed for moderate pain.    Marland Kitchen. lisinopril (ZESTRIL) 10 MG tablet Take 1 tablet (10 mg total) by mouth daily. 90 tablet 1  . metFORMIN (GLUCOPHAGE) 500 MG tablet Take 1 tablet (500 mg total) by mouth 2 (two) times daily. 180 tablet 1  . Multiple Vitamins-Minerals (MULTIVITAMINS THER. W/MINERALS) TABS Take 1 tablet by mouth daily.      Marland Kitchen. NIFEdipine (PROCARDIA XL/NIFEDICAL XL) 60 MG 24 hr tablet Take 1 tablet (60 mg total) by mouth daily. 90 tablet 1  . pantoprazole (PROTONIX) 40 MG tablet TAKE 1 TABLET BY MOUTH TWICE DAILY BEFORE  A  MEAL 60 tablet 5  . rosuvastatin (CRESTOR) 5 MG tablet Take 1 tablet (5 mg total) by mouth daily. 90 tablet 1  . sertraline (ZOLOFT) 50 MG tablet Take 1.5 tablets (75 mg total) by mouth daily. 135 tablet 1  . sucralfate (CARAFATE) 1 GM/10ML suspension TAKE 10 ML BY MOUTH 4 TIMES DAILY WITH MEALS AND AT BEDTIME     No current facility-administered medications on file prior to visit.    Allergies  Allergen Reactions  . Codeine Other (See Comments)    Headache, gi upset  . Penicillins Rash    Has patient had a PCN reaction causing immediate rash, facial/tongue/throat swelling, SOB or lightheadedness with hypotension: Yes Has patient had a PCN reaction causing severe rash involving mucus membranes or skin necrosis: No Has patient had a PCN reaction that required hospitalization No Has patient had a PCN reaction occurring within the last 10 years: No If all of the above answers are "NO", then may proceed with Cephalosporin use.   . Pravastatin     Leg cramps    Review of Systems see HPI, all other systems reviewed and are negative     Objective:   Physical Exam BP 119/64   Pulse 78   Temp 98 F  (36.7 C)   Ht 4\' 11"  (1.499 m)   Wt 195 lb 12.8 oz (88.8 kg)   BMI 39.55 kg/m  General: 78 year old female well-developed in no acute distress Eyes: Sclera nonicteric, conjunctiva pink Mouth: Absent dentition, no ulcers or lesions Neck: Supple, no lymphadenopathy or thyromegaly Heart: Regular rate and rhythm, no murmurs Lungs: Breath sounds clear throughout Abdomen: Soft, nontender, no masses or organomegaly Extremities: Bilateral  trace lower extremity edema Neuro: Alert and oriented x4    Assessment & Plan:   73.  78 year old female with GERD, esophagitis, with 1 episode of pain between the breast which awakened her at 5:30 AM approximately 1 week ago without recurrence.  No history of heart disease.  Patient reports a cardiac stress test 1 year ago was negative.  No known history of gallstones. -Continue Pantoprazole 40 mg p.o. twice daily -Add Famotidine 40 mg 1 tab at bedtime -Schedule abdominal ultrasound to evaluate the gallbladder -Advised patient to go to the emergency room if chest pain recurs -Repeat CBC, CMP and add lipase level if biliary colic type symptoms recur -Further follow-up to be determined after the above results received  2.  Colon cancer screening, she reports having a colonoscopy in the past but it is unclear when this was done -Request a copy of her most recent colonoscopy from her PCP

## 2019-05-30 ENCOUNTER — Encounter (INDEPENDENT_AMBULATORY_CARE_PROVIDER_SITE_OTHER): Payer: Self-pay | Admitting: *Deleted

## 2019-05-30 ENCOUNTER — Ambulatory Visit (INDEPENDENT_AMBULATORY_CARE_PROVIDER_SITE_OTHER): Payer: Medicare Other | Admitting: Nurse Practitioner

## 2019-05-30 ENCOUNTER — Encounter (INDEPENDENT_AMBULATORY_CARE_PROVIDER_SITE_OTHER): Payer: Self-pay | Admitting: Nurse Practitioner

## 2019-05-30 ENCOUNTER — Other Ambulatory Visit: Payer: Self-pay

## 2019-05-30 VITALS — BP 119/64 | HR 78 | Temp 98.0°F | Ht 59.0 in | Wt 195.8 lb

## 2019-05-30 DIAGNOSIS — R10826 Epigastric rebound abdominal tenderness: Secondary | ICD-10-CM | POA: Diagnosis not present

## 2019-05-30 DIAGNOSIS — R1013 Epigastric pain: Secondary | ICD-10-CM | POA: Diagnosis not present

## 2019-05-30 DIAGNOSIS — R10816 Epigastric abdominal tenderness: Secondary | ICD-10-CM | POA: Insufficient documentation

## 2019-05-30 DIAGNOSIS — K219 Gastro-esophageal reflux disease without esophagitis: Secondary | ICD-10-CM

## 2019-05-30 MED ORDER — FAMOTIDINE 40 MG PO TABS: 40.0000 mg | ORAL_TABLET | Freq: Every day | ORAL | 1 refills | Status: DC

## 2019-05-30 NOTE — Patient Instructions (Signed)
1. Add Famotidine 40mg  one tab at bed time  2. Schedule an abdominal sonogram to check your gallbladder  3. I will contact your primary doctor to verify when you had a colonoscopy

## 2019-06-03 ENCOUNTER — Ambulatory Visit (HOSPITAL_COMMUNITY)
Admission: RE | Admit: 2019-06-03 | Discharge: 2019-06-03 | Disposition: A | Discharge status: Home / Self Care | Payer: Medicare Other | Source: Ambulatory Visit | Attending: Nurse Practitioner | Admitting: Nurse Practitioner

## 2019-06-03 ENCOUNTER — Other Ambulatory Visit: Payer: Self-pay

## 2019-06-03 DIAGNOSIS — R1013 Epigastric pain: Secondary | ICD-10-CM

## 2019-06-06 ENCOUNTER — Telehealth (INDEPENDENT_AMBULATORY_CARE_PROVIDER_SITE_OTHER): Payer: Self-pay | Admitting: Nurse Practitioner

## 2019-06-06 ENCOUNTER — Other Ambulatory Visit (INDEPENDENT_AMBULATORY_CARE_PROVIDER_SITE_OTHER): Payer: Self-pay | Admitting: Nurse Practitioner

## 2019-06-06 DIAGNOSIS — K2100 Gastro-esophageal reflux disease with esophagitis, without bleeding: Secondary | ICD-10-CM

## 2019-06-06 MED ORDER — LANSOPRAZOLE 30 MG PO CPDR: 30.0000 mg | DELAYED_RELEASE_CAPSULE | Freq: Two times a day (BID) | ORAL | 1 refills | Status: DC

## 2019-06-06 NOTE — Telephone Encounter (Signed)
I called the patient discuss her abdominal ultrasound was normal.  She continues to have reflux type symptoms, she is not had any further nighttime episodes of mid esophageal pain.  She has a 3 cm hiatal hernia is identified on her EGD in 2018 which may be attributing to her symptoms as well.  Refer to her last office visit.  She is taking pantoprazole 40 mg twice daily since 2018.  Will stop the pantoprazole and she will try lansoprazole 30 mg p.o. twice daily.  She will continue with famotidine 40 mg at bedtime.  I discussed scheduling an EGD if her symptoms persist or worsen.  She will call me in 2 weeks with further update and further evaluation will be determined at that point.

## 2019-06-06 NOTE — Telephone Encounter (Signed)
Patient left message regarding [place] test results  -  Ph# [phone]

## 2019-07-01 ENCOUNTER — Ambulatory Visit: Payer: Medicare Other | Admitting: Family Medicine

## 2019-07-01 ENCOUNTER — Encounter: Payer: Self-pay | Admitting: Family Medicine

## 2019-07-01 ENCOUNTER — Ambulatory Visit (INDEPENDENT_AMBULATORY_CARE_PROVIDER_SITE_OTHER): Payer: Medicare Other | Admitting: Family Medicine

## 2019-07-01 DIAGNOSIS — E119 Type 2 diabetes mellitus without complications: Secondary | ICD-10-CM

## 2019-07-01 NOTE — Progress Notes (Addendum)
Pt needs to be seen in office for blood work. Pt aware and will make follow up appointment.

## 2019-07-20 ENCOUNTER — Other Ambulatory Visit: Payer: Self-pay

## 2019-07-21 ENCOUNTER — Ambulatory Visit (INDEPENDENT_AMBULATORY_CARE_PROVIDER_SITE_OTHER): Payer: Medicare Other | Admitting: Family Medicine

## 2019-07-21 ENCOUNTER — Encounter: Payer: Self-pay | Admitting: Family Medicine

## 2019-07-21 VITALS — BP 139/83 | HR 62 | Temp 96.6°F | Resp 20 | Ht 59.0 in | Wt 192.0 lb

## 2019-07-21 DIAGNOSIS — T8141XA Infection following a procedure, superficial incisional surgical site, initial encounter: Secondary | ICD-10-CM

## 2019-07-21 DIAGNOSIS — E119 Type 2 diabetes mellitus without complications: Secondary | ICD-10-CM

## 2019-07-21 DIAGNOSIS — E1169 Type 2 diabetes mellitus with other specified complication: Secondary | ICD-10-CM

## 2019-07-21 DIAGNOSIS — E785 Hyperlipidemia, unspecified: Secondary | ICD-10-CM

## 2019-07-21 DIAGNOSIS — I10 Essential (primary) hypertension: Secondary | ICD-10-CM

## 2019-07-21 DIAGNOSIS — I152 Hypertension secondary to endocrine disorders: Secondary | ICD-10-CM

## 2019-07-21 DIAGNOSIS — E1159 Type 2 diabetes mellitus with other circulatory complications: Secondary | ICD-10-CM | POA: Diagnosis not present

## 2019-07-21 DIAGNOSIS — E039 Hypothyroidism, unspecified: Secondary | ICD-10-CM | POA: Diagnosis not present

## 2019-07-21 LAB — BAYER DCA HB A1C WAIVED: HB A1C (BAYER DCA - WAIVED): 6.1 % (ref <7.0)

## 2019-07-21 MED ORDER — DOXYCYCLINE HYCLATE 100 MG PO TABS: 100.0000 mg | ORAL_TABLET | Freq: Two times a day (BID) | ORAL | 0 refills | Status: AC

## 2019-07-21 MED ORDER — LEVOTHYROXINE SODIUM 112 MCG PO TABS: ORAL_TABLET | ORAL | 3 refills | Status: DC

## 2019-07-21 NOTE — Patient Instructions (Signed)

## 2019-07-21 NOTE — Progress Notes (Signed)
Subjective:  Patient ID: Cynthia Mays, female    DOB: 06/05/1941, 78 y.o.   MRN: [id number]  Patient Care Team: Baruch Gouty, FNP as PCP - General (Family Medicine)   Chief Complaint:  Medical Management of Chronic Issues (3 mo ), Hyperlipidemia, Diabetes, Hypertension, and Hypothyroidism   HPI: Cynthia Mays is a 78 y.o. female presenting on 07/21/2019 for Medical Management of Chronic Issues (3 mo ), Hyperlipidemia, Diabetes, Hypertension, and Hypothyroidism   1. Type 2 diabetes mellitus without complication, without long-term current use of insulin (HCC) Pt presents for follow up evaluation of Type 2 diabetes mellitus.  Current symptoms include none. Patient denies foot ulcerations, hyperglycemia, hypoglycemia , increased appetite, nausea, paresthesia of the feet, polydipsia, polyuria, visual disturbances, vomiting and weight loss.  Current diabetic medications include metformin Compliant with meds - Yes  Current monitoring regimen: home blood tests - daily Home blood sugar records: trend: stable Any episodes of hypoglycemia? no  Known diabetic complications: cardiovascular disease Cardiovascular risk factors: advanced age (older than 78 for men, 47 for women), diabetes mellitus, dyslipidemia, hypertension, obesity (BMI >= 30 kg/m2) and sedentary lifestyle Eye exam current (within one year): yes Podiatry yearly?  Yes Weight trend: stable Current diet: in general, a "healthy" diet   Current exercise: none  PNA Vaccine UTD?  Yes Hep B Vaccine?  Yes Tdap Vaccine UTD?  Yes Urine microalbumin UTD? Yes  Is She on ACE inhibitor or angiotensin II receptor blocker?  Yes, lisinopril Is She on statin? Yes crestor Is She on ASA 81 mg daily?  No   2. Hypertension associated with diabetes ([place]) Complaint with meds - Yes Current Medications - lisinopril, procardia, HCTZ Checking BP at home ranging 130/70 Exercising Regularly - No Watching Salt intake - Yes  Pertinent ROS:  Headache - No Fatigue - No Visual Disturbances - No Chest pain - No Dyspnea - No Palpitations - No LE edema - No They report good compliance with medications and can restate their regimen by memory. No medication side effects.  Family, social, and smoking history reviewed.   BP Readings from Last 3 Encounters:  07/21/19 139/83  05/30/19 119/64  03/31/19 136/77   CMP Latest Ref Rng & Units 03/31/2019 04/04/2016 06/04/2012  Glucose 65 - 99 mg/dL 133(H) 151(H) 121(H)  BUN 8 - 27 mg/dL 22 25(H) 21  Creatinine 0.57 - 1.00 mg/dL 1.01(H) 1.18(H) 0.94  Sodium 134 - 144 mmol/L 144 139 141  Potassium 3.5 - 5.2 mmol/L 4.4 3.8 3.4(L)  Chloride 96 - 106 mmol/L 104 104 104  CO2 20 - 29 mmol/L '22 27 27  '$ Calcium 8.7 - 10.3 mg/dL 9.7 9.8 9.0  Total Protein 6.0 - 8.5 g/dL 7.2 8.6(H) -  Total Bilirubin 0.0 - 1.2 mg/dL 0.4 0.6 -  Alkaline Phos 39 - 117 IU/L 94 87 -  AST 0 - 40 IU/L 21 24 -  ALT 0 - 32 IU/L 13 16 -      3. Acquired hypothyroidism Compliant with medications - Yes Current medications - Euthyrox Adverse side effects - No Weight - stable  Bowel habit changes - No Heat or cold intolerance - No Mood changes - No Changes in sleep habits - No Fatigue - No Skin, hair, or nail changes - No Tremor - No Palpitations - No Edema - No Shortness of breath - No  Lab Results  Component Value Date   TSH 1.180 03/31/2019     4. Hyperlipidemia associated with type 2  diabetes mellitus ([place]) Compliant with medications - Yes Current medications - crestor Side effects from medications - No Diet - generally healthy Exercise - none  Lab Results  Component Value Date   CHOL 190 03/31/2019   HDL 54 03/31/2019   LDLCALC 101 (H) 03/31/2019   TRIG 175 (H) 03/31/2019   CHOLHDL 3.5 03/31/2019     Family and personal medical history reviewed. Smoking and ETOH history reviewed.       Relevant past medical, surgical, family, and social history reviewed and updated as  indicated.  Allergies and medications reviewed and updated. Date reviewed: Chart in Epic.   Past Medical History:  Diagnosis Date  . Complication of anesthesia   . Depression   . Diabetes mellitus   . Fibromyalgia   . GERD (gastroesophageal reflux disease)   . Hiatal hernia   . Hyperlipidemia   . Hypertension   . Hypothyroidism   . Hypothyroidism 07/23/2016  . PONV (postoperative nausea and vomiting)     Past Surgical History:  Procedure Laterality Date  . APPENDECTOMY  2002  . arthroscopy  right 2002, left 2007   bilateral knees  . BACK SURGERY  10/2007, 09/2008   x2  . BIOPSY  10/02/2016   Procedure: BIOPSY;  Surgeon: Rogene Houston, MD;  Location: [facility];  Service: Endoscopy;;  gastric  . [place]   bilateral  . [place]   x2  . ESOPHAGOGASTRODUODENOSCOPY N/A 10/02/2016   Procedure: ESOPHAGOGASTRODUODENOSCOPY (EGD);  Surgeon: Rogene Houston, MD;  Location: [facility];  Service: Endoscopy;  Laterality: N/A;  11:15 - moved to 2/1 @ 3:00  . KNEE SURGERY     both    Social History   Socioeconomic History  . Marital status: Widowed    Spouse name: Not on file  . Number of children: 4  . Years of education: Not on file  . Highest education level: Not on file  Occupational History    Employer: RETIRED  Social Needs  . Financial resource strain: Not on file  . Food insecurity    Worry: Not on file    Inability: Not on file  . Transportation needs    Medical: Not on file    Non-medical: Not on file  Tobacco Use  . Smoking status: Former Smoker    Packs/day: 1.00    Years: 43.00    Pack years: 43.00    Types: Cigarettes  . Smokeless tobacco: Never Used  . Tobacco comment: > 20 years quit  Substance and Sexual Activity  . Alcohol use: No    Comment: occasionally wine  . Drug use: No  . Sexual activity: Yes    Birth control/protection: Post-menopausal  Lifestyle  . Physical activity    Days per week: Not on  file    Minutes per session: Not on file  . Stress: Not on file  Relationships  . Social Herbalist on phone: Not on file    Gets together: Not on file    Attends religious service: Not on file    Active member of club or organization: Not on file    Attends meetings of clubs or organizations: Not on file    Relationship status: Not on file  . Intimate partner violence    Fear of current or ex partner: Not on file    Emotionally abused: Not on file    Physically abused: Not on file  Forced sexual activity: Not on file  Other Topics Concern  . Not on file  Social History Narrative   Lives alone.  Widowed     Outpatient Encounter Medications as of 07/21/2019  Medication Sig  . clobetasol cream (TEMOVATE) 1.94 % Apply 1 application topically daily as needed (rash).  . fexofenadine (ALLEGRA) 180 MG tablet Take 180 mg by mouth daily.  . fluticasone (FLONASE) 50 MCG/ACT nasal spray Place 2 sprays into the nose daily as needed for allergies.   . hydrochlorothiazide (HYDRODIURIL) 25 MG tablet Take 1 tablet (25 mg total) by mouth daily.  Marland Kitchen levothyroxine (EUTHYROX) 112 MCG tablet TAKE 1 TABLET BY MOUTH ONCE DAILY BEFORE BREAKFAST  . lisinopril (ZESTRIL) 10 MG tablet Take 1 tablet (10 mg total) by mouth daily.  . metFORMIN (GLUCOPHAGE) 500 MG tablet Take 1 tablet (500 mg total) by mouth 2 (two) times daily.  . Multiple Vitamins-Minerals (MULTIVITAMINS THER. W/MINERALS) TABS Take 1 tablet by mouth daily.    . naproxen sodium (ALEVE) 220 MG tablet Take 220 mg by mouth daily as needed.  Marland Kitchen NIFEdipine (PROCARDIA XL/NIFEDICAL XL) 60 MG 24 hr tablet Take 1 tablet (60 mg total) by mouth daily.  . rosuvastatin (CRESTOR) 5 MG tablet Take 1 tablet (5 mg total) by mouth daily.  . sertraline (ZOLOFT) 50 MG tablet Take 1.5 tablets (75 mg total) by mouth daily.  . sucralfate (CARAFATE) 1 GM/10ML suspension TAKE 10 ML BY MOUTH 4 TIMES DAILY WITH MEALS AND AT BEDTIME  . [DISCONTINUED] EUTHYROX  112 MCG tablet TAKE 1 TABLET BY MOUTH ONCE DAILY BEFORE BREAKFAST  . [DISCONTINUED] lansoprazole (PREVACID) 30 MG capsule Take 1 capsule (30 mg total) by mouth 2 (two) times daily.  Marland Kitchen doxycycline (VIBRA-TABS) 100 MG tablet Take 1 tablet (100 mg total) by mouth 2 (two) times daily for 10 days. 1 po bid  . HYDROcodone-acetaminophen (NORCO/VICODIN) 5-325 MG tablet Take 1 tablet by mouth every 6 (six) hours as needed for moderate pain.  . [DISCONTINUED] famotidine (PEPCID) 40 MG tablet Take 1 tablet (40 mg total) by mouth at bedtime.   No facility-administered encounter medications on file as of 07/21/2019.     Allergies  Allergen Reactions  . Codeine Other (See Comments)    Headache, gi upset  . Penicillins Rash    Has patient had a PCN reaction causing immediate rash, facial/tongue/throat swelling, SOB or lightheadedness with hypotension: Yes Has patient had a PCN reaction causing severe rash involving mucus membranes or skin necrosis: No Has patient had a PCN reaction that required hospitalization No Has patient had a PCN reaction occurring within the last 10 years: No If all of the above answers are "NO", then may proceed with Cephalosporin use.   . Pravastatin     Leg cramps    Review of Systems  Constitutional: Negative for activity change, appetite change, chills, diaphoresis, fatigue, fever and unexpected weight change.  HENT: Negative.   Eyes: Negative.  Negative for photophobia and visual disturbance.  Respiratory: Negative for cough, chest tightness and shortness of breath.   Cardiovascular: Negative for chest pain, palpitations and leg swelling.  Gastrointestinal: Negative for abdominal pain, blood in stool, constipation, diarrhea, nausea and vomiting.  Endocrine: Negative.  Negative for cold intolerance, heat intolerance, polydipsia, polyphagia and polyuria.  Genitourinary: Negative for decreased urine volume, difficulty urinating, dysuria, frequency and urgency.   Musculoskeletal: Positive for arthralgias and myalgias.  Skin: Positive for color change (surgical site) and wound (surgical wound).  Allergic/Immunologic: Negative.  Neurological: Negative for dizziness, tremors, seizures, syncope, facial asymmetry, speech difficulty, weakness, light-headedness and numbness.  Hematological: Negative.   Psychiatric/Behavioral: Negative for confusion, hallucinations, sleep disturbance and suicidal ideas.  All other systems reviewed and are negative.       Objective:  BP 139/83   Pulse 62   Temp (!) 96.6 F (35.9 C)   Resp 20   Ht '4\' 11"'$  (1.499 m)   Wt 192 lb (87.1 kg)   SpO2 98%   BMI 38.78 kg/m    Wt Readings from Last 3 Encounters:  07/21/19 192 lb (87.1 kg)  05/30/19 195 lb 12.8 oz (88.8 kg)  03/31/19 192 lb (87.1 kg)    Physical Exam Vitals signs and nursing note reviewed.  Constitutional:      General: She is not in acute distress.    Appearance: Normal appearance. She is well-developed and well-groomed. She is obese. She is not ill-appearing, toxic-appearing or diaphoretic.  HENT:     Head: Normocephalic and atraumatic.     Jaw: There is normal jaw occlusion.     Right Ear: Hearing normal.     Left Ear: Hearing normal.     Nose: Nose normal.     Mouth/Throat:     Lips: Pink.     Mouth: Mucous membranes are moist.     Pharynx: Oropharynx is clear. Uvula midline.  Eyes:     General: Lids are normal.     Extraocular Movements: Extraocular movements intact.     Conjunctiva/sclera: Conjunctivae normal.     Pupils: Pupils are equal, round, and reactive to light.  Neck:     Musculoskeletal: Normal range of motion and neck supple.     Thyroid: No thyroid mass, thyromegaly or thyroid tenderness.     Vascular: No carotid bruit or JVD.     Trachea: Trachea and phonation normal.  Cardiovascular:     Rate and Rhythm: Normal rate and regular rhythm.     Chest Wall: PMI is not displaced.     Pulses: Normal pulses.     Heart sounds:  Normal heart sounds. No murmur. No friction rub. No gallop.   Pulmonary:     Effort: Pulmonary effort is normal. No respiratory distress.     Breath sounds: Normal breath sounds. No wheezing.  Abdominal:     General: Bowel sounds are normal. There is no distension or abdominal bruit.     Palpations: Abdomen is soft. There is no hepatomegaly or splenomegaly.     Tenderness: There is no abdominal tenderness. There is no right CVA tenderness or left CVA tenderness.     Hernia: No hernia is present.  Musculoskeletal:     Right lower leg: No edema.     Left lower leg: No edema.  Lymphadenopathy:     Cervical: No cervical adenopathy.  Skin:    General: Skin is warm and dry.     Capillary Refill: Capillary refill takes less than 2 seconds.     Coloration: Skin is not cyanotic, jaundiced or pale.     Findings: Erythema and wound (surgical wound to right palm) present. No rash.     Comments: Surgical wound to right palm with surrounding erythema and scant amount of purulent drainage and tenderness. See image.   Neurological:     General: No focal deficit present.     Mental Status: She is alert and oriented to person, place, and time.     Cranial Nerves: Cranial nerves are intact. No cranial nerve  deficit.     Sensory: Sensation is intact. No sensory deficit.     Motor: Motor function is intact. No weakness.     Coordination: Coordination is intact. Coordination normal.     Gait: Gait is intact. Gait normal.     Deep Tendon Reflexes: Reflexes are normal and symmetric. Reflexes normal.  Psychiatric:        Attention and Perception: Attention and perception normal.        Mood and Affect: Mood and affect normal.        Speech: Speech normal.        Behavior: Behavior normal. Behavior is cooperative.        Thought Content: Thought content normal.        Cognition and Memory: Cognition and memory normal.        Judgment: Judgment normal.       Results for orders placed or performed in  visit on 03/31/19  CMP14+EGFR  Result Value Ref Range   Glucose 133 (H) 65 - 99 mg/dL   BUN 22 8 - 27 mg/dL   Creatinine, Ser 1.01 (H) 0.57 - 1.00 mg/dL   GFR calc non Af Amer 54 (L) >59 mL/min/1.73   GFR calc Af Amer 62 >59 mL/min/1.73   BUN/Creatinine Ratio 22 12 - 28   Sodium 144 134 - 144 mmol/L   Potassium 4.4 3.5 - 5.2 mmol/L   Chloride 104 96 - 106 mmol/L   CO2 22 20 - 29 mmol/L   Calcium 9.7 8.7 - 10.3 mg/dL   Total Protein 7.2 6.0 - 8.5 g/dL   Albumin 4.5 3.7 - 4.7 g/dL   Globulin, Total 2.7 1.5 - 4.5 g/dL   Albumin/Globulin Ratio 1.7 1.2 - 2.2   Bilirubin Total 0.4 0.0 - 1.2 mg/dL   Alkaline Phosphatase 94 39 - 117 IU/L   AST 21 0 - 40 IU/L   ALT 13 0 - 32 IU/L  CBC with Differential/Platelet  Result Value Ref Range   WBC 6.3 3.4 - 10.8 x10E3/uL   RBC 4.67 3.77 - 5.28 x10E6/uL   Hemoglobin 13.7 11.1 - 15.9 g/dL   Hematocrit 41.4 34.0 - 46.6 %   MCV 89 79 - 97 fL   MCH 29.3 26.6 - 33.0 pg   MCHC 33.1 31.5 - 35.7 g/dL   RDW 13.4 11.7 - 15.4 %   Platelets 217 150 - 450 x10E3/uL   Neutrophils 58 Not Estab. %   Lymphs 31 Not Estab. %   Monocytes 7 Not Estab. %   Eos 3 Not Estab. %   Basos 1 Not Estab. %   Neutrophils Absolute 3.7 1.4 - 7.0 x10E3/uL   Lymphocytes Absolute 1.9 0.7 - 3.1 x10E3/uL   Monocytes Absolute 0.5 0.1 - 0.9 x10E3/uL   EOS (ABSOLUTE) 0.2 0.0 - 0.4 x10E3/uL   Basophils Absolute 0.0 0.0 - 0.2 x10E3/uL   Immature Granulocytes 0 Not Estab. %   Immature Grans (Abs) 0.0 0.0 - 0.1 x10E3/uL  Lipid panel  Result Value Ref Range   Cholesterol, Total 190 100 - 199 mg/dL   Triglycerides 175 (H) 0 - 149 mg/dL   HDL 54 >39 mg/dL   VLDL Cholesterol Cal 35 5 - 40 mg/dL   LDL Calculated 101 (H) 0 - 99 mg/dL   Chol/HDL Ratio 3.5 0.0 - 4.4 ratio  Thyroid Panel With TSH  Result Value Ref Range   TSH 1.180 0.450 - 4.500 uIU/mL   T4, Total 8.0 4.5 - 12.0 ug/dL  T3 Uptake Ratio 27 24 - 39 %   Free Thyroxine Index 2.2 1.2 - 4.9  Microalbumin / creatinine  urine ratio  Result Value Ref Range   Creatinine, Urine 149.0 Not Estab. mg/dL   Microalbumin, Urine 9.4 Not Estab. ug/mL   Microalb/Creat Ratio 6 0 - 29 mg/g creat  Hgb A1c w/o eAG  Result Value Ref Range   Hgb A1c MFr Bld 6.4 (H) 4.8 - 5.6 %  Specimen status report  Result Value Ref Range   specimen status report Comment        Pertinent labs & imaging results that were available during my care of the patient were reviewed by me and considered in my medical decision making.  Assessment & Plan:  Shelsy was seen today for medical management of chronic issues, hyperlipidemia, diabetes, hypertension and hypothyroidism.  Diagnoses and all orders for this visit:  Type 2 diabetes mellitus without complication, without long-term current use of insulin (HCC) A1C 6.1 today. No changes in current regimen. Eye exam at [facility], records requested. Diet and exercise encouraged. Continue medications as prescribed.  -     CBC with Differential/Platelet -     CMP14+EGFR -     Bayer DCA Hb A1c Waived  Hypertension associated with diabetes ([place]) BP well controlled. Changes were not made in regimen today. Goal BP is 130/80. Pt aware to report any persistent high or low readings. DASH diet and exercise encouraged. Exercise at least 150 minutes per week and increase as tolerated. Goal BMI > 25. Stress management encouraged. Avoid nicotine and tobacco product use. Avoid excessive alcohol and NSAID's. Avoid more than 2000 mg of sodium daily. Medications as prescribed. Follow up as scheduled.  -     CBC with Differential/Platelet -     CMP14+EGFR -     Thyroid Panel With TSH  Acquired hypothyroidism Thyroid disease has been well controlled. Labs are pending. Adjustments to regimen will be made if warranted. Make sure to take medications on an empty stomach with a full glass of water. Make sure to avoid vitamins or supplements for at least 4 hours before and 4 hours after taking medications. Repeat  labs in 3 months if adjustments are made and in 6 months if stable.   -     CBC with Differential/Platelet -     Thyroid Panel With TSH -     levothyroxine (EUTHYROX) 112 MCG tablet; TAKE 1 TABLET BY MOUTH ONCE DAILY BEFORE BREAKFAST  Hyperlipidemia associated with type 2 diabetes mellitus (HCC) Diet encouraged - increase intake of fresh fruits and vegetables, increase intake of lean proteins. Bake, broil, or grill foods. Avoid fried, greasy, and fatty foods. Avoid fast foods. Increase intake of fiber-rich whole grains. Exercise encouraged - at least 150 minutes per week and advance as tolerated.  Goal BMI < 25. Continue medications as prescribed. Follow up in 3-6 months as discussed.  -     CBC with Differential/Platelet -     CMP14+EGFR -     Lipid panel  Superficial incisional infection of surgical site Spoke with Dr. Burney Gauze. Agreed with starting pt on doxycycline and pt to follow up in office today or on Monday as scheduled. Pt notified.  -     doxycycline (VIBRA-TABS) 100 MG tablet; Take 1 tablet (100 mg total) by mouth 2 (two) times daily for 10 days. 1 po bid     Continue all other maintenance medications.  Follow up plan: Return in about 3 months (  around 10/21/2019), or if symptoms worsen or fail to improve, for DM.  Continue healthy lifestyle choices, including diet (rich in fruits, vegetables, and lean proteins, and low in salt and simple carbohydrates) and exercise (at least 30 minutes of moderate physical activity daily).  Educational handout given for DM  The above assessment and management plan was discussed with the patient. The patient verbalized understanding of and has agreed to the management plan. Patient is aware to call the clinic if they develop any new symptoms or if symptoms persist or worsen. Patient is aware when to return to the clinic for a follow-up visit. Patient educated on when it is appropriate to go to the emergency department.   Monia Pouch, FNP-C  [facility] [phone]

## 2019-07-22 LAB — CMP14+EGFR
ALT: 13 IU/L (ref 0–32)
AST: 26 IU/L (ref 0–40)
Albumin/Globulin Ratio: 1.4 (ref 1.2–2.2)
Albumin: 4.1 g/dL (ref 3.7–4.7)
Alkaline Phosphatase: 94 IU/L (ref 39–117)
BUN/Creatinine Ratio: 18 (ref 12–28)
BUN: 17 mg/dL (ref 8–27)
Bilirubin Total: 0.3 mg/dL (ref 0.0–1.2)
CO2: 21 mmol/L (ref 20–29)
Calcium: 9.1 mg/dL (ref 8.7–10.3)
Chloride: 105 mmol/L (ref 96–106)
Creatinine, Ser: 0.97 mg/dL (ref 0.57–1.00)
GFR calc Af Amer: 65 mL/min/{1.73_m2} (ref >59)
GFR calc non Af Amer: 57 mL/min/{1.73_m2} — ABNORMAL LOW (ref >59)
Globulin, Total: 2.9 g/dL (ref 1.5–4.5)
Glucose: 100 mg/dL — ABNORMAL HIGH (ref 65–99)
Potassium: 4.4 mmol/L (ref 3.5–5.2)
Sodium: 140 mmol/L (ref 134–144)
Total Protein: 7 g/dL (ref 6.0–8.5)

## 2019-07-22 LAB — CBC WITH DIFFERENTIAL/PLATELET
Basophils Absolute: 0 10*3/uL (ref 0.0–0.2)
Basos: 1 %
EOS (ABSOLUTE): 0.2 10*3/uL (ref 0.0–0.4)
Eos: 3 %
Hematocrit: 38.5 % (ref 34.0–46.6)
Hemoglobin: 12.6 g/dL (ref 11.1–15.9)
Immature Grans (Abs): 0 10*3/uL (ref 0.0–0.1)
Immature Granulocytes: 0 %
Lymphocytes Absolute: 1.5 10*3/uL (ref 0.7–3.1)
Lymphs: 30 %
MCH: 28.6 pg (ref 26.6–33.0)
MCHC: 32.7 g/dL (ref 31.5–35.7)
MCV: 87 fL (ref 79–97)
Monocytes Absolute: 0.4 10*3/uL (ref 0.1–0.9)
Monocytes: 9 %
Neutrophils Absolute: 2.8 10*3/uL (ref 1.4–7.0)
Neutrophils: 57 %
Platelets: 185 10*3/uL (ref 150–450)
RBC: 4.41 x10E6/uL (ref 3.77–5.28)
RDW: 13.4 % (ref 11.7–15.4)
WBC: 4.9 10*3/uL (ref 3.4–10.8)

## 2019-07-22 LAB — LIPID PANEL
Chol/HDL Ratio: 4.5 ratio — ABNORMAL HIGH (ref 0.0–4.4)
Cholesterol, Total: 184 mg/dL (ref 100–199)
HDL: 41 mg/dL (ref >39)
LDL Chol Calc (NIH): 113 mg/dL — ABNORMAL HIGH (ref 0–99)
Triglycerides: 173 mg/dL — ABNORMAL HIGH (ref 0–149)
VLDL Cholesterol Cal: 30 mg/dL (ref 5–40)

## 2019-07-22 LAB — THYROID PANEL WITH TSH
Free Thyroxine Index: 2 (ref 1.2–4.9)
T3 Uptake Ratio: 29 % (ref 24–39)
T4, Total: 6.9 ug/dL (ref 4.5–12.0)
TSH: 1.96 u[IU]/mL (ref 0.450–4.500)

## 2019-08-03 ENCOUNTER — Ambulatory Visit (INDEPENDENT_AMBULATORY_CARE_PROVIDER_SITE_OTHER): Payer: Medicare Other | Admitting: Nurse Practitioner

## 2019-08-03 ENCOUNTER — Other Ambulatory Visit: Payer: Self-pay

## 2019-08-03 ENCOUNTER — Encounter (INDEPENDENT_AMBULATORY_CARE_PROVIDER_SITE_OTHER): Payer: Self-pay | Admitting: Nurse Practitioner

## 2019-08-03 ENCOUNTER — Telehealth (INDEPENDENT_AMBULATORY_CARE_PROVIDER_SITE_OTHER): Payer: Self-pay | Admitting: Nurse Practitioner

## 2019-08-03 VITALS — BP 102/61 | HR 88 | Temp 97.2°F | Ht 59.0 in | Wt 189.9 lb

## 2019-08-03 DIAGNOSIS — R197 Diarrhea, unspecified: Secondary | ICD-10-CM | POA: Insufficient documentation

## 2019-08-03 DIAGNOSIS — K2101 Gastro-esophageal reflux disease with esophagitis, with bleeding: Secondary | ICD-10-CM | POA: Diagnosis not present

## 2019-08-03 NOTE — Telephone Encounter (Signed)
I called the patient to verify the last colonoscopy records reviewed received word from 2002.  She stated having a colonoscopy less than 10 years ago possibly by Dr. Gala Romney at [facility].  Have our office staff look further into her records and possibly call Dr. Rudene Re office to verify if he did a colonoscopy.  She is age of 72, reported having 3 colonoscopies in her lifetime without polyps, if this is so she may not need any further colonoscopies.  Mitzie, please contact Dr. Roseanne Kaufman office to verify if he ever did a colonoscopy on this patient and let me know the outcome thanks.

## 2019-08-03 NOTE — Progress Notes (Signed)
   Subjective:    Patient ID: Cynthia Mays, female    DOB: March 10, 1941, 78 y.o.   MRN: [id number]  HPI  Cynthia Mays is a 78 year old female with a past medical history of hypertension, depression, fibromyalgia, DM II, hypothyroidism and GERD.  She presents today with complaints of diarrhea which started 2 weeks ago.  She reports passing 1-3 loose mud-like stools on and off last week.  She had a few days with solid stool but today she passed 3 episodes of mud-like stools earlier this morning.  She denies having any nausea or vomiting.  No upper or lower abdominal pain.  She was on a course of doxycycline following right pinky trigger finger surgery, the incision site became infected.  She finished her Doxycycline a few days ago.  Her last colonoscopy was July 06, 2001 which was normal.  She was last seen in the office on 05/30/2019 with reflux and epigastric pain, these symptoms have resolved after she was prescribed Famotidine 20 mg at bedtime.  She reports being well hydrated.  She is urinating clear yellow urine.  No fever.  Mother had a history of pancreatic cancer.  No family history of colorectal cancer.  No other complaints today.  EGD 10/02/2016: - Normal proximal esophagus and mid esophagus. - LA Grade A reflux esophagitis. - Z-line irregular, 33 cm from the incisors. - 3 cm hiatal hernia. - Multiple gastric fundic gland polyps. - Portal hypertensive gastropathy. - Gastritis. Chronic inactive gastritis. No evidence of H. Pylori.  - Normal duodenal bulb and second portion of the duodenum.  Colonoscopy 07/06/2001: -There were no polyps or other neoplasms seen.  No inflammation seen.  No diverticulosis noted  Review of Systems see HPI, all other systems reviewed and are negative    Objective:   Physical Exam  BP 102/61 (BP Location: Right Arm, Patient Position: Sitting, Cuff Size: Normal)   Pulse 88   Temp (!) 97.2 F (36.2 C) (Oral)   Ht 4\' 11"  (1.499 m)   Wt 189 lb  14.4 oz (86.1 kg)   BMI 38.36 kg/m  General: 78 year old female alert in no acute distress Eyes: Sclera nonicteric, conjunctiva pink Neck: Supple Heart: Regular rate and rhythm, no murmurs Lungs: Breath sounds clear throughout Abdomen: Soft, mild right lower quadrant tenderness without rebound or guarding, nondistended, positive bowel sounds to all 4 quadrants, no HSM Extremities: No edema Neuro: Alert and orient x4, no focal deficits     Assessment & Plan:   76.  78 year old female with diarrhea adding of recent Doxycycline use for an infected right pinky finger status post trigger finger surgery 07/13/2019 -Phillips bacteria probiotic once daily -CBC, CMP -GI pathogen which includes C. difficile PCR  2.  GERD -Continue Famotidine 20 mg at bedtime -Continue Pantoprazole 40 mg once daily in the a.m.  3.  Colon cancer screening -Colonoscopy will be scheduled after the above lab results and GI panel results received

## 2019-08-03 NOTE — Patient Instructions (Addendum)
1.  Complete the provided blood and stool test order  2.  Bland diet  3.  Phillips bacteria probiotic 1 capsule by mouth once daily for the next 2 weeks  4.  Call our office if your diarrhea worsens  5.  Further follow-up to be determined after the above results received

## 2019-08-04 LAB — CBC WITH DIFFERENTIAL/PLATELET
Absolute Monocytes: 441 cells/uL (ref 200–950)
Basophils Absolute: 38 cells/uL (ref 0–200)
Basophils Relative: 0.6 %
Eosinophils Absolute: 139 cells/uL (ref 15–500)
Eosinophils Relative: 2.2 %
HCT: 39.6 % (ref 35.0–45.0)
Hemoglobin: 13.3 g/dL (ref 11.7–15.5)
Lymphs Abs: 1518 cells/uL (ref 850–3900)
MCH: 28.9 pg (ref 27.0–33.0)
MCHC: 33.6 g/dL (ref 32.0–36.0)
MCV: 86.1 fL (ref 80.0–100.0)
MPV: 10.1 fL (ref 7.5–12.5)
Monocytes Relative: 7 %
Neutro Abs: 4164 cells/uL (ref 1500–7800)
Neutrophils Relative %: 66.1 %
Platelets: 222 10*3/uL (ref 140–400)
RBC: 4.6 10*6/uL (ref 3.80–5.10)
RDW: 13.1 % (ref 11.0–15.0)
Total Lymphocyte: 24.1 %
WBC: 6.3 10*3/uL (ref 3.8–10.8)

## 2019-08-04 LAB — COMPLETE METABOLIC PANEL WITH GFR
AG Ratio: 1.4 (calc) (ref 1.0–2.5)
ALT: 10 U/L (ref 6–29)
AST: 18 U/L (ref 10–35)
Albumin: 4 g/dL (ref 3.6–5.1)
Alkaline phosphatase (APISO): 81 U/L (ref 37–153)
BUN/Creatinine Ratio: 21 (calc) (ref 6–22)
BUN: 22 mg/dL (ref 7–25)
CO2: 26 mmol/L (ref 20–32)
Calcium: 9.3 mg/dL (ref 8.6–10.4)
Chloride: 105 mmol/L (ref 98–110)
Creat: 1.07 mg/dL — ABNORMAL HIGH (ref 0.60–0.93)
GFR, Est African American: 58 mL/min/{1.73_m2} — ABNORMAL LOW (ref >=60)
GFR, Est Non African American: 50 mL/min/{1.73_m2} — ABNORMAL LOW (ref >=60)
Globulin: 2.9 g/dL (calc) (ref 1.9–3.7)
Glucose, Bld: 106 mg/dL (ref 65–139)
Potassium: 3.9 mmol/L (ref 3.5–5.3)
Sodium: 141 mmol/L (ref 135–146)
Total Bilirubin: 0.4 mg/dL (ref 0.2–1.2)
Total Protein: 6.9 g/dL (ref 6.1–8.1)

## 2019-08-04 NOTE — Telephone Encounter (Signed)
I forwarded a msg to Dr. Laural Golden to verify if he want pt to have any further colonoscopies or cologuard testing, await his response

## 2019-08-04 NOTE — Telephone Encounter (Signed)
Dr. Laural Golden, please review last office visit and this msg note. Pt thought she had a colonoscopy after her 2002 colonoscopy but I cannot locate any record to verify this. She denies having any hx of colon polyps after a total of 3 colonoscopies done in her life time. No family hx of colon cancer. She is now age 78. Do you want her to have a cologuard test, repeat colonoscopy if she is willing or no further colon cancer screening?

## 2019-08-08 NOTE — Telephone Encounter (Signed)
In addition to colonoscopy of 2002 she had another exam by me on 07/10/2005 and colonoscopy was normal. Unless patient has other significant health issues which may shorten her life some form of screening would be appropriate if she wants it.  However if diarrhea persists then she will end up with diagnostic colonoscopy. Let us presume that her diarrhea resolves in which case we can do FIT but I would wait at least 1 month before stool test is done.

## 2019-08-24 LAB — GASTROINTESTINAL PATHOGEN PANEL PCR
C. difficile Tox A/B, PCR: NOT DETECTED
Campylobacter, PCR: NOT DETECTED
Cryptosporidium, PCR: NOT DETECTED
E coli (ETEC) LT/ST PCR: NOT DETECTED
E coli (STEC) stx1/stx2, PCR: NOT DETECTED
E coli 0157, PCR: NOT DETECTED
Giardia lamblia, PCR: NOT DETECTED
Norovirus, PCR: NOT DETECTED
Rotavirus A, PCR: NOT DETECTED
Salmonella, PCR: NOT DETECTED
Shigella, PCR: NOT DETECTED

## 2019-09-05 DIAGNOSIS — M25641 Stiffness of right hand, not elsewhere classified: Secondary | ICD-10-CM | POA: Diagnosis not present

## 2019-09-05 DIAGNOSIS — M25541 Pain in joints of right hand: Secondary | ICD-10-CM | POA: Diagnosis not present

## 2019-09-05 DIAGNOSIS — M6281 Muscle weakness (generalized): Secondary | ICD-10-CM | POA: Diagnosis not present

## 2019-09-05 DIAGNOSIS — M65351 Trigger finger, right little finger: Secondary | ICD-10-CM | POA: Diagnosis not present

## 2019-09-07 DIAGNOSIS — M6281 Muscle weakness (generalized): Secondary | ICD-10-CM | POA: Diagnosis not present

## 2019-09-07 DIAGNOSIS — M25541 Pain in joints of right hand: Secondary | ICD-10-CM | POA: Diagnosis not present

## 2019-09-07 DIAGNOSIS — M25641 Stiffness of right hand, not elsewhere classified: Secondary | ICD-10-CM | POA: Diagnosis not present

## 2019-09-07 DIAGNOSIS — M65351 Trigger finger, right little finger: Secondary | ICD-10-CM | POA: Diagnosis not present

## 2019-09-14 DIAGNOSIS — M25541 Pain in joints of right hand: Secondary | ICD-10-CM | POA: Diagnosis not present

## 2019-09-14 DIAGNOSIS — M6281 Muscle weakness (generalized): Secondary | ICD-10-CM | POA: Diagnosis not present

## 2019-09-14 DIAGNOSIS — M65351 Trigger finger, right little finger: Secondary | ICD-10-CM | POA: Diagnosis not present

## 2019-09-14 DIAGNOSIS — M25641 Stiffness of right hand, not elsewhere classified: Secondary | ICD-10-CM | POA: Diagnosis not present

## 2019-09-17 ENCOUNTER — Other Ambulatory Visit: Payer: Self-pay | Admitting: Family Medicine

## 2019-09-19 DIAGNOSIS — M65351 Trigger finger, right little finger: Secondary | ICD-10-CM | POA: Diagnosis not present

## 2019-09-19 DIAGNOSIS — M25541 Pain in joints of right hand: Secondary | ICD-10-CM | POA: Diagnosis not present

## 2019-09-19 DIAGNOSIS — M6281 Muscle weakness (generalized): Secondary | ICD-10-CM | POA: Diagnosis not present

## 2019-09-19 DIAGNOSIS — M25641 Stiffness of right hand, not elsewhere classified: Secondary | ICD-10-CM | POA: Diagnosis not present

## 2019-09-19 NOTE — Telephone Encounter (Signed)
Ov 10/21/19

## 2019-10-18 ENCOUNTER — Other Ambulatory Visit (INDEPENDENT_AMBULATORY_CARE_PROVIDER_SITE_OTHER): Payer: Self-pay | Admitting: Internal Medicine

## 2019-10-21 ENCOUNTER — Ambulatory Visit: Payer: Medicare Other | Admitting: Family Medicine

## 2019-10-21 ENCOUNTER — Other Ambulatory Visit: Payer: Self-pay

## 2019-10-24 ENCOUNTER — Other Ambulatory Visit: Payer: Self-pay

## 2019-10-24 ENCOUNTER — Encounter: Payer: Self-pay | Admitting: Family Medicine

## 2019-10-24 ENCOUNTER — Ambulatory Visit (INDEPENDENT_AMBULATORY_CARE_PROVIDER_SITE_OTHER): Payer: Medicare Other | Admitting: Family Medicine

## 2019-10-24 VITALS — BP 143/75 | HR 56 | Temp 97.5°F | Resp 20 | Ht 59.0 in | Wt 193.0 lb

## 2019-10-24 DIAGNOSIS — E1169 Type 2 diabetes mellitus with other specified complication: Secondary | ICD-10-CM | POA: Diagnosis not present

## 2019-10-24 DIAGNOSIS — E119 Type 2 diabetes mellitus without complications: Secondary | ICD-10-CM

## 2019-10-24 DIAGNOSIS — I152 Hypertension secondary to endocrine disorders: Secondary | ICD-10-CM

## 2019-10-24 DIAGNOSIS — I10 Essential (primary) hypertension: Secondary | ICD-10-CM

## 2019-10-24 DIAGNOSIS — E039 Hypothyroidism, unspecified: Secondary | ICD-10-CM | POA: Diagnosis not present

## 2019-10-24 DIAGNOSIS — K219 Gastro-esophageal reflux disease without esophagitis: Secondary | ICD-10-CM

## 2019-10-24 DIAGNOSIS — E1159 Type 2 diabetes mellitus with other circulatory complications: Secondary | ICD-10-CM

## 2019-10-24 DIAGNOSIS — E785 Hyperlipidemia, unspecified: Secondary | ICD-10-CM

## 2019-10-24 LAB — BAYER DCA HB A1C WAIVED: HB A1C (BAYER DCA - WAIVED): 6.5 % (ref <7.0)

## 2019-10-24 MED ORDER — LISINOPRIL 10 MG PO TABS: 5.0000 mg | ORAL_TABLET | Freq: Every day | ORAL | 1 refills | Status: DC

## 2019-10-24 NOTE — Patient Instructions (Signed)

## 2019-10-24 NOTE — Progress Notes (Addendum)
Subjective:  Patient ID: Cynthia Mays, female    DOB: 1941-08-18, 79 y.o.   MRN: [id number]  Patient Care Team: Baruch Gouty, FNP as PCP - General (Family Medicine)   Chief Complaint:  Medical Management of Chronic Issues (3 mo ), Hyperlipidemia, Hypertension, Hypothyroidism, and Diabetes   HPI: Cynthia Mays is a 79 y.o. female presenting on 10/24/2019 for Medical Management of Chronic Issues (3 mo ), Hyperlipidemia, Hypertension, Hypothyroidism, and Diabetes   1. Type 2 diabetes mellitus without complication, without long-term current use of insulin (HCC) A1c 6.5 today. Endorses diet high in lean meats and leafy green vegetables, low in carbs and sugars. Does not check blood glucose at home regularly. Denies any feelings of high or low blood sugars. Denies parasthesias in feet, polyuria, polydipsia, weight gain, nausea or vomiting. Does do regular foot exams.    2. Hypertension associated with diabetes ([place]) BP mildly elevated in office today at 147/76, patient did not take meds prior to coming in. Checks BP at home with readings mostly in the 130s/70s. She does report some low readings in the 90s/40s in the early afternoon. She reports feeling lightheaded and fatigued when episodes of hypotension occur, they are usually resolved with rest. She reports episodes happen a few times per month.    3. Acquired hypothyroidism Patient is compliant with medications-euthryox, she denies any excessive fatigue, weight gain or weigh loss, bowel changes, heat or cold intolerance, mood changes, sleep changes, hair skin or nail changes, palpitations, edema or shortness of breath.   4. Hyperlipidemia associated with type 2 diabetes mellitus ([place]) Patient is compliant with medications, denies any associated myalgias or other side effects. Eats diet high in lean protein and grean leafy vegetables, does not exercise regularly.   5. Gastroesophageal reflux disease without  esophagitis Currently on pantoprazole and famotidine daily with good result, using carafate PRN. Denies any associated side effects. Is followed by GI, but unsure of her next appointment.      Relevant past medical, surgical, family, and social history reviewed and updated as indicated.  Allergies and medications reviewed and updated. Date reviewed: Chart in Epic.   Past Medical History:  Diagnosis Date  . Complication of anesthesia   . Depression   . Diabetes mellitus   . Fibromyalgia   . GERD (gastroesophageal reflux disease)   . Hiatal hernia   . Hyperlipidemia   . Hypertension   . Hypothyroidism   . Hypothyroidism 07/23/2016  . PONV (postoperative nausea and vomiting)     Past Surgical History:  Procedure Laterality Date  . APPENDECTOMY  2002  . arthroscopy  right 2002, left 2007   bilateral knees  . BACK SURGERY  10/2007, 09/2008   x2  . BIOPSY  10/02/2016   Procedure: BIOPSY;  Surgeon: Rogene Houston, MD;  Location: [facility];  Service: Endoscopy;;  gastric  . [place]   bilateral  . [place]   x2  . ESOPHAGOGASTRODUODENOSCOPY N/A 10/02/2016   Procedure: ESOPHAGOGASTRODUODENOSCOPY (EGD);  Surgeon: Rogene Houston, MD;  Location: [facility];  Service: Endoscopy;  Laterality: N/A;  11:15 - moved to 2/1 @ 3:00  . KNEE SURGERY     both    Social History   Socioeconomic History  . Marital status: Widowed    Spouse name: Not on file  . Number of children: 4  . Years of education: Not on file  . Highest education level:  Not on file  Occupational History    Employer: RETIRED  Tobacco Use  . Smoking status: Former Smoker    Packs/day: 1.00    Years: 43.00    Pack years: 43.00    Types: Cigarettes  . Smokeless tobacco: Never Used  . Tobacco comment: > 20 years quit  Substance and Sexual Activity  . Alcohol use: No    Comment: occasionally wine  . Drug use: No  . Sexual activity: Yes    Birth control/protection:  Post-menopausal  Other Topics Concern  . Not on file  Social History Narrative   Lives alone.  Widowed    Social Determinants of Radio broadcast assistant Strain:   . Difficulty of Paying Living Expenses: Not on file  Food Insecurity:   . Worried About Charity fundraiser in the Last Year: Not on file  . Ran Out of Food in the Last Year: Not on file  Transportation Needs:   . Lack of Transportation (Medical): Not on file  . Lack of Transportation (Non-Medical): Not on file  Physical Activity:   . Days of Exercise per Week: Not on file  . Minutes of Exercise per Session: Not on file  Stress:   . Feeling of Stress : Not on file  Social Connections:   . Frequency of Communication with Friends and Family: Not on file  . Frequency of Social Gatherings with Friends and Family: Not on file  . Attends Religious Services: Not on file  . Active Member of Clubs or Organizations: Not on file  . Attends Archivist Meetings: Not on file  . Marital Status: Not on file  Intimate Partner Violence:   . Fear of Current or Ex-Partner: Not on file  . Emotionally Abused: Not on file  . Physically Abused: Not on file  . Sexually Abused: Not on file    Outpatient Encounter Medications as of 10/24/2019  Medication Sig  . clobetasol cream (TEMOVATE) 2.59 % Apply 1 application topically daily as needed (rash).  . FAMOTIDINE PO Take 20 mg by mouth at bedtime.  . fexofenadine (ALLEGRA) 180 MG tablet Take 180 mg by mouth daily.  . fluticasone (FLONASE) 50 MCG/ACT nasal spray Place 2 sprays into the nose daily as needed for allergies.   . hydrochlorothiazide (HYDRODIURIL) 25 MG tablet Take 1 tablet (25 mg total) by mouth daily.  Marland Kitchen HYDROcodone-acetaminophen (NORCO/VICODIN) 5-325 MG tablet Take 1 tablet by mouth every 6 (six) hours as needed for moderate pain.  Marland Kitchen levothyroxine (EUTHYROX) 112 MCG tablet TAKE 1 TABLET BY MOUTH ONCE DAILY BEFORE BREAKFAST  . lisinopril (ZESTRIL) 10 MG tablet Take  0.5 tablets (5 mg total) by mouth daily.  . metFORMIN (GLUCOPHAGE) 500 MG tablet Take 1 tablet (500 mg total) by mouth 2 (two) times daily.  . Multiple Vitamins-Minerals (MULTIVITAMINS THER. W/MINERALS) TABS Take 1 tablet by mouth daily.    . naproxen sodium (ALEVE) 220 MG tablet Take 220 mg by mouth daily as needed.  Marland Kitchen NIFEdipine (PROCARDIA XL/NIFEDICAL XL) 60 MG 24 hr tablet Take 1 tablet (60 mg total) by mouth daily.  . pantoprazole (PROTONIX) 40 MG tablet TAKE 1 TABLET BY MOUTH TWICE DAILY BEFORE A MEAL  . rosuvastatin (CRESTOR) 5 MG tablet Take 1 tablet (5 mg total) by mouth daily.  . sertraline (ZOLOFT) 50 MG tablet TAKE 1 & 1/2 (ONE & ONE-HALF) TABLETS BY MOUTH ONCE DAILY  . [DISCONTINUED] lisinopril (ZESTRIL) 10 MG tablet Take 1 tablet (10 mg total) by  mouth daily.  . sucralfate (CARAFATE) 1 GM/10ML suspension TAKE 10 ML BY MOUTH 4 TIMES DAILY WITH MEALS AND AT BEDTIME   No facility-administered encounter medications on file as of 10/24/2019.    Allergies  Allergen Reactions  . Codeine Other (See Comments)    Headache, gi upset  . Penicillins Rash    Has patient had a PCN reaction causing immediate rash, facial/tongue/throat swelling, SOB or lightheadedness with hypotension: Yes Has patient had a PCN reaction causing severe rash involving mucus membranes or skin necrosis: No Has patient had a PCN reaction that required hospitalization No Has patient had a PCN reaction occurring within the last 10 years: No If all of the above answers are "NO", then may proceed with Cephalosporin use.   . Pravastatin     Leg cramps    Review of Systems  Constitutional: Negative.  Negative for activity change, appetite change and fatigue.  HENT: Negative.   Eyes: Negative.   Respiratory: Negative.  Negative for shortness of breath.   Cardiovascular: Negative.  Negative for chest pain and palpitations.  Gastrointestinal: Negative.  Negative for abdominal pain, constipation and diarrhea.   Endocrine: Negative.  Negative for cold intolerance and heat intolerance.  Genitourinary: Negative for dysuria and frequency.  Musculoskeletal: Negative.  Negative for arthralgias and myalgias.  Skin: Negative.  Negative for color change.  Allergic/Immunologic: Positive for environmental allergies.  Neurological: Negative for dizziness and numbness.  Hematological: Negative.   Psychiatric/Behavioral: Negative.  Negative for behavioral problems, confusion, hallucinations and sleep disturbance.  All other systems reviewed and are negative.       Objective:  BP (!) 143/75 (BP Location: Left Arm, Cuff Size: Large)   Pulse (!) 56   Temp (!) 97.5 F (36.4 C)   Resp 20   Ht '4\' 11"'$  (1.499 m)   Wt 193 lb (87.5 kg)   SpO2 99%   BMI 38.98 kg/m    Wt Readings from Last 3 Encounters:  10/24/19 193 lb (87.5 kg)  08/03/19 189 lb 14.4 oz (86.1 kg)  07/21/19 192 lb (87.1 kg)    Physical Exam Vitals and nursing note reviewed.  Constitutional:      Appearance: Normal appearance. She is normal weight.  HENT:     Head: Normocephalic and atraumatic.     Nose: Nose normal.     Mouth/Throat:     Mouth: Mucous membranes are moist.  Eyes:     Pupils: Pupils are equal, round, and reactive to light.  Cardiovascular:     Rate and Rhythm: Normal rate and regular rhythm.     Pulses: Normal pulses.     Heart sounds: Normal heart sounds.  Pulmonary:     Effort: Pulmonary effort is normal.     Breath sounds: Normal breath sounds.  Abdominal:     General: Abdomen is flat.     Palpations: Abdomen is soft.  Musculoskeletal:        General: Normal range of motion.     Cervical back: Normal range of motion and neck supple.  Skin:    General: Skin is warm and dry.     Capillary Refill: Capillary refill takes less than 2 seconds.  Neurological:     General: No focal deficit present.     Mental Status: She is alert and oriented to person, place, and time. Mental status is at baseline.   Psychiatric:        Mood and Affect: Mood normal.  Behavior: Behavior normal.        Thought Content: Thought content normal.        Judgment: Judgment normal.     Results for orders placed or performed in visit on 08/03/19  Gastrointestinal Pathogen Panel PCR  Result Value Ref Range   Campylobacter, PCR NOT DETECTED NOT DETECT   C. difficile Tox A/B, PCR NOT DETECTED NOT DETECT   E coli 0157, PCR NOT DETECTED NOT DETECT   E coli (ETEC) LT/ST PCR NOT DETECTED NOT DETECT   E coli (STEC) stx1/stx2, PCR NOT DETECTED NOT DETECT   Salmonella, PCR NOT DETECTED NOT DETECT   Shigella, PCR NOT DETECTED NOT DETECT   Norovirus, PCR NOT DETECTED NOT DETECT   Rotavirus A, PCR NOT DETECTED NOT DETECT   Giardia lamblia, PCR NOT DETECTED NOT DETECT   Cryptosporidium, PCR NOT DETECTED NOT DETECT  CBC with Differential  Result Value Ref Range   WBC 6.3 3.8 - 10.8 Thousand/uL   RBC 4.60 3.80 - 5.10 Million/uL   Hemoglobin 13.3 11.7 - 15.5 g/dL   HCT 39.6 35.0 - 45.0 %   MCV 86.1 80.0 - 100.0 fL   MCH 28.9 27.0 - 33.0 pg   MCHC 33.6 32.0 - 36.0 g/dL   RDW 13.1 11.0 - 15.0 %   Platelets 222 140 - 400 Thousand/uL   MPV 10.1 7.5 - 12.5 fL   Neutro Abs 4,164 1,500 - 7,800 cells/uL   Lymphs Abs 1,518 850 - 3,900 cells/uL   Absolute Monocytes 441 200 - 950 cells/uL   Eosinophils Absolute 139 15 - 500 cells/uL   Basophils Absolute 38 0 - 200 cells/uL   Neutrophils Relative % 66.1 %   Total Lymphocyte 24.1 %   Monocytes Relative 7.0 %   Eosinophils Relative 2.2 %   Basophils Relative 0.6 %  COMPLETE METABOLIC PANEL WITH GFR  Result Value Ref Range   Glucose, Bld 106 65 - 139 mg/dL   BUN 22 7 - 25 mg/dL   Creat 1.07 (H) 0.60 - 0.93 mg/dL   GFR, Est Non African American 50 (L) > OR = 60 mL/min/1.13m   GFR, Est African American 58 (L) > OR = 60 mL/min/1.763m  BUN/Creatinine Ratio 21 6 - 22 (calc)   Sodium 141 135 - 146 mmol/L   Potassium 3.9 3.5 - 5.3 mmol/L   Chloride 105 98 - 110  mmol/L   CO2 26 20 - 32 mmol/L   Calcium 9.3 8.6 - 10.4 mg/dL   Total Protein 6.9 6.1 - 8.1 g/dL   Albumin 4.0 3.6 - 5.1 g/dL   Globulin 2.9 1.9 - 3.7 g/dL (calc)   AG Ratio 1.4 1.0 - 2.5 (calc)   Total Bilirubin 0.4 0.2 - 1.2 mg/dL   Alkaline phosphatase (APISO) 81 37 - 153 U/L   AST 18 10 - 35 U/L   ALT 10 6 - 29 U/L       Pertinent labs & imaging results that were available during my care of the patient were reviewed by me and considered in my medical decision making.  Assessment & Plan:  ChHiedias seen today for medical management of chronic issues, hyperlipidemia, hypertension, hypothyroidism and diabetes.  Diagnoses and all orders for this visit:  Type 2 diabetes mellitus without complication, without long-term current use of insulin (HCC) A1C 6.5 today.  Stable, no changes made in regimen. Follow up in 3 months.  -     CBC with Differential/Platelet -  Bayer DCA Hb A1c Waived  Hypertension associated with diabetes ([place]) Having episodes of low BP, 90/40's at home with dizziness. Will decrease Lisinopril to 5 mg daily. Pt aware to report any persistent high or low readings, new or worsening symptoms.  -     CBC with Differential/Platelet -     CMP14+EGFR -     lisinopril (ZESTRIL) 10 MG tablet; Take 0.5 tablets (5 mg total) by mouth daily.  Acquired hypothyroidism Thyroid disease has been well controlled. Labs are pending. Adjustments to regimen will be made if warranted. Make sure to take medications on an empty stomach with a full glass of water. Make sure to avoid vitamins or supplements for at least 4 hours before and 4 hours after taking medications. Repeat labs in 3 months if adjustments are made and in 6 months if stable.   -     CBC with Differential/Platelet  Hyperlipidemia associated with type 2 diabetes mellitus ([place]) Diet encouraged - increase intake of fresh fruits and vegetables, increase intake of lean proteins. Bake, broil, or grill foods. Avoid  fried, greasy, and fatty foods. Avoid fast foods. Increase intake of fiber-rich whole grains. Exercise encouraged - at least 150 minutes per week and advance as tolerated.  Goal BMI < 25. Continue medications as prescribed. Follow up in 3-6 months as discussed.  -     CBC with Differential/Platelet -     Lipid panel  Gastroesophageal reflux disease without esophagitis No red flags present. Diet discussed. Avoid fried, spicy, fatty, greasy, and acidic foods. Avoid caffeine, nicotine, and alcohol. Do not eat 2-3 hours before bedtime and stay upright for at least 1-2 hours after eating. Eat small frequent meals. Avoid NSAID's like motrin and aleve. Medications as prescribed. Report any new or worsening symptoms. Follow up as discussed or sooner if needed.   -     CBC with Differential/Platelet     Continue all other maintenance medications.  Follow up plan: Return in about 3 months (around 01/21/2020), or if symptoms worsen or fail to improve, for DM.  Continue healthy lifestyle choices, including diet (rich in fruits, vegetables, and lean proteins, and low in salt and simple carbohydrates) and exercise (at least 30 minutes of moderate physical activity daily).  Educational handout given for DM.  The above assessment and management plan was discussed with the patient. The patient verbalized understanding of and has agreed to the management plan. Patient is aware to call the clinic if they develop any new symptoms or if symptoms persist or worsen. Patient is aware when to return to the clinic for a follow-up visit. Patient educated on when it is appropriate to go to the emergency department.   Maureen Ralphs, NP [place] [phone]    I personally was present during the history, physical exam, and medical decision-making activities of this service and have verified that the service and findings are accurately documented in the nurse practitioner student's  note.  Monia Pouch, FNP-C [facility] [address] [place], [place] [address] [phone]

## 2019-10-25 LAB — CBC WITH DIFFERENTIAL/PLATELET
Basophils Absolute: 0.1 10*3/uL (ref 0.0–0.2)
Basos: 1 %
EOS (ABSOLUTE): 0.3 10*3/uL (ref 0.0–0.4)
Eos: 4 %
Hematocrit: 37.9 % (ref 34.0–46.6)
Hemoglobin: 12.5 g/dL (ref 11.1–15.9)
Immature Grans (Abs): 0 10*3/uL (ref 0.0–0.1)
Immature Granulocytes: 0 %
Lymphocytes Absolute: 1.9 10*3/uL (ref 0.7–3.1)
Lymphs: 28 %
MCH: 28.4 pg (ref 26.6–33.0)
MCHC: 33 g/dL (ref 31.5–35.7)
MCV: 86 fL (ref 79–97)
Monocytes Absolute: 0.5 10*3/uL (ref 0.1–0.9)
Monocytes: 7 %
Neutrophils Absolute: 4.1 10*3/uL (ref 1.4–7.0)
Neutrophils: 60 %
Platelets: 218 10*3/uL (ref 150–450)
RBC: 4.4 x10E6/uL (ref 3.77–5.28)
RDW: 13.8 % (ref 11.7–15.4)
WBC: 6.9 10*3/uL (ref 3.4–10.8)

## 2019-10-25 LAB — CMP14+EGFR
ALT: 15 IU/L (ref 0–32)
AST: 22 IU/L (ref 0–40)
Albumin/Globulin Ratio: 1.3 (ref 1.2–2.2)
Albumin: 3.9 g/dL (ref 3.7–4.7)
Alkaline Phosphatase: 87 IU/L (ref 39–117)
BUN/Creatinine Ratio: 19 (ref 12–28)
BUN: 21 mg/dL (ref 8–27)
Bilirubin Total: 0.2 mg/dL (ref 0.0–1.2)
CO2: 20 mmol/L (ref 20–29)
Calcium: 9.2 mg/dL (ref 8.7–10.3)
Chloride: 109 mmol/L — ABNORMAL HIGH (ref 96–106)
Creatinine, Ser: 1.08 mg/dL — ABNORMAL HIGH (ref 0.57–1.00)
GFR calc Af Amer: 57 mL/min/{1.73_m2} — ABNORMAL LOW (ref >59)
GFR calc non Af Amer: 50 mL/min/{1.73_m2} — ABNORMAL LOW (ref >59)
Globulin, Total: 2.9 g/dL (ref 1.5–4.5)
Glucose: 106 mg/dL — ABNORMAL HIGH (ref 65–99)
Potassium: 4.5 mmol/L (ref 3.5–5.2)
Sodium: 143 mmol/L (ref 134–144)
Total Protein: 6.8 g/dL (ref 6.0–8.5)

## 2019-10-25 LAB — LIPID PANEL
Chol/HDL Ratio: 3.2 ratio (ref 0.0–4.4)
Cholesterol, Total: 155 mg/dL (ref 100–199)
HDL: 48 mg/dL (ref >39)
LDL Chol Calc (NIH): 82 mg/dL (ref 0–99)
Triglycerides: 146 mg/dL (ref 0–149)
VLDL Cholesterol Cal: 25 mg/dL (ref 5–40)

## 2019-10-27 ENCOUNTER — Other Ambulatory Visit (INDEPENDENT_AMBULATORY_CARE_PROVIDER_SITE_OTHER): Payer: Self-pay | Admitting: Nurse Practitioner

## 2019-10-27 NOTE — Telephone Encounter (Signed)
HI Tammy, Pls acilitate rx request with Dr. Karilyn Cota. Thx

## 2019-11-11 ENCOUNTER — Telehealth: Payer: Self-pay | Admitting: Physician Assistant

## 2019-11-11 NOTE — Telephone Encounter (Signed)
What labs would you like for this patient?

## 2019-11-14 ENCOUNTER — Other Ambulatory Visit: Payer: Self-pay | Admitting: Physician Assistant

## 2019-11-14 DIAGNOSIS — E119 Type 2 diabetes mellitus without complications: Secondary | ICD-10-CM

## 2019-11-14 DIAGNOSIS — E039 Hypothyroidism, unspecified: Secondary | ICD-10-CM

## 2019-11-14 DIAGNOSIS — E1159 Type 2 diabetes mellitus with other circulatory complications: Secondary | ICD-10-CM

## 2019-11-14 DIAGNOSIS — I152 Hypertension secondary to endocrine disorders: Secondary | ICD-10-CM

## 2019-11-14 NOTE — Telephone Encounter (Signed)
Orders have been placed.

## 2019-11-29 ENCOUNTER — Ambulatory Visit (INDEPENDENT_AMBULATORY_CARE_PROVIDER_SITE_OTHER): Payer: Medicare Other | Admitting: *Deleted

## 2019-11-29 DIAGNOSIS — Z0000 Encounter for general adult medical examination without abnormal findings: Secondary | ICD-10-CM

## 2019-11-29 NOTE — Progress Notes (Signed)
MEDICARE ANNUAL WELLNESS VISIT  11/29/2019  Telephone Visit Disclaimer This Medicare AWV was conducted by telephone due to national recommendations for restrictions regarding the COVID-19 Pandemic (e.g. social distancing).  I verified, using two identifiers, that I am speaking with Cynthia Mays or their authorized healthcare agent. I discussed the limitations, risks, security, and privacy concerns of performing an evaluation and management service by telephone and the potential availability of an in-person appointment in the future. The patient expressed understanding and agreed to proceed.   Subjective:  Cynthia Mays is a 79 y.o. female patient of Rakes, Doralee Albino, FNP who had a Medicare Annual Wellness Visit today via telephone. Cynthia Mays is retired and lives alone.  She has 2 daughters, 1 of them lives locally. She reports that she is socially active and does interact with friends/family regularly. She attends church. She has one dog and two cats.  She is minimally physically active and enjoys gardening.  Patient Care Team: Sonny Masters, FNP as PCP - General (Family Medicine)  Advanced Directives 11/29/2019 12/18/2017 10/02/2016 04/04/2016 01/18/2016 07/11/2011  Does Patient Have a Medical Advance Directive? No No No No No Patient does not have advance directive;Patient would not like information  Would patient like information on creating a medical advance directive? No - Patient declined - No - Patient declined No - patient declined information No - patient declined information -  Pre-existing out of facility DNR order (yellow form or pink MOST form) - - - - - No    Hospital Utilization Over the Past 12 Months: # of hospitalizations or ER visits: 2 # of surgeries: 0  Review of Systems    Patient reports that her overall health is unchanged compared to last year.  History obtained from the patient.  Patient Reported Readings (BP, Pulse, CBG, Weight, etc) none  Pain  Assessment Pain : 0-10 Pain Score: 3  Pain Type: Chronic pain Pain Location: Shoulder Pain Orientation: Right Pain Descriptors / Indicators: Aching Pain Onset: More than a month ago Pain Frequency: Constant Pain Relieving Factors: aleve Effect of Pain on Daily Activities: none  Pain Relieving Factors: aleve  Current Medications & Allergies (verified) Allergies as of 11/29/2019      Reactions   Codeine Other (See Comments)   Headache, gi upset   Penicillins Rash   Has patient had a PCN reaction causing immediate rash, facial/tongue/throat swelling, SOB or lightheadedness with hypotension: Yes Has patient had a PCN reaction causing severe rash involving mucus membranes or skin necrosis: No Has patient had a PCN reaction that required hospitalization No Has patient had a PCN reaction occurring within the last 10 years: No If all of the above answers are "NO", then may proceed with Cephalosporin use.   Pravastatin    Leg cramps      Medication List       Accurate as of November 29, 2019  9:24 AM. If you have any questions, ask your nurse or doctor.        clobetasol cream 0.05 % Commonly known as: TEMOVATE Apply 1 application topically daily as needed (rash).   FAMOTIDINE PO Take 20 mg by mouth at bedtime.   famotidine 40 MG tablet Commonly known as: PEPCID TAKE 1 TABLET BY MOUTH AT BEDTIME   fexofenadine 180 MG tablet Commonly known as: ALLEGRA Take 180 mg by mouth daily.   fluticasone 50 MCG/ACT nasal spray Commonly known as: FLONASE Place 2 sprays into the nose daily as needed for  allergies.   hydrochlorothiazide 25 MG tablet Commonly known as: HYDRODIURIL Take 1 tablet (25 mg total) by mouth daily.   HYDROcodone-acetaminophen 5-325 MG tablet Commonly known as: NORCO/VICODIN Take 1 tablet by mouth every 6 (six) hours as needed for moderate pain.   levothyroxine 112 MCG tablet Commonly known as: Euthyrox TAKE 1 TABLET BY MOUTH ONCE DAILY BEFORE BREAKFAST     lisinopril 10 MG tablet Commonly known as: ZESTRIL Take 0.5 tablets (5 mg total) by mouth daily.   metFORMIN 500 MG tablet Commonly known as: GLUCOPHAGE Take 1 tablet (500 mg total) by mouth 2 (two) times daily.   multivitamins ther. w/minerals Tabs tablet Take 1 tablet by mouth daily.   naproxen sodium 220 MG tablet Commonly known as: ALEVE Take 220 mg by mouth daily as needed.   NIFEdipine 60 MG 24 hr tablet Commonly known as: PROCARDIA XL/NIFEDICAL XL Take 1 tablet (60 mg total) by mouth daily.   pantoprazole 40 MG tablet Commonly known as: PROTONIX TAKE 1 TABLET BY MOUTH TWICE DAILY BEFORE A MEAL   rosuvastatin 5 MG tablet Commonly known as: CRESTOR Take 1 tablet (5 mg total) by mouth daily.   sertraline 50 MG tablet Commonly known as: ZOLOFT TAKE 1 & 1/2 (ONE & ONE-HALF) TABLETS BY MOUTH ONCE DAILY   sucralfate 1 GM/10ML suspension Commonly known as: CARAFATE TAKE 10 ML BY MOUTH 4 TIMES DAILY WITH MEALS AND AT BEDTIME       History (reviewed): Past Medical History:  Diagnosis Date   Complication of anesthesia    Depression    Diabetes mellitus    Fibromyalgia    GERD (gastroesophageal reflux disease)    Hiatal hernia    Hyperlipidemia    Hypertension    Hypothyroidism    Hypothyroidism 07/23/2016   PONV (postoperative nausea and vomiting)    Past Surgical History:  Procedure Laterality Date   APPENDECTOMY  2002   arthroscopy  right 2002, left 2007   bilateral knees   BACK SURGERY  10/2007, 09/2008   x2   BIOPSY  10/02/2016   Procedure: BIOPSY;  Surgeon: Malissa Hippo, MD;  Location: [facility];  Service: Endoscopy;;  gastric   CARPAL TUNNEL RELEASE  1986   bilateral   CESAREAN SECTION  1969, 1967   x2   ESOPHAGOGASTRODUODENOSCOPY N/A 10/02/2016   Procedure: ESOPHAGOGASTRODUODENOSCOPY (EGD);  Surgeon: Malissa Hippo, MD;  Location: [facility];  Service: Endoscopy;  Laterality: N/A;  11:15 - moved to 2/1 @ 3:00   KNEE  SURGERY     both   Family History  Problem Relation Age of Onset   Anesthesia problems Mother    CAD Mother 79   Cancer Mother        Pancreatic cancer   Hypertension Father    Suicidality Father        gunshot   Deafness Sister    Heart disease Brother    Suicidality Son    Cancer Sister        female    Suicidality Brother    Hypotension Neg Hx    Pseudochol deficiency Neg Hx    Malignant hyperthermia Neg Hx    Social History   Socioeconomic History   Marital status: Widowed    Spouse name: Not on file   Number of children: 4   Years of education: 56   Highest education level: 12th grade  Occupational History    Employer: RETIRED  Tobacco Use   Smoking status:  Former Smoker    Packs/day: 1.00    Years: 43.00    Pack years: 43.00    Types: Cigarettes   Smokeless tobacco: Never Used   Tobacco comment: > 20 years quit  Substance and Sexual Activity   Alcohol use: No    Comment: occasionally wine   Drug use: No   Sexual activity: Yes    Birth control/protection: Post-menopausal  Other Topics Concern   Not on file  Social History Narrative   Lives alone.  Widowed    Social Determinants of Radio broadcast assistant Strain:    Difficulty of Paying Living Expenses:   Food Insecurity:    Worried About Charity fundraiser in the Last Year:    Arboriculturist in the Last Year:   Transportation Needs:    Film/video editor (Medical):    Lack of Transportation (Non-Medical):   Physical Activity:    Days of Exercise per Week:    Minutes of Exercise per Session:   Stress:    Feeling of Stress :   Social Connections:    Frequency of Communication with Friends and Family:    Frequency of Social Gatherings with Friends and Family:    Attends Religious Services:    Active Member of Clubs or Organizations:    Attends Archivist Meetings:    Marital Status:     Activities of Daily Living In your present  state of health, do you have any difficulty performing the following activities: 11/29/2019  Hearing? N  Vision? N  Difficulty concentrating or making decisions? N  Walking or climbing stairs? N  Dressing or bathing? N  Doing errands, shopping? N  Preparing Food and eating ? N  Using the Toilet? N  In the past six months, have you accidently leaked urine? Y  Comment depends- urinary urgency  Do you have problems with loss of bowel control? N  Managing your Medications? N  Managing your Finances? N  Housekeeping or managing your Housekeeping? N  Some recent data might be hidden    Patient Education/ Literacy How often do you need to have someone help you when you read instructions, pamphlets, or other written materials from your doctor or pharmacy?: 1 - Never What is the last grade level you completed in school?: 12  Exercise Current Exercise Habits: The patient does not participate in regular exercise at present, Exercise limited by: None identified  Diet Patient reports consuming 3 meals a day and 1 snack(s) a day Patient reports that her primary diet is: Regular Patient reports that she does have regular access to food.   Depression Screen PHQ 2/9 Scores 11/29/2019 10/24/2019 07/21/2019 03/31/2019  PHQ - 2 Score 1 0 0 0  PHQ- 9 Score 5 - - -     Fall Risk Fall Risk  11/29/2019 10/24/2019 07/21/2019 03/31/2019  Falls in the past year? 1 0 1 0  Number falls in past yr: 1 - 0 -  Injury with Fall? 0 - 0 -  Risk for fall due to : History of fall(s) - - -  Follow up Falls prevention discussed - Falls prevention discussed -     Objective:  Ala Dach seemed alert and oriented and she participated appropriately during our telephone visit.  Blood Pressure Weight BMI  BP Readings from Last 3 Encounters:  10/24/19 (!) 143/75  08/03/19 102/61  07/21/19 139/83   Wt Readings from Last 3 Encounters:  10/24/19 193 lb (87.5  kg)  08/03/19 189 lb 14.4 oz (86.1 kg)  07/21/19  192 lb (87.1 kg)   BMI Readings from Last 1 Encounters:  10/24/19 38.98 kg/m    *Unable to obtain current vital signs, weight, and BMI due to telephone visit type  Hearing/Vision   Claris Gowerharlotte did not seem to have difficulty with hearing/understanding during the telephone conversation  Reports that she has had a formal eye exam by an eye care professional within the past year  Reports that she has not had a formal hearing evaluation within the past year *Unable to fully assess hearing and vision during telephone visit type  Cognitive Function: 6CIT Screen 11/29/2019  What Year? 0 points  What month? 0 points  What time? 0 points  Count back from 20 0 points  Months in reverse 2 points  Repeat phrase 4 points  Total Score 6   (Normal:0-7, Significant for Dysfunction: >8)  Normal Cognitive Function Screening: Yes   Immunization & Health Maintenance Record Immunization History  Administered Date(s) Administered   Influenza, High Dose Seasonal PF 05/03/2019   Moderna SARS-COVID-2 Vaccination 09/13/2019, 10/14/2019   Pneumococcal Conjugate-13 10/01/2015   Pneumococcal Polysaccharide-23 10/01/2007   Pneumococcal-Unspecified 06/05/2017   Tdap 07/31/2014    Health Maintenance  Topic Date Due   OPHTHALMOLOGY EXAM  02/18/2020   FOOT EXAM  03/30/2020   HEMOGLOBIN A1C  04/22/2020   TETANUS/TDAP  07/31/2024   INFLUENZA VACCINE  Completed   DEXA SCAN  Completed   PNA vac Low Risk Adult  Completed       Assessment  This is a routine wellness examination for Cynthia Palmsharlotte J Lawn.  Health Maintenance: Due or Overdue There are no preventive care reminders to display for this patient.  Cynthia Palmsharlotte J Marvin does not need a referral for Community Assistance: Care Management:   no Social Work:    no Prescription Assistance:  no Nutrition/Diabetes Education:  no   Plan:  Personalized Goals Goals Addressed            This Visit's Progress    Patient Stated        11/29/2019 AWV Goal: Fall Prevention   Over the next year, patient will decrease their risk for falls by: o Using assistive devices, such as a cane or walker, as needed o Identifying fall risks within their home and correcting them by: - Removing throw rugs - Adding handrails to stairs or ramps - Removing clutter and keeping a clear pathway throughout the home - Increasing light, especially at night - Adding shower handles/bars - Raising toilet seat o Identifying potential personal risk factors for falls: - Medication side effects - Incontinence/urgency - Vestibular dysfunction - Hearing loss - Musculoskeletal disorders - Neurological disorders - Orthostatic hypotension  11/29/2019 AWV Goal: Exercise for General Health   Patient will verbalize understanding of the benefits of increased physical activity:  Exercising regularly is important. It will improve your overall fitness, flexibility, and endurance.  Regular exercise also will improve your overall health. It can help you control your weight, reduce stress, and improve your bone density.  Over the next year, patient will increase physical activity as tolerated with a goal of at least 150 minutes of moderate physical activity per week.   You can tell that you are exercising at a moderate intensity if your heart starts beating faster and you start breathing faster but can still hold a conversation.  Moderate-intensity exercise ideas include:  Walking 1 mile (1.6 km) in about 15 minutes  Biking  Hiking  Golfing  Dancing  Water aerobics  Patient will verbalize understanding of everyday activities that increase physical activity by providing examples like the following: ? Yard work, such as: ? Pushing a Surveyor, mining ? Raking and bagging leaves ? Washing your car ? Pushing a stroller ? Shoveling snow ? Gardening ? Washing windows or floors  Patient will be able to explain general safety guidelines for  exercising:   Before you start a new exercise program, talk with your health care provider.  Do not exercise so much that you hurt yourself, feel dizzy, or get very short of breath.  Wear comfortable clothes and wear shoes with good support.  Drink plenty of water while you exercise to prevent dehydration or heat stroke.  Work out until your breathing and your heartbeat get faster.       Personalized Health Maintenance & Screening Recommendations   Lung Cancer Screening Recommended: no (Low Dose CT Chest recommended if Age 2-80 years, 30 pack-year currently smoking OR have quit w/in past 15 years) Hepatitis C Screening recommended: no HIV Screening recommended: no  Advanced Directives: Written information was not prepared per patient's request.  Referrals & Orders No orders of the defined types were placed in this encounter.   Follow-up Plan  Follow-up with Rakes, Doralee Albino, FNP as planned   I have personally reviewed and noted the following in the patients chart:    Medical and social history  Use of alcohol, tobacco or illicit drugs   Current medications and supplements  Functional ability and status  Nutritional status  Physical activity  Advanced directives  List of other physicians  Hospitalizations, surgeries, and ER visits in previous 12 months  Vitals  Screenings to include cognitive, depression, and falls  Referrals and appointments  In addition, I have reviewed and discussed with Cynthia Mays certain preventive protocols, quality metrics, and best practice recommendations. A written personalized care plan for preventive services as well as general preventive health recommendations is available and can be mailed to the patient at her request.      Adella Hare, LPN [id number]

## 2019-12-13 ENCOUNTER — Other Ambulatory Visit (INDEPENDENT_AMBULATORY_CARE_PROVIDER_SITE_OTHER): Payer: Self-pay | Admitting: Internal Medicine

## 2019-12-13 NOTE — Telephone Encounter (Signed)
Patient will need appointment , she was last seen September 2020.

## 2019-12-20 ENCOUNTER — Other Ambulatory Visit (INDEPENDENT_AMBULATORY_CARE_PROVIDER_SITE_OTHER): Payer: Self-pay | Admitting: Gastroenterology

## 2019-12-22 ENCOUNTER — Telehealth (INDEPENDENT_AMBULATORY_CARE_PROVIDER_SITE_OTHER): Payer: Self-pay | Admitting: Internal Medicine

## 2019-12-22 NOTE — Telephone Encounter (Signed)
Spoke with patient about her 1 yr follow up - she wanted to know if she could start getting a 90 day supply of her medication -

## 2019-12-24 NOTE — Telephone Encounter (Signed)
Next visit is December with Cynthia Mays

## 2019-12-28 ENCOUNTER — Other Ambulatory Visit (INDEPENDENT_AMBULATORY_CARE_PROVIDER_SITE_OTHER): Payer: Self-pay | Admitting: *Deleted

## 2019-12-28 DIAGNOSIS — M25561 Pain in right knee: Secondary | ICD-10-CM | POA: Diagnosis not present

## 2019-12-28 DIAGNOSIS — M170 Bilateral primary osteoarthritis of knee: Secondary | ICD-10-CM | POA: Diagnosis not present

## 2019-12-28 DIAGNOSIS — M1712 Unilateral primary osteoarthritis, left knee: Secondary | ICD-10-CM | POA: Diagnosis not present

## 2019-12-28 DIAGNOSIS — R10826 Epigastric rebound abdominal tenderness: Secondary | ICD-10-CM

## 2019-12-28 DIAGNOSIS — K2101 Gastro-esophageal reflux disease with esophagitis, with bleeding: Secondary | ICD-10-CM

## 2019-12-28 DIAGNOSIS — M25562 Pain in left knee: Secondary | ICD-10-CM | POA: Diagnosis not present

## 2019-12-28 MED ORDER — FAMOTIDINE 40 MG PO TABS: 40.0000 mg | ORAL_TABLET | Freq: Every day | ORAL | 1 refills | Status: DC

## 2019-12-28 MED ORDER — PANTOPRAZOLE SODIUM 40 MG PO TBEC: 40.0000 mg | DELAYED_RELEASE_TABLET | Freq: Two times a day (BID) | ORAL | 2 refills | Status: DC

## 2019-12-28 NOTE — Telephone Encounter (Signed)
Pantoprazole 40 mg - patient takes 1 by mouth twice a day. A 90 day supply with 2 refills was sent in to the patient's pharmacy.  The Famotidine I have sent Dr.Rehman a message. There are two prescriptions for this medication. One is for 20 mg at bedtime , and the other is for 40 mg at bedtime. We need clarification on this prescription and then we will call it in to the pharmacy as a 90 day supply.

## 2019-12-29 DIAGNOSIS — M1711 Unilateral primary osteoarthritis, right knee: Secondary | ICD-10-CM | POA: Diagnosis not present

## 2019-12-29 DIAGNOSIS — M25561 Pain in right knee: Secondary | ICD-10-CM | POA: Diagnosis not present

## 2019-12-29 NOTE — Telephone Encounter (Signed)
Per Dr.Rehman may call in the Famotidine 40 mg Q HS. This has been done.

## 2020-01-04 DIAGNOSIS — M1712 Unilateral primary osteoarthritis, left knee: Secondary | ICD-10-CM | POA: Diagnosis not present

## 2020-01-04 DIAGNOSIS — M25562 Pain in left knee: Secondary | ICD-10-CM | POA: Diagnosis not present

## 2020-01-05 DIAGNOSIS — R269 Unspecified abnormalities of gait and mobility: Secondary | ICD-10-CM | POA: Diagnosis not present

## 2020-01-05 DIAGNOSIS — M1711 Unilateral primary osteoarthritis, right knee: Secondary | ICD-10-CM | POA: Diagnosis not present

## 2020-01-05 DIAGNOSIS — M25562 Pain in left knee: Secondary | ICD-10-CM | POA: Diagnosis not present

## 2020-01-05 DIAGNOSIS — M1712 Unilateral primary osteoarthritis, left knee: Secondary | ICD-10-CM | POA: Diagnosis not present

## 2020-01-05 DIAGNOSIS — M25561 Pain in right knee: Secondary | ICD-10-CM | POA: Diagnosis not present

## 2020-01-11 DIAGNOSIS — M25562 Pain in left knee: Secondary | ICD-10-CM | POA: Diagnosis not present

## 2020-01-11 DIAGNOSIS — M1712 Unilateral primary osteoarthritis, left knee: Secondary | ICD-10-CM | POA: Diagnosis not present

## 2020-01-12 DIAGNOSIS — M1711 Unilateral primary osteoarthritis, right knee: Secondary | ICD-10-CM | POA: Diagnosis not present

## 2020-01-12 DIAGNOSIS — M25561 Pain in right knee: Secondary | ICD-10-CM | POA: Diagnosis not present

## 2020-01-17 ENCOUNTER — Ambulatory Visit: Payer: Medicare Other | Admitting: Physician Assistant

## 2020-01-20 ENCOUNTER — Ambulatory Visit: Payer: Medicare Other | Admitting: Family

## 2020-01-24 ENCOUNTER — Other Ambulatory Visit: Payer: Self-pay | Admitting: *Deleted

## 2020-01-24 ENCOUNTER — Encounter: Payer: Self-pay | Admitting: Family

## 2020-01-24 ENCOUNTER — Telehealth: Payer: Self-pay | Admitting: Family

## 2020-01-24 MED ORDER — NIFEDIPINE ER OSMOTIC RELEASE 60 MG PO TB24: 60.0000 mg | ORAL_TABLET | Freq: Every day | ORAL | 0 refills | Status: DC

## 2020-01-24 MED ORDER — HYDROCHLOROTHIAZIDE 25 MG PO TABS: 25.0000 mg | ORAL_TABLET | Freq: Every day | ORAL | 0 refills | Status: DC

## 2020-01-24 MED ORDER — SERTRALINE HCL 50 MG PO TABS: ORAL_TABLET | ORAL | 0 refills | Status: DC

## 2020-01-24 NOTE — Telephone Encounter (Signed)
  Prescription Request  01/24/2020  What is the name of the medication or equipment? HCTZ and Sertraline  Have you contacted your pharmacy to request a refill? (if applicable) Yes  Which pharmacy would you like this sent to? [facility], [place]  Pt had last check up with Dr Reginia Forts in Feb 2021. Pt has upcoming appt with Christy on 02/03/20 but is completely out of these meds.   Patient notified that their request is being sent to the clinical staff for review and that they should receive a response within 2 business days.

## 2020-01-26 ENCOUNTER — Ambulatory Visit: Payer: Medicare Other | Admitting: Family Medicine

## 2020-02-03 ENCOUNTER — Encounter: Payer: Self-pay | Admitting: Family

## 2020-02-03 ENCOUNTER — Ambulatory Visit (INDEPENDENT_AMBULATORY_CARE_PROVIDER_SITE_OTHER): Payer: Medicare Other | Admitting: Family

## 2020-02-03 ENCOUNTER — Other Ambulatory Visit: Payer: Self-pay

## 2020-02-03 VITALS — BP 104/70 | HR 64 | Temp 97.7°F | Ht 59.0 in | Wt 190.2 lb

## 2020-02-03 DIAGNOSIS — I10 Essential (primary) hypertension: Secondary | ICD-10-CM | POA: Diagnosis not present

## 2020-02-03 DIAGNOSIS — I959 Hypotension, unspecified: Secondary | ICD-10-CM

## 2020-02-03 DIAGNOSIS — E039 Hypothyroidism, unspecified: Secondary | ICD-10-CM | POA: Diagnosis not present

## 2020-02-03 DIAGNOSIS — E785 Hyperlipidemia, unspecified: Secondary | ICD-10-CM

## 2020-02-03 DIAGNOSIS — I2583 Coronary atherosclerosis due to lipid rich plaque: Secondary | ICD-10-CM

## 2020-02-03 DIAGNOSIS — I2510 Atherosclerotic heart disease of native coronary artery without angina pectoris: Secondary | ICD-10-CM

## 2020-02-03 DIAGNOSIS — F411 Generalized anxiety disorder: Secondary | ICD-10-CM

## 2020-02-03 DIAGNOSIS — E119 Type 2 diabetes mellitus without complications: Secondary | ICD-10-CM

## 2020-02-03 DIAGNOSIS — E1159 Type 2 diabetes mellitus with other circulatory complications: Secondary | ICD-10-CM

## 2020-02-03 DIAGNOSIS — I152 Hypertension secondary to endocrine disorders: Secondary | ICD-10-CM

## 2020-02-03 DIAGNOSIS — K219 Gastro-esophageal reflux disease without esophagitis: Secondary | ICD-10-CM

## 2020-02-03 DIAGNOSIS — E1169 Type 2 diabetes mellitus with other specified complication: Secondary | ICD-10-CM

## 2020-02-03 DIAGNOSIS — G8929 Other chronic pain: Secondary | ICD-10-CM

## 2020-02-03 DIAGNOSIS — M5441 Lumbago with sciatica, right side: Secondary | ICD-10-CM

## 2020-02-03 LAB — BAYER DCA HB A1C WAIVED: HB A1C (BAYER DCA - WAIVED): 6.4 % (ref <7.0)

## 2020-02-03 NOTE — Progress Notes (Signed)
Subjective:    Patient ID: Cynthia Mays, female    DOB: 1941-08-20, 79 y.o.   MRN: [id number]  Chief Complaint  Patient presents with  . Medical Management of Chronic Issues  . Diabetes  . Hypertension  . Hyperlipidemia   Pt presents to the office today to [place] care. Pt states her BP is been dropping 96/50. She reports she has not taken her BP medication this morning.  Hypertension This is a chronic problem. The current episode started more than 1 year ago. The problem has been resolved since onset. The problem is controlled. Associated symptoms include anxiety and malaise/fatigue. Pertinent negatives include no blurred vision, peripheral edema or shortness of breath. Risk factors for coronary artery disease include dyslipidemia, obesity and sedentary lifestyle. The current treatment provides moderate improvement. Hypertensive end-organ damage includes CAD/MI. There is no history of CVA or heart failure. Identifiable causes of hypertension include a thyroid problem.  Diabetes She presents for her follow-up diabetic visit. She has type 2 diabetes mellitus. Her disease course has been stable. Hypoglycemia symptoms include nervousness/anxiousness. Associated symptoms include fatigue. Pertinent negatives for diabetes include no blurred vision, no foot paresthesias and no visual change. Symptoms are stable. Pertinent negatives for diabetic complications include no CVA. Risk factors for coronary artery disease include dyslipidemia, diabetes mellitus, hypertension, sedentary lifestyle and post-menopausal. (Does not check BS at home) Eye exam is current.  Hyperlipidemia This is a chronic problem. The current episode started more than 1 year ago. Exacerbating diseases include hypothyroidism. Pertinent negatives include no shortness of breath. Current antihyperlipidemic treatment includes statins. The current treatment provides moderate improvement of lipids. Risk factors for coronary artery  disease include dyslipidemia, diabetes mellitus, hypertension and a sedentary lifestyle.  Gastroesophageal Reflux She complains of belching and heartburn. This is a chronic problem. The current episode started more than 1 year ago. The problem occurs occasionally. The problem has been waxing and waning. Associated symptoms include fatigue. She has tried a PPI for the symptoms. The treatment provided moderate relief.  Thyroid Problem Presents for follow-up visit. Symptoms include anxiety and fatigue. Patient reports no constipation, depressed mood or visual change. The symptoms have been stable. Her past medical history is significant for hyperlipidemia. There is no history of heart failure.  Anxiety Presents for follow-up visit. Symptoms include excessive worry and nervous/anxious behavior. Patient reports no depressed mood or shortness of breath. Symptoms occur occasionally.        Review of Systems  Constitutional: Positive for fatigue and malaise/fatigue.  Eyes: Negative for blurred vision.  Respiratory: Negative for shortness of breath.   Gastrointestinal: Positive for heartburn. Negative for constipation.  Psychiatric/Behavioral: The patient is nervous/anxious.   All other systems reviewed and are negative.      Objective:   Physical Exam Vitals reviewed.  Constitutional:      General: She is not in acute distress.    Appearance: She is well-developed.  HENT:     Head: Normocephalic and atraumatic.     Right Ear: Tympanic membrane normal.     Left Ear: Tympanic membrane normal.  Eyes:     Pupils: Pupils are equal, round, and reactive to light.  Neck:     Thyroid: No thyromegaly.  Cardiovascular:     Rate and Rhythm: Normal rate and regular rhythm.     Heart sounds: Normal heart sounds. No murmur.  Pulmonary:     Effort: Pulmonary effort is normal. No respiratory distress.     Breath sounds: Normal breath sounds.  No wheezing.  Abdominal:     General: Bowel sounds are  normal. There is no distension.     Palpations: Abdomen is soft.     Tenderness: There is no abdominal tenderness.  Musculoskeletal:        General: No tenderness. Normal range of motion.     Cervical back: Normal range of motion and neck supple.  Skin:    General: Skin is warm and dry.  Neurological:     Mental Status: She is alert and oriented to person, place, and time.     Cranial Nerves: No cranial nerve deficit.     Deep Tendon Reflexes: Reflexes are normal and symmetric.  Psychiatric:        Behavior: Behavior normal.        Thought Content: Thought content normal.        Judgment: Judgment normal.       BP 104/70   Pulse 64   Temp 97.7 F (36.5 C)   Ht '4\' 11"'$  (1.499 m)   Wt 190 lb 3.2 oz (86.3 kg)   SpO2 98%   BMI 38.42 kg/m      Assessment & Plan:  Cynthia Mays comes in today with chief complaint of Medical Management of Chronic Issues, Diabetes, Hypertension, and Hyperlipidemia   Diagnosis and orders addressed:  1. Hypertension associated with diabetes ([place]) -Will stop HCTZ 25 mg related to hypotension - CMP14+EGFR - CBC with Differential/Platelet  2. Gastroesophageal reflux disease without esophagitis - CMP14+EGFR - CBC with Differential/Platelet  3. Coronary artery disease due to lipid rich plaque - CMP14+EGFR - CBC with Differential/Platelet  4. Hyperlipidemia associated with type 2 diabetes mellitus ([facility]) - CMP14+EGFR - CBC with Differential/Platelet  5. Acquired hypothyroidism - CMP14+EGFR - CBC with Differential/Platelet - TSH  6. Type 2 diabetes mellitus without complication, without long-term current use of insulin ([facility]) - CMP14+EGFR - CBC with Differential/Platelet - Microalbumin / creatinine urine ratio  7. Chronic midline low back pain with right-sided sciatica - CMP14+EGFR - CBC with Differential/Platelet  8. Morbid obesity ([place])  - CMP14+EGFR - CBC with Differential/Platelet  9. Anxiety, generalized - CMP14+EGFR  - CBC with Differential/Platelet  10. Hypotension, unspecified hypotension type   Labs pending Health Maintenance reviewed Diet and exercise encouraged  Follow up plan: 4 weeks to recheck BP after stopping HCTZ  Evelina Dun, FNP

## 2020-02-03 NOTE — Patient Instructions (Signed)
Hypotension As your heart beats, it forces blood through your body. Hypotension, commonly called low blood pressure, is when the force of blood pumping through your arteries is too weak. Arteries are blood vessels that carry blood from the heart throughout the body. Depending on the cause and severity, hypotension may be harmless (benign) or may cause serious problems (be critical). When blood pressure is too low, you may not get enough blood to your brain or to the rest of your organs. This can cause weakness, light-headedness, rapid heartbeat, and fainting. What are the causes? This condition may be caused by:  Blood loss.  Loss of body fluids (dehydration).  Heart problems.  Hormone (endocrine) problems.  Pregnancy.  Severe infection.  Lack of certain nutrients.  Severe allergic reactions (anaphylaxis).  Certain medicines, such as blood pressure medicine or medicines that make the body lose excess fluids (diuretics). Sometimes, hypotension may be caused by not taking medicine as directed, such as taking too much of a certain medicine. What increases the risk? The following factors may make you more likely to develop this condition:  Age. Risk increases as you get older.  Conditions that affect the heart or the central nervous system.  Taking certain medicines, such as blood pressure medicine or diuretics.  Being pregnant. What are the signs or symptoms? Common symptoms of this condition include:  Weakness.  Light-headedness.  Dizziness.  Blurred vision.  Fatigue.  Rapid heartbeat.  Fainting, in severe cases. How is this diagnosed? This condition is diagnosed based on:  Your medical history.  Your symptoms.  Your blood pressure measurement. Your health care provider will check your blood pressure when you are: ? Lying down. ? Sitting. ? Standing. A blood pressure reading is recorded as two numbers, such as "120 over 80" (or 120/80). The first ("top")  number is called the systolic pressure. It is a measure of the pressure in your arteries as your heart beats. The second ("bottom") number is called the diastolic pressure. It is a measure of the pressure in your arteries when your heart relaxes between beats. Blood pressure is measured in a unit called mm Hg. Healthy blood pressure for most adults is 120/80. If your blood pressure is below 90/60, you may be diagnosed with hypotension. Other information or tests that may be used to diagnose hypotension include:  Your other vital signs, such as your heart rate and temperature.  Blood tests.  Tilt table test. For this test, you will be safely secured to a table that moves you from a lying position to an upright position. Your heart rhythm and blood pressure will be monitored during the test. How is this treated? Treatment for this condition may include:  Changing your diet. This may involve eating more salt (sodium) or drinking more water.  Taking medicines to raise your blood pressure.  Changing the dosage of certain medicines you are taking that might be lowering your blood pressure.  Wearing compression stockings. These stockings help to prevent blood clots and reduce swelling in your legs. In some cases, you may need to go to the hospital for:  Fluid replacement. This means you will receive fluids through an IV.  Blood replacement. This means you will receive donated blood through an IV (transfusion).  Treating an infection or heart problems, if this applies.  Monitoring. You may need to be monitored while medicines that you are taking wear off. Follow these instructions at home: Eating and drinking   Drink enough fluid to keep your   urine pale yellow.  Eat a healthy diet, and follow instructions from your health care provider about eating or drinking restrictions. A healthy diet includes: ? Fresh fruits and vegetables. ? Whole grains. ? Lean meats. ? Low-fat dairy  products.  Eat extra salt only as directed. Do not add extra salt to your diet unless your health care provider told you to do that.  Eat frequent, small meals.  Avoid standing up suddenly after eating. Medicines  Take over-the-counter and prescription medicines only as told by your health care provider. ? Follow instructions from your health care provider about changing the dosage of your current medicines, if this applies. ? Do not stop or adjust any of your medicines on your own. General instructions   Wear compression stockings as told by your health care provider.  Get up slowly from lying down or sitting positions. This gives your blood pressure a chance to adjust.  Avoid hot showers and excessive heat as directed by your health care provider.  Return to your normal activities as told by your health care provider. Ask your health care provider what activities are safe for you.  Do not use any products that contain nicotine or tobacco, such as cigarettes, e-cigarettes, and chewing tobacco. If you need help quitting, ask your health care provider.  Keep all follow-up visits as told by your health care provider. This is important. Contact a health care provider if you:  Vomit.  Have diarrhea.  Have a fever for more than 2-3 days.  Feel more thirsty than usual.  Feel weak and tired. Get help right away if you:  Have chest pain.  Have a fast or irregular heartbeat.  Develop numbness in any part of your body.  Cannot move your arms or your legs.  Have trouble speaking.  Become sweaty or feel light-headed.  Faint.  Feel short of breath.  Have trouble staying awake.  Feel confused. Summary  Hypotension is when the force of blood pumping through your arteries is too weak.  Hypotension may be harmless (benign) or may cause serious problems (be critical).  Treatment for this condition may include changing your diet, changing your medicines, and wearing  compression stockings.  In some cases, you may need to go to the hospital for fluid or blood replacement. This information is not intended to replace advice given to you by your health care provider. Make sure you discuss any questions you have with your health care provider. Document Revised: 02/11/2018 Document Reviewed: 02/11/2018 Elsevier Patient Education  2020 [employer]  

## 2020-02-04 LAB — CMP14+EGFR
ALT: 13 IU/L (ref 0–32)
AST: 17 IU/L (ref 0–40)
Albumin/Globulin Ratio: 1.5 (ref 1.2–2.2)
Albumin: 4 g/dL (ref 3.7–4.7)
Alkaline Phosphatase: 84 IU/L (ref 48–121)
BUN/Creatinine Ratio: 24 (ref 12–28)
BUN: 26 mg/dL (ref 8–27)
Bilirubin Total: 0.4 mg/dL (ref 0.0–1.2)
CO2: 23 mmol/L (ref 20–29)
Calcium: 8.8 mg/dL (ref 8.7–10.3)
Chloride: 104 mmol/L (ref 96–106)
Creatinine, Ser: 1.08 mg/dL — ABNORMAL HIGH (ref 0.57–1.00)
GFR calc Af Amer: 57 mL/min/{1.73_m2} — ABNORMAL LOW (ref >59)
GFR calc non Af Amer: 50 mL/min/{1.73_m2} — ABNORMAL LOW (ref >59)
Globulin, Total: 2.6 g/dL (ref 1.5–4.5)
Glucose: 113 mg/dL — ABNORMAL HIGH (ref 65–99)
Potassium: 3.7 mmol/L (ref 3.5–5.2)
Sodium: 141 mmol/L (ref 134–144)
Total Protein: 6.6 g/dL (ref 6.0–8.5)

## 2020-02-04 LAB — CBC WITH DIFFERENTIAL/PLATELET
Basophils Absolute: 0 10*3/uL (ref 0.0–0.2)
Basos: 1 %
EOS (ABSOLUTE): 0.2 10*3/uL (ref 0.0–0.4)
Eos: 4 %
Hematocrit: 38.2 % (ref 34.0–46.6)
Hemoglobin: 12.6 g/dL (ref 11.1–15.9)
Immature Grans (Abs): 0 10*3/uL (ref 0.0–0.1)
Immature Granulocytes: 0 %
Lymphocytes Absolute: 1.1 10*3/uL (ref 0.7–3.1)
Lymphs: 19 %
MCH: 28.3 pg (ref 26.6–33.0)
MCHC: 33 g/dL (ref 31.5–35.7)
MCV: 86 fL (ref 79–97)
Monocytes Absolute: 0.4 10*3/uL (ref 0.1–0.9)
Monocytes: 7 %
Neutrophils Absolute: 4 10*3/uL (ref 1.4–7.0)
Neutrophils: 69 %
Platelets: 200 10*3/uL (ref 150–450)
RBC: 4.45 x10E6/uL (ref 3.77–5.28)
RDW: 13.7 % (ref 11.7–15.4)
WBC: 5.9 10*3/uL (ref 3.4–10.8)

## 2020-02-04 LAB — MICROALBUMIN / CREATININE URINE RATIO
Creatinine, Urine: 113 mg/dL
Microalb/Creat Ratio: 5 mg/g creat (ref 0–29)
Microalbumin, Urine: 6.2 ug/mL

## 2020-02-04 LAB — TSH: TSH: 2.73 u[IU]/mL (ref 0.450–4.500)

## 2020-02-07 ENCOUNTER — Other Ambulatory Visit: Payer: Self-pay | Admitting: *Deleted

## 2020-02-07 DIAGNOSIS — E1169 Type 2 diabetes mellitus with other specified complication: Secondary | ICD-10-CM

## 2020-02-07 DIAGNOSIS — E785 Hyperlipidemia, unspecified: Secondary | ICD-10-CM

## 2020-02-07 DIAGNOSIS — Z7689 Persons encountering health services in other specified circumstances: Secondary | ICD-10-CM

## 2020-02-07 MED ORDER — ROSUVASTATIN CALCIUM 5 MG PO TABS: 5.0000 mg | ORAL_TABLET | Freq: Every day | ORAL | 1 refills | Status: DC

## 2020-02-14 ENCOUNTER — Encounter (HOSPITAL_COMMUNITY): Payer: Self-pay | Admitting: Emergency Medicine

## 2020-02-14 ENCOUNTER — Other Ambulatory Visit: Payer: Self-pay

## 2020-02-14 ENCOUNTER — Emergency Department (HOSPITAL_COMMUNITY)
Admission: EM | Admit: 2020-02-14 | Discharge: 2020-02-14 | Disposition: A | Discharge status: Home / Self Care | Payer: Medicare Other | Attending: Emergency Medicine | Admitting: Emergency Medicine

## 2020-02-14 ENCOUNTER — Telehealth: Payer: Self-pay | Admitting: Family

## 2020-02-14 DIAGNOSIS — R030 Elevated blood-pressure reading, without diagnosis of hypertension: Secondary | ICD-10-CM | POA: Diagnosis not present

## 2020-02-14 DIAGNOSIS — Z5321 Procedure and treatment not carried out due to patient leaving prior to being seen by health care provider: Secondary | ICD-10-CM | POA: Insufficient documentation

## 2020-02-14 DIAGNOSIS — R42 Dizziness and giddiness: Secondary | ICD-10-CM | POA: Diagnosis present

## 2020-02-14 NOTE — Telephone Encounter (Signed)
Patient's blood pressure has been 84/52 and 95/56 this afternoon.  She has taken her Lisinopril already today.  You had discontinued her Hctz 25 mg on 02/03/2020 at her office visit.  Patient said she is feeling tired and weak.  What do you recommend?

## 2020-02-14 NOTE — ED Triage Notes (Signed)
Patient states she had a BP of 80's over 50's prior to arrival and was referred by her PCP. Endorses dizziness and recent medication BP medication changes.

## 2020-02-14 NOTE — Telephone Encounter (Signed)
Pt needs to go to ED and hold BP medications.

## 2020-02-14 NOTE — Telephone Encounter (Signed)
Patient aware.

## 2020-02-15 ENCOUNTER — Telehealth: Payer: Self-pay | Admitting: Family

## 2020-02-15 NOTE — Telephone Encounter (Signed)
Patient had an elevated blood pressure but left emergency department after long wait.  Her current pressure is 140/70 and it was 139/52 earlier.  She says it drops very low at times.  Her machine works correctly.  She was advised to continue taking blood pressures.  Her appointment is next week with pcp.  If she has any low or extremely high numbers , please call us or go back to emergency department.

## 2020-02-23 ENCOUNTER — Other Ambulatory Visit: Payer: Self-pay

## 2020-02-23 ENCOUNTER — Encounter (HOSPITAL_COMMUNITY): Payer: Self-pay | Admitting: Emergency Medicine

## 2020-02-23 ENCOUNTER — Emergency Department (HOSPITAL_COMMUNITY)
Admission: EM | Admit: 2020-02-23 | Discharge: 2020-02-24 | Disposition: A | Discharge status: Home / Self Care | Payer: Medicare Other | Source: Home / Self Care | Attending: Emergency Medicine | Admitting: Emergency Medicine

## 2020-02-23 DIAGNOSIS — K5730 Diverticulosis of large intestine without perforation or abscess without bleeding: Secondary | ICD-10-CM | POA: Insufficient documentation

## 2020-02-23 DIAGNOSIS — Z880 Allergy status to penicillin: Secondary | ICD-10-CM | POA: Insufficient documentation

## 2020-02-23 DIAGNOSIS — R1110 Vomiting, unspecified: Secondary | ICD-10-CM | POA: Diagnosis not present

## 2020-02-23 DIAGNOSIS — Z87891 Personal history of nicotine dependence: Secondary | ICD-10-CM | POA: Insufficient documentation

## 2020-02-23 DIAGNOSIS — R1013 Epigastric pain: Secondary | ICD-10-CM

## 2020-02-23 DIAGNOSIS — I10 Essential (primary) hypertension: Secondary | ICD-10-CM | POA: Insufficient documentation

## 2020-02-23 DIAGNOSIS — K529 Noninfective gastroenteritis and colitis, unspecified: Secondary | ICD-10-CM | POA: Diagnosis not present

## 2020-02-23 DIAGNOSIS — E119 Type 2 diabetes mellitus without complications: Secondary | ICD-10-CM | POA: Insufficient documentation

## 2020-02-23 DIAGNOSIS — E785 Hyperlipidemia, unspecified: Secondary | ICD-10-CM | POA: Insufficient documentation

## 2020-02-23 DIAGNOSIS — K2900 Acute gastritis without bleeding: Secondary | ICD-10-CM | POA: Diagnosis not present

## 2020-02-23 DIAGNOSIS — K449 Diaphragmatic hernia without obstruction or gangrene: Secondary | ICD-10-CM | POA: Insufficient documentation

## 2020-02-23 DIAGNOSIS — E039 Hypothyroidism, unspecified: Secondary | ICD-10-CM | POA: Insufficient documentation

## 2020-02-23 LAB — BASIC METABOLIC PANEL
Anion gap: 11 (ref 5–15)
BUN: 27 mg/dL — ABNORMAL HIGH (ref 8–23)
CO2: 24 mmol/L (ref 22–32)
Calcium: 9 mg/dL (ref 8.9–10.3)
Chloride: 106 mmol/L (ref 98–111)
Creatinine, Ser: 1.04 mg/dL — ABNORMAL HIGH (ref 0.44–1.00)
GFR calc Af Amer: >60 mL/min (ref >60)
GFR calc non Af Amer: 52 mL/min — ABNORMAL LOW (ref >60)
Glucose, Bld: 159 mg/dL — ABNORMAL HIGH (ref 70–99)
Potassium: 3.9 mmol/L (ref 3.5–5.1)
Sodium: 141 mmol/L (ref 135–145)

## 2020-02-23 LAB — HEPATIC FUNCTION PANEL
ALT: 17 U/L (ref 0–44)
AST: 27 U/L (ref 15–41)
Albumin: 4.1 g/dL (ref 3.5–5.0)
Alkaline Phosphatase: 84 U/L (ref 38–126)
Bilirubin, Direct: <0.1 mg/dL (ref 0.0–0.2)
Total Bilirubin: 0.6 mg/dL (ref 0.3–1.2)
Total Protein: 7.9 g/dL (ref 6.5–8.1)

## 2020-02-23 LAB — CBC
HCT: 41.4 % (ref 36.0–46.0)
Hemoglobin: 13.3 g/dL (ref 12.0–15.0)
MCH: 28.8 pg (ref 26.0–34.0)
MCHC: 32.1 g/dL (ref 30.0–36.0)
MCV: 89.6 fL (ref 80.0–100.0)
Platelets: 206 10*3/uL (ref 150–400)
RBC: 4.62 MIL/uL (ref 3.87–5.11)
RDW: 13.4 % (ref 11.5–15.5)
WBC: 7.8 10*3/uL (ref 4.0–10.5)
nRBC: 0 % (ref 0.0–0.2)

## 2020-02-23 LAB — LIPASE, BLOOD: Lipase: 36 U/L (ref 11–51)

## 2020-02-23 MED ORDER — SODIUM CHLORIDE 0.9 % IV BOLUS
1000.0000 mL | Freq: Once | INTRAVENOUS | Status: AC
  Administered 2020-02-23: 1000 mL via INTRAVENOUS

## 2020-02-23 MED ORDER — SODIUM CHLORIDE 0.9 % IV BOLUS
500.0000 mL | Freq: Once | INTRAVENOUS | Status: AC
  Administered 2020-02-23: 500 mL via INTRAVENOUS

## 2020-02-23 MED ORDER — ONDANSETRON HCL 4 MG/2ML IJ SOLN
4.0000 mg | Freq: Once | INTRAMUSCULAR | Status: AC
  Administered 2020-02-23: 4 mg via INTRAVENOUS
  Filled 2020-02-23: qty 2

## 2020-02-23 MED ORDER — FENTANYL CITRATE (PF) 100 MCG/2ML IJ SOLN
50.0000 ug | Freq: Once | INTRAMUSCULAR | Status: AC
  Administered 2020-02-23: 50 ug via INTRAVENOUS
  Filled 2020-02-23: qty 2

## 2020-02-23 NOTE — ED Triage Notes (Signed)
Pt presents with C/O of emesis since 1500 this afternoon. Pt denies fevers at home.

## 2020-02-23 NOTE — ED Provider Notes (Signed)
[facility] EMERGENCY DEPARTMENT Provider Note   CSN: [id number] Arrival date & time: 02/23/20  2124   Time seen 11:05 PM  History Chief Complaint  Patient presents with  . Emesis    Cynthia Mays is a 79 y.o. female.  HPI   Patient states she felt fine all day however about 3 PM she started having vomiting and diarrhea.  She has vomited about 7 times.  She describes runny and watery diarrhea x4.  She states her abdomen has some soreness and points to her epigastric area but she denies abdominal pain.  She has a history of a hiatal hernia but has never bothered her before.  She denies fever, cough, shortness of breath, chest pain, sore throat, or feeling dizzy or lightheaded.  She states she does have a dry mouth and has had some decreased urinary output today.  She states she has rhinorrhea when she throws up.  She denies any change in her diet, being around anybody who is sick, or being on antibiotics.  She states she currently is nauseated.  She states she has had an appendectomy in the past.  PCP Junie SpencerHawks, Christy A, FNP   Past Medical History:  Diagnosis Date  . Complication of anesthesia   . Depression   . Diabetes mellitus   . Fibromyalgia   . GERD (gastroesophageal reflux disease)   . Hiatal hernia   . Hyperlipidemia   . Hypertension   . Hypothyroidism   . Hypothyroidism 07/23/2016  . PONV (postoperative nausea and vomiting)     Patient Active Problem List   Diagnosis Date Noted  . Diarrhea 08/03/2019  . Epigastric abdominal tenderness 05/30/2019  . Morbid obesity (HCC) 03/31/2019  . Chronic use of benzodiazepine for therapeutic purpose 01/26/2018  . Hiatal hernia 10/29/2017  . Hyperlipidemia associated with type 2 diabetes mellitus (HCC) 12/31/2015  . Coronary artery disease due to lipid rich plaque 12/31/2015  . Left knee pain 10/24/2015  . 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 Hypertension associated with diabetes (HCC) 02/20/2015  . Chronic low  back pain with sciatica 02/20/2015  . Gastroesophageal reflux disease without esophagitis 11/20/2014  . Anxiety, generalized 11/20/2014  . Hypothyroidism 12/27/2013  . Type 2 diabetes mellitus without complication, without long-term current use of insulin (HCC) 12/27/2013  . Precordial pain 01/15/2013    Past Surgical History:  Procedure Laterality Date  . APPENDECTOMY  2002  . arthroscopy  right 2002, left 2007   bilateral knees  . BACK SURGERY  10/2007, 09/2008   x2  . BIOPSY  10/02/2016   Procedure: BIOPSY;  Surgeon: Malissa HippoNajeeb U Rehman, MD;  Location: [facility];  Service: Endoscopy;;  gastric  . CARPAL TUNNEL RELEASE  1986   bilateral  . CESAREAN SECTION  1969, 1967   x2  . ESOPHAGOGASTRODUODENOSCOPY N/A 10/02/2016   Procedure: ESOPHAGOGASTRODUODENOSCOPY (EGD);  Surgeon: Malissa HippoNajeeb U Rehman, MD;  Location: [facility];  Service: Endoscopy;  Laterality: N/A;  11:15 - moved to 2/1 @ 3:00  . KNEE SURGERY     both     OB History   No obstetric history on file.     Family History  Problem Relation Age of Onset  . Anesthesia problems Mother   . CAD Mother 1565  . Cancer Mother        Pancreatic cancer  . Hypertension Father   . Suicidality Father        gunshot  . Deafness Sister   . Heart disease Brother   .  Suicidality Son   . Cancer Sister        female   . Suicidality Brother   . Hypotension Neg Hx   . Pseudochol deficiency Neg Hx   . Malignant hyperthermia Neg Hx     Social History   Tobacco Use  . Smoking status: Former Smoker    Packs/day: 1.00    Years: 43.00    Pack years: 43.00    Types: Cigarettes  . Smokeless tobacco: Never Used  . Tobacco comment: > 20 years quit  Vaping Use  . Vaping Use: Never used  Substance Use Topics  . Alcohol use: No    Comment: occasionally wine  . Drug use: No    Home Medications Prior to Admission medications   Medication Sig Start Date End Date Taking? Authorizing Provider  clobetasol cream (TEMOVATE) 0.05 % Apply 1  application topically daily as needed (rash). 02/28/19   Lazaro Arms, MD  famotidine (PEPCID) 40 MG tablet Take 1 tablet (40 mg total) by mouth at bedtime. 12/28/19   Rehman, Joline Maxcy, MD  fexofenadine (ALLEGRA) 180 MG tablet Take 180 mg by mouth daily.    [provider]  fluticasone (FLONASE) 50 MCG/ACT nasal spray Place 2 sprays into the nose daily as needed for allergies.     [provider]  HYDROcodone-acetaminophen (NORCO/VICODIN) 5-325 MG tablet Take 1 tablet by mouth every 6 (six) hours as needed for moderate pain.    [provider]  levothyroxine (EUTHYROX) 112 MCG tablet TAKE 1 TABLET BY MOUTH ONCE DAILY BEFORE BREAKFAST 07/21/19   Sonny Masters, FNP  lisinopril (ZESTRIL) 10 MG tablet Take 0.5 tablets (5 mg total) by mouth daily. 10/24/19 01/22/20  Sonny Masters, FNP  metFORMIN (GLUCOPHAGE) 500 MG tablet Take 1 tablet (500 mg total) by mouth 2 (two) times daily. 04/25/19 10/24/19  Sonny Masters, FNP  Multiple Vitamins-Minerals (MULTIVITAMINS THER. W/MINERALS) TABS Take 1 tablet by mouth daily.      [provider]  naproxen sodium (ALEVE) 220 MG tablet Take 220 mg by mouth daily as needed.    [provider]  NIFEdipine (PROCARDIA XL/NIFEDICAL XL) 60 MG 24 hr tablet Take 1 tablet (60 mg total) by mouth daily. 01/24/20 04/23/20  [name] A, FNP  ondansetron (ZOFRAN) 4 MG tablet Take 1 tablet (4 mg total) by mouth every 6 (six) hours. 02/24/20   Devoria Albe, MD  rosuvastatin (CRESTOR) 5 MG tablet Take 1 tablet (5 mg total) by mouth daily. 02/07/20 05/07/20  Junie Spencer, FNP  sertraline (ZOLOFT) 50 MG tablet TAKE 1 & 1/2 (ONE & ONE-HALF) TABLETS BY MOUTH ONCE DAILY 01/24/20   [name] A, FNP    Allergies    Codeine, Penicillins, and Pravastatin  Review of Systems   Review of Systems  All other systems reviewed and are negative.   Physical Exam Updated Vital Signs BP 107/71   Pulse 62   Temp 97.6 F (36.4 C) (Oral)   Resp 17    Ht 4\' 11"  (1.499 m)   Wt 86.2 kg   SpO2 94%   BMI 38.38 kg/m   Physical Exam Vitals reviewed.  Constitutional:      Appearance: Normal appearance. She is obese.  HENT:     Head: Normocephalic and atraumatic.     Right Ear: External ear normal.     Left Ear: External ear normal.     Nose: Nose normal.     Mouth/Throat:  Mouth: Mucous membranes are dry.  Eyes:     Extraocular Movements: Extraocular movements intact.     Conjunctiva/sclera: Conjunctivae normal.     Pupils: Pupils are equal, round, and reactive to light.  Cardiovascular:     Rate and Rhythm: Normal rate.     Pulses: Normal pulses.     Heart sounds: Normal heart sounds. No murmur heard.   Pulmonary:     Effort: Pulmonary effort is normal. No respiratory distress.     Breath sounds: Normal breath sounds.  Abdominal:     General: Bowel sounds are normal.     Palpations: Abdomen is soft.     Tenderness: There is abdominal tenderness.       Comments: Patient states her worst pain is in the epigastric area than the left upper quadrant and with minimal discomfort in the left lower quadrant.  Musculoskeletal:        General: Normal range of motion.     Cervical back: Normal range of motion and neck supple.  Skin:    General: Skin is warm and dry.     Findings: No erythema.  Neurological:     General: No focal deficit present.     Mental Status: She is alert and oriented to person, place, and time.     Cranial Nerves: No cranial nerve deficit.  Psychiatric:        Mood and Affect: Mood normal.        Behavior: Behavior normal.        Thought Content: Thought content normal.     ED Results / Procedures / Treatments   Labs (all labs ordered are listed, but only abnormal results are displayed) Results for orders placed or performed during the hospital encounter of 02/23/20  CBC  Result Value Ref Range   WBC 7.8 4.0 - 10.5 K/uL   RBC 4.62 3.87 - 5.11 MIL/uL   Hemoglobin 13.3 12.0 - 15.0 g/dL   HCT  41.4 36 - 46 %   MCV 89.6 80.0 - 100.0 fL   MCH 28.8 26.0 - 34.0 pg   MCHC 32.1 30.0 - 36.0 g/dL   RDW 13.4 11.5 - 15.5 %   Platelets 206 150 - 400 K/uL   nRBC 0.0 0.0 - 0.2 %  Basic metabolic panel  Result Value Ref Range   Sodium 141 135 - 145 mmol/L   Potassium 3.9 3.5 - 5.1 mmol/L   Chloride 106 98 - 111 mmol/L   CO2 24 22 - 32 mmol/L   Glucose, Bld 159 (H) 70 - 99 mg/dL   BUN 27 (H) 8 - 23 mg/dL   Creatinine, Ser 1.04 (H) 0.44 - 1.00 mg/dL   Calcium 9.0 8.9 - 10.3 mg/dL   GFR calc non Af Amer 52 (L) >60 mL/min   GFR calc Af Amer >60 >60 mL/min   Anion gap 11 5 - 15  Lipase, blood  Result Value Ref Range   Lipase 36 11 - 51 U/L  Hepatic function panel  Result Value Ref Range   Total Protein 7.9 6.5 - 8.1 g/dL   Albumin 4.1 3.5 - 5.0 g/dL   AST 27 15 - 41 U/L   ALT 17 0 - 44 U/L   Alkaline Phosphatase 84 38 - 126 U/L   Total Bilirubin 0.6 0.3 - 1.2 mg/dL   Bilirubin, Direct <0.1 0.0 - 0.2 mg/dL   Indirect Bilirubin NOT CALCULATED 0.3 - 0.9 mg/dL  Urinalysis, Routine w reflex microscopic  Result  Value Ref Range   Color, Urine YELLOW YELLOW   APPearance CLOUDY (A) CLEAR   Specific Gravity, Urine 1.025 1.005 - 1.030   pH 5.0 5.0 - 8.0   Glucose, UA NEGATIVE NEGATIVE mg/dL   Hgb urine dipstick NEGATIVE NEGATIVE   Bilirubin Urine NEGATIVE NEGATIVE   Ketones, ur NEGATIVE NEGATIVE mg/dL   Protein, ur 30 (A) NEGATIVE mg/dL   Nitrite NEGATIVE NEGATIVE   Leukocytes,Ua LARGE (A) NEGATIVE   RBC / HPF 21-50 0 - 5 RBC/hpf   WBC, UA >50 (H) 0 - 5 WBC/hpf   Bacteria, UA NONE SEEN NONE SEEN   Squamous Epithelial / LPF 11-20 0 - 5   Mucus PRESENT    Non Squamous Epithelial 11-20 (A) NONE SEEN   Laboratory interpretation all normal except nonfasting hyperglycemia, mild elevation of BUN, contaminated urinalysis, nonfasting hyperglycemia,    EKG None  Radiology CT Abdomen Pelvis W Contrast  Result Date: 02/24/2020 CLINICAL DATA:  Vomiting.  Nausea. EXAM: CT ABDOMEN AND  PELVIS WITH CONTRAST TECHNIQUE: Multidetector CT imaging of the abdomen and pelvis was performed using the standard protocol following bolus administration of intravenous contrast. CONTRAST:  65mL OMNIPAQUE IOHEXOL 300 MG/ML  SOLN COMPARISON:  CT 04/04/2016 FINDINGS: Lower chest: Linear subsegmental atelectasis in the lower lobes. No pleural fluid. Hepatobiliary: 9 mm subcapsular cyst to the right hepatic dome. No suspicious lesion. Gallbladder physiologically distended, no calcified stone. No biliary dilatation. Pancreas: No ductal dilatation or inflammation. Spleen: Normal in size without focal abnormality. Adrenals/Urinary Tract: No adrenal nodule. No hydronephrosis or perinephric edema. Small cyst in the upper right kidney. Homogeneous renal enhancement with symmetric excretion on delayed phase imaging. Urinary bladder is partially distended without wall thickening. Stomach/Bowel: Small hiatal hernia with mild distal esophageal wall thickening. Stomach otherwise unremarkable. There is no small bowel obstruction or inflammation. Appendix surgically absent. Scattered distal colonic diverticulosis without diverticulitis. Vascular/Lymphatic: Moderate aortic atherosclerosis. No aortic aneurysm. Patent portal vein. Small central mesenteric and retroperitoneal nodes that are not enlarged by size criteria. Reproductive: Calcified uterine fibroids. There is a 4.2 cm left ovarian cyst that was not seen on prior exam. Right ovary is not well visualized. Other: Small fat containing umbilical hernia. No ascites. No free air. No intra-abdominal abscess. No omental thickening. Musculoskeletal: Postsurgical and degenerative change in the spine. There are no acute or suspicious osseous abnormalities. IMPRESSION: 1. Small hiatal hernia with mild distal esophageal wall thickening, can be seen with reflux or esophagitis. 2. A 4.2 cm left ovarian cyst was not seen on prior exam. Recommend nonemergent pelvic ultrasound for  characterization. 3. Colonic diverticulosis without diverticulitis. Aortic Atherosclerosis (ICD10-I70.0). Electronically Signed   By: Narda Rutherford M.D.   On: 02/24/2020 03:53    Procedures Procedures (including critical care time)  Medications Ordered in ED Medications  sodium chloride 0.9 % bolus 1,000 mL (0 mLs Intravenous Stopped 02/24/20 0039)  sodium chloride 0.9 % bolus 500 mL (0 mLs Intravenous Stopped 02/24/20 0039)  fentaNYL (SUBLIMAZE) injection 50 mcg (50 mcg Intravenous Given 02/23/20 2328)  ondansetron (ZOFRAN) injection 4 mg (4 mg Intravenous Given 02/23/20 2327)  sodium chloride 0.9 % bolus 500 mL (0 mLs Intravenous Stopped 02/24/20 0151)  ondansetron (ZOFRAN) injection 4 mg (4 mg Intravenous Given 02/24/20 0044)  iohexol (OMNIPAQUE) 300 MG/ML solution 80 mL (80 mLs Intravenous Contrast Given 02/24/20 0315)    ED Course  I have reviewed the triage vital signs and the nursing notes.  Pertinent labs & imaging results that were available during my  care of the patient were reviewed by me and considered in my medical decision making (see chart for details).    MDM Rules/Calculators/A&P                          Patient appears dehydrated, she was given IV fluids, IV nausea and pain medication.  I added tests to her standing orders.  12:30 AM nurse reports patient has not urinated after 1500 cc of normal saline and still has some nausea.  She was given an additional 500 cc bolus and a repeat dose of Zofran.  2:00 AM patient was finally able to give a small amount of urine to send the lab.  Patient requesting to drink stating her nausea is gone.  Patient unfortunately vomited after she tried to drink.  2:45 AM I talked to the patient and her family member that we probably should do a CT scan to try to figure out why she is having the nausea and vomiting and they are agreeable.  Recheck at 4:15 AM I talked to the patient and her family member that her CT did not show any acute  changes that would account for her symptoms.  Patient states since I saw her last she drank another whole cup of ginger ale and she has not had any more nausea or vomiting.  She also has gone to the bathroom and had urinary output.  At this point she feels ready to be discharged home.  I think the most likely etiology of her pain tonight was for a hiatal hernia.  Final Clinical Impression(s) / ED Diagnoses Final diagnoses:  Epigastric abdominal pain    Rx / DC Orders ED Discharge Orders         Ordered    ondansetron (ZOFRAN) 4 MG tablet  Every 6 hours     Discontinue  Reprint     02/24/20 0419          Plan discharge  Devoria Albe, MD, Concha Pyo, MD 02/24/20 [phone]

## 2020-02-24 ENCOUNTER — Emergency Department (HOSPITAL_COMMUNITY): Payer: Medicare Other

## 2020-02-24 DIAGNOSIS — R1110 Vomiting, unspecified: Secondary | ICD-10-CM | POA: Diagnosis not present

## 2020-02-24 LAB — URINALYSIS, ROUTINE W REFLEX MICROSCOPIC
Bacteria, UA: NONE SEEN
Bilirubin Urine: NEGATIVE
Glucose, UA: NEGATIVE mg/dL
Hgb urine dipstick: NEGATIVE
Ketones, ur: NEGATIVE mg/dL
Nitrite: NEGATIVE
Protein, ur: 30 mg/dL — AB
Specific Gravity, Urine: 1.025 (ref 1.005–1.030)
WBC, UA: >50 WBC/hpf — ABNORMAL HIGH (ref 0–5)
pH: 5 (ref 5.0–8.0)

## 2020-02-24 MED ORDER — ONDANSETRON HCL 4 MG PO TABS
4.0000 mg | ORAL_TABLET | Freq: Four times a day (QID) | ORAL | 0 refills | Status: DC
Start: 2020-02-24 — End: 2020-04-19

## 2020-02-24 MED ORDER — IOHEXOL 300 MG/ML  SOLN
80.0000 mL | Freq: Once | INTRAMUSCULAR | Status: AC | PRN
  Administered 2020-02-24: 80 mL via INTRAVENOUS

## 2020-02-24 MED ORDER — SODIUM CHLORIDE 0.9 % IV BOLUS
500.0000 mL | Freq: Once | INTRAVENOUS | Status: AC
  Administered 2020-02-24: 500 mL via INTRAVENOUS

## 2020-02-24 MED ORDER — ONDANSETRON HCL 4 MG/2ML IJ SOLN
4.0000 mg | Freq: Once | INTRAMUSCULAR | Status: AC
  Administered 2020-02-24: 4 mg via INTRAVENOUS
  Filled 2020-02-24: qty 2

## 2020-02-24 NOTE — Discharge Instructions (Addendum)
Drink plenty of fluids (clear liquids) then start a bland diet later this morning such as toast, crackers, jello, [employer] chicken noodle soup. Use the zofran for nausea or vomiting. Take imodium OTC for diarrhea. Avoid milk products until the diarrhea is gone.  You did have a new cyst on your left ovary.  Please let your primary care doctor know so you can get a outpatient ultrasound done to evaluate that further.  That however is not where you are pain was located this evening which was in your upper abdomen.  The pain was most likely from your hiatal hernia.  Return to the emergency department if you have uncontrollable vomiting or pain.

## 2020-02-25 ENCOUNTER — Encounter (HOSPITAL_COMMUNITY): Payer: Self-pay

## 2020-02-25 ENCOUNTER — Inpatient Hospital Stay (HOSPITAL_COMMUNITY)
Admission: EM | Admit: 2020-02-25 | Discharge: 2020-02-28 | DRG: 392 | Disposition: A | Discharge status: Home / Self Care | Payer: Medicare Other | Attending: Internal Medicine | Admitting: Internal Medicine

## 2020-02-25 ENCOUNTER — Other Ambulatory Visit: Payer: Self-pay

## 2020-02-25 ENCOUNTER — Emergency Department (HOSPITAL_COMMUNITY): Payer: Medicare Other

## 2020-02-25 DIAGNOSIS — R1013 Epigastric pain: Secondary | ICD-10-CM | POA: Diagnosis not present

## 2020-02-25 DIAGNOSIS — Z7984 Long term (current) use of oral hypoglycemic drugs: Secondary | ICD-10-CM

## 2020-02-25 DIAGNOSIS — R109 Unspecified abdominal pain: Secondary | ICD-10-CM | POA: Diagnosis not present

## 2020-02-25 DIAGNOSIS — K449 Diaphragmatic hernia without obstruction or gangrene: Secondary | ICD-10-CM | POA: Diagnosis present

## 2020-02-25 DIAGNOSIS — E039 Hypothyroidism, unspecified: Secondary | ICD-10-CM | POA: Diagnosis present

## 2020-02-25 DIAGNOSIS — Z791 Long term (current) use of non-steroidal anti-inflammatories (NSAID): Secondary | ICD-10-CM

## 2020-02-25 DIAGNOSIS — K219 Gastro-esophageal reflux disease without esophagitis: Secondary | ICD-10-CM | POA: Diagnosis present

## 2020-02-25 DIAGNOSIS — Z87891 Personal history of nicotine dependence: Secondary | ICD-10-CM

## 2020-02-25 DIAGNOSIS — K2900 Acute gastritis without bleeding: Secondary | ICD-10-CM | POA: Diagnosis not present

## 2020-02-25 DIAGNOSIS — Z79899 Other long term (current) drug therapy: Secondary | ICD-10-CM

## 2020-02-25 DIAGNOSIS — Z79891 Long term (current) use of opiate analgesic: Secondary | ICD-10-CM

## 2020-02-25 DIAGNOSIS — Z6841 Body Mass Index (BMI) 40.0 and over, adult: Secondary | ICD-10-CM

## 2020-02-25 DIAGNOSIS — Z8249 Family history of ischemic heart disease and other diseases of the circulatory system: Secondary | ICD-10-CM

## 2020-02-25 DIAGNOSIS — K529 Noninfective gastroenteritis and colitis, unspecified: Principal | ICD-10-CM

## 2020-02-25 DIAGNOSIS — Z9049 Acquired absence of other specified parts of digestive tract: Secondary | ICD-10-CM

## 2020-02-25 DIAGNOSIS — Z79890 Hormone replacement therapy (postmenopausal): Secondary | ICD-10-CM

## 2020-02-25 DIAGNOSIS — Z20822 Contact with and (suspected) exposure to covid-19: Secondary | ICD-10-CM | POA: Diagnosis present

## 2020-02-25 DIAGNOSIS — E860 Dehydration: Secondary | ICD-10-CM | POA: Diagnosis not present

## 2020-02-25 DIAGNOSIS — I10 Essential (primary) hypertension: Secondary | ICD-10-CM | POA: Diagnosis present

## 2020-02-25 DIAGNOSIS — E669 Obesity, unspecified: Secondary | ICD-10-CM

## 2020-02-25 DIAGNOSIS — Z800 Family history of malignant neoplasm of digestive organs: Secondary | ICD-10-CM

## 2020-02-25 DIAGNOSIS — E119 Type 2 diabetes mellitus without complications: Secondary | ICD-10-CM

## 2020-02-25 DIAGNOSIS — R0689 Other abnormalities of breathing: Secondary | ICD-10-CM | POA: Diagnosis not present

## 2020-02-25 DIAGNOSIS — I499 Cardiac arrhythmia, unspecified: Secondary | ICD-10-CM | POA: Diagnosis not present

## 2020-02-25 DIAGNOSIS — J45909 Unspecified asthma, uncomplicated: Secondary | ICD-10-CM | POA: Diagnosis present

## 2020-02-25 DIAGNOSIS — Z743 Need for continuous supervision: Secondary | ICD-10-CM | POA: Diagnosis not present

## 2020-02-25 LAB — CBC WITH DIFFERENTIAL/PLATELET
Abs Immature Granulocytes: 0.02 10*3/uL (ref 0.00–0.07)
Basophils Absolute: 0 10*3/uL (ref 0.0–0.1)
Basophils Relative: 0 %
Eosinophils Absolute: 0.1 10*3/uL (ref 0.0–0.5)
Eosinophils Relative: 1 %
HCT: 35.7 % — ABNORMAL LOW (ref 36.0–46.0)
Hemoglobin: 11.1 g/dL — ABNORMAL LOW (ref 12.0–15.0)
Immature Granulocytes: 0 %
Lymphocytes Relative: 9 %
Lymphs Abs: 0.6 10*3/uL — ABNORMAL LOW (ref 0.7–4.0)
MCH: 28.5 pg (ref 26.0–34.0)
MCHC: 31.1 g/dL (ref 30.0–36.0)
MCV: 91.8 fL (ref 80.0–100.0)
Monocytes Absolute: 0.4 10*3/uL (ref 0.1–1.0)
Monocytes Relative: 6 %
Neutro Abs: 5.9 10*3/uL (ref 1.7–7.7)
Neutrophils Relative %: 84 %
Platelets: 167 10*3/uL (ref 150–400)
RBC: 3.89 MIL/uL (ref 3.87–5.11)
RDW: 13.5 % (ref 11.5–15.5)
WBC: 7 10*3/uL (ref 4.0–10.5)
nRBC: 0 % (ref 0.0–0.2)

## 2020-02-25 LAB — URINE CULTURE: Culture: 30000 — AB

## 2020-02-25 MED ORDER — ONDANSETRON HCL 4 MG/2ML IJ SOLN
4.0000 mg | Freq: Once | INTRAMUSCULAR | Status: AC
  Administered 2020-02-25: 4 mg via INTRAVENOUS
  Filled 2020-02-25: qty 2

## 2020-02-25 MED ORDER — IOHEXOL 300 MG/ML  SOLN
80.0000 mL | Freq: Once | INTRAMUSCULAR | Status: AC | PRN
  Administered 2020-02-26: 80 mL via INTRAVENOUS

## 2020-02-25 MED ORDER — SODIUM CHLORIDE 0.9 % IV BOLUS
1000.0000 mL | Freq: Once | INTRAVENOUS | Status: AC
  Administered 2020-02-25: 1000 mL via INTRAVENOUS

## 2020-02-25 MED ORDER — SODIUM CHLORIDE 0.9 % IV SOLN: INTRAVENOUS | Status: DC

## 2020-02-25 MED ORDER — HYDROMORPHONE HCL 1 MG/ML IJ SOLN
1.0000 mg | Freq: Once | INTRAMUSCULAR | Status: AC
  Administered 2020-02-25: 1 mg via INTRAVENOUS
  Filled 2020-02-25: qty 1

## 2020-02-25 NOTE — ED Provider Notes (Signed)
[facility] EMERGENCY DEPARTMENT Provider Note   CSN: [id number] Arrival date & time: 02/25/20  1910     History Chief Complaint  Patient presents with  . Abdominal Pain    Cynthia Mays is a 79 y.o. female.  Patient returns for persistent abdominal pain mostly epigastric but does radiate down into the central part of the abdomen.  Associated with multiple and persistent episodes of vomiting.  No blood in the vomit.  No diarrhea.  Patient states the pain is getting worse.  She was given oral Zofran prescription which she filled today but it did not help at all.  Patient was seen in the emergency department late on June 24 and was discharged home in the early morning on June 25.  On June 24 at 3 in the afternoon the symptoms started.  No diarrhea.  Patient states not able to keep anything down.  Patient did have a CAT scan done during that visit and labs without any significant abnormalities.  Patient does have a known history of hiatal hernia and that showed up on the CT scan.  There was nothing significant about the hiatal hernia findings on the CAT scan.        Past Medical History:  Diagnosis Date  . Complication of anesthesia   . Depression   . Diabetes mellitus   . Fibromyalgia   . GERD (gastroesophageal reflux disease)   . Hiatal hernia   . Hyperlipidemia   . Hypertension   . Hypothyroidism   . Hypothyroidism 07/23/2016  . PONV (postoperative nausea and vomiting)     Patient Active Problem List   Diagnosis Date Noted  . Diarrhea 08/03/2019  . Epigastric abdominal tenderness 05/30/2019  . Morbid obesity (HCC) 03/31/2019  . Chronic use of benzodiazepine for therapeutic purpose 01/26/2018  . Hiatal hernia 10/29/2017  . Hyperlipidemia associated with type 2 diabetes mellitus (HCC) 12/31/2015  . Coronary artery disease due to lipid rich plaque 12/31/2015  . Left knee pain 10/24/2015  . 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 Hypertension associated with  diabetes (HCC) 02/20/2015  . Chronic low back pain with sciatica 02/20/2015  . Gastroesophageal reflux disease without esophagitis 11/20/2014  . Anxiety, generalized 11/20/2014  . Hypothyroidism 12/27/2013  . Type 2 diabetes mellitus without complication, without long-term current use of insulin (HCC) 12/27/2013  . Precordial pain 01/15/2013    Past Surgical History:  Procedure Laterality Date  . APPENDECTOMY  2002  . arthroscopy  right 2002, left 2007   bilateral knees  . BACK SURGERY  10/2007, 09/2008   x2  . BIOPSY  10/02/2016   Procedure: BIOPSY;  Surgeon: Malissa Hippo, MD;  Location: [facility];  Service: Endoscopy;;  gastric  . CARPAL TUNNEL RELEASE  1986   bilateral  . CESAREAN SECTION  1969, 1967   x2  . ESOPHAGOGASTRODUODENOSCOPY N/A 10/02/2016   Procedure: ESOPHAGOGASTRODUODENOSCOPY (EGD);  Surgeon: Malissa Hippo, MD;  Location: [facility];  Service: Endoscopy;  Laterality: N/A;  11:15 - moved to 2/1 @ 3:00  . KNEE SURGERY     both     OB History   No obstetric history on file.     Family History  Problem Relation Age of Onset  . Anesthesia problems Mother   . CAD Mother 68  . Cancer Mother        Pancreatic cancer  . Hypertension Father   . Suicidality Father        gunshot  . Deafness  Sister   . Heart disease Brother   . Suicidality Son   . Cancer Sister        female   . Suicidality Brother   . Hypotension Neg Hx   . Pseudochol deficiency Neg Hx   . Malignant hyperthermia Neg Hx     Social History   Tobacco Use  . Smoking status: Former Smoker    Packs/day: 1.00    Years: 43.00    Pack years: 43.00    Types: Cigarettes  . Smokeless tobacco: Never Used  . Tobacco comment: > 20 years quit  Vaping Use  . Vaping Use: Never used  Substance Use Topics  . Alcohol use: No    Comment: occasionally wine  . Drug use: No    Home Medications Prior to Admission medications   Medication Sig Start Date End Date Taking? Authorizing Provider    clobetasol cream (TEMOVATE) 0.05 % Apply 1 application topically daily as needed (rash). 02/28/19   Lazaro Arms, MD  famotidine (PEPCID) 40 MG tablet Take 1 tablet (40 mg total) by mouth at bedtime. 12/28/19   Rehman, Joline Maxcy, MD  fexofenadine (ALLEGRA) 180 MG tablet Take 180 mg by mouth daily.    [provider]  fluticasone (FLONASE) 50 MCG/ACT nasal spray Place 2 sprays into the nose daily as needed for allergies.     [provider]  HYDROcodone-acetaminophen (NORCO/VICODIN) 5-325 MG tablet Take 1 tablet by mouth every 6 (six) hours as needed for moderate pain.    [provider]  levothyroxine (EUTHYROX) 112 MCG tablet TAKE 1 TABLET BY MOUTH ONCE DAILY BEFORE BREAKFAST 07/21/19   Sonny Masters, FNP  lisinopril (ZESTRIL) 10 MG tablet Take 0.5 tablets (5 mg total) by mouth daily. 10/24/19 01/22/20  Sonny Masters, FNP  metFORMIN (GLUCOPHAGE) 500 MG tablet Take 1 tablet (500 mg total) by mouth 2 (two) times daily. 04/25/19 10/24/19  Sonny Masters, FNP  Multiple Vitamins-Minerals (MULTIVITAMINS THER. W/MINERALS) TABS Take 1 tablet by mouth daily.      [provider]  naproxen sodium (ALEVE) 220 MG tablet Take 220 mg by mouth daily as needed.    [provider]  NIFEdipine (PROCARDIA XL/NIFEDICAL XL) 60 MG 24 hr tablet Take 1 tablet (60 mg total) by mouth daily. 01/24/20 04/23/20  [name] A, FNP  ondansetron (ZOFRAN) 4 MG tablet Take 1 tablet (4 mg total) by mouth every 6 (six) hours. 02/24/20   Devoria Albe, MD  rosuvastatin (CRESTOR) 5 MG tablet Take 1 tablet (5 mg total) by mouth daily. 02/07/20 05/07/20  Junie Spencer, FNP  sertraline (ZOLOFT) 50 MG tablet TAKE 1 & 1/2 (ONE & ONE-HALF) TABLETS BY MOUTH ONCE DAILY 01/24/20   [name] A, FNP    Allergies    Codeine, Penicillins, and Pravastatin  Review of Systems   Review of Systems  Constitutional: Negative for chills and fever.  HENT: Negative for congestion, rhinorrhea and sore throat.    Eyes: Negative for visual disturbance.  Respiratory: Negative for cough and shortness of breath.   Cardiovascular: Negative for chest pain and leg swelling.  Gastrointestinal: Positive for abdominal pain, nausea and vomiting. Negative for diarrhea.  Genitourinary: Negative for dysuria.  Musculoskeletal: Negative for back pain and neck pain.  Skin: Negative for rash.  Neurological: Negative for dizziness, light-headedness and headaches.  Hematological: Does not bruise/bleed easily.  Psychiatric/Behavioral: Negative for confusion.    Physical Exam Updated Vital Signs BP (!) 148/72   Pulse  68   Temp 98.1 F (36.7 C) (Oral)   Resp 18   Ht 1.499 m (4\' 11" )   Wt 86.2 kg   SpO2 93%   BMI 38.38 kg/m   Physical Exam Vitals and nursing note reviewed.  Constitutional:      General: She is in acute distress.     Appearance: She is well-developed.  HENT:     Head: Normocephalic and atraumatic.  Eyes:     Extraocular Movements: Extraocular movements intact.     Conjunctiva/sclera: Conjunctivae normal.     Pupils: Pupils are equal, round, and reactive to light.  Cardiovascular:     Rate and Rhythm: Normal rate and regular rhythm.     Heart sounds: No murmur heard.   Pulmonary:     Effort: Pulmonary effort is normal. No respiratory distress.     Breath sounds: Normal breath sounds.  Abdominal:     Palpations: Abdomen is soft.     Tenderness: There is abdominal tenderness.     Comments: Tenderness mostly epigastric area.  Musculoskeletal:     Cervical back: Neck supple.  Skin:    General: Skin is warm and dry.  Neurological:     General: No focal deficit present.     Mental Status: She is alert and oriented to person, place, and time.     Cranial Nerves: No cranial nerve deficit.     Sensory: No sensory deficit.     Motor: No weakness.     ED Results / Procedures / Treatments   Labs (all labs ordered are listed, but only abnormal results are displayed) Labs Reviewed   CBC WITH DIFFERENTIAL/PLATELET - Abnormal; Notable for the following components:      Result Value   Hemoglobin 11.1 (*)    HCT 35.7 (*)    Lymphs Abs 0.6 (*)    All other components within normal limits  COMPREHENSIVE METABOLIC PANEL - Abnormal; Notable for the following components:   Glucose, Bld 118 (*)    Calcium 7.9 (*)    Albumin 3.4 (*)    AST 44 (*)    All other components within normal limits  LIPASE, BLOOD    EKG None  Radiology CT Abdomen Pelvis W Contrast  Result Date: 02/24/2020 CLINICAL DATA:  Vomiting.  Nausea. EXAM: CT ABDOMEN AND PELVIS WITH CONTRAST TECHNIQUE: Multidetector CT imaging of the abdomen and pelvis was performed using the standard protocol following bolus administration of intravenous contrast. CONTRAST:  75mL OMNIPAQUE IOHEXOL 300 MG/ML  SOLN COMPARISON:  CT 04/04/2016 FINDINGS: Lower chest: Linear subsegmental atelectasis in the lower lobes. No pleural fluid. Hepatobiliary: 9 mm subcapsular cyst to the right hepatic dome. No suspicious lesion. Gallbladder physiologically distended, no calcified stone. No biliary dilatation. Pancreas: No ductal dilatation or inflammation. Spleen: Normal in size without focal abnormality. Adrenals/Urinary Tract: No adrenal nodule. No hydronephrosis or perinephric edema. Small cyst in the upper right kidney. Homogeneous renal enhancement with symmetric excretion on delayed phase imaging. Urinary bladder is partially distended without wall thickening. Stomach/Bowel: Small hiatal hernia with mild distal esophageal wall thickening. Stomach otherwise unremarkable. There is no small bowel obstruction or inflammation. Appendix surgically absent. Scattered distal colonic diverticulosis without diverticulitis. Vascular/Lymphatic: Moderate aortic atherosclerosis. No aortic aneurysm. Patent portal vein. Small central mesenteric and retroperitoneal nodes that are not enlarged by size criteria. Reproductive: Calcified uterine fibroids. There  is a 4.2 cm left ovarian cyst that was not seen on prior exam. Right ovary is not well visualized. Other: Small fat  containing umbilical hernia. No ascites. No free air. No intra-abdominal abscess. No omental thickening. Musculoskeletal: Postsurgical and degenerative change in the spine. There are no acute or suspicious osseous abnormalities. IMPRESSION: 1. Small hiatal hernia with mild distal esophageal wall thickening, can be seen with reflux or esophagitis. 2. A 4.2 cm left ovarian cyst was not seen on prior exam. Recommend nonemergent pelvic ultrasound for characterization. 3. Colonic diverticulosis without diverticulitis. Aortic Atherosclerosis (ICD10-I70.0). Electronically Signed   By: Keith Rake M.D.   On: 02/24/2020 03:53    Procedures Procedures (including critical care time)  Medications Ordered in ED Medications  0.9 %  sodium chloride infusion ( Intravenous New Bag/Given 02/25/20 2311)  iohexol (OMNIPAQUE) 300 MG/ML solution 80 mL (has no administration in time range)  ondansetron (ZOFRAN) injection 4 mg (4 mg Intravenous Given 02/25/20 2310)  sodium chloride 0.9 % bolus 1,000 mL (1,000 mLs Intravenous New Bag/Given 02/25/20 2311)  HYDROmorphone (DILAUDID) injection 1 mg (1 mg Intravenous Given 02/25/20 2311)    ED Course  I have reviewed the triage vital signs and the nursing notes.  Pertinent labs & imaging results that were available during my care of the patient were reviewed by me and considered in my medical decision making (see chart for details).    MDM Rules/Calculators/A&P                          Patient states unable to keep anything down.  Is had persistent vomiting.  Zofran not helping.  Was oral tablets.  Patient has tenderness in the epigastric part of her abdomen.  States that the abdominal pain is much worse.  Based on this even though she just had a work-up on the 25th we will go ahead and repeat CT scan of the abdomen and repeat labs give IV fluids and get  Zofran and pain medicine.  Patient's labs without any significant abnormality no leukocytosis.  No significant electrolyte abnormalities.  Her function test without significant changes.  Lipase normal.  CT scan is pending Final Clinical Impression(s) / ED Diagnoses Final diagnoses:  Acute gastritis without hemorrhage, unspecified gastritis type  Epigastric pain    Rx / DC Orders ED Discharge Orders    None       Fredia Sorrow, MD 02/26/20 [phone]

## 2020-02-25 NOTE — ED Triage Notes (Signed)
Pt to er room number 8, pt was here earlier today for the same, states that she was seen for nausea, vomiting and abd pain, states that she was d/c this am and got her zofran script filled, states that it doesn't seem to be helping, pt to er via ems, ems placed a 22 gauge iv in L forearm and gave two 500 ml boluses of fluid.

## 2020-02-26 DIAGNOSIS — Z791 Long term (current) use of non-steroidal anti-inflammatories (NSAID): Secondary | ICD-10-CM | POA: Diagnosis not present

## 2020-02-26 DIAGNOSIS — E039 Hypothyroidism, unspecified: Secondary | ICD-10-CM | POA: Diagnosis not present

## 2020-02-26 DIAGNOSIS — R0902 Hypoxemia: Secondary | ICD-10-CM | POA: Diagnosis not present

## 2020-02-26 DIAGNOSIS — Z87891 Personal history of nicotine dependence: Secondary | ICD-10-CM | POA: Diagnosis not present

## 2020-02-26 DIAGNOSIS — K449 Diaphragmatic hernia without obstruction or gangrene: Secondary | ICD-10-CM | POA: Diagnosis not present

## 2020-02-26 DIAGNOSIS — R1110 Vomiting, unspecified: Secondary | ICD-10-CM | POA: Diagnosis not present

## 2020-02-26 DIAGNOSIS — Z9049 Acquired absence of other specified parts of digestive tract: Secondary | ICD-10-CM | POA: Diagnosis not present

## 2020-02-26 DIAGNOSIS — E119 Type 2 diabetes mellitus without complications: Secondary | ICD-10-CM | POA: Diagnosis not present

## 2020-02-26 DIAGNOSIS — Z8249 Family history of ischemic heart disease and other diseases of the circulatory system: Secondary | ICD-10-CM | POA: Diagnosis not present

## 2020-02-26 DIAGNOSIS — Z79899 Other long term (current) drug therapy: Secondary | ICD-10-CM | POA: Diagnosis not present

## 2020-02-26 DIAGNOSIS — Z79891 Long term (current) use of opiate analgesic: Secondary | ICD-10-CM | POA: Diagnosis not present

## 2020-02-26 DIAGNOSIS — Z20822 Contact with and (suspected) exposure to covid-19: Secondary | ICD-10-CM | POA: Diagnosis not present

## 2020-02-26 DIAGNOSIS — K219 Gastro-esophageal reflux disease without esophagitis: Secondary | ICD-10-CM | POA: Diagnosis not present

## 2020-02-26 DIAGNOSIS — Z79890 Hormone replacement therapy (postmenopausal): Secondary | ICD-10-CM | POA: Diagnosis not present

## 2020-02-26 DIAGNOSIS — K529 Noninfective gastroenteritis and colitis, unspecified: Secondary | ICD-10-CM | POA: Diagnosis not present

## 2020-02-26 DIAGNOSIS — Z7984 Long term (current) use of oral hypoglycemic drugs: Secondary | ICD-10-CM | POA: Diagnosis not present

## 2020-02-26 DIAGNOSIS — Z6841 Body Mass Index (BMI) 40.0 and over, adult: Secondary | ICD-10-CM | POA: Diagnosis not present

## 2020-02-26 DIAGNOSIS — J45909 Unspecified asthma, uncomplicated: Secondary | ICD-10-CM | POA: Diagnosis not present

## 2020-02-26 DIAGNOSIS — R1013 Epigastric pain: Secondary | ICD-10-CM | POA: Diagnosis not present

## 2020-02-26 DIAGNOSIS — Z800 Family history of malignant neoplasm of digestive organs: Secondary | ICD-10-CM | POA: Diagnosis not present

## 2020-02-26 DIAGNOSIS — I10 Essential (primary) hypertension: Secondary | ICD-10-CM | POA: Diagnosis not present

## 2020-02-26 LAB — COMPREHENSIVE METABOLIC PANEL
ALT: 27 U/L (ref 0–44)
ALT: 29 U/L (ref 0–44)
AST: 42 U/L — ABNORMAL HIGH (ref 15–41)
AST: 44 U/L — ABNORMAL HIGH (ref 15–41)
Albumin: 3.4 g/dL — ABNORMAL LOW (ref 3.5–5.0)
Albumin: 3.4 g/dL — ABNORMAL LOW (ref 3.5–5.0)
Alkaline Phosphatase: 61 U/L (ref 38–126)
Alkaline Phosphatase: 63 U/L (ref 38–126)
Anion gap: 7 (ref 5–15)
Anion gap: 8 (ref 5–15)
BUN: 13 mg/dL (ref 8–23)
BUN: 16 mg/dL (ref 8–23)
CO2: 23 mmol/L (ref 22–32)
CO2: 24 mmol/L (ref 22–32)
Calcium: 7.9 mg/dL — ABNORMAL LOW (ref 8.9–10.3)
Calcium: 7.9 mg/dL — ABNORMAL LOW (ref 8.9–10.3)
Chloride: 106 mmol/L (ref 98–111)
Chloride: 108 mmol/L (ref 98–111)
Creatinine, Ser: 0.92 mg/dL (ref 0.44–1.00)
Creatinine, Ser: 0.95 mg/dL (ref 0.44–1.00)
GFR calc Af Amer: >60 mL/min (ref >60)
GFR calc Af Amer: >60 mL/min (ref >60)
GFR calc non Af Amer: 58 mL/min — ABNORMAL LOW (ref >60)
GFR calc non Af Amer: >60 mL/min (ref >60)
Glucose, Bld: 118 mg/dL — ABNORMAL HIGH (ref 70–99)
Glucose, Bld: 148 mg/dL — ABNORMAL HIGH (ref 70–99)
Potassium: 3.5 mmol/L (ref 3.5–5.1)
Potassium: 3.6 mmol/L (ref 3.5–5.1)
Sodium: 138 mmol/L (ref 135–145)
Sodium: 138 mmol/L (ref 135–145)
Total Bilirubin: 0.4 mg/dL (ref 0.3–1.2)
Total Bilirubin: 0.4 mg/dL (ref 0.3–1.2)
Total Protein: 6.6 g/dL (ref 6.5–8.1)
Total Protein: 6.6 g/dL (ref 6.5–8.1)

## 2020-02-26 LAB — CBC
HCT: 34.6 % — ABNORMAL LOW (ref 36.0–46.0)
Hemoglobin: 10.8 g/dL — ABNORMAL LOW (ref 12.0–15.0)
MCH: 28.3 pg (ref 26.0–34.0)
MCHC: 31.2 g/dL (ref 30.0–36.0)
MCV: 90.8 fL (ref 80.0–100.0)
Platelets: 169 10*3/uL (ref 150–400)
RBC: 3.81 MIL/uL — ABNORMAL LOW (ref 3.87–5.11)
RDW: 13.6 % (ref 11.5–15.5)
WBC: 9.3 10*3/uL (ref 4.0–10.5)
nRBC: 0 % (ref 0.0–0.2)

## 2020-02-26 LAB — LIPASE, BLOOD: Lipase: 32 U/L (ref 11–51)

## 2020-02-26 LAB — GLUCOSE, CAPILLARY
Glucose-Capillary: 141 mg/dL — ABNORMAL HIGH (ref 70–99)
Glucose-Capillary: 130 mg/dL — ABNORMAL HIGH (ref 70–99)
Glucose-Capillary: 94 mg/dL (ref 70–99)

## 2020-02-26 LAB — HEMOGLOBIN A1C
Hgb A1c MFr Bld: 6.6 % — ABNORMAL HIGH (ref 4.8–5.6)
Mean Plasma Glucose: 142.72 mg/dL

## 2020-02-26 LAB — SARS CORONAVIRUS 2 BY RT PCR (HOSPITAL ORDER, PERFORMED IN ~~LOC~~ HOSPITAL LAB): SARS Coronavirus 2: NEGATIVE

## 2020-02-26 MED ORDER — ACETAMINOPHEN 650 MG RE SUPP: 650.0000 mg | Freq: Four times a day (QID) | RECTAL | Status: DC | PRN

## 2020-02-26 MED ORDER — ONDANSETRON HCL 4 MG PO TABS: 4.0000 mg | ORAL_TABLET | Freq: Four times a day (QID) | ORAL | Status: DC | PRN

## 2020-02-26 MED ORDER — PROMETHAZINE HCL 25 MG/ML IJ SOLN
12.5000 mg | Freq: Four times a day (QID) | INTRAMUSCULAR | Status: DC | PRN
  Administered 2020-02-26: 12.5 mg via INTRAVENOUS
  Filled 2020-02-26 (×2): qty 1

## 2020-02-26 MED ORDER — ONDANSETRON HCL 4 MG/2ML IJ SOLN
4.0000 mg | Freq: Four times a day (QID) | INTRAMUSCULAR | Status: DC | PRN
  Administered 2020-02-26 (×3): 4 mg via INTRAVENOUS
  Filled 2020-02-26 (×3): qty 2

## 2020-02-26 MED ORDER — CIPROFLOXACIN IN D5W 400 MG/200ML IV SOLN
400.0000 mg | Freq: Once | INTRAVENOUS | Status: AC
  Administered 2020-02-26: 400 mg via INTRAVENOUS
  Filled 2020-02-26: qty 200

## 2020-02-26 MED ORDER — INSULIN ASPART 100 UNIT/ML ~~LOC~~ SOLN
0.0000 [IU] | Freq: Four times a day (QID) | SUBCUTANEOUS | Status: DC
  Administered 2020-02-26: 1 [IU] via SUBCUTANEOUS

## 2020-02-26 MED ORDER — METRONIDAZOLE IN NACL 5-0.79 MG/ML-% IV SOLN
500.0000 mg | Freq: Once | INTRAVENOUS | Status: AC
  Administered 2020-02-26: 500 mg via INTRAVENOUS
  Filled 2020-02-26: qty 100

## 2020-02-26 MED ORDER — CIPROFLOXACIN IN D5W 400 MG/200ML IV SOLN
400.0000 mg | Freq: Two times a day (BID) | INTRAVENOUS | Status: DC
  Administered 2020-02-26 – 2020-02-28 (×4): 400 mg via INTRAVENOUS
  Filled 2020-02-26 (×4): qty 200

## 2020-02-26 MED ORDER — PANTOPRAZOLE SODIUM 40 MG PO TBEC
40.0000 mg | DELAYED_RELEASE_TABLET | Freq: Every day | ORAL | Status: DC
  Administered 2020-02-26 – 2020-02-28 (×3): 40 mg via ORAL
  Filled 2020-02-26 (×3): qty 1

## 2020-02-26 MED ORDER — HYDROMORPHONE HCL 1 MG/ML IJ SOLN
1.0000 mg | INTRAMUSCULAR | Status: DC | PRN
  Administered 2020-02-26 (×2): 1 mg via INTRAVENOUS
  Filled 2020-02-26 (×2): qty 1

## 2020-02-26 MED ORDER — LEVOTHYROXINE SODIUM 100 MCG/5ML IV SOLN
50.0000 ug | Freq: Every day | INTRAVENOUS | Status: DC
  Administered 2020-02-26 – 2020-02-28 (×3): 50 ug via INTRAVENOUS
  Filled 2020-02-26 (×3): qty 5

## 2020-02-26 MED ORDER — HYDRALAZINE HCL 20 MG/ML IJ SOLN: 10.0000 mg | Freq: Four times a day (QID) | INTRAMUSCULAR | Status: DC | PRN

## 2020-02-26 MED ORDER — METRONIDAZOLE IN NACL 5-0.79 MG/ML-% IV SOLN
500.0000 mg | Freq: Three times a day (TID) | INTRAVENOUS | Status: DC
  Administered 2020-02-26 – 2020-02-28 (×7): 500 mg via INTRAVENOUS
  Filled 2020-02-26 (×7): qty 100

## 2020-02-26 MED ORDER — ENOXAPARIN SODIUM 40 MG/0.4ML ~~LOC~~ SOLN
40.0000 mg | SUBCUTANEOUS | Status: DC
  Administered 2020-02-26 – 2020-02-28 (×3): 40 mg via SUBCUTANEOUS
  Filled 2020-02-26 (×3): qty 0.4

## 2020-02-26 MED ORDER — ACETAMINOPHEN 325 MG PO TABS: 650.0000 mg | ORAL_TABLET | Freq: Four times a day (QID) | ORAL | Status: DC | PRN

## 2020-02-26 NOTE — Progress Notes (Signed)
Patient seen and examined. Admitted after midnight with complaints of abd pain, nausea and vomiting. Work up demonstrating acute enterocolitis presumed to be infectious. Hemodynamically stable and in no major distress currently. Please refer to H&P written by Dr, Sharl Ma for further info/details on admission.  Plan: -continue IVF's and supportive care -PRN antiemetics -continue IV antibiotics -will allow CLD and follow clinical response.  Vassie Loll MD [phone]

## 2020-02-26 NOTE — Plan of Care (Signed)

## 2020-02-26 NOTE — ED Provider Notes (Signed)
Patient left at change of shift to get the results of her CT scan.  I had actually seen her on the 25th for similar and her CT at that time did not show anything acute.  After reviewing the results of her CT scan she was started on IV Cipro and IV Flagyl.  2:50 AM I talked to the patient about her CT result.  She has had 2 ED visits within 48 hours, she will probably need to be admitted to get stabilized.  She is agreeable.  3:05 AM Dr. Sharl Ma, hospitalist will admit   CT Abdomen Pelvis W Contrast  Result Date: 02/26/2020 CLINICAL DATA:  Bowel obstruction suspected, previously seen for similar symptoms now worsening with persistent vomiting and abdominal pain EXAM: CT ABDOMEN AND PELVIS WITH CONTRAST TECHNIQUE: Multidetector CT imaging of the abdomen and pelvis was performed using the standard protocol following bolus administration of intravenous contrast. CONTRAST:  85mL OMNIPAQUE IOHEXOL 300 MG/ML  SOLN COMPARISON:  CT 02/24/2020 FINDINGS: Lower chest: Lung bases are clear. Normal heart size. No pericardial effusion. Coronary artery calcifications are present. Hepatobiliary: Tiny 9 mm subcapsular hypoattenuating focus along the right hepatic dome, probable cyst though too small to fully characterize on this exam. No suspicious hepatic lesions. Smooth surface contour. Normal attenuation. Layering attenuation within the gallbladder could reflect vicarious extravasation of contrast media administered previously. No biliary ductal dilatation or calcified gallstones. Pancreas: Partial fatty replacement of the pancreas. No inflammation or ductal dilatation. No discernible lesions. Spleen: Normal in size without focal abnormality. Adrenals/Urinary Tract: No concerning adrenal lesions. Mild hazy adrenal stranding, could reflect mild adrenal congestion. Kidneys enhance and excrete symmetrically. Fluid attenuation cyst again seen in the upper pole right kidney. Additional subcentimeter hypoattenuating foci in the  right kidney too small to fully characterize on CT imaging but statistically likely benign. No concerning renal lesions. No urolithiasis or hydronephrosis. Urinary bladder is unremarkable. Stomach/Bowel: Small hiatal hernia. Distal esophageal wall thickening, similar to prior. Small hiatal hernia. Distal stomach and duodenum are unremarkable. No proximal small bowel thickening or dilatation. There are fluid-filled loops of distal small bowel and mild thickening involving the terminal ileum and cecum with extensive surrounding phlegmonous change though no pneumatosis, extraluminal gas, organized collection or abscess is seen. The appendix is surgically absent. Distal colon has a more normal appearance. Vascular/Lymphatic: Atherosclerotic calcifications throughout the abdominal aorta and branch vessels. No aneurysm or ectasia. No enlarged abdominopelvic lymph nodes. Reproductive: Numerous calcified uterine fibroids. Redemonstration of a 4.1 cm cystic structure in the left ovary, similar to prior. No other adnexal lesions. Other: Trace volume of simple free fluid in the deep pelvis/rectal pouch, likely redistributed/reactive. No abdominopelvic free air. No bowel containing hernias. Musculoskeletal: Prior L3-L5 posterior and interbody fusion without acute hardware complication. No acute injuries nor worrisome or suspicious osseous lesions. IMPRESSION: 1. Fluid-filled loops of distal small bowel and mild thickening involving the terminal ileum and cecum with extensive surrounding phlegmonous change. Findings are most consistent with enterocolitis, either infectious or inflammatory in etiology. No pneumatosis, extraluminal gas, organized collection or abscess is seen. 2. Trace volume simple free fluid in the deep pelvis/rectal pouch, likely redistributed/reactive. 3. Small hiatal hernia with distal esophageal wall thickening, similar to prior. Correlate for features of esophagitis. 4. Fibroid uterus. 5. Stable 4.1 cm  cystic structure in the left ovary. Recommend further evaluation with nonemergent outpatient pelvic ultrasound. This recommendation follows ACR consensus guidelines: White Paper of the ACR Incidental Findings Committee II on Adnexal Findings. J Am Coll Radiol  [id number]:[id number]. 6. Prior L3-L5 posterior and interbody fusion without acute hardware complication. 7. Aortic Atherosclerosis (ICD10-I70.0). Electronically Signed   By: Lovena Le M.D.   On: 02/26/2020 01:52   CT Abdomen Pelvis W Contrast  Result Date: 02/24/2020 CLINICAL DATA:  Vomiting.  Nausea.Marland Kitchen IMPRESSION: 1. Small hiatal hernia with mild distal esophageal wall thickening, can be seen with reflux or esophagitis. 2. A 4.2 cm left ovarian cyst was not seen on prior exam. Recommend nonemergent pelvic ultrasound for characterization. 3. Colonic diverticulosis without diverticulitis. Aortic Atherosclerosis (ICD10-I70.0). Electronically Signed   By: Keith Rake M.D.   On: 02/24/2020 03:53   Diagnoses that have been ruled out:  None  Diagnoses that are still under consideration:  None  Final diagnoses:  Acute gastritis without hemorrhage, unspecified gastritis type  Epigastric pain  Enterocolitis   Plan admission  Rolland Porter, MD, Barbette Or, MD 02/26/20 [phone]

## 2020-02-26 NOTE — H&P (Addendum)
TRH H&P    Patient Demographics:    Cynthia Mays, is a 79 y.o. female  MRN: [id number]  DOB - 11-17-1940  Admit Date - 02/25/2020  Referring MD/NP/PA: Dr. Lynelle Doctor  Outpatient Primary MD for the patient is Junie Spencer, FNP  Patient coming from: Home  Chief complaint-abdominal pain   HPI:    Cynthia Mays  is a 79 y.o. female, with medical history of diabetes mellitus type 2, GERD, hypertension, hypothyroidism came to ED with complaints of abdominal pain.  Patient states that this was associated with multiple episodes of vomiting.  Denies any blood in the vomitus.  Denies diarrhea.  Patient was seen in the ED on June 24 at that time she was discharged home early morning on June 25.  At that time CT scan was without any significant abnormality. Today patient again developed abdominal pain and came to ED for further evaluation. CT abdomen pelvis shows findings consistent with enterocolitis, patient started on Cipro and Flagyl. She denies chest pain or shortness of breath. In the ED her O2 sats were down to 87% on room air and she was started on 2 L/min of oxygen She denies fever or chills No previous history of stroke or seizures    Review of systems:    In addition to the HPI above,    All other systems reviewed and are negative.    Past History of the following :    Past Medical History:  Diagnosis Date  . Complication of anesthesia   . Depression   . Diabetes mellitus   . Fibromyalgia   . GERD (gastroesophageal reflux disease)   . Hiatal hernia   . Hyperlipidemia   . Hypertension   . Hypothyroidism   . Hypothyroidism 07/23/2016  . PONV (postoperative nausea and vomiting)       Past Surgical History:  Procedure Laterality Date  . APPENDECTOMY  2002  . arthroscopy  right 2002, left 2007   bilateral knees  . BACK SURGERY  10/2007, 09/2008   x2  . BIOPSY  10/02/2016   Procedure:  BIOPSY;  Surgeon: Malissa Hippo, MD;  Location: [facility];  Service: Endoscopy;;  gastric  . CARPAL TUNNEL RELEASE  1986   bilateral  . CESAREAN SECTION  1969, 1967   x2  . ESOPHAGOGASTRODUODENOSCOPY N/A 10/02/2016   Procedure: ESOPHAGOGASTRODUODENOSCOPY (EGD);  Surgeon: Malissa Hippo, MD;  Location: [facility];  Service: Endoscopy;  Laterality: N/A;  11:15 - moved to 2/1 @ 3:00  . KNEE SURGERY     both      Social History:      Social History   Tobacco Use  . Smoking status: Former Smoker    Packs/day: 1.00    Years: 43.00    Pack years: 43.00    Types: Cigarettes  . Smokeless tobacco: Never Used  . Tobacco comment: > 20 years quit  Substance Use Topics  . Alcohol use: No    Comment: occasionally wine       Family History :  Family History  Problem Relation Age of Onset  . Anesthesia problems Mother   . CAD Mother 14  . Cancer Mother        Pancreatic cancer  . Hypertension Father   . Suicidality Father        gunshot  . Deafness Sister   . Heart disease Brother   . Suicidality Son   . Cancer Sister        female   . Suicidality Brother   . Hypotension Neg Hx   . Pseudochol deficiency Neg Hx   . Malignant hyperthermia Neg Hx       Home Medications:   Prior to Admission medications   Medication Sig Start Date End Date Taking? Authorizing Provider  clobetasol cream (TEMOVATE) 2.40 % Apply 1 application topically daily as needed (rash). 02/28/19   Florian Buff, MD  famotidine (PEPCID) 40 MG tablet Take 1 tablet (40 mg total) by mouth at bedtime. 12/28/19   Rehman, Mechele Dawley, MD  fexofenadine (ALLEGRA) 180 MG tablet Take 180 mg by mouth daily.    [provider]  fluticasone (FLONASE) 50 MCG/ACT nasal spray Place 2 sprays into the nose daily as needed for allergies.     [provider]  HYDROcodone-acetaminophen (NORCO/VICODIN) 5-325 MG tablet Take 1 tablet by mouth every 6 (six) hours as needed for moderate pain.    [provider]  levothyroxine (EUTHYROX) 112 MCG tablet TAKE 1 TABLET BY MOUTH ONCE DAILY BEFORE BREAKFAST 07/21/19   Baruch Gouty, FNP  lisinopril (ZESTRIL) 10 MG tablet Take 0.5 tablets (5 mg total) by mouth daily. 10/24/19 01/22/20  Baruch Gouty, FNP  metFORMIN (GLUCOPHAGE) 500 MG tablet Take 1 tablet (500 mg total) by mouth 2 (two) times daily. 04/25/19 10/24/19  Baruch Gouty, FNP  Multiple Vitamins-Minerals (MULTIVITAMINS THER. W/MINERALS) TABS Take 1 tablet by mouth daily.      [provider]  naproxen sodium (ALEVE) 220 MG tablet Take 220 mg by mouth daily as needed.    [provider]  NIFEdipine (PROCARDIA XL/NIFEDICAL XL) 60 MG 24 hr tablet Take 1 tablet (60 mg total) by mouth daily. 01/24/20 04/23/20  Evelina Dun A, FNP  ondansetron (ZOFRAN) 4 MG tablet Take 1 tablet (4 mg total) by mouth every 6 (six) hours. 02/24/20   Rolland Porter, MD  rosuvastatin (CRESTOR) 5 MG tablet Take 1 tablet (5 mg total) by mouth daily. 02/07/20 05/07/20  Evelina Dun A, FNP  sertraline (ZOLOFT) 50 MG tablet TAKE 1 & 1/2 (ONE & ONE-HALF) TABLETS BY MOUTH ONCE DAILY 01/24/20   Sharion Balloon, FNP     Allergies:     Allergies  Allergen Reactions  . Codeine Other (See Comments)    Headache, gi upset  . Penicillins Rash    Has patient had a PCN reaction causing immediate rash, facial/tongue/throat swelling, SOB or lightheadedness with hypotension: Yes Has patient had a PCN reaction causing severe rash involving mucus membranes or skin necrosis: No Has patient had a PCN reaction that required hospitalization No Has patient had a PCN reaction occurring within the last 10 years: No If all of the above answers are "NO", then may proceed with Cephalosporin use.   . Pravastatin     Leg cramps     Physical Exam:   Vitals  Blood pressure 128/73, pulse 63, temperature 98.1 F (36.7 C), temperature source Oral, resp. rate (!) 22, height 4\' 11"  (1.499 m), weight 86.2 kg, SpO2 93 %.  1.   General: Appears in no acute distress  2. Psychiatric: Alert, oriented x3, intact insight and judgment  3. Neurologic: Cranial nerves II through XII grossly intact, no focal deficit noted  4. HEENMT:  Atraumatic normocephalic, extraocular muscles are intact  5. Respiratory : Clear to auscultation bilaterally  6. Cardiovascular : S1-S2, regular, no murmur auscultated  7. Gastrointestinal:  Abdomen is soft, tenderness in right lower quadrant, mild guarding, no rigidity, no organomegaly      Data Review:    CBC Recent Labs  Lab 02/23/20 2156 02/25/20 2336  WBC 7.8 7.0  HGB 13.3 11.1*  HCT 41.4 35.7*  PLT 206 167  MCV 89.6 91.8  MCH 28.8 28.5  MCHC 32.1 31.1  RDW 13.4 13.5  LYMPHSABS  --  0.6*  MONOABS  --  0.4  EOSABS  --  0.1  BASOSABS  --  0.0   ------------------------------------------------------------------------------------------------------------------  Results for orders placed or performed during the hospital encounter of 02/25/20 (from the past 48 hour(s))  CBC with Differential/Platelet     Status: Abnormal   Collection Time: 02/25/20 11:36 PM  Result Value Ref Range   WBC 7.0 4.0 - 10.5 K/uL   RBC 3.89 3.87 - 5.11 MIL/uL   Hemoglobin 11.1 (L) 12.0 - 15.0 g/dL   HCT 16.135.7 (L) 36 - 46 %   MCV 91.8 80.0 - 100.0 fL   MCH 28.5 26.0 - 34.0 pg   MCHC 31.1 30.0 - 36.0 g/dL   RDW 09.613.5 04.511.5 - 40.915.5 %   Platelets 167 150 - 400 K/uL   nRBC 0.0 0.0 - 0.2 %   Neutrophils Relative % 84 %   Neutro Abs 5.9 1.7 - 7.7 K/uL   Lymphocytes Relative 9 %   Lymphs Abs 0.6 (L) 0.7 - 4.0 K/uL   Monocytes Relative 6 %   Monocytes Absolute 0.4 0 - 1 K/uL   Eosinophils Relative 1 %   Eosinophils Absolute 0.1 0 - 0 K/uL   Basophils Relative 0 %   Basophils Absolute 0.0 0 - 0 K/uL   Immature Granulocytes 0 %   Abs Immature Granulocytes 0.02 0.00 - 0.07 K/uL    Comment: Performed at [facility], [address] [place], [place] [address]  Comprehensive metabolic panel      Status: Abnormal   Collection Time: 02/25/20 11:36 PM  Result Value Ref Range   Sodium 138 135 - 145 mmol/L   Potassium 3.5 3.5 - 5.1 mmol/L   Chloride 108 98 - 111 mmol/L   CO2 23 22 - 32 mmol/L   Glucose, Bld 118 (H) 70 - 99 mg/dL    Comment: Glucose reference range applies only to samples taken after fasting for at least 8 hours.   BUN 16 8 - 23 mg/dL   Creatinine, Ser 4.780.92 0.44 - 1.00 mg/dL   Calcium 7.9 (L) 8.9 - 10.3 mg/dL   Total Protein 6.6 6.5 - 8.1 g/dL   Albumin 3.4 (L) 3.5 - 5.0 g/dL   AST 44 (H) 15 - 41 U/L   ALT 29 0 - 44 U/L   Alkaline Phosphatase 63 38 - 126 U/L   Total Bilirubin 0.4 0.3 - 1.2 mg/dL   GFR calc non Af Amer >60 >60 mL/min   GFR calc Af Amer >60 >60 mL/min   Anion gap 7 5 - 15    Comment: Performed at [facility], [address] [place], [place] [address]  Lipase, blood     Status: None  Collection Time: 02/25/20 11:36 PM  Result Value Ref Range   Lipase 32 11 - 51 U/L    Comment: Performed at [facility], [address], [place], [place] [address]    Chemistries  Recent Labs  Lab 02/23/20 2156 02/25/20 2336  NA 141 138  K 3.9 3.5  CL 106 108  CO2 24 23  GLUCOSE 159* 118*  BUN 27* 16  CREATININE 1.04* 0.92  CALCIUM 9.0 7.9*  AST 27 44*  ALT 17 29  ALKPHOS 84 63  BILITOT 0.6 0.4   ------------------------------------------------------------------------------------------------------------------  ------------------------------------------------------------------------------------------------------------------ GFR: Estimated Creatinine Clearance: 48.8 mL/min (by C-G formula based on SCr of 0.92 mg/dL). Liver Function Tests: Recent Labs  Lab 02/23/20 2156 02/25/20 2336  AST 27 44*  ALT 17 29  ALKPHOS 84 63  BILITOT 0.6 0.4  PROT 7.9 6.6  ALBUMIN 4.1 3.4*   Recent Labs  Lab 02/23/20 2156 02/25/20 2336  LIPASE 36 32     --------------------------------------------------------------------------------------------------------------- Urine analysis:    Component Value Date/Time   COLORURINE YELLOW 02/24/2020 0130   APPEARANCEUR CLOUDY (A) 02/24/2020 0130   LABSPEC 1.025 02/24/2020 0130   PHURINE 5.0 02/24/2020 0130   GLUCOSEU NEGATIVE 02/24/2020 0130   HGBUR NEGATIVE 02/24/2020 0130   BILIRUBINUR NEGATIVE 02/24/2020 0130   KETONESUR NEGATIVE 02/24/2020 0130   PROTEINUR 30 (A) 02/24/2020 0130   UROBILINOGEN 0.2 07/11/2011 1403   NITRITE NEGATIVE 02/24/2020 0130   LEUKOCYTESUR LARGE (A) 02/24/2020 0130      Imaging Results:    CT Abdomen Pelvis W Contrast  Result Date: 02/26/2020 CLINICAL DATA:  Bowel obstruction suspected, previously seen for similar symptoms now worsening with persistent vomiting and abdominal pain EXAM: CT ABDOMEN AND PELVIS WITH CONTRAST TECHNIQUE: Multidetector CT imaging of the abdomen and pelvis was performed using the standard protocol following bolus administration of intravenous contrast. CONTRAST:  35mL OMNIPAQUE IOHEXOL 300 MG/ML  SOLN COMPARISON:  CT 02/24/2020 FINDINGS: Lower chest: Lung bases are clear. Normal heart size. No pericardial effusion. Coronary artery calcifications are present. Hepatobiliary: Tiny 9 mm subcapsular hypoattenuating focus along the right hepatic dome, probable cyst though too small to fully characterize on this exam. No suspicious hepatic lesions. Smooth surface contour. Normal attenuation. Layering attenuation within the gallbladder could reflect vicarious extravasation of contrast media administered previously. No biliary ductal dilatation or calcified gallstones. Pancreas: Partial fatty replacement of the pancreas. No inflammation or ductal dilatation. No discernible lesions. Spleen: Normal in size without focal abnormality. Adrenals/Urinary Tract: No concerning adrenal lesions. Mild hazy adrenal stranding, could reflect mild adrenal congestion.  Kidneys enhance and excrete symmetrically. Fluid attenuation cyst again seen in the upper pole right kidney. Additional subcentimeter hypoattenuating foci in the right kidney too small to fully characterize on CT imaging but statistically likely benign. No concerning renal lesions. No urolithiasis or hydronephrosis. Urinary bladder is unremarkable. Stomach/Bowel: Small hiatal hernia. Distal esophageal wall thickening, similar to prior. Small hiatal hernia. Distal stomach and duodenum are unremarkable. No proximal small bowel thickening or dilatation. There are fluid-filled loops of distal small bowel and mild thickening involving the terminal ileum and cecum with extensive surrounding phlegmonous change though no pneumatosis, extraluminal gas, organized collection or abscess is seen. The appendix is surgically absent. Distal colon has a more normal appearance. Vascular/Lymphatic: Atherosclerotic calcifications throughout the abdominal aorta and branch vessels. No aneurysm or ectasia. No enlarged abdominopelvic lymph nodes. Reproductive: Numerous calcified uterine fibroids. Redemonstration of a 4.1 cm cystic structure in the left ovary, similar to prior. No other adnexal  lesions. Other: Trace volume of simple free fluid in the deep pelvis/rectal pouch, likely redistributed/reactive. No abdominopelvic free air. No bowel containing hernias. Musculoskeletal: Prior L3-L5 posterior and interbody fusion without acute hardware complication. No acute injuries nor worrisome or suspicious osseous lesions. IMPRESSION: 1. Fluid-filled loops of distal small bowel and mild thickening involving the terminal ileum and cecum with extensive surrounding phlegmonous change. Findings are most consistent with enterocolitis, either infectious or inflammatory in etiology. No pneumatosis, extraluminal gas, organized collection or abscess is seen. 2. Trace volume simple free fluid in the deep pelvis/rectal pouch, likely  redistributed/reactive. 3. Small hiatal hernia with distal esophageal wall thickening, similar to prior. Correlate for features of esophagitis. 4. Fibroid uterus. 5. Stable 4.1 cm cystic structure in the left ovary. Recommend further evaluation with nonemergent outpatient pelvic ultrasound. This recommendation follows ACR consensus guidelines: White Paper of the ACR Incidental Findings Committee II on Adnexal Findings. J Am Coll Radiol [phone]. 6. Prior L3-L5 posterior and interbody fusion without acute hardware complication. 7. Aortic Atherosclerosis (ICD10-I70.0). Electronically Signed   By: Kreg Shropshire M.D.   On: 02/26/2020 01:52      Assessment & Plan:    Active Problems:   Enterocolitis   1. Enterocolitis-CT abdomen pelvis shows fluid-filled loops of distal small bowel and mild thickening involving terminal ileum and cecum with extensive surrounding phlegmonous changes findings consistent with enterocolitis.  No extraluminal gas collection or abscess is seen.  Patient started on Cipro and Flagyl.  We will continue with these antibiotics.  We will keep patient n.p.o.  Dilaudid 1 mg IV every 4 hours as needed for severe abdominal pain. 2. Diabetes mellitus type 2-we will start patient on sliding scale insulin with NovoLog, check CBG every 6 hours. 3. Hypothyroidism-patient takes levothyroxine 112 mcg daily at home.  Will start IV levothyroxine 50 mcg daily. 4. Hypertension-blood pressure is stable.  Hold lisinopril, nifedipine.  Start IV hydralazine 10 mg every 4 hours as needed for BP greater than 160/100. 5. GERD/hiatal hernia-start Protonix 40 mg IV daily.    DVT Prophylaxis-   Lovenox   AM Labs Ordered, also please review Full Orders  Family Communication: Admission, patients condition and plan of care including tests being ordered have been discussed with the patient  who indicate understanding and agree with the plan and Code Status.  Code Status: Full code  Adssion  status: Inpatient :The appropriate admission status for this patient is INPATIENT. Inpatient status is judged to be reasonable and necessary in order to provide the required intensity of service to ensure the patient's safety. The patient's presenting symptoms, physical exam findings, and initial radiographic and laboratory data in the context of their chronic comorbidities is felt to place them at high risk for further clinical deterioration. Furthermore, it is not anticipated that the patient will be medically stable for discharge from the hospital within 2 midnights of admission. The following factors support the admission status of inpatient.     The patient's presenting symptoms include abdominal pain. The worrisome physical exam findings include right lower quadrant tenderness to palpation. The initial radiographic and laboratory data are worrisome because of enterocolitis. The chronic co-morbidities include diabetes mellitus type 2.       * I certify that at the point of admission it is my clinical judgment that the patient will require inpatient hospital care spanning beyond 2 midnights from the point of admission due to high intensity of service, high risk for further deterioration and high frequency of surveillance required.*  Time spent in minutes : 60 minutes   Chamari Cutbirth S Teriah Muela M.D

## 2020-02-27 ENCOUNTER — Ambulatory Visit: Payer: Medicare Other | Admitting: Family

## 2020-02-27 DIAGNOSIS — I10 Essential (primary) hypertension: Secondary | ICD-10-CM

## 2020-02-27 DIAGNOSIS — R0902 Hypoxemia: Secondary | ICD-10-CM

## 2020-02-27 DIAGNOSIS — R1013 Epigastric pain: Secondary | ICD-10-CM

## 2020-02-27 LAB — GLUCOSE, CAPILLARY
Glucose-Capillary: 95 mg/dL (ref 70–99)
Glucose-Capillary: 89 mg/dL (ref 70–99)
Glucose-Capillary: 116 mg/dL — ABNORMAL HIGH (ref 70–99)

## 2020-02-27 MED ORDER — ALUM & MAG HYDROXIDE-SIMETH 200-200-20 MG/5ML PO SUSP
30.0000 mL | Freq: Four times a day (QID) | ORAL | Status: DC | PRN
  Administered 2020-02-28 (×2): 30 mL via ORAL
  Filled 2020-02-27 (×2): qty 30

## 2020-02-27 NOTE — Progress Notes (Signed)
Pt on RA with O2 sat of 86%. Placed pt on 2L [place] with increase in Sat to 91%

## 2020-02-27 NOTE — Progress Notes (Signed)
PROGRESS NOTE    Cynthia Mays  FGH:[id number] DOB: 11-06-1940 DOA: 02/25/2020 PCP: Sharion Balloon, FNP    Chief Complaint  Patient presents with  . Abdominal Pain    Brief Narrative:  As per H&P written by Dr. Darrick Meigs on 02/26/2020 Cynthia Mays  is a 79 y.o. female, with medical history of diabetes mellitus type 2, GERD, hypertension, hypothyroidism came to ED with complaints of abdominal pain.  Patient states that this was associated with multiple episodes of vomiting.  Denies any blood in the vomitus.  Denies diarrhea.  Patient was seen in the ED on June 24 at that time she was discharged home early morning on June 25.  At that time CT scan was without any significant abnormality. Today patient again developed abdominal pain and came to ED for further evaluation. CT abdomen pelvis shows findings consistent with enterocolitis, patient started on Cipro and Flagyl. She denies chest pain or shortness of breath. In the ED her O2 sats were down to 87% on room air and she was started on 2 L/min of oxygen She denies fever or chills No previous history of stroke or seizures  Assessment & Plan: 1-acute enterocolitis -continue PRN antiemetics -continue IVF's and slowly advance diet -continue antibiotics -follow clinical response  2-type 2 diabetes  -continue SSI -follow CBG's  3-HTN -stable overall -continue to follow VS -continue holding oral antihypertensive agents -continue PRN hydralazine  4-hypothyroidism -continue synthroid -resume oral rout dose once able to tolerate Po's.  5-GERD -continue PPI  6-hypoxia -most likely in the setting of reactive airway disease -no signs of fluid overload on exam and no wheezing -continue supportive care and wean off O2 supplementation as tolerated -OHS probably playing a role.  7-class 3 obesity -Body mass index is 40.03 kg/m. -low calorie diet and portion control discussed with patient.   DVT prophylaxis: Lovenox Code  Status: Full code. Family Communication: Daughter updated over the phone on 02/26/2020.  During today's visit no family member available. Disposition:   Status is: Inpatient  Dispo: The patient is from:  Home              Anticipated d/c is to: Home              Anticipated d/c date is: 6/29--6/30              Patient currently no medically stable for discharge, still having intermittent nausea and some abdominal discomfort.  Will attempt advancing diet and transition IV antibiotics to oral route prior for her being able to pursuit outpatient therapy.        Consultants:   Case discussed with Dr. Olene Craven GI; plan is to continue conservative management, IV antibiotics and advancing diet.  If well-tolerated patient can be discharged home with outpatient follow-up; if symptoms fail to improve they will see her while in the hospital.   Procedures:  Below for x-ray reports  Antimicrobials:  Cipro and Flagyl  Subjective: Reports some shortness of breath overnight requiring the use of 2 L nasal supplementation.  Now having vomiting, but reporting some nausea overnight and also reports improvement in her abdominal pain.  Objective: Vitals:   02/27/20 0525 02/27/20 0835 02/27/20 0839 02/27/20 1433  BP: (!) 136/56   139/76  Pulse: (!) 55   (!) 51  Resp: 20   18  Temp: 98.2 F (36.8 C)   98.1 F (36.7 C)  TempSrc: Oral   Oral  SpO2: 91% (!) 86% 91% 95%  Weight:  Height:        Intake/Output Summary (Last 24 hours) at 02/27/2020 1530 Last data filed at 02/27/2020 1512 Gross per 24 hour  Intake 2805.42 ml  Output --  Net 2805.42 ml   Filed Weights   02/25/20 1922 02/26/20 0300  Weight: 86.2 kg 89.9 kg    Examination: General exam: Appears calm and comfortable, reports no vomiting, but having some nausea overnight, also reported still having mild discomfort in her right lower quadrant.  Patient expressed feeling slightly short of breath overnight and required the use of 2  L nasal cannula supplementation (O2 sat down to 86%). Respiratory system: Positive scattered rhonchi, no wheezing, no crackles, no using accessory muscles. Cardiovascular system: No rubs, no gallops, no murmurs appreciated on exam. Gastrointestinal system: Abdomen is nondistended, soft, no guarding; right lower quadrant discomfort to palpation and, positive bowel sounds appreciated. Central nervous system: Alert and oriented. No focal neurological deficits. Extremities: No cyanosis or clubbing. Skin: No rashes, no petechiae. Psychiatry: Judgement and insight appear normal. Mood & affect appropriate.     Data Reviewed: I have personally reviewed following labs and imaging studies  CBC: Recent Labs  Lab 02/23/20 2156 02/25/20 2336 02/26/20 0457  WBC 7.8 7.0 9.3  NEUTROABS  --  5.9  --   HGB 13.3 11.1* 10.8*  HCT 41.4 35.7* 34.6*  MCV 89.6 91.8 90.8  PLT 206 167 169    Basic Metabolic Panel: Recent Labs  Lab 02/23/20 2156 02/25/20 2336 02/26/20 0457  NA 141 138 138  K 3.9 3.5 3.6  CL 106 108 106  CO2 24 23 24   GLUCOSE 159* 118* 148*  BUN 27* 16 13  CREATININE 1.04* 0.92 0.95  CALCIUM 9.0 7.9* 7.9*    GFR: Estimated Creatinine Clearance: 48.5 mL/min (by C-G formula based on SCr of 0.95 mg/dL).  Liver Function Tests: Recent Labs  Lab 02/23/20 2156 02/25/20 2336 02/26/20 0457  AST 27 44* 42*  ALT 17 29 27   ALKPHOS 84 63 61  BILITOT 0.6 0.4 0.4  PROT 7.9 6.6 6.6  ALBUMIN 4.1 3.4* 3.4*    CBG: Recent Labs  Lab 02/26/20 0557 02/26/20 1623 02/26/20 2305 02/27/20 0549 02/27/20 1118  GLUCAP 141* 130* 94 95 89     Recent Results (from the past 240 hour(s))  Urine culture     Status: Abnormal   Collection Time: 02/24/20  4:21 AM   Specimen: Urine, Clean Catch  Result Value Ref Range Status   Specimen Description   Final    URINE, CLEAN CATCH Performed at [facility], [address] [place], [address] [place]    Special Requests   Final     NONE Performed at [facility], [address], [place], [address] [place]    Culture (A)  Final    30,000 COLONIES/mL MULTIPLE SPECIES PRESENT, SUGGEST RECOLLECTION   Report Status 02/25/2020 FINAL  Final  SARS Coronavirus 2 by RT PCR (hospital order, performed in [facility] hospital lab) Nasopharyngeal Nasopharyngeal Swab     Status: None   Collection Time: 02/26/20  3:25 AM   Specimen: Nasopharyngeal Swab  Result Value Ref Range Status   SARS Coronavirus 2 NEGATIVE NEGATIVE Final    Comment: (NOTE) SARS-CoV-2 target nucleic acids are NOT DETECTED.  The SARS-CoV-2 RNA is generally detectable in upper and lower respiratory specimens during the acute phase of infection. The lowest concentration of SARS-CoV-2 viral copies this assay can detect is 250 copies / mL. A negative result does not  preclude SARS-CoV-2 infection and should not be used as the sole basis for treatment or other patient management decisions.  A negative result may occur with improper specimen collection / handling, submission of specimen other than nasopharyngeal swab, presence of viral mutation(s) within the areas targeted by this assay, and inadequate number of viral copies (<250 copies / mL). A negative result must be combined with clinical observations, patient history, and epidemiological information.  Fact Sheet for Patients:   BoilerBrush.com.cy  Fact Sheet for Healthcare Providers: https://pope.com/  This test is not yet approved or  cleared by the [place] FDA and has been authorized for detection and/or diagnosis of SARS-CoV-2 by FDA under an Emergency Use Authorization (EUA).  This EUA will remain in effect (meaning this test can be used) for the duration of the COVID-19 declaration under Section 564(b)(1) of the Act, 21 U.S.C. section 360bbb-3(b)(1), unless the authorization is terminated or revoked sooner.  Performed at [facility], [address], [place], [place] [address]      Radiology Studies: CT Abdomen Pelvis W Contrast  Result Date: 02/26/2020 CLINICAL DATA:  Bowel obstruction suspected, previously seen for similar symptoms now worsening with persistent vomiting and abdominal pain EXAM: CT ABDOMEN AND PELVIS WITH CONTRAST TECHNIQUE: Multidetector CT imaging of the abdomen and pelvis was performed using the standard protocol following bolus administration of intravenous contrast. CONTRAST:  56mL OMNIPAQUE IOHEXOL 300 MG/ML  SOLN COMPARISON:  CT 02/24/2020 FINDINGS: Lower chest: Lung bases are clear. Normal heart size. No pericardial effusion. Coronary artery calcifications are present. Hepatobiliary: Tiny 9 mm subcapsular hypoattenuating focus along the right hepatic dome, probable cyst though too small to fully characterize on this exam. No suspicious hepatic lesions. Smooth surface contour. Normal attenuation. Layering attenuation within the gallbladder could reflect vicarious extravasation of contrast media administered previously. No biliary ductal dilatation or calcified gallstones. Pancreas: Partial fatty replacement of the pancreas. No inflammation or ductal dilatation. No discernible lesions. Spleen: Normal in size without focal abnormality. Adrenals/Urinary Tract: No concerning adrenal lesions. Mild hazy adrenal stranding, could reflect mild adrenal congestion. Kidneys enhance and excrete symmetrically. Fluid attenuation cyst again seen in the upper pole right kidney. Additional subcentimeter hypoattenuating foci in the right kidney too small to fully characterize on CT imaging but statistically likely benign. No concerning renal lesions. No urolithiasis or hydronephrosis. Urinary bladder is unremarkable. Stomach/Bowel: Small hiatal hernia. Distal esophageal wall thickening, similar to prior. Small hiatal hernia. Distal stomach and duodenum are unremarkable. No proximal small bowel thickening or dilatation. There are fluid-filled  loops of distal small bowel and mild thickening involving the terminal ileum and cecum with extensive surrounding phlegmonous change though no pneumatosis, extraluminal gas, organized collection or abscess is seen. The appendix is surgically absent. Distal colon has a more normal appearance. Vascular/Lymphatic: Atherosclerotic calcifications throughout the abdominal aorta and branch vessels. No aneurysm or ectasia. No enlarged abdominopelvic lymph nodes. Reproductive: Numerous calcified uterine fibroids. Redemonstration of a 4.1 cm cystic structure in the left ovary, similar to prior. No other adnexal lesions. Other: Trace volume of simple free fluid in the deep pelvis/rectal pouch, likely redistributed/reactive. No abdominopelvic free air. No bowel containing hernias. Musculoskeletal: Prior L3-L5 posterior and interbody fusion without acute hardware complication. No acute injuries nor worrisome or suspicious osseous lesions. IMPRESSION: 1. Fluid-filled loops of distal small bowel and mild thickening involving the terminal ileum and cecum with extensive surrounding phlegmonous change. Findings are most consistent with enterocolitis, either infectious or inflammatory in etiology. No pneumatosis, extraluminal gas, organized  collection or abscess is seen. 2. Trace volume simple free fluid in the deep pelvis/rectal pouch, likely redistributed/reactive. 3. Small hiatal hernia with distal esophageal wall thickening, similar to prior. Correlate for features of esophagitis. 4. Fibroid uterus. 5. Stable 4.1 cm cystic structure in the left ovary. Recommend further evaluation with nonemergent outpatient pelvic ultrasound. This recommendation follows ACR consensus guidelines: White Paper of the ACR Incidental Findings Committee II on Adnexal Findings. J Am Coll Radiol [phone]. 6. Prior L3-L5 posterior and interbody fusion without acute hardware complication. 7. Aortic Atherosclerosis (ICD10-I70.0). Electronically Signed    By: Kreg Shropshire M.D.   On: 02/26/2020 01:52    Scheduled Meds: . enoxaparin (LOVENOX) injection  40 mg Subcutaneous Q24H  . insulin aspart  0-9 Units Subcutaneous Q6H  . levothyroxine  50 mcg Intravenous Daily  . pantoprazole  40 mg Oral Q1200   Continuous Infusions: . sodium chloride 75 mL/hr at 02/26/20 2117  . ciprofloxacin 400 mg (02/27/20 1436)  . metronidazole 500 mg (02/27/20 1237)     LOS: 1 day    Time spent: 35 minutes.   Vassie Loll, MD [facility]   To contact the attending provider between 7A-7P or the covering provider during after hours 7P-7A, please log into the web site [email or web address] and access using universal [place] password for that web site. If you do not have the password, please call the hospital operator.  02/27/2020, 3:30 PM

## 2020-02-28 DIAGNOSIS — R1013 Epigastric pain: Secondary | ICD-10-CM

## 2020-02-28 DIAGNOSIS — E039 Hypothyroidism, unspecified: Secondary | ICD-10-CM

## 2020-02-28 DIAGNOSIS — E119 Type 2 diabetes mellitus without complications: Secondary | ICD-10-CM

## 2020-02-28 LAB — GLUCOSE, CAPILLARY
Glucose-Capillary: 102 mg/dL — ABNORMAL HIGH (ref 70–99)
Glucose-Capillary: 83 mg/dL (ref 70–99)
Glucose-Capillary: 88 mg/dL (ref 70–99)
Glucose-Capillary: 90 mg/dL (ref 70–99)

## 2020-02-28 LAB — BASIC METABOLIC PANEL
Anion gap: 8 (ref 5–15)
BUN: 9 mg/dL (ref 8–23)
CO2: 24 mmol/L (ref 22–32)
Calcium: 8 mg/dL — ABNORMAL LOW (ref 8.9–10.3)
Chloride: 110 mmol/L (ref 98–111)
Creatinine, Ser: 0.94 mg/dL (ref 0.44–1.00)
GFR calc Af Amer: >60 mL/min (ref >60)
GFR calc non Af Amer: 59 mL/min — ABNORMAL LOW (ref >60)
Glucose, Bld: 98 mg/dL (ref 70–99)
Potassium: 3.3 mmol/L — ABNORMAL LOW (ref 3.5–5.1)
Sodium: 142 mmol/L (ref 135–145)

## 2020-02-28 LAB — CBC
HCT: 31.1 % — ABNORMAL LOW (ref 36.0–46.0)
Hemoglobin: 9.9 g/dL — ABNORMAL LOW (ref 12.0–15.0)
MCH: 29 pg (ref 26.0–34.0)
MCHC: 31.8 g/dL (ref 30.0–36.0)
MCV: 91.2 fL (ref 80.0–100.0)
Platelets: 152 10*3/uL (ref 150–400)
RBC: 3.41 MIL/uL — ABNORMAL LOW (ref 3.87–5.11)
RDW: 13.6 % (ref 11.5–15.5)
WBC: 5.2 10*3/uL (ref 4.0–10.5)
nRBC: 0 % (ref 0.0–0.2)

## 2020-02-28 MED ORDER — CIPROFLOXACIN HCL 500 MG PO TABS
500.0000 mg | ORAL_TABLET | Freq: Two times a day (BID) | ORAL | 0 refills | Status: AC
Start: 2020-02-28 — End: 2020-03-06

## 2020-02-28 MED ORDER — POTASSIUM CHLORIDE CRYS ER 20 MEQ PO TBCR
40.0000 meq | EXTENDED_RELEASE_TABLET | Freq: Once | ORAL | Status: AC
  Administered 2020-02-28: 40 meq via ORAL
  Filled 2020-02-28: qty 2

## 2020-02-28 MED ORDER — METRONIDAZOLE 500 MG PO TABS
500.0000 mg | ORAL_TABLET | Freq: Three times a day (TID) | ORAL | 0 refills | Status: AC
Start: 2020-02-28 — End: 2020-03-06

## 2020-02-28 MED ORDER — ONDANSETRON 8 MG PO TBDP
8.0000 mg | ORAL_TABLET | Freq: Three times a day (TID) | ORAL | 0 refills | Status: DC | PRN
Start: 2020-02-28 — End: 2021-09-19

## 2020-02-28 NOTE — Progress Notes (Signed)
Nsg Discharge Note  Admit Date:  02/25/2020 Discharge date: 02/28/2020   Cynthia Mays to be D/C'd Home per MD order.  AVS completed.  Copy for chart, and copy for patient signed, and dated. Patient/caregiver able to verbalize understanding.  Discharge Medication: Allergies as of 02/28/2020      Reactions   Codeine Other (See Comments)   Headache, gi upset   Penicillins Rash   Has patient had a PCN reaction causing immediate rash, facial/tongue/throat swelling, SOB or lightheadedness with hypotension: Yes Has patient had a PCN reaction causing severe rash involving mucus membranes or skin necrosis: No Has patient had a PCN reaction that required hospitalization No Has patient had a PCN reaction occurring within the last 10 years: No If all of the above answers are "NO", then may proceed with Cephalosporin use.   Pravastatin    Leg cramps      Medication List    STOP taking these medications   naproxen sodium 220 MG tablet Commonly known as: ALEVE     TAKE these medications   cholecalciferol 25 MCG (1000 UNIT) tablet Commonly known as: VITAMIN D3 Take 1,000 Units by mouth daily.   ciprofloxacin 500 MG tablet Commonly known as: Cipro Take 1 tablet (500 mg total) by mouth 2 (two) times daily for 7 days.   clobetasol cream 0.05 % Commonly known as: TEMOVATE Apply 1 application topically daily as needed (rash).   diazepam 5 MG tablet Commonly known as: VALIUM Take 5 mg by mouth every 6 (six) hours as needed for anxiety or muscle spasms.   famotidine 40 MG tablet Commonly known as: PEPCID Take 1 tablet (40 mg total) by mouth at bedtime.   fexofenadine 180 MG tablet Commonly known as: ALLEGRA Take 180 mg by mouth daily.   fluticasone 50 MCG/ACT nasal spray Commonly known as: FLONASE Place 2 sprays into the nose daily as needed for allergies.   HYDROcodone-acetaminophen 5-325 MG tablet Commonly known as: NORCO/VICODIN Take 1 tablet by mouth every 6 (six) hours  as needed for moderate pain.   levothyroxine 112 MCG tablet Commonly known as: Euthyrox TAKE 1 TABLET BY MOUTH ONCE DAILY BEFORE BREAKFAST   lisinopril 10 MG tablet Commonly known as: ZESTRIL Take 0.5 tablets (5 mg total) by mouth daily.   metFORMIN 500 MG tablet Commonly known as: GLUCOPHAGE Take 1 tablet (500 mg total) by mouth 2 (two) times daily.   metroNIDAZOLE 500 MG tablet Commonly known as: Flagyl Take 1 tablet (500 mg total) by mouth 3 (three) times daily for 7 days.   multivitamins ther. w/minerals Tabs tablet Take 1 tablet by mouth daily.   NIFEdipine 60 MG 24 hr tablet Commonly known as: PROCARDIA XL/NIFEDICAL XL Take 1 tablet (60 mg total) by mouth daily.   ondansetron 4 MG tablet Commonly known as: ZOFRAN Take 1 tablet (4 mg total) by mouth every 6 (six) hours.   ondansetron 8 MG disintegrating tablet Commonly known as: Zofran ODT Take 1 tablet (8 mg total) by mouth every 8 (eight) hours as needed for nausea or vomiting.   pantoprazole 40 MG tablet Commonly known as: PROTONIX Take 40 mg by mouth 2 (two) times daily.   rosuvastatin 5 MG tablet Commonly known as: CRESTOR Take 1 tablet (5 mg total) by mouth daily.   sertraline 50 MG tablet Commonly known as: ZOLOFT TAKE 1 & 1/2 (ONE & ONE-HALF) TABLETS BY MOUTH ONCE DAILY What changed:   how much to take  how to take this  when to take this       Discharge Assessment: Vitals:   02/28/20 0507 02/28/20 0755  BP: (!) 141/47   Pulse: (!) 47   Resp: 18   Temp: 98 F (36.7 C)   SpO2: 94% 95%   Skin clean, dry and intact without evidence of skin break down, no evidence of skin tears noted. IV catheter discontinued intact. Site without signs and symptoms of complications - no redness or edema noted at insertion site, patient denies c/o pain - only slight tenderness at site.  Dressing with slight pressure applied.  D/c Instructions-Education: Discharge instructions given to patient/family with  verbalized understanding. D/c education completed with patient/family including follow up instructions, medication list, d/c activities limitations if indicated, with other d/c instructions as indicated by MD - patient able to verbalize understanding, all questions fully answered. Patient instructed to return to ED, call 911, or call MD for any changes in condition.  Patient escorted via WC, and D/C home via private auto.  Brandy Hale, LPN 6/46/8032 1:22 PM

## 2020-02-28 NOTE — Discharge Summary (Signed)
Physician Discharge Summary  DARCHELLE NUNES PJA:[id number] DOB: [birth year] DOA: 02/25/2020  PCP: Junie Spencer, FNP  Admit date: 02/25/2020 Discharge date: 02/28/2020  Time spent: 35 minutes  Recommendations for Outpatient Follow-up:  1. Basic metabolic panel to follow electrolytes and renal function 2. Reassess blood pressure and adjust antihypertensive regimen as needed 3. Assure complete resolution of patient's symptoms and make sure that she has follow-up with gastroenterology as discussed.   Discharge Diagnoses:  Enterocolitis Epigastric pain GERD HTN Hypothyroidism Essential hypertension Obesity, Class III, BMI 40-49.9 (morbid obesity) (HCC)   Discharge Condition: Stable and improved.  Discharged home with instruction to follow-up with PCP and gastroenterology after discharge.  CODE STATUS: Full code  Diet recommendation: Heart healthy, low calorie and modified carbohydrates.  Filed Weights   02/25/20 1922 02/26/20 0300  Weight: 86.2 kg 89.9 kg    History of present illness:  As per H&P written by Dr. Sharl Ma on 02/26/2020 CharlotteStewartis a78 y.o.female,with medical history of diabetes mellitus type 2, GERD, hypertension, hypothyroidism came to ED with complaints of abdominal pain. Patient states that this was associated with multiple episodes of vomiting. Denies any blood in the vomitus. Denies diarrhea. Patient was seen in the ED on June 24 at that time she was discharged home early morning on June 25. At that time CT scan was without any significant abnormality. Today patient again developed abdominal pain and came to ED for further evaluation. CT abdomen pelvis shows findings consistent with enterocolitis, patient started on Cipro and Flagyl. She denies chest pain or shortness of breath. In the ED her O2 sats were down to 87% on room air and she was started on 2 L/min of oxygen She denies fever or chills No previous history of stroke or  seizures  Hospital Course:  1-acute enterocolitis -continue PRN antiemetics -Advised to keep yourself well-hydrated Complete 7 more days of oral antibiotics at discharge -Outpatient follow-up with gastroenterology service. -Lifestyle changes and type of diet discussed with patient.  2-type 2 diabetes  -Instructed to follow modified carbohydrate diet Resume home hypoglycemic regimen.  3-HTN -stable overall -Advised to follow heart healthy diet -Resume oral antihypertensive agents.  4-hypothyroidism -continue synthroid -Repeat thyroid panel as an outpatient.  5-GERD -continue PPI and nightly famotidine  6-hypoxia -most likely in the setting of reactive airway disease -no signs of fluid overload on exam and no wheezing. -OHS probably playing a role. -At discharge no oxygen supplementation required. -Patient denies chest pain or shortness of breath.  7-class 3 obesity -Body mass index is 40.03 kg/m. -low calorie diet and portion control discussed with patient.  Procedures: See below for x-ray reports.  Consultations:  GI curbside: Case discussed with Dr. Barrie Lyme GI; plan is to continue conservative management, IV antibiotics and advancing diet.  If well-tolerated patient can be discharged home with outpatient follow-up; if symptoms fail to improve they will see her while in the hospital.  Discharge Exam: Vitals:   02/28/20 0507 02/28/20 0755  BP: (!) 141/47   Pulse: (!) 47   Resp: 18   Temp: 98 F (36.7 C)   SpO2: 94% 95%    General: Afebrile, no abdominal pain, no nausea or vomiting; tolerating soft diet without problems.  Reports still some loose stools, but no blood and is able to keep up with oral intake. Cardiovascular: S1 and S2, no rubs, no gallops, no JVD Respiratory: Good air movement bilaterally, no wheezing, no crackles, no requiring oxygen supplementation. Abdomen: Soft, obese, nontender, distended, positive bowel sounds. Extremities:  No  cyanosis, no clubbing.   Discharge Instructions   Discharge Instructions    Diet - low sodium heart healthy   Complete by: As directed    Diet Carb Modified   Complete by: As directed    Discharge instructions   Complete by: As directed    Take medications as prescribed. Maintain adequate hydration. Arrange follow-up with PCP in 10 days Follow-up with gastroenterology service (office will contact you with appointment details). Follow-up discussed instructions and recommendations for type of diet and further lifestyle changes for reflux.     Allergies as of 02/28/2020      Reactions   Codeine Other (See Comments)   Headache, gi upset   Penicillins Rash   Has patient had a PCN reaction causing immediate rash, facial/tongue/throat swelling, SOB or lightheadedness with hypotension: Yes Has patient had a PCN reaction causing severe rash involving mucus membranes or skin necrosis: No Has patient had a PCN reaction that required hospitalization No Has patient had a PCN reaction occurring within the last 10 years: No If all of the above answers are "NO", then may proceed with Cephalosporin use.   Pravastatin    Leg cramps      Medication List    STOP taking these medications   naproxen sodium 220 MG tablet Commonly known as: ALEVE     TAKE these medications   cholecalciferol 25 MCG (1000 UNIT) tablet Commonly known as: VITAMIN D3 Take 1,000 Units by mouth daily.   ciprofloxacin 500 MG tablet Commonly known as: Cipro Take 1 tablet (500 mg total) by mouth 2 (two) times daily for 7 days.   clobetasol cream 0.05 % Commonly known as: TEMOVATE Apply 1 application topically daily as needed (rash).   diazepam 5 MG tablet Commonly known as: VALIUM Take 5 mg by mouth every 6 (six) hours as needed for anxiety or muscle spasms.   famotidine 40 MG tablet Commonly known as: PEPCID Take 1 tablet (40 mg total) by mouth at bedtime.   fexofenadine 180 MG tablet Commonly known as:  ALLEGRA Take 180 mg by mouth daily.   fluticasone 50 MCG/ACT nasal spray Commonly known as: FLONASE Place 2 sprays into the nose daily as needed for allergies.   HYDROcodone-acetaminophen 5-325 MG tablet Commonly known as: NORCO/VICODIN Take 1 tablet by mouth every 6 (six) hours as needed for moderate pain.   levothyroxine 112 MCG tablet Commonly known as: Euthyrox TAKE 1 TABLET BY MOUTH ONCE DAILY BEFORE BREAKFAST   lisinopril 10 MG tablet Commonly known as: ZESTRIL Take 0.5 tablets (5 mg total) by mouth daily.   metFORMIN 500 MG tablet Commonly known as: GLUCOPHAGE Take 1 tablet (500 mg total) by mouth 2 (two) times daily.   metroNIDAZOLE 500 MG tablet Commonly known as: Flagyl Take 1 tablet (500 mg total) by mouth 3 (three) times daily for 7 days.   multivitamins ther. w/minerals Tabs tablet Take 1 tablet by mouth daily.   NIFEdipine 60 MG 24 hr tablet Commonly known as: PROCARDIA XL/NIFEDICAL XL Take 1 tablet (60 mg total) by mouth daily.   ondansetron 4 MG tablet Commonly known as: ZOFRAN Take 1 tablet (4 mg total) by mouth every 6 (six) hours.   ondansetron 8 MG disintegrating tablet Commonly known as: Zofran ODT Take 1 tablet (8 mg total) by mouth every 8 (eight) hours as needed for nausea or vomiting.   pantoprazole 40 MG tablet Commonly known as: PROTONIX Take 40 mg by mouth 2 (two) times daily.  rosuvastatin 5 MG tablet Commonly known as: CRESTOR Take 1 tablet (5 mg total) by mouth daily.   sertraline 50 MG tablet Commonly known as: ZOLOFT TAKE 1 & 1/2 (ONE & ONE-HALF) TABLETS BY MOUTH ONCE DAILY What changed:   how much to take  how to take this  when to take this      Allergies  Allergen Reactions  . Codeine Other (See Comments)    Headache, gi upset  . Penicillins Rash    Has patient had a PCN reaction causing immediate rash, facial/tongue/throat swelling, SOB or lightheadedness with hypotension: Yes Has patient had a PCN reaction  causing severe rash involving mucus membranes or skin necrosis: No Has patient had a PCN reaction that required hospitalization No Has patient had a PCN reaction occurring within the last 10 years: No If all of the above answers are "NO", then may proceed with Cephalosporin use.   . Pravastatin     Leg cramps    Follow-up Information    Junie Spencer, FNP. Schedule an appointment as soon as possible for a visit in 10 day(s).   Specialty: Family Medicine Contact information: [address] [place] [address] [phone]        Malissa Hippo, MD Follow up.   Specialty: Gastroenterology Why: office will contact you with appointment details.  Contact information: [address], [address] [place] [address] [phone]               The results of significant diagnostics from this hospitalization (including imaging, microbiology, ancillary and laboratory) are listed below for reference.    Significant Diagnostic Studies: CT Abdomen Pelvis W Contrast  Result Date: 02/26/2020 CLINICAL DATA:  Bowel obstruction suspected, previously seen for similar symptoms now worsening with persistent vomiting and abdominal pain EXAM: CT ABDOMEN AND PELVIS WITH CONTRAST TECHNIQUE: Multidetector CT imaging of the abdomen and pelvis was performed using the standard protocol following bolus administration of intravenous contrast. CONTRAST:  89mL OMNIPAQUE IOHEXOL 300 MG/ML  SOLN COMPARISON:  CT 02/24/2020 FINDINGS: Lower chest: Lung bases are clear. Normal heart size. No pericardial effusion. Coronary artery calcifications are present. Hepatobiliary: Tiny 9 mm subcapsular hypoattenuating focus along the right hepatic dome, probable cyst though too small to fully characterize on this exam. No suspicious hepatic lesions. Smooth surface contour. Normal attenuation. Layering attenuation within the gallbladder could reflect vicarious extravasation of contrast media administered previously.  No biliary ductal dilatation or calcified gallstones. Pancreas: Partial fatty replacement of the pancreas. No inflammation or ductal dilatation. No discernible lesions. Spleen: Normal in size without focal abnormality. Adrenals/Urinary Tract: No concerning adrenal lesions. Mild hazy adrenal stranding, could reflect mild adrenal congestion. Kidneys enhance and excrete symmetrically. Fluid attenuation cyst again seen in the upper pole right kidney. Additional subcentimeter hypoattenuating foci in the right kidney too small to fully characterize on CT imaging but statistically likely benign. No concerning renal lesions. No urolithiasis or hydronephrosis. Urinary bladder is unremarkable. Stomach/Bowel: Small hiatal hernia. Distal esophageal wall thickening, similar to prior. Small hiatal hernia. Distal stomach and duodenum are unremarkable. No proximal small bowel thickening or dilatation. There are fluid-filled loops of distal small bowel and mild thickening involving the terminal ileum and cecum with extensive surrounding phlegmonous change though no pneumatosis, extraluminal gas, organized collection or abscess is seen. The appendix is surgically absent. Distal colon has a more normal appearance. Vascular/Lymphatic: Atherosclerotic calcifications throughout the abdominal aorta and branch vessels. No aneurysm or ectasia. No enlarged abdominopelvic lymph nodes.  Reproductive: Numerous calcified uterine fibroids. Redemonstration of a 4.1 cm cystic structure in the left ovary, similar to prior. No other adnexal lesions. Other: Trace volume of simple free fluid in the deep pelvis/rectal pouch, likely redistributed/reactive. No abdominopelvic free air. No bowel containing hernias. Musculoskeletal: Prior L3-L5 posterior and interbody fusion without acute hardware complication. No acute injuries nor worrisome or suspicious osseous lesions. IMPRESSION: 1. Fluid-filled loops of distal small bowel and mild thickening involving  the terminal ileum and cecum with extensive surrounding phlegmonous change. Findings are most consistent with enterocolitis, either infectious or inflammatory in etiology. No pneumatosis, extraluminal gas, organized collection or abscess is seen. 2. Trace volume simple free fluid in the deep pelvis/rectal pouch, likely redistributed/reactive. 3. Small hiatal hernia with distal esophageal wall thickening, similar to prior. Correlate for features of esophagitis. 4. Fibroid uterus. 5. Stable 4.1 cm cystic structure in the left ovary. Recommend further evaluation with nonemergent outpatient pelvic ultrasound. This recommendation follows ACR consensus guidelines: White Paper of the ACR Incidental Findings Committee II on Adnexal Findings. J Am Coll Radiol [id number]:10:675-681. 6. Prior L3-L5 posterior and interbody fusion without acute hardware complication. 7. Aortic Atherosclerosis (ICD10-I70.0). Electronically Signed   By: Kreg ShropshirePrice  DeHay M.D.   On: 02/26/2020 01:52   CT Abdomen Pelvis W Contrast  Result Date: 02/24/2020 CLINICAL DATA:  Vomiting.  Nausea. EXAM: CT ABDOMEN AND PELVIS WITH CONTRAST TECHNIQUE: Multidetector CT imaging of the abdomen and pelvis was performed using the standard protocol following bolus administration of intravenous contrast. CONTRAST:  80mL OMNIPAQUE IOHEXOL 300 MG/ML  SOLN COMPARISON:  CT 04/04/2016 FINDINGS: Lower chest: Linear subsegmental atelectasis in the lower lobes. No pleural fluid. Hepatobiliary: 9 mm subcapsular cyst to the right hepatic dome. No suspicious lesion. Gallbladder physiologically distended, no calcified stone. No biliary dilatation. Pancreas: No ductal dilatation or inflammation. Spleen: Normal in size without focal abnormality. Adrenals/Urinary Tract: No adrenal nodule. No hydronephrosis or perinephric edema. Small cyst in the upper right kidney. Homogeneous renal enhancement with symmetric excretion on delayed phase imaging. Urinary bladder is partially distended  without wall thickening. Stomach/Bowel: Small hiatal hernia with mild distal esophageal wall thickening. Stomach otherwise unremarkable. There is no small bowel obstruction or inflammation. Appendix surgically absent. Scattered distal colonic diverticulosis without diverticulitis. Vascular/Lymphatic: Moderate aortic atherosclerosis. No aortic aneurysm. Patent portal vein. Small central mesenteric and retroperitoneal nodes that are not enlarged by size criteria. Reproductive: Calcified uterine fibroids. There is a 4.2 cm left ovarian cyst that was not seen on prior exam. Right ovary is not well visualized. Other: Small fat containing umbilical hernia. No ascites. No free air. No intra-abdominal abscess. No omental thickening. Musculoskeletal: Postsurgical and degenerative change in the spine. There are no acute or suspicious osseous abnormalities. IMPRESSION: 1. Small hiatal hernia with mild distal esophageal wall thickening, can be seen with reflux or esophagitis. 2. A 4.2 cm left ovarian cyst was not seen on prior exam. Recommend nonemergent pelvic ultrasound for characterization. 3. Colonic diverticulosis without diverticulitis. Aortic Atherosclerosis (ICD10-I70.0). Electronically Signed   By: Narda RutherfordMelanie  Sanford M.D.   On: 02/24/2020 03:53    Microbiology: Recent Results (from the past 240 hour(s))  Urine culture     Status: Abnormal   Collection Time: 02/24/20  4:21 AM   Specimen: Urine, Clean Catch  Result Value Ref Range Status   Specimen Description   Final    URINE, CLEAN CATCH Performed at [facility], [address] [place], [place] [address]    Special Requests   Final  NONE Performed at [facility], [address] [place], [place] [address]    Culture (A)  Final    30,000 COLONIES/mL MULTIPLE SPECIES PRESENT, SUGGEST RECOLLECTION   Report Status 02/25/2020 FINAL  Final  SARS Coronavirus 2 by RT PCR (hospital order, performed in [facility] hospital lab) Nasopharyngeal Nasopharyngeal  Swab     Status: None   Collection Time: 02/26/20  3:25 AM   Specimen: Nasopharyngeal Swab  Result Value Ref Range Status   SARS Coronavirus 2 NEGATIVE NEGATIVE Final    Comment: (NOTE) SARS-CoV-2 target nucleic acids are NOT DETECTED.  The SARS-CoV-2 RNA is generally detectable in upper and lower respiratory specimens during the acute phase of infection. The lowest concentration of SARS-CoV-2 viral copies this assay can detect is 250 copies / mL. A negative result does not preclude SARS-CoV-2 infection and should not be used as the sole basis for treatment or other patient management decisions.  A negative result may occur with improper specimen collection / handling, submission of specimen other than nasopharyngeal swab, presence of viral mutation(s) within the areas targeted by this assay, and inadequate number of viral copies (<250 copies / mL). A negative result must be combined with clinical observations, patient history, and epidemiological information.  Fact Sheet for Patients:   BoilerBrush.com.cy  Fact Sheet for Healthcare Providers: https://pope.com/  This test is not yet approved or  cleared by the [place] FDA and has been authorized for detection and/or diagnosis of SARS-CoV-2 by FDA under an Emergency Use Authorization (EUA).  This EUA will remain in effect (meaning this test can be used) for the duration of the COVID-19 declaration under Section 564(b)(1) of the Act, 21 U.S.C. section 360bbb-3(b)(1), unless the authorization is terminated or revoked sooner.  Performed at [facility], [address], [place], [place] [address]      Labs: Basic Metabolic Panel: Recent Labs  Lab 02/23/20 2156 02/25/20 2336 02/26/20 0457 02/28/20 0502  NA 141 138 138 142  K 3.9 3.5 3.6 3.3*  CL 106 108 106 110  CO2 GLUCOSE 159* 118* 148* 98  BUN 27* CREATININE 1.04* 0.92 0.95 0.94  CALCIUM 9.0  7.9* 7.9* 8.0*   Liver Function Tests: Recent Labs  Lab 02/23/20 2156 02/25/20 2336 02/26/20 0457  AST 27 44* 42*  ALT ALKPHOS 84 63 61  BILITOT 0.6 0.4 0.4  PROT 7.9 6.6 6.6  ALBUMIN 4.1 3.4* 3.4*   Recent Labs  Lab 02/23/20 2156 02/25/20 2336  LIPASE 36 32   No results for input(s): AMMONIA in the last 168 hours. CBC: Recent Labs  Lab 02/23/20 2156 02/25/20 2336 02/26/20 0457 02/28/20 0502  WBC 7.8 7.0 9.3 5.2  NEUTROABS  --  5.9  --   --   HGB 13.3 11.1* 10.8* 9.9*  HCT 41.4 35.7* 34.6* 31.1*  MCV 89.6 91.8 90.8 91.2  PLT 206 167 169 152   CBG: Recent Labs  Lab 02/27/20 1637 02/27/20 2357 02/28/20 0509 02/28/20 0740 02/28/20 1159  GLUCAP 116* 88 83 90 102*    Signed:  Vassie Loll MD.  [facility] 02/28/2020, 2:14 PM

## 2020-02-29 ENCOUNTER — Telehealth: Payer: Self-pay | Admitting: *Deleted

## 2020-02-29 NOTE — Telephone Encounter (Signed)
TRANSITIONAL CARE MANAGEMENT TELEPHONE OUTREACH NOTE   Contact Date: 02/29/2020 Contacted By: Magdalene River, LPN    DISCHARGE INFORMATION Date of Discharge:02/28/20 [facility] Discharge Diagnosis:Acute Gastritis without bleed   Outpatient Follow Up Recommendations (copied from discharge summary) Recommendations for Outpatient Follow-up:  1. Basic metabolic panel to follow electrolytes and renal function 2. Reassess blood pressure and adjust antihypertensive regimen as needed 3. Assure complete resolution of patient's symptoms and make sure that she has follow-up with gastroenterology as discussed.  Cynthia Mays is a female primary care patient of Junie Spencer, FNP. An outgoing telephone call was made today and I spoke with Patient, herself.  Cynthia Mays condition(s) and treatment(s) were discussed. An opportunity to ask questions was provided and all were answered or forwarded as appropriate.    ACTIVITIES OF DAILY LIVING  Cynthia Mays lives alone and she can perform ADLs independently. her primary caregiver is herself. she is able to depend on her primary caregiver(s) for consistent help. Transportation to appointments, to pick up medications, and to run errands is not a problem.  (Consider referral to [facility] if transportation or a consistent caregiver is a problem)   Fall Risk Fall Risk  02/03/2020 02/03/2020  Falls in the past year? 1 0  Number falls in past yr: 0 -  Injury with Fall? 1 -  Risk for fall due to : History of fall(s) -  Follow up Falls evaluation completed -    low Fall Risk   Home Modifications/Assistive Devices Wheelchair: No Cane: No Ramp: No Bedside Toilet: No Hospital Bed:  No Other: shower chair and walker    Home Health Services she is not receiving home health none services.     MEDICATION RECONCILIATION  Cynthia Mays has been able to pick-up all prescribed discharge medications from the  pharmacy.   A post discharge medication reconciliation was performed and the complete medication list was reviewed with the patient/caregiver and is current as of 02/29/2020. Changes highlighted below.  Discontinued Medications STOP taking these medications   naproxen sodium 220 MG tablet Commonly known as: ALEVE     Current Medication List Allergies as of 02/29/2020      Reactions   Codeine Other (See Comments)   Headache, gi upset   Penicillins Rash   Has patient had a PCN reaction causing immediate rash, facial/tongue/throat swelling, SOB or lightheadedness with hypotension: Yes Has patient had a PCN reaction causing severe rash involving mucus membranes or skin necrosis: No Has patient had a PCN reaction that required hospitalization No Has patient had a PCN reaction occurring within the last 10 years: No If all of the above answers are "NO", then may proceed with Cephalosporin use.   Pravastatin    Leg cramps      Medication List       Accurate as of February 29, 2020  2:32 PM. If you have any questions, ask your nurse or doctor.        cholecalciferol 25 MCG (1000 UNIT) tablet Commonly known as: VITAMIN D3 Take 1,000 Units by mouth daily.   ciprofloxacin 500 MG tablet Commonly known as: Cipro Take 1 tablet (500 mg total) by mouth 2 (two) times daily for 7 days.   clobetasol cream 0.05 % Commonly known as: TEMOVATE Apply 1 application topically daily as needed (rash).   diazepam 5 MG tablet Commonly known as: VALIUM Take 5 mg by mouth every 6 (six) hours as needed for  anxiety or muscle spasms.   famotidine 40 MG tablet Commonly known as: PEPCID Take 1 tablet (40 mg total) by mouth at bedtime.   fexofenadine 180 MG tablet Commonly known as: ALLEGRA Take 180 mg by mouth daily.   fluticasone 50 MCG/ACT nasal spray Commonly known as: FLONASE Place 2 sprays into the nose daily as needed for allergies.   HYDROcodone-acetaminophen 5-325 MG tablet Commonly known  as: NORCO/VICODIN Take 1 tablet by mouth every 6 (six) hours as needed for moderate pain.   levothyroxine 112 MCG tablet Commonly known as: Euthyrox TAKE 1 TABLET BY MOUTH ONCE DAILY BEFORE BREAKFAST   lisinopril 10 MG tablet Commonly known as: ZESTRIL Take 0.5 tablets (5 mg total) by mouth daily.   metFORMIN 500 MG tablet Commonly known as: GLUCOPHAGE Take 1 tablet (500 mg total) by mouth 2 (two) times daily.   metroNIDAZOLE 500 MG tablet Commonly known as: Flagyl Take 1 tablet (500 mg total) by mouth 3 (three) times daily for 7 days.   multivitamins ther. w/minerals Tabs tablet Take 1 tablet by mouth daily.   NIFEdipine 60 MG 24 hr tablet Commonly known as: PROCARDIA XL/NIFEDICAL XL Take 1 tablet (60 mg total) by mouth daily.   ondansetron 4 MG tablet Commonly known as: ZOFRAN Take 1 tablet (4 mg total) by mouth every 6 (six) hours.   ondansetron 8 MG disintegrating tablet Commonly known as: Zofran ODT Take 1 tablet (8 mg total) by mouth every 8 (eight) hours as needed for nausea or vomiting.   pantoprazole 40 MG tablet Commonly known as: PROTONIX Take 40 mg by mouth 2 (two) times daily.   rosuvastatin 5 MG tablet Commonly known as: CRESTOR Take 1 tablet (5 mg total) by mouth daily.   sertraline 50 MG tablet Commonly known as: ZOLOFT TAKE 1 & 1/2 (ONE & ONE-HALF) TABLETS BY MOUTH ONCE DAILY What changed:   how much to take  how to take this  when to take this        PATIENT EDUCATION & FOLLOW-UP PLAN  An appointment for Transitional Care Management is scheduled with Junie Spencer, FNP on 03/08/20 at 1140am.  Take all medications as prescribed  Contact our office by calling [phone] if you have any questions or concerns

## 2020-03-06 ENCOUNTER — Ambulatory Visit: Payer: Medicare Other | Admitting: *Deleted

## 2020-03-06 DIAGNOSIS — K529 Noninfective gastroenteritis and colitis, unspecified: Secondary | ICD-10-CM

## 2020-03-06 DIAGNOSIS — R10826 Epigastric rebound abdominal tenderness: Secondary | ICD-10-CM

## 2020-03-06 NOTE — Chronic Care Management (AMB) (Signed)
   Care Management   EMMI Follow-up Note  03/06/2020 Name: Cynthia Mays MRN: [id number] DOB: 1940/11/13  [name] was admitted to [facility] on 02/25/20 and discharged on 02/28/20 with instructions to follow-up with PCP and GI. Discharge diagnosis was Enterocolitis and Epigastric pain. She was contacted by PCP office for Transitional Care Management and an appt with her PCP is scheduled for 03/08/20. GI f/u is pending scheduling. She does have some questions about appropriate diet and was instructed to contact her GI, Dr Karilyn Cota, at [phone].      Follow up plan: PCP visit 03/08/20 Dr Karilyn Cota office visit scheduled for 03/07/20 Patient will discuss diet with Dr Karilyn Cota   Demetrios Loll, BSN, RN-BC Embedded Chronic Care Manager [facility] / [facility] Care Management Direct Dial: [phone]

## 2020-03-07 ENCOUNTER — Other Ambulatory Visit: Payer: Self-pay

## 2020-03-07 ENCOUNTER — Encounter (INDEPENDENT_AMBULATORY_CARE_PROVIDER_SITE_OTHER): Payer: Self-pay | Admitting: Gastroenterology

## 2020-03-07 ENCOUNTER — Ambulatory Visit (INDEPENDENT_AMBULATORY_CARE_PROVIDER_SITE_OTHER): Payer: Medicare Other | Admitting: Gastroenterology

## 2020-03-07 VITALS — BP 121/79 | HR 72 | Temp 98.7°F | Ht 59.0 in | Wt 198.0 lb

## 2020-03-07 DIAGNOSIS — R197 Diarrhea, unspecified: Secondary | ICD-10-CM | POA: Diagnosis not present

## 2020-03-07 LAB — COMPLETE METABOLIC PANEL WITH GFR
AG Ratio: 1.4 (calc) (ref 1.0–2.5)
ALT: 14 U/L (ref 6–29)
AST: 25 U/L (ref 10–35)
Albumin: 3.8 g/dL (ref 3.6–5.1)
Alkaline phosphatase (APISO): 54 U/L (ref 37–153)
BUN/Creatinine Ratio: 15 (calc) (ref 6–22)
BUN: 14 mg/dL (ref 7–25)
CO2: 28 mmol/L (ref 20–32)
Calcium: 8.9 mg/dL (ref 8.6–10.4)
Chloride: 107 mmol/L (ref 98–110)
Creat: 0.96 mg/dL — ABNORMAL HIGH (ref 0.60–0.93)
GFR, Est African American: 66 mL/min/{1.73_m2} (ref >=60)
GFR, Est Non African American: 57 mL/min/{1.73_m2} — ABNORMAL LOW (ref >=60)
Globulin: 2.8 g/dL (calc) (ref 1.9–3.7)
Glucose, Bld: 123 mg/dL — ABNORMAL HIGH (ref 65–99)
Potassium: 4.3 mmol/L (ref 3.5–5.3)
Sodium: 141 mmol/L (ref 135–146)
Total Bilirubin: 0.4 mg/dL (ref 0.2–1.2)
Total Protein: 6.6 g/dL (ref 6.1–8.1)

## 2020-03-07 LAB — CBC WITH DIFFERENTIAL/PLATELET
Absolute Monocytes: 567 cells/uL (ref 200–950)
Basophils Absolute: 49 cells/uL (ref 0–200)
Basophils Relative: 0.7 %
Eosinophils Absolute: 259 cells/uL (ref 15–500)
Eosinophils Relative: 3.7 %
HCT: 38.4 % (ref 35.0–45.0)
Hemoglobin: 12.4 g/dL (ref 11.7–15.5)
Lymphs Abs: 1421 cells/uL (ref 850–3900)
MCH: 27.9 pg (ref 27.0–33.0)
MCHC: 32.3 g/dL (ref 32.0–36.0)
MCV: 86.3 fL (ref 80.0–100.0)
MPV: 9.4 fL (ref 7.5–12.5)
Monocytes Relative: 8.1 %
Neutro Abs: 4704 cells/uL (ref 1500–7800)
Neutrophils Relative %: 67.2 %
Platelets: 243 10*3/uL (ref 140–400)
RBC: 4.45 10*6/uL (ref 3.80–5.10)
RDW: 13.6 % (ref 11.0–15.0)
Total Lymphocyte: 20.3 %
WBC: 7 10*3/uL (ref 3.8–10.8)

## 2020-03-07 NOTE — Progress Notes (Signed)
Patient profile: Cynthia Mays is a 79 y.o. female seen for hospital f/up. Discharged on 02/28/20 on Cipro/flagyl after CT abd/pelvis showed enterocolitis.  She was admitted on 02/25/2020 for severe abdominal pain, diarrhea, vomiting.  She was also seen in the ED on 02/23/20 for similar symptoms.  Last seen in office 08/2019 for diarrhea after doxycycline, stool studies returned normal.   History of Present Illness: Cynthia Mays is seen today and reports she completed the cipro/flagyl yesterday. Today she endorses sour taste in mouth and no energy. She has had intermittent nausea and using zofran PRN (used twice since discharge).   She reports since being home bowels are currently soft stools without blood, average 4x/yesterday, This is a big improvement since her discharge where she was having 10-15 bowel movements a day during admission. She reports abd pain has completely resolved at this point.  She is still eating a fairly bland diet. Yesterday she had [place] sand which and yogurt which did not aggravate symptoms. Baseline bowel habits are 1x/day.   She is on Protonix for GERD and does not have symptoms unless she eats pizza or spaghetti or other tomato-based foods.    Wt Readings from Last 3 Encounters:  03/07/20 198 lb (89.8 kg)  02/26/20 198 lb 3.1 oz (89.9 kg)  02/23/20 190 lb (86.2 kg)     Last Colonoscopy: 2002-normal. Last Endoscopy: 10/2016--Grade a esophagitis, irregular Z-line, 3 similar hiatal hernia, gastric polyps, gastritis.   Past Medical History:  Past Medical History:  Diagnosis Date  . Complication of anesthesia   . Depression   . Diabetes mellitus   . Fibromyalgia   . GERD (gastroesophageal reflux disease)   . Hiatal hernia   . Hyperlipidemia   . Hypertension   . Hypothyroidism   . Hypothyroidism 07/23/2016  . PONV (postoperative nausea and vomiting)     Problem List: Patient Active Problem List   Diagnosis Date Noted  . Epigastric pain     . Enterocolitis 02/26/2020  . Diarrhea 08/03/2019  . Epigastric abdominal tenderness 05/30/2019  . Obesity, Class III, BMI 40-49.9 (morbid obesity) (HCC) 03/31/2019  . Chronic use of benzodiazepine for therapeutic purpose 01/26/2018  . Hiatal hernia 10/29/2017  . Hyperlipidemia associated with type 2 diabetes mellitus (HCC) 12/31/2015  . Coronary artery disease due to lipid rich plaque 12/31/2015  . Left knee pain 10/24/2015  . 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 Essential hypertension 02/20/2015  . Chronic low back pain with sciatica 02/20/2015  . Gastroesophageal reflux disease without esophagitis 11/20/2014  . Anxiety, generalized 11/20/2014  . Hypothyroidism 12/27/2013  . Type 2 diabetes mellitus without complication, without long-term current use of insulin (HCC) 12/27/2013  . Precordial pain 01/15/2013    Past Surgical History: Past Surgical History:  Procedure Laterality Date  . APPENDECTOMY  2002  . arthroscopy  right 2002, left 2007   bilateral knees  . BACK SURGERY  10/2007, 09/2008   x2  . BIOPSY  10/02/2016   Procedure: BIOPSY;  Surgeon: Malissa Hippo, MD;  Location: [facility];  Service: Endoscopy;;  gastric  . CARPAL TUNNEL RELEASE  1986   bilateral  . CESAREAN SECTION  1969, 1967   x2  . ESOPHAGOGASTRODUODENOSCOPY N/A 10/02/2016   Procedure: ESOPHAGOGASTRODUODENOSCOPY (EGD);  Surgeon: Malissa Hippo, MD;  Location: [facility];  Service: Endoscopy;  Laterality: N/A;  11:15 - moved to 2/1 @ 3:00  . KNEE SURGERY     both    Allergies: Allergies  Allergen Reactions  . Codeine Other (See Comments)    Headache, gi upset  . Penicillins Rash    Has patient had a PCN reaction causing immediate rash, facial/tongue/throat swelling, SOB or lightheadedness with hypotension: Yes Has patient had a PCN reaction causing severe rash involving mucus membranes or skin necrosis: No Has patient had a PCN reaction that required hospitalization No Has patient had a  PCN reaction occurring within the last 10 years: No If all of the above answers are "NO", then may proceed with Cephalosporin use.   . Pravastatin     Leg cramps      Home Medications:  Current Outpatient Medications:  .  cholecalciferol (VITAMIN D3) 25 MCG (1000 UNIT) tablet, Take 1,000 Units by mouth daily., Disp: , Rfl:  .  clobetasol cream (TEMOVATE) 0.05 %, Apply 1 application topically daily as needed (rash)., Disp: 30 g, Rfl: 11 .  diazepam (VALIUM) 5 MG tablet, Take 5 mg by mouth every 6 (six) hours as needed for anxiety or muscle spasms., Disp: , Rfl:  .  famotidine (PEPCID) 40 MG tablet, Take 1 tablet (40 mg total) by mouth at bedtime., Disp: 90 tablet, Rfl: 1 .  fexofenadine (ALLEGRA) 180 MG tablet, Take 180 mg by mouth daily., Disp: , Rfl:  .  fluticasone (FLONASE) 50 MCG/ACT nasal spray, Place 2 sprays into the nose daily as needed for allergies. , Disp: , Rfl:  .  HYDROcodone-acetaminophen (NORCO/VICODIN) 5-325 MG tablet, Take 1 tablet by mouth every 6 (six) hours as needed for moderate pain., Disp: , Rfl:  .  levothyroxine (EUTHYROX) 112 MCG tablet, TAKE 1 TABLET BY MOUTH ONCE DAILY BEFORE BREAKFAST, Disp: 90 tablet, Rfl: 3 .  lisinopril (ZESTRIL) 10 MG tablet, Take 0.5 tablets (5 mg total) by mouth daily., Disp: 90 tablet, Rfl: 1 .  metFORMIN (GLUCOPHAGE) 500 MG tablet, Take 1 tablet (500 mg total) by mouth 2 (two) times daily., Disp: 180 tablet, Rfl: 1 .  Multiple Vitamins-Minerals (MULTIVITAMINS THER. W/MINERALS) TABS, Take 1 tablet by mouth daily.  , Disp: , Rfl:  .  NIFEdipine (PROCARDIA XL/NIFEDICAL XL) 60 MG 24 hr tablet, Take 1 tablet (60 mg total) by mouth daily., Disp: 90 tablet, Rfl: 0 .  ondansetron (ZOFRAN ODT) 8 MG disintegrating tablet, Take 1 tablet (8 mg total) by mouth every 8 (eight) hours as needed for nausea or vomiting., Disp: 20 tablet, Rfl: 0 .  pantoprazole (PROTONIX) 40 MG tablet, Take 40 mg by mouth 2 (two) times daily., Disp: , Rfl:  .  rosuvastatin  (CRESTOR) 5 MG tablet, Take 1 tablet (5 mg total) by mouth daily., Disp: 90 tablet, Rfl: 1 .  sertraline (ZOLOFT) 50 MG tablet, TAKE 1 & 1/2 (ONE & ONE-HALF) TABLETS BY MOUTH ONCE DAILY (Patient taking differently: Take 75 mg by mouth daily. TAKE 1 & 1/2 (ONE & ONE-HALF) TABLETS BY MOUTH ONCE DAILY), Disp: 135 tablet, Rfl: 0 .  ondansetron (ZOFRAN) 4 MG tablet, Take 1 tablet (4 mg total) by mouth every 6 (six) hours. (Patient not taking: Reported on 03/07/2020), Disp: 12 tablet, Rfl: 0   Family History: family history includes Anesthesia problems in her mother; CAD (age of onset: 46) in her mother; Cancer in her mother and sister; Deafness in her sister; Heart disease in her brother; Hypertension in her father; Suicidality in her brother, father, and son.    Social History:   reports that she has quit smoking. Her smoking use included cigarettes. She has a 43.00 pack-year smoking history.  She has never used smokeless tobacco. She reports that she does not drink alcohol and does not use drugs.   Review of Systems: Constitutional: Denies weight loss/weight gain  Eyes: No changes in vision. ENT: No oral lesions, sore throat.  GI: see HPI.  Heme/Lymph: No easy bruising.  CV: No chest pain.  GU: No hematuria.  Integumentary: No rashes.  Neuro: No headaches.  Psych: No depression/anxiety.  Endocrine: No heat/cold intolerance.  Allergic/Immunologic: No urticaria.  Resp: No cough, SOB.  Musculoskeletal: No joint swelling.    Physical Examination: BP 121/79 (BP Location: Left Arm)   Pulse 72   Temp 98.7 F (37.1 C)   Ht 4\' 11"  (1.499 m)   Wt 198 lb (89.8 kg)   BMI 39.99 kg/m  Gen: NAD, alert and oriented x 4 HEENT: PEERLA, EOMI, Neck: supple, no JVD Chest: CTA bilaterally, no wheezes, crackles, or other adventitious sounds CV: RRR, no m/g/c/r Abd: soft, NT, ND, +BS in all four quadrants; no HSM, guarding, ridigity, or rebound tenderness Ext: no edema, well perfused with 2+  pulses, Skin: no rash or lesions noted on observed skin Lymph: no noted LAD  Data Reviewed:  Discharge labs February 28, 2020-hemoglobin 9.9, MCV normal 02/26/20-calcium 7.9, glucose 148, albumin 3.4, AST 42, GFR 58.   IMPRESSION: CT a/p 01/2020  1. Fluid-filled loops of distal small bowel and mild thickening involving the terminal ileum and cecum with extensive surrounding phlegmonous change. Findings are most consistent with enterocolitis, either infectious or inflammatory in etiology. No pneumatosis, extraluminal gas, organized collection or abscess is seen. 2. Trace volume simple free fluid in the deep pelvis/rectal pouch, likely redistributed/reactive. 3. Small hiatal hernia with distal esophageal wall thickening, similar to prior. Correlate for features of esophagitis. 4. Fibroid uterus. 5. Stable 4.1 cm cystic structure in the left ovary. Recommend further evaluation with nonemergent outpatient pelvic ultrasound. This recommendation follows ACR consensus guidelines: White Paper of the ACR Incidental Findings Committee II on Adnexal Findings.Am Coll Radiol [phone]. 6. Prior L3-L5 posterior and interbody fusion without acute hardware complication. 7. Aortic Atherosclerosis (ICD10-I70.0).    Assessment/Plan: Ms. Seevers is a 79 y.o. female seen with CT findings consistent with enterocolitis.  She has completed her Cipro Flagyl yesterday and is feeling better but is not 100% back to her normal bowel habits.  Recommend she start a probiotic and will check her basic labs to ensure electrolytes, hemoglobin etc. has normalized after discharge.  She is advancing her diet as tolerated.  Still having bad taste from Flagyl but reviewed this will likely improve over the next few days.  2.  Left ovarian cyst-reviewed she needs a pelvic ultrasound as outpatient for follow-up, she will discuss this with her PCP.  3.  GERD-chronically controlled on PPI which she will continue.  No upper GI  alarm symptoms.     Soraya was seen today for hospitalization follow-up.  Diagnoses and all orders for this visit:  Diarrhea, unspecified type -     CBC with Differential -     Cancel: Basic Metabolic Panel (BMET) -     COMPLETE METABOLIC PANEL WITH GFR    F/up pending lab results and clinical course   I personally performed the service, non-incident to. (WP)  Claris Gower, [facility]

## 2020-03-07 NOTE — Patient Instructions (Addendum)
Try Vear Clock Colon health or Align probiotic. We are checking labs today and will call w/ results.

## 2020-03-08 ENCOUNTER — Ambulatory Visit (INDEPENDENT_AMBULATORY_CARE_PROVIDER_SITE_OTHER): Payer: Medicare Other | Admitting: Family

## 2020-03-08 ENCOUNTER — Other Ambulatory Visit: Payer: Self-pay

## 2020-03-08 ENCOUNTER — Encounter: Payer: Self-pay | Admitting: Family

## 2020-03-08 VITALS — BP 114/70 | HR 71 | Temp 96.8°F | Ht 59.0 in | Wt 190.6 lb

## 2020-03-08 DIAGNOSIS — Z09 Encounter for follow-up examination after completed treatment for conditions other than malignant neoplasm: Secondary | ICD-10-CM | POA: Diagnosis not present

## 2020-03-08 DIAGNOSIS — K2900 Acute gastritis without bleeding: Secondary | ICD-10-CM

## 2020-03-08 DIAGNOSIS — R197 Diarrhea, unspecified: Secondary | ICD-10-CM

## 2020-03-08 DIAGNOSIS — R109 Unspecified abdominal pain: Secondary | ICD-10-CM

## 2020-03-08 NOTE — Progress Notes (Signed)
Subjective:    Patient ID: Cynthia Mays, female    DOB: Nov 06, 1940, 79 y.o.   MRN: [id number]  Chief Complaint  Patient presents with  . Hospitalization Follow-up    Gastritis     HPI Today's visit is for Transitional Care Management.  The patient was discharged from [facility] on 02/25/20 with a primary diagnosis of acute gastritis without bleed.   Contact with the patient and/or caregiver, by a clinical staff member, was made on 02/29/20 and was documented as a telephone encounter within the EMR.  Through chart review and discussion with the patient I have determined that management of their condition is of moderate complexity.   She went to the ED on 02/25/20 with abdominal pain and N&V and had a CT scan that showed findings consistent with enterocolitis, patient started on Cipro and Flagyl. She has completed her antibiotics.   Denies any N&V, abdominal pain. She does have complaints of diarrhea. She saw her GI yesterday. She had a stable CBC and CMP.   She reports she is eating a soft diet.   Review of Systems  Gastrointestinal: Positive for diarrhea.  All other systems reviewed and are negative.      Objective:   Physical Exam Vitals reviewed.  Constitutional:      General: She is not in acute distress.    Appearance: She is well-developed.  HENT:     Head: Normocephalic and atraumatic.     Right Ear: Tympanic membrane normal.     Left Ear: Tympanic membrane normal.  Eyes:     Pupils: Pupils are equal, round, and reactive to light.  Neck:     Thyroid: No thyromegaly.  Cardiovascular:     Rate and Rhythm: Normal rate and regular rhythm.     Heart sounds: Normal heart sounds. No murmur heard.   Pulmonary:     Effort: Pulmonary effort is normal. No respiratory distress.     Breath sounds: Normal breath sounds. No wheezing.  Abdominal:     General: Bowel sounds are normal. There is no distension.     Palpations: Abdomen is soft.     Tenderness: There is  abdominal tenderness (RUQ and RLQ). There is guarding.  Musculoskeletal:        General: No tenderness. Normal range of motion.     Cervical back: Normal range of motion and neck supple.  Skin:    General: Skin is warm and dry.  Neurological:     Mental Status: She is alert and oriented to person, place, and time.     Cranial Nerves: No cranial nerve deficit.     Deep Tendon Reflexes: Reflexes are normal and symmetric.  Psychiatric:        Behavior: Behavior normal.        Thought Content: Thought content normal.        Judgment: Judgment normal.       BP 114/70   Pulse 71   Temp (!) 96.8 F (36 C) (Temporal)   Ht 4\' 11"  (1.499 m)   Wt 190 lb 9.6 oz (86.5 kg)   BMI 38.50 kg/m      Assessment & Plan:  Cynthia Mays comes in today with chief complaint of Hospitalization Follow-up (Gastritis )   Diagnosis and orders addressed:  1. Acute gastritis without hemorrhage, unspecified gastritis type  2. Hospital discharge follow-up  3. Diarrhea, unspecified type  4. Abdominal pain, unspecified abdominal location   Labs reviewed from GI provider Health  Maintenance reviewed Diet and exercise encouraged  Follow up plan: As needed    [name], FNP

## 2020-03-08 NOTE — Patient Instructions (Signed)
Gastritis, Adult Gastritis is inflammation of the stomach. There are two kinds of gastritis:  Acute gastritis. This kind develops suddenly.  Chronic gastritis. This kind is much more common and lasts for a long time. Gastritis happens when the lining of the stomach becomes weak or gets damaged. Without treatment, gastritis can lead to stomach bleeding and ulcers. What are the causes? This condition may be caused by:  An infection.  Drinking too much alcohol.  Certain medicines. These include steroids, antibiotics, and some over-the-counter medicines, such as aspirin or ibuprofen.  Having too much acid in the stomach.  A disease of the intestines or stomach.  Stress.  An allergic reaction.  Crohn's disease.  Some cancer treatments (radiation). Sometimes the cause of this condition is not known. What are the signs or symptoms? Symptoms of this condition include:  Pain or a burning sensation in the upper abdomen.  Nausea.  Vomiting.  An uncomfortable feeling of fullness after eating.  Weight loss.  Bad breath.  Blood in your vomit or stools. In some cases, there are no symptoms. How is this diagnosed? This condition may be diagnosed with:  Your medical history and a description of your symptoms.  A physical exam.  Tests. These can include: ? Blood tests. ? Stool tests. ? A test in which a thin, flexible instrument with a light and a camera is passed down the esophagus and into the stomach (upper endoscopy). ? A test in which a sample of tissue is taken for testing (biopsy). How is this treated? This condition may be treated with medicines. The medicines that are used vary depending on the cause of the gastritis:  If the condition is caused by a bacterial infection, you may be given antibiotic medicines.  If the condition is caused by too much acid in the stomach, you may be given medicines called H2 blockers, proton pump inhibitors, or antacids. Treatment  may also involve stopping the use of certain medicines, such as aspirin, ibuprofen, or other NSAIDs. Follow these instructions at home: Medicines  Take over-the-counter and prescription medicines only as told by your health care provider.  If you were prescribed an antibiotic medicine, take it as told by your health care provider. Do not stop taking the antibiotic even if you start to feel better. Eating and drinking   Eat small, frequent meals instead of large meals.  Avoid foods and drinks that make your symptoms worse.  Drink enough fluid to keep your urine pale yellow. Alcohol use  Do not drink alcohol if: ? Your health care provider tells you not to drink. ? You are pregnant, may be pregnant, or are planning to become pregnant.  If you drink alcohol: ? Limit your use to:  0-1 drink a day for women.  0-2 drinks a day for men. ? Be aware of how much alcohol is in your drink. In the [place] one drink equals one 12 oz bottle of beer (355 mL), one 5 oz glass of wine (148 mL), or one 1 oz glass of hard liquor (44 mL). General instructions  Talk with your health care provider about ways to manage stress, such as getting regular exercise or practicing deep breathing, meditation, or yoga.  Do not use any products that contain nicotine or tobacco, such as cigarettes and e-cigarettes. If you need help quitting, ask your health care provider.  Keep all follow-up visits as told by your health care provider. This is important. Contact a health care provider if:  Your   symptoms get worse.  Your symptoms return after treatment. Get help right away if:  You vomit blood or material that looks like coffee grounds.  You have black or dark red stools.  You are unable to keep fluids down.  Your abdominal pain gets worse.  You have a fever.  You do not feel better after one week. Summary  Gastritis is inflammation of the lining of the stomach that can occur suddenly (acute) or  develop slowly over time (chronic).  This condition is diagnosed with a medical history, a physical exam, or tests.  This condition may be treated with medicines to treat infection or medicines to reduce the amount of acid in your stomach.  Follow your health care provider's instructions about taking medicines, making changes to your diet, and knowing when to call for help. This information is not intended to replace advice given to you by your health care provider. Make sure you discuss any questions you have with your health care provider. Document Revised: 01/05/2018 Document Reviewed: 01/05/2018 Elsevier Patient Education  2020 Elsevier Inc.  

## 2020-03-28 ENCOUNTER — Telehealth: Payer: Self-pay | Admitting: *Deleted

## 2020-03-28 NOTE — Telephone Encounter (Signed)
Pt called in to staff today stating that her BP was low, last checked was 75/40. Her meds were reviewed and clarified.  -per Epic she is supposed to be on lisinopril 5 mg QD - per patient she is taking 10 mg QD.   She is aware to only make this change and keep other meds the same.  She will take 5 mg tomorrow and 5 mg Friday am and monitor and write down readings 3-4 times a day. She will call me with these readings Friday afternoon.   If still too low at that point we can discuss further changes.  She is aware to push water today to help BP rise.   She doesn't complain of any symptoms with the Low BP.   FYI

## 2020-03-29 NOTE — Telephone Encounter (Signed)
PT needs to come in office and have BP rechecked. She should hold her lisinopril and nifedipine 60 mg. Is she taking Valium and Norco daily? She should limit these too as they can cause hypotension.

## 2020-03-29 NOTE — Telephone Encounter (Signed)
lmtcb

## 2020-03-30 ENCOUNTER — Ambulatory Visit: Payer: Medicare Other

## 2020-03-30 ENCOUNTER — Other Ambulatory Visit: Payer: Self-pay

## 2020-03-30 VITALS — BP 132/70

## 2020-03-30 DIAGNOSIS — I959 Hypotension, unspecified: Secondary | ICD-10-CM

## 2020-03-30 NOTE — Progress Notes (Addendum)
BP in right arm 138/72 after being called back  BP in left arm 132/70 after resting for 5 minutes.  Pt did not take Lisinopril or Nifedipine today.  She admits to taking about 1 valium per month and she had not taken norco in 2-3 months.  Pt did not bring her BP cuff that she uses at home.  The last episode of feeling weak and tired was yesterday. She did not loose consciousness.  Per [name], FNP pt should resume 0.5mg  Lisinopril qd, Nifedipine 60mg  qd

## 2020-04-02 ENCOUNTER — Telehealth (INDEPENDENT_AMBULATORY_CARE_PROVIDER_SITE_OTHER): Payer: Self-pay | Admitting: Gastroenterology

## 2020-04-02 NOTE — Telephone Encounter (Signed)
Please call patient and make sure she is still doing well from a GI standpoint after her visit last month.  Hopefully diarrhea, nausea & vomiting resolved.  If she is doing well she can follow-up with GI as needed. She can let us know if wants to schedule colonoscopy at any point (last 2002).  Thanks.

## 2020-04-03 NOTE — Telephone Encounter (Signed)
May I ask that you call the patient with Janice's instruction /recommendation.

## 2020-04-03 NOTE — Telephone Encounter (Signed)
Called and spoke with patient she said that she is doing better still having some diarrhea off and on , she said that wasn't bad. Offer patient appointment come if felt like she needed. Pt said that the diarrhea wasn't that bad that it wasn't watery or wasn't all time. Asked pt if she had any vomiting or nausea she said no. She said that she would call back if diarrhea get worse or gets other symptom. Reminded pt of her appt. In 08/02/20.

## 2020-04-04 NOTE — Telephone Encounter (Signed)
Noted. Thank you for update!

## 2020-04-18 ENCOUNTER — Ambulatory Visit (INDEPENDENT_AMBULATORY_CARE_PROVIDER_SITE_OTHER): Payer: Medicare Other | Admitting: Gastroenterology

## 2020-04-19 ENCOUNTER — Encounter (INDEPENDENT_AMBULATORY_CARE_PROVIDER_SITE_OTHER): Payer: Self-pay | Admitting: *Deleted

## 2020-04-19 ENCOUNTER — Other Ambulatory Visit (INDEPENDENT_AMBULATORY_CARE_PROVIDER_SITE_OTHER): Payer: Self-pay | Admitting: Gastroenterology

## 2020-04-19 ENCOUNTER — Encounter (INDEPENDENT_AMBULATORY_CARE_PROVIDER_SITE_OTHER): Payer: Self-pay | Admitting: Gastroenterology

## 2020-04-19 ENCOUNTER — Telehealth (INDEPENDENT_AMBULATORY_CARE_PROVIDER_SITE_OTHER): Payer: Self-pay | Admitting: *Deleted

## 2020-04-19 ENCOUNTER — Other Ambulatory Visit: Payer: Self-pay

## 2020-04-19 ENCOUNTER — Ambulatory Visit (INDEPENDENT_AMBULATORY_CARE_PROVIDER_SITE_OTHER): Payer: Medicare Other | Admitting: Gastroenterology

## 2020-04-19 VITALS — BP 134/66 | HR 59 | Temp 98.5°F | Ht 59.0 in | Wt 185.4 lb

## 2020-04-19 DIAGNOSIS — K219 Gastro-esophageal reflux disease without esophagitis: Secondary | ICD-10-CM | POA: Diagnosis not present

## 2020-04-19 DIAGNOSIS — M25562 Pain in left knee: Secondary | ICD-10-CM | POA: Diagnosis not present

## 2020-04-19 DIAGNOSIS — M170 Bilateral primary osteoarthritis of knee: Secondary | ICD-10-CM | POA: Diagnosis not present

## 2020-04-19 DIAGNOSIS — M1712 Unilateral primary osteoarthritis, left knee: Secondary | ICD-10-CM | POA: Diagnosis not present

## 2020-04-19 DIAGNOSIS — K529 Noninfective gastroenteritis and colitis, unspecified: Secondary | ICD-10-CM

## 2020-04-19 DIAGNOSIS — R1031 Right lower quadrant pain: Secondary | ICD-10-CM | POA: Diagnosis not present

## 2020-04-19 MED ORDER — PLENVU 140 G PO SOLR
1.0000 | Freq: Once | ORAL | 0 refills | Status: DC
Start: 2020-04-19 — End: 2020-04-19

## 2020-04-19 NOTE — Patient Instructions (Signed)
Schedule EGD and colonoscopy

## 2020-04-19 NOTE — Telephone Encounter (Signed)
Patient needs Plenvu (copay card) ° °

## 2020-04-19 NOTE — Progress Notes (Signed)
Cynthia Mays, M.D. Gastroenterology & Hepatology [facility] [address] [place], [place] [address]  Primary Care Physician: Junie SpencerHawks, Christy A, FNP [address] [place] [id number]  I will communicate my assessment and recommendations to the referring MD via EMR. "Note: Occasional unusual wording and randomly placed punctuation marks may result from the use of speech recognition technology to transcribe this document"  Problems: 1. Right lower quadrant abdominal pain 2. Episode of enterocolitis 3. GERD  History of Present Illness: Cynthia Mays is a 79 y.o. female with past medical history of depression, diabetes, fibromyalgia, GERD, hyperlipidemia, hypertension hypothyroidism, who presents for follow up of right lower quadrant abdominal pain and intermittent diarrhea.  The patient was last seen on 03/07/2020. At that time, the patient was advised to take a probiotic and to advance her diet as tolerated.   The patient was previously seen in late June at [facility] due to an episode of enterocolitis, for which the patient was given a 1 week course of cipro and Flagyl.  She reported since then her bowel movements have become better formed and more regular, and she was not presenting any abdominal pain until recently.  However, the patient reports she has been having intermittent episodes of pasty bowel movements occasionally.  She reports that her baseline bowel movement frequency is usually 1 BM per day, but now has had 2 BMs per day.  States that the bowels are not watery anymore but yesterday she had 5 bowel movements. She states the BMs cause a "burning sensation" in her anal area. She has presented some loose bowel movements with salads, but no problems with grilled chicken or meat. Has not been able to advance diet completely.  She became recently concerned as she had an acute episode of RLQ abdominal pain that  woke her up in the middle of the night last Saturday. The pain lasted for 15 minutes and resolved spontaneously. Did not take any medication for pain at that time. She reported having some heartburn and vomited twice that day.   She reports that since her hospitalization she has lost close to 13 lb.  Has not taken any Norco since June.  The patient denies having any fever, chills, hematochezia, melena, hematemesis, abdominal distention, jaundice, pruritus .  Last EGD: 2018 - Found to have a hiatal hernia of 3 cm, presence of grade a reflux esophagitis, multiple gastric polyps which were biopsied and some gastric congestion gastritis.  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 pylori or dysplasia. Last Colonoscopy: 2002 - normal per patient  Past Medical History: Past Medical History:  Diagnosis Date  . Complication of anesthesia   . Depression   . Diabetes mellitus   . Fibromyalgia   . GERD (gastroesophageal reflux disease)   . Hiatal hernia   . Hyperlipidemia   . Hypertension   . Hypothyroidism   . Hypothyroidism 07/23/2016  . PONV (postoperative nausea and vomiting)     Past Surgical History: Past Surgical History:  Procedure Laterality Date  . APPENDECTOMY  2002  . arthroscopy  right 2002, left 2007   bilateral knees  . BACK SURGERY  10/2007, 09/2008   x2  . BIOPSY  10/02/2016   Procedure: BIOPSY;  Surgeon: Malissa HippoNajeeb U Rehman, MD;  Location: [facility];  Service: Endoscopy;;  gastric  . CARPAL TUNNEL RELEASE  1986   bilateral  . CESAREAN SECTION  1969, [phone]  x2  . ESOPHAGOGASTRODUODENOSCOPY N/A 10/02/2016   Procedure: ESOPHAGOGASTRODUODENOSCOPY (EGD);  Surgeon: Malissa Hippo, MD;  Location: [facility];  Service: Endoscopy;  Laterality: N/A;  11:15 - moved to 2/1 @ 3:00  . KNEE SURGERY     both    Family History: Family History  Problem Relation Age of Onset  . Anesthesia problems Mother   . CAD Mother 39  . Cancer  Mother        Pancreatic cancer  . Hypertension Father   . Suicidality Father        gunshot  . Deafness Sister   . Heart disease Brother   . Suicidality Son   . Cancer Sister        female   . Suicidality Brother   . Hypotension Neg Hx   . Pseudochol deficiency Neg Hx   . Malignant hyperthermia Neg Hx     Social History: Social History   Tobacco Use  Smoking Status Former Smoker  . Packs/day: 1.00  . Years: 43.00  . Pack years: 43.00  . Types: Cigarettes  Smokeless Tobacco Never Used  Tobacco Comment   > 20 years quit   Social History   Substance and Sexual Activity  Alcohol Use No   Comment: occasionally wine   Social History   Substance and Sexual Activity  Drug Use No    Allergies: Allergies  Allergen Reactions  . Codeine Other (See Comments)    Headache, gi upset  . Penicillins Rash    Has patient had a PCN reaction causing immediate rash, facial/tongue/throat swelling, SOB or lightheadedness with hypotension: Yes Has patient had a PCN reaction causing severe rash involving mucus membranes or skin necrosis: No Has patient had a PCN reaction that required hospitalization No Has patient had a PCN reaction occurring within the last 10 years: No If all of the above answers are "NO", then may proceed with Cephalosporin use.   . Pravastatin     Leg cramps    Medications: Current Outpatient Medications  Medication Sig Dispense Refill  . cholecalciferol (VITAMIN D3) 25 MCG (1000 UNIT) tablet Take 1,000 Units by mouth daily.    . clobetasol cream (TEMOVATE) 0.05 % Apply 1 application topically daily as needed (rash). 30 g 11  . diazepam (VALIUM) 5 MG tablet Take 5 mg by mouth every 6 (six) hours as needed for anxiety or muscle spasms.    . famotidine (PEPCID) 40 MG tablet Take 1 tablet (40 mg total) by mouth at bedtime. 90 tablet 1  . fexofenadine (ALLEGRA) 180 MG tablet Take 180 mg by mouth daily.    . fluticasone (FLONASE) 50 MCG/ACT nasal spray Place 2  sprays into the nose daily as needed for allergies.     Marland Kitchen levothyroxine (EUTHYROX) 112 MCG tablet TAKE 1 TABLET BY MOUTH ONCE DAILY BEFORE BREAKFAST 90 tablet 3  . lisinopril (ZESTRIL) 10 MG tablet Take 0.5 tablets (5 mg total) by mouth daily. 90 tablet 1  . metFORMIN (GLUCOPHAGE) 500 MG tablet Take 1 tablet (500 mg total) by mouth 2 (two) times daily. 180 tablet 1  . Multiple Vitamins-Minerals (MULTIVITAMINS THER. W/MINERALS) TABS Take 1 tablet by mouth daily.      Marland Kitchen NIFEdipine (PROCARDIA XL/NIFEDICAL XL) 60 MG 24 hr tablet Take 1 tablet (60 mg total) by mouth daily. 90 tablet 0  . ondansetron (ZOFRAN ODT) 8 MG disintegrating tablet Take 1 tablet (8 mg total) by mouth every 8 (eight) hours as needed for nausea or vomiting.  20 tablet 0  . pantoprazole (PROTONIX) 40 MG tablet Take 40 mg by mouth 2 (two) times daily.    . Probiotic Product (PHILLIPS COLON HEALTH) CAPS Take by mouth daily.     . rosuvastatin (CRESTOR) 5 MG tablet Take 1 tablet (5 mg total) by mouth daily. 90 tablet 1  . sertraline (ZOLOFT) 50 MG tablet TAKE 1 & 1/2 (ONE & ONE-HALF) TABLETS BY MOUTH ONCE DAILY (Patient taking differently: Take 75 mg by mouth daily. TAKE 1 & 1/2 (ONE & ONE-HALF) TABLETS BY MOUTH ONCE DAILY) 135 tablet 0   No current facility-administered medications for this visit.    Review of Systems: GENERAL: negative for malaise, night sweats HEENT: No changes in hearing or vision, no nose bleeds or other nasal problems. NECK: Negative for lumps, goiter, pain and significant neck swelling RESPIRATORY: Negative for cough, wheezing CARDIOVASCULAR: Negative for chest pain, leg swelling, palpitations, orthopnea GI: SEE HPI MUSCULOSKELETAL: Negative for joint pain or swelling, back pain, and muscle pain. SKIN: Negative for lesions, rash PSYCH: Negative for sleep disturbance, mood disorder and recent psychosocial stressors. HEMATOLOGY Negative for prolonged bleeding, bruising easily, and swollen nodes. ENDOCRINE:  Negative for cold or heat intolerance, polyuria, polydipsia and goiter. NEURO: negative for tremor, gait imbalance, syncope and seizures. The remainder of the review of systems is noncontributory.   Physical Exam: BP 134/66 (BP Location: Right Arm, Patient Position: Sitting, Cuff Size: Large)   Pulse (!) 59   Temp 98.5 F (36.9 C) (Oral)   Ht 4\' 11"  (1.499 m)   Wt 185 lb 6.4 oz (84.1 kg)   BMI 37.45 kg/m  GENERAL: The patient is AO x3, in no acute distress. Obese. HEENT: Head is normocephalic and atraumatic. EOMI are intact. Mouth is well hydrated and without lesions. NECK: Supple. No masses LUNGS: Clear to auscultation. No presence of rhonchi/wheezing/rales. Adequate chest expansion HEART: RRR, normal s1 and s2. ABDOMEN: tender to palpation in the RLQ and R flank upon deep palpation but no guarding, no peritoneal signs, and nondistended. BS +. No masses. EXTREMITIES: Without any cyanosis, clubbing, rash, lesions or edema. NEUROLOGIC: AOx3, no focal motor deficit. SKIN: no jaundice, no rashes  Imaging/Labs: as above  I personally reviewed and interpreted the available labs, imaging and endoscopic files.  Impression and Plan: Cynthia Mays is a 79 y.o. female with past medical history of depression, diabetes, fibromyalgia, GERD, hyperlipidemia, hypertension hypothyroidism, who presents for follow up of right lower quadrant abdominal pain and intermittent diarrhea.  The patient endorses recurrent episodes of loose bowel movements which are better compared to prior, but she had a new episode of right lower quadrant abdominal pain.  During physical exam there is presence of tenderness in this area.  She does not have any red flag signs but I consider she requires to have a evaluation with a colonoscopy as she has not had one done in 19 years and this will help to explore further the abnormalities seen in abdominal imaging.  If this investigations are negative, we may need to repeat  the CT of the abdomen with IV contrast.  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  For now, the patient can take some Benefiber to improve her bowel movement consistency.  On the other hand, she has had recurrent episode of heartburn recently despite taking PPI twice a day.  We will also evaluate this further with an EGD.  - Schedule EGD and colonoscopy - Continue Protonix 40 mg BID -  Start Benefiber fiber supplements daily to increase stool bulk - RTC 6 weeks with Cynthia Pummel, PA  All questions were answered.      Dolores Frame, MD Gastroenterology and Hepatology [facility]

## 2020-04-25 ENCOUNTER — Other Ambulatory Visit: Payer: Self-pay | Admitting: Family

## 2020-04-25 ENCOUNTER — Other Ambulatory Visit (INDEPENDENT_AMBULATORY_CARE_PROVIDER_SITE_OTHER): Payer: Self-pay | Admitting: *Deleted

## 2020-04-30 NOTE — Patient Instructions (Signed)
Your procedure is scheduled on: 05/04/2020  [facility] to [facility] at   9:15  AM.  Call this number if you have problems the morning of surgery: [phone]   Remember:              Follow Directions on the letter you received from Your Physician's office regarding the Bowel Prep              No Smoking the day of Procedure :   Take these medicines the morning of surgery with A SIP OF WATER: pecid, Allegra, flonase, levothyroxine, pantoprazole, zoloft, and              nifedipine   Do not wear jewelry, make-up or nail polish.    Do not bring valuables to the hospital.  Contacts, dentures or bridgework may not be worn into surgery.  .   Patients discharged the day of surgery will not be allowed to drive home.     Colonoscopy, Adult, Care After This sheet gives you information about how to care for yourself after your procedure. Your health care provider may also give you more specific instructions. If you have problems or questions, contact your health care provider. What can I expect after the procedure? After the procedure, it is common to have:  A small amount of blood in your stool for 24 hours after the procedure.  Some gas.  Mild abdominal cramping or bloating.  Follow these instructions at home: General instructions   For the first 24 hours after the procedure: ? Do not drive or use machinery. ? Do not sign important documents. ? Do not drink alcohol. ? Do your regular daily activities at a slower pace than normal. ? Eat soft, easy-to-digest foods. ? Rest often.  Take over-the-counter or prescription medicines only as told by your health care provider.  It is up to you to get the results of your procedure. Ask your health care provider, or the department performing the procedure, when your results will be ready. Relieving cramping and bloating  Try walking around when you have cramps or feel bloated.  Apply heat to your abdomen as told by your health care  provider. Use a heat source that your health care provider recommends, such as a moist heat pack or a heating pad. ? Place a towel between your skin and the heat source. ? Leave the heat on for 20-30 minutes. ? Remove the heat if your skin turns bright red. This is especially important if you are unable to feel pain, heat, or cold. You may have a greater risk of getting burned. Eating and drinking  Drink enough fluid to keep your urine clear or pale yellow.  Resume your normal diet as instructed by your health care provider. Avoid heavy or fried foods that are hard to digest.  Avoid drinking alcohol for as long as instructed by your health care provider. Contact a health care provider if:  You have blood in your stool 2-3 days after the procedure. Get help right away if:  You have more than a small spotting of blood in your stool.  You pass large blood clots in your stool.  Your abdomen is swollen.  You have nausea or vomiting.  You have a fever.  You have increasing abdominal pain that is not relieved with medicine. This information is not intended to replace advice given to you by your health care provider. Make sure you discuss any questions you have with your health  care provider. Document Released: 04/01/2004 Document Revised: 05/12/2016 Document Reviewed: 10/30/2015 Elsevier Interactive Patient Education  2018 [facility], Adult, Care After This sheet gives you information about how to care for yourself after your procedure. Your health care provider may also give you more specific instructions. If you have problems or questions, contact your health care provider. What can I expect after the procedure? After the procedure, it is common to have:  A sore throat.  Mild stomach pain or discomfort.  Bloating.  Nausea. Follow these instructions at home:   Follow instructions from your health care provider about what to eat or drink after your  procedure.  Return to your normal activities as told by your health care provider. Ask your health care provider what activities are safe for you.  Take over-the-counter and prescription medicines only as told by your health care provider.  Do not drive for 24 hours if you were given a sedative during your procedure.  Keep all follow-up visits as told by your health care provider. This is important. Contact a health care provider if you have:  A sore throat that lasts longer than one day.  Trouble swallowing. Get help right away if:  You vomit blood or your vomit looks like coffee grounds.  You have: ? A fever. ? Bloody, black, or tarry stools. ? A severe sore throat or you cannot swallow. ? Difficulty breathing. ? Severe pain in your chest or abdomen. Summary  After the procedure, it is common to have a sore throat, mild stomach discomfort, bloating, and nausea.  Do not drive for 24 hours if you were given a sedative during the procedure.  Follow instructions from your health care provider about what to eat or drink after your procedure.  Return to your normal activities as told by your health care provider. This information is not intended to replace advice given to you by your health care provider. Make sure you discuss any questions you have with your health care provider. Document Revised: 02/09/2018 Document Reviewed: 01/18/2018 Elsevier Patient Education  [place].

## 2020-05-02 ENCOUNTER — Other Ambulatory Visit: Payer: Self-pay

## 2020-05-02 ENCOUNTER — Other Ambulatory Visit (HOSPITAL_COMMUNITY)
Admission: RE | Admit: 2020-05-02 | Discharge: 2020-05-02 | Disposition: A | Discharge status: Home / Self Care | Payer: Medicare Other | Source: Ambulatory Visit | Attending: Gastroenterology | Admitting: Gastroenterology

## 2020-05-02 ENCOUNTER — Encounter (HOSPITAL_COMMUNITY)
Admission: RE | Admit: 2020-05-02 | Discharge: 2020-05-02 | Disposition: A | Discharge status: Home / Self Care | Payer: Medicare Other | Source: Ambulatory Visit | Attending: Gastroenterology | Admitting: Gastroenterology

## 2020-05-02 ENCOUNTER — Encounter (HOSPITAL_COMMUNITY): Payer: Self-pay

## 2020-05-02 DIAGNOSIS — Z20822 Contact with and (suspected) exposure to covid-19: Secondary | ICD-10-CM | POA: Insufficient documentation

## 2020-05-02 DIAGNOSIS — I10 Essential (primary) hypertension: Secondary | ICD-10-CM | POA: Diagnosis not present

## 2020-05-02 DIAGNOSIS — Z01818 Encounter for other preprocedural examination: Secondary | ICD-10-CM | POA: Diagnosis not present

## 2020-05-02 LAB — SARS CORONAVIRUS 2 (TAT 6-24 HRS): SARS Coronavirus 2: NEGATIVE

## 2020-05-04 ENCOUNTER — Encounter (HOSPITAL_COMMUNITY): Payer: Self-pay | Admitting: Gastroenterology

## 2020-05-04 ENCOUNTER — Ambulatory Visit (HOSPITAL_COMMUNITY): Payer: Medicare Other | Admitting: Anesthesiology

## 2020-05-04 ENCOUNTER — Encounter (HOSPITAL_COMMUNITY)
Admission: RE | Disposition: A | Discharge status: Home / Self Care | Payer: Self-pay | Source: Home / Self Care | Attending: Gastroenterology

## 2020-05-04 ENCOUNTER — Other Ambulatory Visit: Payer: Self-pay

## 2020-05-04 ENCOUNTER — Ambulatory Visit (HOSPITAL_COMMUNITY)
Admission: RE | Admit: 2020-05-04 | Discharge: 2020-05-04 | Disposition: A | Discharge status: Home / Self Care | Payer: Medicare Other | Attending: Gastroenterology | Admitting: Gastroenterology

## 2020-05-04 DIAGNOSIS — Z885 Allergy status to narcotic agent status: Secondary | ICD-10-CM | POA: Insufficient documentation

## 2020-05-04 DIAGNOSIS — Z888 Allergy status to other drugs, medicaments and biological substances status: Secondary | ICD-10-CM | POA: Diagnosis not present

## 2020-05-04 DIAGNOSIS — Q438 Other specified congenital malformations of intestine: Secondary | ICD-10-CM | POA: Diagnosis not present

## 2020-05-04 DIAGNOSIS — K2950 Unspecified chronic gastritis without bleeding: Secondary | ICD-10-CM | POA: Insufficient documentation

## 2020-05-04 DIAGNOSIS — Z79890 Hormone replacement therapy (postmenopausal): Secondary | ICD-10-CM | POA: Diagnosis not present

## 2020-05-04 DIAGNOSIS — K648 Other hemorrhoids: Secondary | ICD-10-CM | POA: Insufficient documentation

## 2020-05-04 DIAGNOSIS — Z880 Allergy status to penicillin: Secondary | ICD-10-CM | POA: Diagnosis not present

## 2020-05-04 DIAGNOSIS — F329 Major depressive disorder, single episode, unspecified: Secondary | ICD-10-CM | POA: Diagnosis not present

## 2020-05-04 DIAGNOSIS — E039 Hypothyroidism, unspecified: Secondary | ICD-10-CM | POA: Diagnosis not present

## 2020-05-04 DIAGNOSIS — E785 Hyperlipidemia, unspecified: Secondary | ICD-10-CM | POA: Insufficient documentation

## 2020-05-04 DIAGNOSIS — Z79899 Other long term (current) drug therapy: Secondary | ICD-10-CM | POA: Insufficient documentation

## 2020-05-04 DIAGNOSIS — K259 Gastric ulcer, unspecified as acute or chronic, without hemorrhage or perforation: Secondary | ICD-10-CM

## 2020-05-04 DIAGNOSIS — K449 Diaphragmatic hernia without obstruction or gangrene: Secondary | ICD-10-CM | POA: Diagnosis not present

## 2020-05-04 DIAGNOSIS — M797 Fibromyalgia: Secondary | ICD-10-CM | POA: Diagnosis not present

## 2020-05-04 DIAGNOSIS — E119 Type 2 diabetes mellitus without complications: Secondary | ICD-10-CM | POA: Diagnosis not present

## 2020-05-04 DIAGNOSIS — K633 Ulcer of intestine: Secondary | ICD-10-CM | POA: Insufficient documentation

## 2020-05-04 DIAGNOSIS — Z8249 Family history of ischemic heart disease and other diseases of the circulatory system: Secondary | ICD-10-CM | POA: Diagnosis not present

## 2020-05-04 DIAGNOSIS — R12 Heartburn: Secondary | ICD-10-CM | POA: Diagnosis not present

## 2020-05-04 DIAGNOSIS — K5180 Other ulcerative colitis without complications: Secondary | ICD-10-CM | POA: Diagnosis not present

## 2020-05-04 DIAGNOSIS — K219 Gastro-esophageal reflux disease without esophagitis: Secondary | ICD-10-CM | POA: Diagnosis not present

## 2020-05-04 DIAGNOSIS — Z7984 Long term (current) use of oral hypoglycemic drugs: Secondary | ICD-10-CM | POA: Insufficient documentation

## 2020-05-04 DIAGNOSIS — K3189 Other diseases of stomach and duodenum: Secondary | ICD-10-CM

## 2020-05-04 DIAGNOSIS — K2100 Gastro-esophageal reflux disease with esophagitis, without bleeding: Secondary | ICD-10-CM | POA: Insufficient documentation

## 2020-05-04 DIAGNOSIS — K317 Polyp of stomach and duodenum: Secondary | ICD-10-CM | POA: Diagnosis not present

## 2020-05-04 DIAGNOSIS — R109 Unspecified abdominal pain: Secondary | ICD-10-CM | POA: Diagnosis not present

## 2020-05-04 DIAGNOSIS — K5000 Crohn's disease of small intestine without complications: Secondary | ICD-10-CM | POA: Diagnosis not present

## 2020-05-04 DIAGNOSIS — Z87891 Personal history of nicotine dependence: Secondary | ICD-10-CM | POA: Insufficient documentation

## 2020-05-04 DIAGNOSIS — K5520 Angiodysplasia of colon without hemorrhage: Secondary | ICD-10-CM | POA: Insufficient documentation

## 2020-05-04 DIAGNOSIS — K529 Noninfective gastroenteritis and colitis, unspecified: Secondary | ICD-10-CM

## 2020-05-04 DIAGNOSIS — R1031 Right lower quadrant pain: Secondary | ICD-10-CM

## 2020-05-04 DIAGNOSIS — I10 Essential (primary) hypertension: Secondary | ICD-10-CM | POA: Diagnosis not present

## 2020-05-04 HISTORY — PX: BIOPSY: SHX5522

## 2020-05-04 HISTORY — PX: COLONOSCOPY WITH PROPOFOL: SHX5780

## 2020-05-04 HISTORY — PX: ESOPHAGOGASTRODUODENOSCOPY (EGD) WITH PROPOFOL: SHX5813

## 2020-05-04 LAB — GLUCOSE, CAPILLARY: Glucose-Capillary: 124 mg/dL — ABNORMAL HIGH (ref 70–99)

## 2020-05-04 SURGERY — COLONOSCOPY WITH PROPOFOL
Anesthesia: General

## 2020-05-04 MED ORDER — PROPOFOL 10 MG/ML IV BOLUS
INTRAVENOUS | Status: DC | PRN
  Administered 2020-05-04 (×3): 20 mg via INTRAVENOUS
  Administered 2020-05-04: 40 mg via INTRAVENOUS
  Administered 2020-05-04 (×3): 20 mg via INTRAVENOUS
  Administered 2020-05-04: 50 mg via INTRAVENOUS
  Administered 2020-05-04 (×3): 20 mg via INTRAVENOUS
  Administered 2020-05-04: 10 mg via INTRAVENOUS
  Administered 2020-05-04: 40 mg via INTRAVENOUS
  Administered 2020-05-04: 20 mg via INTRAVENOUS

## 2020-05-04 MED ORDER — LIDOCAINE VISCOUS HCL 2 % MT SOLN
15.0000 mL | Freq: Once | OROMUCOSAL | Status: AC
  Administered 2020-05-04: 15 mL via OROMUCOSAL

## 2020-05-04 MED ORDER — GLYCOPYRROLATE 0.2 MG/ML IJ SOLN
INTRAMUSCULAR | Status: DC | PRN
  Administered 2020-05-04: .2 mg via INTRAVENOUS

## 2020-05-04 MED ORDER — STERILE WATER FOR IRRIGATION IR SOLN
Status: DC | PRN
  Administered 2020-05-04: 1.5 mL

## 2020-05-04 MED ORDER — LACTATED RINGERS IV SOLN
Freq: Once | INTRAVENOUS | Status: AC
  Administered 2020-05-04: 1000 mL via INTRAVENOUS

## 2020-05-04 MED ORDER — GLYCOPYRROLATE PF 0.2 MG/ML IJ SOSY
PREFILLED_SYRINGE | INTRAMUSCULAR | Status: AC
  Filled 2020-05-04: qty 1

## 2020-05-04 MED ORDER — PROPOFOL 10 MG/ML IV BOLUS
INTRAVENOUS | Status: AC
  Filled 2020-05-04: qty 60

## 2020-05-04 MED ORDER — PROPOFOL 10 MG/ML IV BOLUS
INTRAVENOUS | Status: AC
  Filled 2020-05-04: qty 20

## 2020-05-04 MED ORDER — PROPOFOL 500 MG/50ML IV EMUL
INTRAVENOUS | Status: DC | PRN
  Administered 2020-05-04: 150 ug/kg/min via INTRAVENOUS

## 2020-05-04 MED ORDER — FENTANYL CITRATE (PF) 100 MCG/2ML IJ SOLN
INTRAMUSCULAR | Status: AC
  Filled 2020-05-04: qty 2

## 2020-05-04 MED ORDER — LIDOCAINE VISCOUS HCL 2 % MT SOLN
OROMUCOSAL | Status: AC
  Filled 2020-05-04: qty 15

## 2020-05-04 MED ORDER — LACTATED RINGERS IV SOLN: INTRAVENOUS | Status: DC | PRN

## 2020-05-04 NOTE — Transfer of Care (Signed)
Immediate Anesthesia Transfer of Care Note  Patient: Cynthia Mays  Procedure(s) Performed: COLONOSCOPY WITH PROPOFOL (N/A ) ESOPHAGOGASTRODUODENOSCOPY (EGD) WITH PROPOFOL (N/A ) BIOPSY  Patient Location: PACU  Anesthesia Type:General  Level of Consciousness: awake, alert , oriented and patient cooperative  Airway & Oxygen Therapy: Patient Spontanous Breathing  Post-op Assessment: Report given to RN, Post -op Vital signs reviewed and stable and Patient moving all extremities X 4  Post vital signs: Reviewed and stable  Last Vitals:  Vitals Value Taken Time  BP    Temp    Pulse 69 05/04/20 1142  Resp 16 05/04/20 1142  SpO2 91 % 05/04/20 1142  Vitals shown include unvalidated device data.  Last Pain:  Vitals:   05/04/20 1022  TempSrc:   PainSc: 0-No pain         Complications: No complications documented.

## 2020-05-04 NOTE — Anesthesia Postprocedure Evaluation (Signed)
Anesthesia Post Note  Patient: Cynthia Mays  Procedure(s) Performed: COLONOSCOPY WITH PROPOFOL (N/A ) ESOPHAGOGASTRODUODENOSCOPY (EGD) WITH PROPOFOL (N/A ) BIOPSY  Patient location during evaluation: PACU Anesthesia Type: General Level of consciousness: awake, oriented, awake and alert and patient cooperative Pain management: satisfactory to patient Vital Signs Assessment: post-procedure vital signs reviewed and stable Respiratory status: spontaneous breathing, respiratory function stable and nonlabored ventilation Cardiovascular status: stable Postop Assessment: no apparent nausea or vomiting Anesthetic complications: no   No complications documented.   Last Vitals:  Vitals:   05/04/20 0934  BP: 138/74  Pulse: (!) 52  Resp: 15  Temp: 36.5 C  SpO2: 97%    Last Pain:  Vitals:   05/04/20 1022  TempSrc:   PainSc: 0-No pain                 Amariah Kierstead

## 2020-05-04 NOTE — Anesthesia Preprocedure Evaluation (Addendum)
Anesthesia Evaluation  Patient identified by MRN, date of birth, ID band  Reviewed: Allergy & Precautions, NPO status , Patient's Chart, lab work & pertinent test results  History of Anesthesia Complications (+) PONV and history of anesthetic complications  Airway Mallampati: II  TM Distance: >3 FB Neck ROM: Full    Dental  (+) Edentulous Upper, Edentulous Lower   Pulmonary former smoker,    Pulmonary exam normal breath sounds clear to auscultation       Cardiovascular Exercise Tolerance: Good hypertension, Pt. on medications + CAD   Rhythm:Irregular Rate:Bradycardia - Systolic murmurs, - Diastolic murmurs, - Friction Rub, - Carotid Bruit, - Peripheral Edema and - Systolic Click    Neuro/Psych PSYCHIATRIC DISORDERS Anxiety Depression  Neuromuscular disease    GI/Hepatic Neg liver ROS, hiatal hernia, GERD  Medicated and Controlled,  Endo/Other  diabetes, Well Controlled, Type 2, Oral Hypoglycemic AgentsHypothyroidism   Renal/GU negative Renal ROS  negative genitourinary   Musculoskeletal  (+) Fibromyalgia -  Abdominal   Peds  Hematology negative hematology ROS (+)   Anesthesia Other Findings   Reproductive/Obstetrics                           Anesthesia Physical Anesthesia Plan  ASA: III  Anesthesia Plan: General   Post-op Pain Management:    Induction: Intravenous  PONV Risk Score and Plan: TIVA  Airway Management Planned: Natural Airway and Nasal Cannula  Additional Equipment:   Intra-op Plan:   Post-operative Plan:   Informed Consent: I have reviewed the patients History and Physical, chart, labs and discussed the procedure including the risks, benefits and alternatives for the proposed anesthesia with the patient or authorized representative who has indicated his/her understanding and acceptance.       Plan Discussed with: CRNA and Surgeon  Anesthesia Plan Comments:         Anesthesia Quick Evaluation

## 2020-05-04 NOTE — H&P (Signed)
Cynthia Mays is an 79 y.o. female.   Chief Complaint: Abdominal pain and heartburn HPI: 79 y.o. female with past medical history of depression, diabetes, fibromyalgia, GERD, hyperlipidemia, hypertension hypothyroidism, who presents for follow up of right lower quadrant abdominal pain and heartburn. The patient was recently hospitalized after presenting an episode of enterocolitis which was evidence in CT scan for which she received a week course of antibiotics. Stated her bowel movements become more regular but she is still having right lower quadrant abdominal pain until today. Last colonoscopy was performed in 2002. No family history of colon cancer. Also, the patient has persisted with episodes of heartburn on a daily basis despite the fact she is taking pantoprazole 40 mg twice a day and famotidine once daily. Her most recent EGD was 1018 showed a hiatal hernia 3 cm with grade a reflux esophagitis.  Past Medical History:  Diagnosis Date  . Complication of anesthesia   . Depression   . Diabetes mellitus   . Fibromyalgia   . GERD (gastroesophageal reflux disease)   . Hiatal hernia   . Hyperlipidemia   . Hypertension   . Hypothyroidism   . Hypothyroidism 07/23/2016  . PONV (postoperative nausea and vomiting)     Past Surgical History:  Procedure Laterality Date  . APPENDECTOMY  2002  . arthroscopy  right 2002, left 2007   bilateral knees  . BACK SURGERY  10/2007, 09/2008   x2  . BIOPSY  10/02/2016   Procedure: BIOPSY;  Surgeon: Malissa Hippo, MD;  Location: [facility];  Service: Endoscopy;;  gastric  . CARPAL TUNNEL RELEASE  1986   bilateral  . CESAREAN SECTION  1969, 1967   x2  . ESOPHAGOGASTRODUODENOSCOPY N/A 10/02/2016   Procedure: ESOPHAGOGASTRODUODENOSCOPY (EGD);  Surgeon: Malissa Hippo, MD;  Location: [facility];  Service: Endoscopy;  Laterality: N/A;  11:15 - moved to 2/1 @ 3:00  . KNEE SURGERY     both    Family History  Problem Relation Age of Onset  .  Anesthesia problems Mother   . CAD Mother 61  . Cancer Mother        Pancreatic cancer  . Hypertension Father   . Suicidality Father        gunshot  . Deafness Sister   . Heart disease Brother   . Suicidality Son   . Cancer Sister        female   . Suicidality Brother   . Hypotension Neg Hx   . Pseudochol deficiency Neg Hx   . Malignant hyperthermia Neg Hx    Social History:  reports that she quit smoking about 50 years ago. Her smoking use included cigarettes. She has a 43.00 pack-year smoking history. She has never used smokeless tobacco. She reports that she does not drink alcohol and does not use drugs.  Allergies:  Allergies  Allergen Reactions  . Codeine Other (See Comments)    Headache, gi upset  . Penicillins Rash    Has patient had a PCN reaction causing immediate rash, facial/tongue/throat swelling, SOB or lightheadedness with hypotension: Yes Has patient had a PCN reaction causing severe rash involving mucus membranes or skin necrosis: No Has patient had a PCN reaction that required hospitalization No Has patient had a PCN reaction occurring within the last 10 years: No If all of the above answers are "NO", then may proceed with Cephalosporin use.   . Pravastatin     Leg cramps    Medications Prior to  Admission  Medication Sig Dispense Refill  . cholecalciferol (VITAMIN D3) 25 MCG (1000 UNIT) tablet Take 1,000 Units by mouth daily.     . clobetasol cream (TEMOVATE) 0.05 % Apply 1 application topically daily as needed (rash). 30 g 11  . famotidine (PEPCID) 40 MG tablet Take 1 tablet (40 mg total) by mouth at bedtime. 90 tablet 1  . fexofenadine (ALLEGRA) 180 MG tablet Take 180 mg by mouth daily.    . fluticasone (FLONASE) 50 MCG/ACT nasal spray Place 2 sprays into the nose daily.     Marland Kitchen levothyroxine (EUTHYROX) 112 MCG tablet TAKE 1 TABLET BY MOUTH ONCE DAILY BEFORE BREAKFAST (Patient taking differently: Take 112 mcg by mouth daily before breakfast. ) 90 tablet 3   . lisinopril (ZESTRIL) 10 MG tablet Take 0.5 tablets (5 mg total) by mouth daily. 90 tablet 1  . metFORMIN (GLUCOPHAGE) 500 MG tablet Take 1 tablet (500 mg total) by mouth 2 (two) times daily. 180 tablet 1  . Multiple Vitamins-Minerals (MULTIVITAMINS THER. W/MINERALS) TABS Take 1 tablet by mouth daily.      Marland Kitchen NIFEdipine (ADALAT CC) 60 MG 24 hr tablet Take 1 tablet by mouth once daily 90 tablet 0  . ondansetron (ZOFRAN ODT) 8 MG disintegrating tablet Take 1 tablet (8 mg total) by mouth every 8 (eight) hours as needed for nausea or vomiting. 20 tablet 0  . pantoprazole (PROTONIX) 40 MG tablet Take 40 mg by mouth 2 (two) times daily.    Marland Kitchen PLENVU 140 g SOLR TAKE 1 MOUTH FOR ONE DOSE 3 each 0  . Probiotic Product (PHILLIPS COLON HEALTH) CAPS Take 1 capsule by mouth daily.     . rosuvastatin (CRESTOR) 5 MG tablet Take 1 tablet (5 mg total) by mouth daily. 90 tablet 1  . sertraline (ZOLOFT) 50 MG tablet TAKE 1 & 1/2 (ONE & ONE-HALF) TABLETS BY MOUTH ONCE DAILY 135 tablet 0  . diazepam (VALIUM) 5 MG tablet Take 5 mg by mouth every 6 (six) hours as needed for anxiety or muscle spasms.      Results for orders placed or performed during the hospital encounter of 05/04/20 (from the past 48 hour(s))  Glucose, capillary     Status: Abnormal   Collection Time: 05/04/20  9:54 AM  Result Value Ref Range   Glucose-Capillary 124 (H) 70 - 99 mg/dL    Comment: Glucose reference range applies only to samples taken after fasting for at least 8 hours.   No results found.  Review of Systems  Constitutional: Negative.   HENT: Negative.   Eyes: Negative.   Respiratory: Negative.   Cardiovascular: Negative.   Gastrointestinal: Positive for abdominal pain.  Endocrine: Negative.   Genitourinary: Negative.   Musculoskeletal: Negative.   Skin: Negative.   Allergic/Immunologic: Negative.   Neurological: Negative.   Hematological: Negative.   Psychiatric/Behavioral: Negative.     Blood pressure 138/74, pulse  (!) 52, temperature 97.7 F (36.5 C), temperature source Oral, resp. rate 15, height 4\' 11"  (1.499 m), weight 81.2 kg, SpO2 97 %. Physical Exam  GENERAL: The patient is AO x3, in no acute distress. HEENT: Head is normocephalic and atraumatic. EOMI are intact. Mouth is well hydrated and without lesions. NECK: Supple. No masses LUNGS: Clear to auscultation. No presence of rhonchi/wheezing/rales. Adequate chest expansion HEART: RRR, normal s1 and s2. ABDOMEN: Soft, nontender, no guarding, no peritoneal signs, and nondistended. BS +. No masses. EXTREMITIES: Without any cyanosis, clubbing, rash, lesions or edema. NEUROLOGIC: AOx3, no focal motor  deficit. SKIN: no jaundice, no rashes  Assessment/Plan 79 y.o. female with past medical history of depression, diabetes, fibromyalgia, GERD, hyperlipidemia, hypertension hypothyroidism, who presents for follow up of right lower quadrant abdominal pain and heartburn. We will proceed with colonoscopy with terminal ileum intubation and EGD.  Dolores Frame, MD 05/04/2020, 10:21 AM

## 2020-05-04 NOTE — Discharge Instructions (Signed)
You are being discharged to home.  Resume your previous diet.  Continue your present medications.  We are waiting for your pathology results. . Your physician has indicated that a repeat colonoscopy is not recommended due to your current age (34 years or older) for screening purposes.    Colonoscopy, Adult, Care After This sheet gives you information about how to care for yourself after your procedure. Your doctor may also give you more specific instructions. If you have problems or questions, call your doctor. What can I expect after the procedure? After the procedure, it is common to have:  A small amount of blood in your poop (stool) for 24 hours.  Some gas.  Mild cramping or bloating in your belly (abdomen). Follow these instructions at home: Eating and drinking   Drink enough fluid to keep your pee (urine) pale yellow.  Follow instructions from your doctor about what you cannot eat or drink.  Return to your normal diet as told by your doctor. Avoid heavy or fried foods that are hard to digest. Activity  Rest as told by your doctor.  Do not sit for a long time without moving. Get up to take short walks every 1-2 hours. This is important. Ask for help if you feel weak or unsteady.  Return to your normal activities as told by your doctor. Ask your doctor what activities are safe for you. To help cramping and bloating:   Try walking around.  Put heat on your belly as told by your doctor. Use the heat source that your doctor recommends, such as a moist heat pack or a heating pad. ? Put a towel between your skin and the heat source. ? Leave the heat on for 20-30 minutes. ? Remove the heat if your skin turns bright red. This is very important if you are unable to feel pain, heat, or cold. You may have a greater risk of getting burned. General instructions  For the first 24 hours after the procedure: ? Do not drive or use machinery. ? Do not sign important documents. ? Do  not drink alcohol. ? Do your daily activities more slowly than normal. ? Eat foods that are soft and easy to digest.  Take over-the-counter or prescription medicines only as told by your doctor.  Keep all follow-up visits as told by your doctor. This is important. Contact a doctor if:  You have blood in your poop 2-3 days after the procedure. Get help right away if:  You have more than a small amount of blood in your poop.  You see large clumps of tissue (blood clots) in your poop.  Your belly is swollen.  You feel like you may vomit (nauseous).  You vomit.  You have a fever.  You have belly pain that gets worse, and medicine does not help your pain. Summary  After the procedure, it is common to have a small amount of blood in your poop. You may also have mild cramping and bloating in your belly.  For the first 24 hours after the procedure, do not drive or use machinery, do not sign important documents, and do not drink alcohol.  Get help right away if you have a lot of blood in your poop, feel like you may vomit, have a fever, or have more belly pain. This information is not intended to replace advice given to you by your health care provider. Make sure you discuss any questions you have with your health care provider. Document Revised:  03/14/2019 Document Reviewed: 03/14/2019 Elsevier Patient Education  2020 Elsevier Inc[employer]   Upper Endoscopy, Adult, Care After This sheet gives you information about how to care for yourself after your procedure. Your health care provider may also give you more specific instructions. If you have problems or questions, contact your health care provider. What can I expect after the procedure? After the procedure, it is common to have:  A sore throat.  Mild stomach pain or discomfort.  Bloating.  Nausea. Follow these instructions at home:   Follow instructions from your health care provider about what to eat or drink after your  procedure.  Return to your normal activities as told by your health care provider. Ask your health care provider what activities are safe for you.  Take over-the-counter and prescription medicines only as told by your health care provider.  Do not drive for 24 hours if you were given a sedative during your procedure.  Keep all follow-up visits as told by your health care provider. This is important. Contact a health care provider if you have:  A sore throat that lasts longer than one day.  Trouble swallowing. Get help right away if:  You vomit blood or your vomit looks like coffee grounds.  You have: ? A fever. ? Bloody, black, or tarry stools. ? A severe sore throat or you cannot swallow. ? Difficulty breathing. ? Severe pain in your chest or abdomen. Summary  After the procedure, it is common to have a sore throat, mild stomach discomfort, bloating, and nausea.  Do not drive for 24 hours if you were given a sedative during the procedure.  Follow instructions from your health care provider about what to eat or drink after your procedure.  Return to your normal activities as told by your health care provider. This information is not intended to replace advice given to you by your health care provider. Make sure you discuss any questions you have with your health care provider. Document Revised: 02/09/2018 Document Reviewed: 01/18/2018 Elsevier Patient Education  2020 [employer] Inc.  Monitored Anesthesia Care, Care After These instructions provide you with information about caring for yourself after your procedure. Your health care provider may also give you more specific instructions. Your treatment has been planned according to current medical practices, but problems sometimes occur. Call your health care provider if you have any problems or questions after your procedure. What can I expect after the procedure? After your procedure, you may:  Feel sleepy for several  hours.  Feel clumsy and have poor balance for several hours.  Feel forgetful about what happened after the procedure.  Have poor judgment for several hours.  Feel nauseous or vomit.  Have a sore throat if you had a breathing tube during the procedure. Follow these instructions at home: For at least 24 hours after the procedure:      Have a responsible adult stay with you. It is important to have someone help care for you until you are awake and alert.  Rest as needed.  Do not: ? Participate in activities in which you could fall or become injured. ? Drive. ? Use heavy machinery. ? Drink alcohol. ? Take sleeping pills or medicines that cause drowsiness. ? Make important decisions or sign legal documents. ? Take care of children on your own. Eating and drinking  Follow the diet that is recommended by your health care provider.  If you vomit, drink water, juice, or soup when you can drink without vomiting.  Make sure you have little or no nausea before eating solid foods. General instructions  Take over-the-counter and prescription medicines only as told by your health care provider.  If you have sleep apnea, surgery and certain medicines can increase your risk for breathing problems. Follow instructions from your health care provider about wearing your sleep device: ? Anytime you are sleeping, including during daytime naps. ? While taking prescription pain medicines, sleeping medicines, or medicines that make you drowsy.  If you smoke, do not smoke without supervision.  Keep all follow-up visits as told by your health care provider. This is important. Contact a health care provider if:  You keep feeling nauseous or you keep vomiting.  You feel light-headed.  You develop a rash.  You have a fever. Get help right away if:  You have trouble breathing. Summary  For several hours after your procedure, you may feel sleepy and have poor judgment.  Have a  responsible adult stay with you for at least 24 hours or until you are awake and alert. This information is not intended to replace advice given to you by your health care provider. Make sure you discuss any questions you have with your health care provider. Document Revised: 11/16/2017 Document Reviewed: 12/09/2015 Elsevier Patient Education  2020 Else[employer] Inc.  Monitored Anesthesia Care, Care After These instructions provide you with information about caring for yourself after your procedure. Your health care provider may also give you more specific instructions. Your treatment has been planned according to current medical practices, but problems sometimes occur. Call your health care provider if you have any problems or questions after your procedure. What can I expect after the procedure? After your procedure, you may:  Feel sleepy for several hours.  Feel clumsy and have poor balance for several hours.  Feel forgetful about what happened after the procedure.  Have poor judgment for several hours.  Feel nauseous or vomit.  Have a sore throat if you had a breathing tube during the procedure. Follow these instructions at home: For at least 24 hours after the procedure:      Have a responsible adult stay with you. It is important to have someone help care for you until you are awake and alert.  Rest as needed.  Do not: ? Participate in activities in which you could fall or become injured. ? Drive. ? Use heavy machinery. ? Drink alcohol. ? Take sleeping pills or medicines that cause drowsiness. ? Make important decisions or sign legal documents. ? Take care of children on your own. Eating and drinking  Follow the diet that is recommended by your health care provider.  If you vomit, drink water, juice, or soup when you can drink without vomiting.  Make sure you have little or no nausea before eating solid foods. General instructions  Take over-the-counter and  prescription medicines only as told by your health care provider.  If you have sleep apnea, surgery and certain medicines can increase your risk for breathing problems. Follow instructions from your health care provider about wearing your sleep device: ? Anytime you are sleeping, including during daytime naps. ? While taking prescription pain medicines, sleeping medicines, or medicines that make you drowsy.  If you smoke, do not smoke without supervision.  Keep all follow-up visits as told by your health care provider. This is important. Contact a health care provider if:  You keep feeling nauseous or you keep vomiting.  You feel light-headed.  You develop a rash.  You  have a fever. Get help right away if:  You have trouble breathing. Summary  For several hours after your procedure, you may feel sleepy and have poor judgment.  Have a responsible adult stay with you for at least 24 hours or until you are awake and alert. This information is not intended to replace advice given to you by your health care provider. Make sure you discuss any questions you have with your health care provider. Document Revised: 11/16/2017 Document Reviewed: 12/09/2015 Elsevier Patient Education  2020 [employer].

## 2020-05-04 NOTE — Op Note (Signed)
[facility] Patient Name: Cynthia Mays Procedure Date: 05/04/2020 10:43 AM MRN: [id number] Date of Birth: [birth year] Attending MD: [name] ,  CSN: [id number] Age: 79 Admit Type: Outpatient Procedure:                Colonoscopy Indications:              Abdominal pain and enterocolitis Providers:                [name], Jannett Celestine, RN, Sheran Fava, Cyril Mourning, Technician, Edythe Clarity, Technician Referring MD:              Medicines:                Monitored Anesthesia Care Complications:            No immediate complications. Estimated Blood Loss:     Estimated blood loss: none. Procedure:                Pre-Anesthesia Assessment:                           - Prior to the procedure, a History and Physical                            was performed, and patient medications, allergies                            and sensitivities were reviewed. The patient's                            tolerance of previous anesthesia was reviewed.                           - The risks and benefits of the procedure and the                            sedation options and risks were discussed with the                            patient. All questions were answered and informed                            consent was obtained.                           - ASA Grade Assessment: III - A patient with severe                            systemic disease.                           After obtaining informed consent, the colonoscope  was passed under direct vision. Throughout the                            procedure, the patient's blood pressure, pulse, and                            oxygen saturations were monitored continuously. The                            [id number]-[id number] ([id number]) scope was introduced through                            the anus and advanced to the the terminal ileum (20                             cm upstream of IC valve). The patient tolerated the                            procedure well. The colonoscopy was technically                            difficult and complex due to significant looping.                            Successful completion of the procedure was aided by                            applying abdominal pressure. Scope withdrawal time                            was 15 minutes. The quality of the bowel                            preparation was good. Scope In: 10:46:34 AM Scope Out: 11:36:27 AM Scope Withdrawal Time: 0 hours 12 minutes 32 seconds  Total Procedure Duration: 0 hours 49 minutes 53 seconds  Findings:      The terminal ileum appeared normal. Biopsies were taken with a cold       forceps for histology.      A single (solitary) eight mm ulcer was found in the cecum. This was       surrounded by erythema. There was a clot attached to it but upon lavage,       no vessel was observed - clean based ulcer but with oozing from sides.       No bleeding was present. Stigmata of recent bleeding were present.       Biopsies were taken with a cold forceps for histology, rule out CMV and       HSV.      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      The ascending colon was significantly tortuous.      Non-bleeding internal hemorrhoids were found during retroflexion. The       hemorrhoids were medium-sized. Impression:               -  The examined portion of the ileum was normal.                            Biopsied.                           - A single (solitary) ulcer in the cecum. Biopsied.                           - A single non-bleeding colonic angiodysplastic                            lesion.                           - Tortuous colon.                           - Non-bleeding internal hemorrhoids. Moderate Sedation:      Per Anesthesia Care Recommendation:           - Discharge patient to home  (ambulatory).                           - Resume previous diet.                           - Await pathology results.                           - Repeat colonoscopy is not recommended due to                            current age (59 years or older) for screening                            purposes. Procedure Code(s):        --- Professional ---                           [id number], GC, Colonoscopy, flexible; with biopsy,                            single or multiple Diagnosis Code(s):        --- Professional ---                           K63.3, Ulcer of intestine                           Q43.8, Other specified congenital malformations of                            intestine CPT copyright 2019 [employer]. All rights reserved. The codes documented in this report are preliminary and upon coder review may  be revised to meet current compliance requirements. [name], MD [name],  05/04/2020 11:57:56 AM This report has been signed electronically. Number of Addenda: 0

## 2020-05-04 NOTE — Op Note (Signed)
[facility] Patient Name: Cynthia Mays Procedure Date: 05/04/2020 9:59 AM MRN: [id number] Date of Birth: 02-Oct-1940 Attending MD: [name] ,  CSN: [id number] Age: 79 Admit Type: Outpatient Procedure:                Upper GI endoscopy Indications:              Gastro-esophageal reflux disease Providers:                [name], Jannett Celestine, RN, Sheran Fava, Cyril Mourning, Technician,                            Burke Keels, Technician Referring MD:              Medicines:                Monitored Anesthesia Care Complications:            No immediate complications. Estimated Blood Loss:     Estimated blood loss: none. Procedure:                Pre-Anesthesia Assessment:                           - Prior to the procedure, a History and Physical                            was performed, and patient medications, allergies                            and sensitivities were reviewed. The patient's                            tolerance of previous anesthesia was reviewed.                           - The risks and benefits of the procedure and the                            sedation options and risks were discussed with the                            patient. All questions were answered and informed                            consent was obtained.                           - ASA Grade Assessment: III - A patient with severe                            systemic disease.                           After obtaining informed consent, the endoscope was  passed under direct vision. Throughout the                            procedure, the patient's blood pressure, pulse, and                            oxygen saturations were monitored continuously. The                            [id number]-H190 ([id number]) scope was introduced through the                            mouth, and advanced to the second part of duodenum.                             The upper GI endoscopy was accomplished without                            difficulty. The patient tolerated the procedure                            well. Scope In: 10:29:27 AM Scope Out: 10:40:50 AM Total Procedure Duration: 0 hours 11 minutes 23 seconds  Findings:      The examined esophagus was normal.      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 The proximal       extent of the gastric folds (end of tubular esophagus) was 40 cm from       the incisors. The Z-line was 36 cm from the incisors.      Multiple 2 to 5 mm sessile polyps with no bleeding and no stigmata of       recent bleeding were found in the gastric fundus and in the gastric       body. 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 Biopsies were taken with       a cold forceps for histology.      Diffuse erythematous and edematous mucosa without bleeding was found at       the pylorus. Biopsies were taken with a cold forceps for Helicobacter       pylori testing.      The examined duodenum was normal. Impression:               - Normal esophagus.                           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                           - Multiple gastric polyps. Biopsied.                           - Erythematous mucosa in the pylorus. Biopsied.                           - Normal examined duodenum. Moderate Sedation:      Per Anesthesia Care Recommendation:           -  Discharge patient to home (ambulatory).                           - Resume previous diet.                           - Continue present medications.                           - Refer for hiatal hernia repair.                           - Await pathology results. Procedure Code(s):        --- Professional ---                           [id number], GC, Esophagogastroduodenoscopy, flexible,                            transoral; with biopsy, single or multiple Diagnosis Code(s):        --- Professional ---                            K44.9, Diaphragmatic hernia without obstruction or                            gangrene                           K25.9, Gastric ulcer, unspecified as acute or                            chronic, without hemorrhage or perforation                           K31.7, Polyp of stomach and duodenum                           K31.89, Other diseases of stomach and duodenum                           K21.9, Gastro-esophageal reflux disease without                            esophagitis CPT copyright 2019 American Medical Association. All rights reserved. The codes documented in this report are preliminary and upon coder review may  be revised to meet current compliance requirements. [name], MD [name],  05/04/2020 11:51:14 AM This report has been signed electronically. Number of Addenda: 0

## 2020-05-08 ENCOUNTER — Other Ambulatory Visit: Payer: Self-pay

## 2020-05-09 ENCOUNTER — Encounter (HOSPITAL_COMMUNITY): Payer: Self-pay | Admitting: Gastroenterology

## 2020-05-10 ENCOUNTER — Other Ambulatory Visit: Payer: Self-pay

## 2020-05-10 ENCOUNTER — Ambulatory Visit (INDEPENDENT_AMBULATORY_CARE_PROVIDER_SITE_OTHER): Payer: Medicare Other | Admitting: Family

## 2020-05-10 ENCOUNTER — Encounter: Payer: Self-pay | Admitting: Family

## 2020-05-10 VITALS — BP 142/77 | HR 51 | Temp 97.4°F | Ht 59.0 in | Wt 185.2 lb

## 2020-05-10 DIAGNOSIS — I959 Hypotension, unspecified: Secondary | ICD-10-CM | POA: Diagnosis not present

## 2020-05-10 LAB — SURGICAL PATHOLOGY

## 2020-05-10 NOTE — Progress Notes (Signed)
   Subjective:    Patient ID: Cynthia Mays, female    DOB: 01/30/1941, 79 y.o.   MRN: [id number]  Chief Complaint  Patient presents with  . Blood Pressure Check    is low, has not taken BP med this morning    HPI Pt presents to the office today with complaints of hypotension. She reports every afternoon her BP will 90s/40's. She is currently taking Nifedipine 60 mg in AM and Lisinopril 5 mg at night.   She reports she has not taken any BP medication today. States when her BP drops dizzy, light headed, and fatigue.    Review of Systems  All other systems reviewed and are negative.      Objective:   Physical Exam Vitals reviewed.  Constitutional:      General: She is not in acute distress.    Appearance: She is well-developed.  HENT:     Head: Normocephalic and atraumatic.  Eyes:     Pupils: Pupils are equal, round, and reactive to light.  Neck:     Thyroid: No thyromegaly.  Cardiovascular:     Rate and Rhythm: Normal rate and regular rhythm.     Heart sounds: Normal heart sounds. No murmur heard.   Pulmonary:     Effort: Pulmonary effort is normal. No respiratory distress.     Breath sounds: Normal breath sounds. No wheezing.  Abdominal:     General: Bowel sounds are normal. There is no distension.     Palpations: Abdomen is soft.     Tenderness: There is no abdominal tenderness.  Musculoskeletal:        General: No tenderness. Normal range of motion.     Cervical back: Normal range of motion and neck supple.  Skin:    General: Skin is warm and dry.  Neurological:     Mental Status: She is alert and oriented to person, place, and time.     Cranial Nerves: No cranial nerve deficit.     Deep Tendon Reflexes: Reflexes are normal and symmetric.  Psychiatric:        Behavior: Behavior normal.        Thought Content: Thought content normal.        Judgment: Judgment normal.       BP (!) 142/77   Pulse (!) 51   Temp (!) 97.4 F (36.3 C) (Temporal)   Ht  4\' 11"  (1.499 m)   Wt 185 lb 3.2 oz (84 kg)   SpO2 95%   BMI 37.41 kg/m       Assessment & Plan:  1. Hypotension, unspecified hypotension type Will stop Nifedipine 60 mg  Continue Lisinopril 5 mg  Bradycardia right now, could be from CCB, if still low will do EKG and referral to Cardiologists in 2 weeks RTO in 2 weeks, but call if BP is elevated  , FNP

## 2020-05-10 NOTE — Patient Instructions (Signed)
Hypotension As your heart beats, it forces blood through your body. Hypotension, commonly called low blood pressure, is when the force of blood pumping through your arteries is too weak. Arteries are blood vessels that carry blood from the heart throughout the body. Depending on the cause and severity, hypotension may be harmless (benign) or may cause serious problems (be critical). When blood pressure is too low, you may not get enough blood to your brain or to the rest of your organs. This can cause weakness, light-headedness, rapid heartbeat, and fainting. What are the causes? This condition may be caused by:  Blood loss.  Loss of body fluids (dehydration).  Heart problems.  Hormone (endocrine) problems.  Pregnancy.  Severe infection.  Lack of certain nutrients.  Severe allergic reactions (anaphylaxis).  Certain medicines, such as blood pressure medicine or medicines that make the body lose excess fluids (diuretics). Sometimes, hypotension may be caused by not taking medicine as directed, such as taking too much of a certain medicine. What increases the risk? The following factors may make you more likely to develop this condition:  Age. Risk increases as you get older.  Conditions that affect the heart or the central nervous system.  Taking certain medicines, such as blood pressure medicine or diuretics.  Being pregnant. What are the signs or symptoms? Common symptoms of this condition include:  Weakness.  Light-headedness.  Dizziness.  Blurred vision.  Fatigue.  Rapid heartbeat.  Fainting, in severe cases. How is this diagnosed? This condition is diagnosed based on:  Your medical history.  Your symptoms.  Your blood pressure measurement. Your health care provider will check your blood pressure when you are: ? Lying down. ? Sitting. ? Standing. A blood pressure reading is recorded as two numbers, such as "120 over 80" (or 120/80). The first ("top")  number is called the systolic pressure. It is a measure of the pressure in your arteries as your heart beats. The second ("bottom") number is called the diastolic pressure. It is a measure of the pressure in your arteries when your heart relaxes between beats. Blood pressure is measured in a unit called mm Hg. Healthy blood pressure for most adults is 120/80. If your blood pressure is below 90/60, you may be diagnosed with hypotension. Other information or tests that may be used to diagnose hypotension include:  Your other vital signs, such as your heart rate and temperature.  Blood tests.  Tilt table test. For this test, you will be safely secured to a table that moves you from a lying position to an upright position. Your heart rhythm and blood pressure will be monitored during the test. How is this treated? Treatment for this condition may include:  Changing your diet. This may involve eating more salt (sodium) or drinking more water.  Taking medicines to raise your blood pressure.  Changing the dosage of certain medicines you are taking that might be lowering your blood pressure.  Wearing compression stockings. These stockings help to prevent blood clots and reduce swelling in your legs. In some cases, you may need to go to the hospital for:  Fluid replacement. This means you will receive fluids through an IV.  Blood replacement. This means you will receive donated blood through an IV (transfusion).  Treating an infection or heart problems, if this applies.  Monitoring. You may need to be monitored while medicines that you are taking wear off. Follow these instructions at home: Eating and drinking   Drink enough fluid to keep your   urine pale yellow.  Eat a healthy diet, and follow instructions from your health care provider about eating or drinking restrictions. A healthy diet includes: ? Fresh fruits and vegetables. ? Whole grains. ? Lean meats. ? Low-fat dairy  products.  Eat extra salt only as directed. Do not add extra salt to your diet unless your health care provider told you to do that.  Eat frequent, small meals.  Avoid standing up suddenly after eating. Medicines  Take over-the-counter and prescription medicines only as told by your health care provider. ? Follow instructions from your health care provider about changing the dosage of your current medicines, if this applies. ? Do not stop or adjust any of your medicines on your own. General instructions   Wear compression stockings as told by your health care provider.  Get up slowly from lying down or sitting positions. This gives your blood pressure a chance to adjust.  Avoid hot showers and excessive heat as directed by your health care provider.  Return to your normal activities as told by your health care provider. Ask your health care provider what activities are safe for you.  Do not use any products that contain nicotine or tobacco, such as cigarettes, e-cigarettes, and chewing tobacco. If you need help quitting, ask your health care provider.  Keep all follow-up visits as told by your health care provider. This is important. Contact a health care provider if you:  Vomit.  Have diarrhea.  Have a fever for more than 2-3 days.  Feel more thirsty than usual.  Feel weak and tired. Get help right away if you:  Have chest pain.  Have a fast or irregular heartbeat.  Develop numbness in any part of your body.  Cannot move your arms or your legs.  Have trouble speaking.  Become sweaty or feel light-headed.  Faint.  Feel short of breath.  Have trouble staying awake.  Feel confused. Summary  Hypotension is when the force of blood pumping through your arteries is too weak.  Hypotension may be harmless (benign) or may cause serious problems (be critical).  Treatment for this condition may include changing your diet, changing your medicines, and wearing  compression stockings.  In some cases, you may need to go to the hospital for fluid or blood replacement. This information is not intended to replace advice given to you by your health care provider. Make sure you discuss any questions you have with your health care provider. Document Revised: 02/11/2018 Document Reviewed: 02/11/2018 Elsevier Patient Education  2020 [employer]  

## 2020-05-11 ENCOUNTER — Telehealth (INDEPENDENT_AMBULATORY_CARE_PROVIDER_SITE_OTHER): Payer: Self-pay | Admitting: Gastroenterology

## 2020-05-11 ENCOUNTER — Other Ambulatory Visit (INDEPENDENT_AMBULATORY_CARE_PROVIDER_SITE_OTHER): Payer: Self-pay | Admitting: Gastroenterology

## 2020-05-11 DIAGNOSIS — K633 Ulcer of intestine: Secondary | ICD-10-CM

## 2020-05-11 DIAGNOSIS — K529 Noninfective gastroenteritis and colitis, unspecified: Secondary | ICD-10-CM

## 2020-05-11 MED ORDER — MESALAMINE 1.2 G PO TBEC: 2.4000 g | DELAYED_RELEASE_TABLET | Freq: Every day | ORAL | 2 refills | Status: DC

## 2020-05-11 NOTE — Telephone Encounter (Signed)
I called the patient to inform her about the results of her recent biopsies which showed gastric inflammation without presence of H. pylori, no other acute alterations, there were presence of fundic gland polyps.  Also, biopsies of the ulcer located in the cecum were positive for chronic colitis with ulceration but negative for granulomas or dysplasia.  Stains were negative for CMV.  I explained to her that besides her referral to be evaluated by surgeon for her hiatal hernia, I will start her on mesalamine for a few months to assess any improvement in her symptoms.  I would like to check a GI path stool test.  The patient understood and agreed.  Tammy, can you please help me sending her the stool test order? Needs follow up appointment in two months.  No need for repeat colonoscopy for now.  [name], MD Gastroenterology and Hepatology [facility]

## 2020-05-14 ENCOUNTER — Other Ambulatory Visit (INDEPENDENT_AMBULATORY_CARE_PROVIDER_SITE_OTHER): Payer: Self-pay | Admitting: *Deleted

## 2020-05-14 ENCOUNTER — Telehealth (INDEPENDENT_AMBULATORY_CARE_PROVIDER_SITE_OTHER): Payer: Self-pay | Admitting: Gastroenterology

## 2020-05-14 DIAGNOSIS — K529 Noninfective gastroenteritis and colitis, unspecified: Secondary | ICD-10-CM

## 2020-05-14 DIAGNOSIS — R197 Diarrhea, unspecified: Secondary | ICD-10-CM

## 2020-05-14 NOTE — Telephone Encounter (Signed)
Thanks

## 2020-05-14 NOTE — Telephone Encounter (Signed)
GI Pathogen has been ordered and the patient was made aware. She plans to come today and get the container at [facility].  Mitzie - per [facility] the patient will need OV in 2 months with him.

## 2020-05-14 NOTE — Telephone Encounter (Signed)
Patient came by office had questions about her stool studies - please advise - ph# [phone]

## 2020-05-15 NOTE — Telephone Encounter (Signed)
Called patient to discuss her question about the stool studies, she did not pick up the phone. Left voice message to call back.  [name], MD Gastroenterology and Hepatology [facility]

## 2020-05-22 DIAGNOSIS — M79672 Pain in left foot: Secondary | ICD-10-CM | POA: Diagnosis not present

## 2020-05-22 DIAGNOSIS — M7742 Metatarsalgia, left foot: Secondary | ICD-10-CM | POA: Diagnosis not present

## 2020-05-24 ENCOUNTER — Encounter: Payer: Self-pay | Admitting: Family

## 2020-05-24 ENCOUNTER — Ambulatory Visit (INDEPENDENT_AMBULATORY_CARE_PROVIDER_SITE_OTHER): Payer: Medicare Other | Admitting: Family

## 2020-05-24 ENCOUNTER — Other Ambulatory Visit: Payer: Self-pay

## 2020-05-24 VITALS — BP 111/76 | HR 67 | Temp 97.3°F | Ht 59.0 in | Wt 182.4 lb

## 2020-05-24 DIAGNOSIS — Z23 Encounter for immunization: Secondary | ICD-10-CM

## 2020-05-24 DIAGNOSIS — G8929 Other chronic pain: Secondary | ICD-10-CM

## 2020-05-24 DIAGNOSIS — I10 Essential (primary) hypertension: Secondary | ICD-10-CM | POA: Diagnosis not present

## 2020-05-24 DIAGNOSIS — M5441 Lumbago with sciatica, right side: Secondary | ICD-10-CM

## 2020-05-24 MED ORDER — HYDROCODONE-ACETAMINOPHEN 5-325 MG PO TABS: 1.0000 | ORAL_TABLET | Freq: Four times a day (QID) | ORAL | 0 refills | Status: DC | PRN

## 2020-05-24 NOTE — Progress Notes (Signed)
Subjective:    Patient ID: Cynthia Mays, female    DOB: 08/27/1941, 79 y.o.   MRN: [id number]  Pt presents to the office today to recheck BP. Her BP is at goal today. She was seen two weeks ago and found to have bradycardia of 51. We held her Nifedipine 60 mg and continued her lisinopril 5 mg. She states her BP was elevated to 170's and restarted her medication. Her BP and pulse today are WNL.  Hypertension This is a chronic problem. The current episode started more than 1 year ago. The problem has been resolved since onset. The problem is controlled. Associated symptoms include malaise/fatigue. Pertinent negatives include no peripheral edema or shortness of breath. The current treatment provides moderate improvement.  Back Pain This is a chronic problem. The current episode started more than 1 year ago. The problem occurs intermittently. The problem has been waxing and waning since onset. The pain is present in the lumbar spine. The quality of the pain is described as aching. The pain is at a severity of 9/10. The pain is moderate. She has tried analgesics for the symptoms. The treatment provided mild relief.      Review of Systems  Constitutional: Positive for malaise/fatigue.  Respiratory: Negative for shortness of breath.   Musculoskeletal: Positive for back pain.  All other systems reviewed and are negative.      Objective:   Physical Exam Vitals reviewed.  Constitutional:      General: She is not in acute distress.    Appearance: She is well-developed.  HENT:     Head: Normocephalic and atraumatic.     Right Ear: Tympanic membrane normal.     Left Ear: Tympanic membrane normal.  Eyes:     Pupils: Pupils are equal, round, and reactive to light.  Neck:     Thyroid: No thyromegaly.  Cardiovascular:     Rate and Rhythm: Normal rate and regular rhythm.     Heart sounds: Normal heart sounds. No murmur heard.   Pulmonary:     Effort: Pulmonary effort is normal. No  respiratory distress.     Breath sounds: Normal breath sounds. No wheezing.  Abdominal:     General: Bowel sounds are normal. There is no distension.     Palpations: Abdomen is soft.     Tenderness: There is abdominal tenderness (mild lower abdominal ).  Musculoskeletal:        General: No tenderness.     Cervical back: Normal range of motion and neck supple.     Comments: Pain in lumbar with flexion and extension  Skin:    General: Skin is warm and dry.  Neurological:     Mental Status: She is alert and oriented to person, place, and time.     Cranial Nerves: No cranial nerve deficit.     Deep Tendon Reflexes: Reflexes are normal and symmetric.  Psychiatric:        Behavior: Behavior normal.        Thought Content: Thought content normal.        Judgment: Judgment normal.          BP 111/76   Pulse 67   Temp (!) 97.3 F (36.3 C) (Temporal)   Ht 4\' 11"  (1.499 m)   Wt 182 lb 6.4 oz (82.7 kg)   BMI 36.84 kg/m   Assessment & Plan:  Cynthia Mays comes in today with chief complaint of Hypertension   Diagnosis and orders addressed:  1. 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 Essential hypertension Continue current medications     [name], FNP

## 2020-05-24 NOTE — Patient Instructions (Signed)
Bradycardia, Adult Bradycardia is a slower-than-normal heartbeat. A normal resting heart rate for an adult ranges from 60 to 100 beats per minute. With bradycardia, the resting heart rate is less than 60 beats per minute. Bradycardia can prevent enough oxygen from reaching certain areas of your body when you are active. It can be serious if it keeps enough oxygen from reaching your brain and other parts of your body. Bradycardia is not a problem for everyone. For some healthy adults, a slow resting heart rate is normal. What are the causes? This condition may be caused by:  A problem with the heart, including: ? A problem with the heart's electrical system, such as a heart block. With a heart block, electrical signals between the chambers of the heart are partially or completely blocked, so they are not able to work as they should. ? A problem with the heart's natural pacemaker (sinus node). ? Heart disease. ? A heart attack. ? Heart damage. ? Lyme disease. ? A heart infection. ? A heart condition that is present at birth (congenital heart defect).  Certain medicines that treat heart conditions.  Certain conditions, such as hypothyroidism and obstructive sleep apnea.  Problems with the balance of chemicals and other substances, like potassium, in the blood.  Trauma.  Radiation therapy. What increases the risk? You are more likely to develop this condition if you:  Are age 65 or older.  Have high blood pressure (hypertension), high cholesterol (hyperlipidemia), or diabetes.  Drink heavily, use tobacco or nicotine products, or use drugs. What are the signs or symptoms? Symptoms of this condition include:  Light-headedness.  Feeling faint or fainting.  Fatigue and weakness.  Trouble with activity or exercise.  Shortness of breath.  Chest pain (angina).  Drowsiness.  Confusion.  Dizziness. How is this diagnosed? This condition may be diagnosed based on:  Your  symptoms.  Your medical history.  A physical exam. During the exam, your health care provider will listen to your heartbeat and check your pulse. To confirm the diagnosis, your health care provider may order tests, such as:  Blood tests.  An electrocardiogram (ECG). This test records the heart's electrical activity. The test can show how fast your heart is beating and whether the heartbeat is steady.  A test in which you wear a portable device (event recorder or Holter monitor) to record your heart's electrical activity while you go about your day.  Anexercise test. How is this treated? Treatment for this condition depends on the cause of the condition and how severe your symptoms are. Treatment may involve:  Treatment of the underlying condition.  Changing your medicines or how much medicine you take.  Having a small, battery-operated device called a pacemaker implanted under the skin. When bradycardia occurs, this device can be used to increase your heart rate and help your heart beat in a regular rhythm. Follow these instructions at home: Lifestyle   Manage any health conditions that contribute to bradycardia as told by your health care provider.  Follow a heart-healthy diet. A nutrition specialist (dietitian) can help educate you about healthy food options and changes.  Follow an exercise program that is approved by your health care provider.  Maintain a healthy weight.  Try to reduce or manage your stress, such as with yoga or meditation. If you need help reducing stress, ask your health care provider.  Do not use any products that contain nicotine or tobacco, such as cigarettes, e-cigarettes, and chewing tobacco. If you need help   quitting, ask your health care provider.  Do not use illegal drugs.  Limit alcohol intake to no more than 1 drink a day for nonpregnant women and 2 drinks a day for men. Be aware of how much alcohol is in your drink. In the [place] one drink  equals one 12 oz bottle of beer (355 mL), one 5 oz glass of wine (148 mL), or one 1 oz glass of hard liquor (44 mL). General instructions  Take over-the-counter and prescription medicines only as told by your health care provider.  Keep all follow-up visits as told by your health care provider. This is important. How is this prevented? In some cases, bradycardia may be prevented by:  Treating underlying medical problems.  Stopping behaviors or medicines that can trigger the condition. Contact a health care provider if you:  Feel light-headed or dizzy.  Almost faint.  Feel weak or are easily fatigued during physical activity.  Experience confusion or have memory problems. Get help right away if:  You faint.  You have: ? An irregular heartbeat (palpitations). ? Chest pain. ? Trouble breathing. Summary  Bradycardia is a slower-than-normal heartbeat. With bradycardia, the resting heart rate is less than 60 beats per minute.  Treatment for this condition depends on the cause.  Manage any health conditions that contribute to bradycardia as told by your health care provider.  Do not use any products that contain nicotine or tobacco, such as cigarettes, e-cigarettes, and chewing tobacco, and limit alcohol intake.  Keep all follow-up visits as told by your health care provider. This is important. This information is not intended to replace advice given to you by your health care provider. Make sure you discuss any questions you have with your health care provider. Document Revised: 03/01/2018 Document Reviewed: 01/27/2018 Elsevier Patient Education  2020 Elsevier Inc.  

## 2020-06-04 ENCOUNTER — Other Ambulatory Visit: Payer: Self-pay | Admitting: Obstetrics & Gynecology

## 2020-06-07 ENCOUNTER — Ambulatory Visit (INDEPENDENT_AMBULATORY_CARE_PROVIDER_SITE_OTHER): Payer: Medicare Other | Admitting: Nurse Practitioner

## 2020-06-07 ENCOUNTER — Encounter: Payer: Self-pay | Admitting: Nurse Practitioner

## 2020-06-07 DIAGNOSIS — R52 Pain, unspecified: Secondary | ICD-10-CM | POA: Diagnosis not present

## 2020-06-07 NOTE — Progress Notes (Signed)
   Virtual Visit via telephone Note Due to COVID-19 pandemic this visit was conducted virtually. This visit type was conducted due to national recommendations for restrictions regarding the COVID-19 Pandemic (e.g. social distancing, sheltering in place) in an effort to limit this patient's exposure and mitigate transmission in our community. All issues noted in this document were discussed and addressed.  A physical exam was not performed with this format.  I connected with Cynthia Mays on 06/07/20 at 1:30 by telephone and verified that I am speaking with the correct person using two identifiers. Cynthia Mays is currently located at home and no one is currently with her during visit. The provider, Mary-Margaret Daphine Deutscher, FNP is located in their office at time of visit.  I discussed the limitations, risks, security and privacy concerns of performing an evaluation and management service by telephone and the availability of in person appointments. I also discussed with the patient that there may be a patient responsible charge related to this service. The patient expressed understanding and agreed to proceed.   History and Present Illness:   Chief Complaint: Generalized Body Aches   HPI Patient calls in for anppointment stating that she flew to [place] a week ag and flew back this past Sunday. She was feeling fine. Developed body aches on  Monday. She says she felt like freight train hit her. She has no other complaints. Denies fever, cough, no loss of taste or smell. No gastric issues. She has taken tylenol and that helps some.  Has had covid vaccine.   Review of Systems  Constitutional: Negative for diaphoresis and weight loss.  Eyes: Negative for blurred vision, double vision and pain.  Respiratory: Negative for shortness of breath.   Cardiovascular: Negative for chest pain, palpitations, orthopnea and leg swelling.  Gastrointestinal: Negative for abdominal pain.  Skin: Negative  for rash.  Neurological: Negative for dizziness, sensory change, loss of consciousness, weakness and headaches.  Endo/Heme/Allergies: Negative for polydipsia. Does not bruise/bleed easily.  Psychiatric/Behavioral: Negative for memory loss. The patient does not have insomnia.   All other systems reviewed and are negative.    Observations/Objective: Alert and oriented- answers all questions appropriately mild distress No cough or sob oted during visit   Assessment and Plan: Cynthia Mays in today with chief complaint of Generalized Body Aches   1. Body aches Motrin or tylenol OTC Force fluids If becomes SOB please let us know Quarantine until test results are back - Novel Coronavirus, NAA ([facility]); Future      Follow Up Instructions: prn    I discussed the assessment and treatment plan with the patient. The patient was provided an opportunity to ask questions and all were answered. The patient agreed with the plan and demonstrated an understanding of the instructions.   The patient was advised to call back or seek an in-person evaluation if the symptoms worsen or if the condition fails to improve as anticipated.  The above assessment and management plan was discussed with the patient. The patient verbalized understanding of and has agreed to the management plan. Patient is aware to call the clinic if symptoms persist or worsen. Patient is aware when to return to the clinic for a follow-up visit. Patient educated on when it is appropriate to go to the emergency department.   Time call ended:  1:49  I provided 19 minutes of non-face-to-face time during this encounter.    Mary-Margaret Daphine Deutscher, FNP

## 2020-06-07 NOTE — Addendum Note (Signed)
Addended by: Manson Passey on: 06/07/2020 02:39 PM   Modules accepted: Orders

## 2020-06-08 LAB — NOVEL CORONAVIRUS, NAA: SARS-CoV-2, NAA: NOT DETECTED

## 2020-06-08 LAB — SARS-COV-2, NAA 2 DAY TAT

## 2020-06-11 DIAGNOSIS — R509 Fever, unspecified: Secondary | ICD-10-CM | POA: Diagnosis not present

## 2020-06-11 DIAGNOSIS — R52 Pain, unspecified: Secondary | ICD-10-CM | POA: Diagnosis not present

## 2020-06-11 DIAGNOSIS — J0100 Acute maxillary sinusitis, unspecified: Secondary | ICD-10-CM | POA: Diagnosis not present

## 2020-06-11 DIAGNOSIS — R110 Nausea: Secondary | ICD-10-CM | POA: Diagnosis not present

## 2020-06-11 DIAGNOSIS — I9589 Other hypotension: Secondary | ICD-10-CM | POA: Diagnosis not present

## 2020-07-03 ENCOUNTER — Ambulatory Visit (INDEPENDENT_AMBULATORY_CARE_PROVIDER_SITE_OTHER): Payer: Medicare Other | Admitting: Orthopaedic Surgery

## 2020-07-03 ENCOUNTER — Other Ambulatory Visit: Payer: Self-pay

## 2020-07-03 ENCOUNTER — Encounter: Payer: Self-pay | Admitting: Orthopaedic Surgery

## 2020-07-03 ENCOUNTER — Ambulatory Visit: Payer: Medicare Other

## 2020-07-03 ENCOUNTER — Encounter (INDEPENDENT_AMBULATORY_CARE_PROVIDER_SITE_OTHER): Payer: Self-pay

## 2020-07-03 VITALS — BP 145/75 | HR 70 | Ht 59.0 in | Wt 180.0 lb

## 2020-07-03 DIAGNOSIS — M25642 Stiffness of left hand, not elsewhere classified: Secondary | ICD-10-CM | POA: Diagnosis not present

## 2020-07-03 DIAGNOSIS — M542 Cervicalgia: Secondary | ICD-10-CM

## 2020-07-03 DIAGNOSIS — S6982XA Other specified injuries of left wrist, hand and finger(s), initial encounter: Secondary | ICD-10-CM | POA: Diagnosis not present

## 2020-07-03 DIAGNOSIS — R600 Localized edema: Secondary | ICD-10-CM | POA: Diagnosis not present

## 2020-07-03 DIAGNOSIS — M20022 Boutonniere deformity of left finger(s): Secondary | ICD-10-CM | POA: Diagnosis not present

## 2020-07-03 DIAGNOSIS — M79645 Pain in left finger(s): Secondary | ICD-10-CM | POA: Diagnosis not present

## 2020-07-03 NOTE — Patient Instructions (Signed)
Call for appointment for therapy [phone]

## 2020-07-03 NOTE — Progress Notes (Signed)
Patient Cynthia Mays, female DOB:03-10-1941, 79 y.o. ASN:[id number]  Chief Complaint  Patient presents with  . Neck Pain    fell 1 and 1/2 weeks ago pain neck since    HPI  Cynthia Mays is a 79 y.o. female who passed out last week and hurt her neck and right shoulder.  She says it was due to her blood pressure medicine. That has been stopped. She still has neck pain and some right shoulder and right upper arm pain.  She has no paresthesias. She has no other injury.  She has not improved despite Tylenol, pain medicine, rest, heat.     Body mass index is 36.36 kg/m.  ROS  Review of Systems  Constitutional: Positive for activity change.  Musculoskeletal: Positive for arthralgias, neck pain and neck stiffness.  All other systems reviewed and are negative.  For Review of Systems, all other systems reviewed and are negative.  The following is a summary of the past history medically, past history surgically, known current medicines, social history and family history.  This information is gathered electronically by the computer from prior information and documentation.  I review this each visit and have found including this information at this point in the chart is beneficial and informative.   Past Medical History:  Diagnosis Date  . Complication of anesthesia   . Depression   . Diabetes mellitus   . Fibromyalgia   . GERD (gastroesophageal reflux disease)   . Hiatal hernia   . Hyperlipidemia   . Hypertension   . Hypothyroidism   . Hypothyroidism 07/23/2016  . PONV (postoperative nausea and vomiting)     Past Surgical History:  Procedure Laterality Date  . APPENDECTOMY  2002  . arthroscopy  right 2002, left 2007   bilateral knees  . BACK SURGERY  10/2007, 09/2008   x2  . BIOPSY  10/02/2016   Procedure: BIOPSY;  Surgeon: Malissa Hippo, MD;  [facility];  Service: Endoscopy;;  gastric  . BIOPSY  05/04/2020   Procedure: BIOPSY;  Surgeon: Marguerita Merles, Reuel Boom, MD;  [facility];  Service: Gastroenterology;;  . Fidela Salisbury RELEASE  1986   bilateral  . CESAREAN SECTION  1969, 1967   x2  . COLONOSCOPY WITH PROPOFOL N/A 05/04/2020   Procedure: COLONOSCOPY WITH PROPOFOL;  Surgeon: Dolores Frame, MD;  [facility];  Service: Gastroenterology;  Laterality: N/A;  11  . ESOPHAGOGASTRODUODENOSCOPY N/A 10/02/2016   Procedure: ESOPHAGOGASTRODUODENOSCOPY (EGD);  Surgeon: Malissa Hippo, MD;  [facility];  Service: Endoscopy;  Laterality: N/A;  11:15 - moved to 2/1 @ 3:00  . ESOPHAGOGASTRODUODENOSCOPY (EGD) WITH PROPOFOL N/A 05/04/2020   Procedure: ESOPHAGOGASTRODUODENOSCOPY (EGD) WITH PROPOFOL;  Surgeon: Dolores Frame, MD;  [facility];  Service: Gastroenterology;  Laterality: N/A;  . KNEE SURGERY     both    Current Outpatient Medications on File Prior to Visit  Medication Sig Dispense Refill  . cholecalciferol (VITAMIN D3) 25 MCG (1000 UNIT) tablet Take 1,000 Units by mouth daily.     . clobetasol cream (TEMOVATE) 0.05 % APPLY 1 APPLICATION TOPICALLY ONCE DAILY AS NEEDED FOR  RASH 30 g 11  . diazepam (VALIUM) 5 MG tablet Take 5 mg by mouth every 6 (six) hours as needed for anxiety or muscle spasms.    . famotidine (PEPCID) 40 MG tablet Take 1 tablet (40 mg total) by mouth at bedtime. 90 tablet 1  . fexofenadine (ALLEGRA) 180  MG tablet Take 180 mg by mouth daily.    . fluticasone (FLONASE) 50 MCG/ACT nasal spray Place 2 sprays into the nose daily.     Marland Kitchen HYDROcodone-acetaminophen (NORCO/VICODIN) 5-325 MG tablet Take 1 tablet by mouth every 6 (six) hours as needed for moderate pain. 30 tablet 0  . levothyroxine (EUTHYROX) 112 MCG tablet TAKE 1 TABLET BY MOUTH ONCE DAILY BEFORE BREAKFAST (Patient taking differently: Take 112 mcg by mouth daily before breakfast. ) 90 tablet 3  . mesalamine (LIALDA) 1.2 g EC tablet Take 2 tablets (2.4 g total) by mouth daily with breakfast. 60  tablet 2  . Multiple Vitamins-Minerals (MULTIVITAMINS THER. W/MINERALS) TABS Take 1 tablet by mouth daily.      . ondansetron (ZOFRAN ODT) 8 MG disintegrating tablet Take 1 tablet (8 mg total) by mouth every 8 (eight) hours as needed for nausea or vomiting. 20 tablet 0  . pantoprazole (PROTONIX) 40 MG tablet Take 40 mg by mouth 2 (two) times daily.    Marland Kitchen PLENVU 140 g SOLR TAKE 1 MOUTH FOR ONE DOSE 3 each 0  . Probiotic Product (PHILLIPS COLON HEALTH) CAPS Take 1 capsule by mouth daily.     . sertraline (ZOLOFT) 50 MG tablet TAKE 1 & 1/2 (ONE & ONE-HALF) TABLETS BY MOUTH ONCE DAILY 135 tablet 0  . lisinopril (ZESTRIL) 10 MG tablet Take 0.5 tablets (5 mg total) by mouth daily. (Patient not taking: Reported on 07/03/2020) 90 tablet 1  . metFORMIN (GLUCOPHAGE) 500 MG tablet Take 1 tablet (500 mg total) by mouth 2 (two) times daily. 180 tablet 1  . rosuvastatin (CRESTOR) 5 MG tablet Take 1 tablet (5 mg total) by mouth daily. 90 tablet 1   No current facility-administered medications on file prior to visit.    Social History   Socioeconomic History  . Marital status: Widowed    Spouse name: Not on file  . Number of children: 4  . Years of education: 20  . Highest education level: 12th grade  Occupational History    Employer: RETIRED  Tobacco Use  . Smoking status: Former Smoker    Packs/day: 1.00    Years: 43.00    Pack years: 43.00    Types: Cigarettes    Quit date: 05/02/1970    Years since quitting: 50.2  . Smokeless tobacco: Never Used  . Tobacco comment: > 20 years quit  Vaping Use  . Vaping Use: Never used  Substance and Sexual Activity  . Alcohol use: No    Comment: occasionally wine  . Drug use: No  . Sexual activity: Yes    Birth control/protection: Post-menopausal  Other Topics Concern  . Not on file  Social History Narrative   Lives alone.  Widowed    1 dog, 2 cats   2 daughters, 1 local   Enjoys dancing and sewing   Social Determinants of Research scientist (physical sciences) Strain:   . Difficulty of Paying Living Expenses: Not on file  Food Insecurity:   . Worried About Programme researcher, broadcasting/film/video in the Last Year: Not on file  . Ran Out of Food in the Last Year: Not on file  Transportation Needs:   . Lack of Transportation (Medical): Not on file  . Lack of Transportation (Non-Medical): Not on file  Physical Activity:   . Days of Exercise per Week: Not on file  . Minutes of Exercise per Session: Not on file  Stress:   . Feeling of Stress : Not on  file  Social Connections:   . Frequency of Communication with Friends and Family: Not on file  . Frequency of Social Gatherings with Friends and Family: Not on file  . Attends Religious Services: Not on file  . Active Member of Clubs or Organizations: Not on file  . Attends Banker Meetings: Not on file  . Marital Status: Not on file  Intimate Partner Violence:   . Fear of Current or Ex-Partner: Not on file  . Emotionally Abused: Not on file  . Physically Abused: Not on file  . Sexually Abused: Not on file    Family History  Problem Relation Age of Onset  . Anesthesia problems Mother   . CAD Mother 28  . Cancer Mother        Pancreatic cancer  . Hypertension Father   . Suicidality Father        gunshot  . Deafness Sister   . Heart disease Brother   . Suicidality Son   . Cancer Sister        female   . Suicidality Brother   . Hypotension Neg Hx   . Pseudochol deficiency Neg Hx   . Malignant hyperthermia Neg Hx     BP (!) 145/75   Pulse 70   Ht 4\' 11"  (1.499 m)   Wt 180 lb (81.6 kg)   BMI 36.36 kg/m   Body mass index is 36.36 kg/m.   All other systems reviewed and are negative.  The following is a summary of the past history medically, past history surgically, known current medicines, social history and family history.  This information is gathered electronically by the computer from prior information and documentation.  I review this each visit and have found including  this information at this point in the chart is beneficial and informative.    Past Medical History:  Diagnosis Date  . Complication of anesthesia   . Depression   . Diabetes mellitus   . Fibromyalgia   . GERD (gastroesophageal reflux disease)   . Hiatal hernia   . Hyperlipidemia   . Hypertension   . Hypothyroidism   . Hypothyroidism 07/23/2016  . PONV (postoperative nausea and vomiting)     Past Surgical History:  Procedure Laterality Date  . APPENDECTOMY  2002  . arthroscopy  right 2002, left 2007   bilateral knees  . BACK SURGERY  10/2007, 09/2008   x2  . BIOPSY  10/02/2016   Procedure: BIOPSY;  Surgeon: 11/30/2016, MD;  [facility];  Service: Endoscopy;;  gastric  . BIOPSY  05/04/2020   Procedure: BIOPSY;  Surgeon: 07/04/2020, Marguerita Merles, MD;  [facility];  Service: Gastroenterology;;  . Reuel Boom RELEASE  1986   bilateral  . CESAREAN SECTION  1969, 1967   x2  . COLONOSCOPY WITH PROPOFOL N/A 05/04/2020   Procedure: COLONOSCOPY WITH PROPOFOL;  Surgeon: 07/04/2020, MD;  [facility];  Service: Gastroenterology;  Laterality: N/A;  11  . ESOPHAGOGASTRODUODENOSCOPY N/A 10/02/2016   Procedure: ESOPHAGOGASTRODUODENOSCOPY (EGD);  Surgeon: 11/30/2016, MD;  [facility];  Service: Endoscopy;  Laterality: N/A;  11:15 - moved to 2/1 @ 3:00  . ESOPHAGOGASTRODUODENOSCOPY (EGD) WITH PROPOFOL N/A 05/04/2020   Procedure: ESOPHAGOGASTRODUODENOSCOPY (EGD) WITH PROPOFOL;  Surgeon: 07/04/2020, MD;  [facility];  Service: Gastroenterology;  Laterality: N/A;  . KNEE SURGERY     both    Family History  Problem Relation Age of  Onset  . Anesthesia problems Mother   . CAD Mother 65  . Cancer Mother        Pancreatic cancer  . Hypertension Father   . Suicidality Father        gunshot  . Deafness Sister   . Heart disease Brother   . Suicidality Son   . Cancer Sister        female   .  Suicidality Brother   . Hypotension Neg Hx   . Pseudochol deficiency Neg Hx   . Malignant hyperthermia Neg Hx     Social History Social History   Tobacco Use  . Smoking status: Former Smoker    Packs/day: 1.00    Years: 43.00    Pack years: 43.00    Types: Cigarettes    Quit date: 05/02/1970    Years since quitting: 50.2  . Smokeless tobacco: Never Used  . Tobacco comment: > 20 years quit  Vaping Use  . Vaping Use: Never used  Substance Use Topics  . Alcohol use: No    Comment: occasionally wine  . Drug use: No    Allergies  Allergen Reactions  . Codeine Other (See Comments)    Headache, gi upset  . Penicillins Rash    Has patient had a PCN reaction causing immediate rash, facial/tongue/throat swelling, SOB or lightheadedness with hypotension: Yes Has patient had a PCN reaction causing severe rash involving mucus membranes or skin necrosis: No Has patient had a PCN reaction that required hospitalization No Has patient had a PCN reaction occurring within the last 10 years: No If all of the above answers are "NO", then may proceed with Cephalosporin use.   . Pravastatin     Leg cramps    Current Outpatient Medications  Medication Sig Dispense Refill  . cholecalciferol (VITAMIN D3) 25 MCG (1000 UNIT) tablet Take 1,000 Units by mouth daily.     . clobetasol cream (TEMOVATE) 0.05 % APPLY 1 APPLICATION TOPICALLY ONCE DAILY AS NEEDED FOR  RASH 30 g 11  . diazepam (VALIUM) 5 MG tablet Take 5 mg by mouth every 6 (six) hours as needed for anxiety or muscle spasms.    . famotidine (PEPCID) 40 MG tablet Take 1 tablet (40 mg total) by mouth at bedtime. 90 tablet 1  . fexofenadine (ALLEGRA) 180 MG tablet Take 180 mg by mouth daily.    . fluticasone (FLONASE) 50 MCG/ACT nasal spray Place 2 sprays into the nose daily.     Marland Kitchen HYDROcodone-acetaminophen (NORCO/VICODIN) 5-325 MG tablet Take 1 tablet by mouth every 6 (six) hours as needed for moderate pain. 30 tablet 0  . levothyroxine  (EUTHYROX) 112 MCG tablet TAKE 1 TABLET BY MOUTH ONCE DAILY BEFORE BREAKFAST (Patient taking differently: Take 112 mcg by mouth daily before breakfast. ) 90 tablet 3  . mesalamine (LIALDA) 1.2 g EC tablet Take 2 tablets (2.4 g total) by mouth daily with breakfast. 60 tablet 2  . Multiple Vitamins-Minerals (MULTIVITAMINS THER. W/MINERALS) TABS Take 1 tablet by mouth daily.      . ondansetron (ZOFRAN ODT) 8 MG disintegrating tablet Take 1 tablet (8 mg total) by mouth every 8 (eight) hours as needed for nausea or vomiting. 20 tablet 0  . pantoprazole (PROTONIX) 40 MG tablet Take 40 mg by mouth 2 (two) times daily.    Marland Kitchen PLENVU 140 g SOLR TAKE 1 MOUTH FOR ONE DOSE 3 each 0  . Probiotic Product (PHILLIPS COLON HEALTH) CAPS Take 1 capsule by mouth daily.     Marland Kitchen  sertraline (ZOLOFT) 50 MG tablet TAKE 1 & 1/2 (ONE & ONE-HALF) TABLETS BY MOUTH ONCE DAILY 135 tablet 0  . lisinopril (ZESTRIL) 10 MG tablet Take 0.5 tablets (5 mg total) by mouth daily. (Patient not taking: Reported on 07/03/2020) 90 tablet 1  . metFORMIN (GLUCOPHAGE) 500 MG tablet Take 1 tablet (500 mg total) by mouth 2 (two) times daily. 180 tablet 1  . rosuvastatin (CRESTOR) 5 MG tablet Take 1 tablet (5 mg total) by mouth daily. 90 tablet 1   No current facility-administered medications for this visit.     Physical Exam  Blood pressure (!) 145/75, pulse 70, height 4\' 11"  (1.499 m), weight 180 lb (81.6 kg).  Constitutional: overall normal hygiene, normal nutrition, well developed, normal grooming, normal body habitus. Assistive device:none  Musculoskeletal: gait and station Limp none, muscle tone and strength are normal, no tremors or atrophy is present.  .  Neurological: coordination overall normal.  Deep tendon reflex/nerve stretch intact.  Sensation normal.  Cranial nerves II-XII intact.   Skin:   Normal overall no scars, lesions, ulcers or rashes. No psoriasis.  Psychiatric: Alert and oriented x 3.  Recent memory intact, remote  memory unclear.  Normal mood and affect. Well groomed.  Good eye contact.  Cardiovascular: overall no swelling, no varicosities, no edema bilaterally, normal temperatures of the legs and arms, no clubbing, cyanosis and good capillary refill.  Neck is tender more on the right, no spasm, decreased ROM. NV intact, grips intact, ROM right shoulder full but tender.  Lymphatic: palpation is normal.  All other systems reviewed and are negative   The patient has been educated about the nature of the problem(s) and counseled on treatment options.  The patient appeared to understand what I have discussed and is in agreement with it.  Encounter Diagnosis  Name Primary?  . Neck pain Yes   X-rays were done of the cervical spine, reported separately.  PLAN Call if any problems.  Precautions discussed.  Continue current medications.   Return to clinic 2 weeks   Begin PT.  Electronically Signed Darreld McleanWayne Tarrance Januszewski, MD 11/2/20219:50 AM

## 2020-07-04 DIAGNOSIS — K449 Diaphragmatic hernia without obstruction or gangrene: Secondary | ICD-10-CM | POA: Diagnosis not present

## 2020-07-04 DIAGNOSIS — K2100 Gastro-esophageal reflux disease with esophagitis, without bleeding: Secondary | ICD-10-CM | POA: Diagnosis not present

## 2020-07-05 ENCOUNTER — Other Ambulatory Visit: Payer: Self-pay

## 2020-07-05 ENCOUNTER — Ambulatory Visit: Payer: Medicare Other | Attending: Orthopaedic Surgery | Admitting: Physical Therapy

## 2020-07-05 DIAGNOSIS — M25611 Stiffness of right shoulder, not elsewhere classified: Secondary | ICD-10-CM | POA: Diagnosis not present

## 2020-07-05 DIAGNOSIS — M25511 Pain in right shoulder: Secondary | ICD-10-CM | POA: Diagnosis not present

## 2020-07-05 DIAGNOSIS — M542 Cervicalgia: Secondary | ICD-10-CM | POA: Diagnosis not present

## 2020-07-05 NOTE — Therapy (Signed)
[facility] [address] [place], [place], [address] Phone: [phone]   Fax:  [phone]  Physical Therapy Evaluation  Patient Details  Name: Cynthia Mays MRN: [id number] Date of Birth: Dec 09, 1940 Referring Provider (PT): Darreld Mclean.   Encounter Date: 07/05/2020   PT End of Session - 07/05/20 1418    Visit Number 1    Number of Visits 12    Date for PT Re-Evaluation 08/16/20    PT Start Time 0101    PT Stop Time 0146    PT Time Calculation (min) 45 min    Activity Tolerance Patient tolerated treatment well    Behavior During Therapy [facility] for tasks assessed/performed           Past Medical History:  Diagnosis Date  . Complication of anesthesia   . Depression   . Diabetes mellitus   . Fibromyalgia   . GERD (gastroesophageal reflux disease)   . Hiatal hernia   . Hyperlipidemia   . Hypertension   . Hypothyroidism   . Hypothyroidism 07/23/2016  . PONV (postoperative nausea and vomiting)     Past Surgical History:  Procedure Laterality Date  . APPENDECTOMY  2002  . arthroscopy  right 2002, left 2007   bilateral knees  . BACK SURGERY  10/2007, 09/2008   x2  . BIOPSY  10/02/2016   Procedure: BIOPSY;  Surgeon: Malissa Hippo, MD;  [facility];  Service: Endoscopy;;  gastric  . BIOPSY  05/04/2020   Procedure: BIOPSY;  Surgeon: Marguerita Merles, Reuel Boom, MD;  [facility];  Service: Gastroenterology;;  . Fidela Salisbury RELEASE  1986   bilateral  . CESAREAN SECTION  1969, 1967   x2  . COLONOSCOPY WITH PROPOFOL N/A 05/04/2020   Procedure: COLONOSCOPY WITH PROPOFOL;  Surgeon: Dolores Frame, MD;  [facility];  Service: Gastroenterology;  Laterality: N/A;  11  . ESOPHAGOGASTRODUODENOSCOPY N/A 10/02/2016   Procedure: ESOPHAGOGASTRODUODENOSCOPY (EGD);  Surgeon: Malissa Hippo, MD;  [facility];  Service: Endoscopy;  Laterality: N/A;  11:15 - moved to 2/1 @ 3:00  .  ESOPHAGOGASTRODUODENOSCOPY (EGD) WITH PROPOFOL N/A 05/04/2020   Procedure: ESOPHAGOGASTRODUODENOSCOPY (EGD) WITH PROPOFOL;  Surgeon: Dolores Frame, MD;  [facility];  Service: Gastroenterology;  Laterality: N/A;  . KNEE SURGERY     both    There were no vitals filed for this visit.    Subjective Assessment - 07/05/20 1413    Subjective COVID-19 screen performed prior to patient entering clinic.  The patient presents to the clinic today s/p fall about 2 1/2 weeks ago resultig in neck and right shoudler pain.  She also injured a left finger and it is braced.  Her pain-level today is a 6/10 but can rise to higher levels with movement.  Ice and heat decrease her pain.    Pertinent History DM, Fibromyalgia, Back surgery, CTS, Hypothyroidism, knee surgery.    Currently in Pain? Yes    Pain Score 6     Pain Location Neck   Right shoulder.   Pain Orientation Right;Left    Pain Descriptors / Indicators Aching;Sore    Pain Type Acute pain    Pain Onset 1 to 4 weeks ago    Pain Frequency Constant    Aggravating Factors  See above.    Pain Relieving Factors See above.              [facility] PT Assessment - 07/05/20 0001  Assessment   Medical Diagnosis Neck pain.    Referring Provider (PT) Darreld Mclean.    Onset Date/Surgical Date --   ~2 1/2 weeks.     Precautions   Precautions None      Restrictions   Weight Bearing Restrictions No      Balance Screen   Has the patient fallen in the past 6 months Yes    How many times? --   1.  Related to medication.  Off med now per patient.   Has the patient had a decrease in activity level because of a fear of falling?  No      Home Environment   Living Environment Private residence      Prior Function   Level of Independence Independent      Posture/Postural Control   Posture/Postural Control Postural limitations    Postural Limitations Rounded Shoulders;Forward head      Deep Tendon Reflexes   DTR Assessment  Site Biceps;Brachioradialis;Triceps    Biceps DTR 1+    Brachioradialis DTR 1+    Triceps DTR 1+      ROM / Strength   AROM / PROM / Strength AROM;Strength      AROM   Overall AROM Comments Active left cervical rotation= 35 degrees and right= 52 degrees.  Right shoulder flexion limited to 90 degrees and ER to 40 degrees.      Strength   Overall Strength Comments Right shoulder IR/ER is essentially normal.  Deltoid strength not graded higher than a 4-/5 which may be decreased in response to pain.        Palpation   Palpation comment Very tender diffusely over patient's bilateral cervical paraspinal musculature and especially over her right UT.  She is tender to palpaltion also over her right bicipital groove.      Ambulation/Gait   Gait Comments WNL.                      Objective measurements completed on examination: See above findings.       [facility] Adult PT Treatment/Exercise - 07/05/20 0001      Modalities   Modalities Electrical Stimulation;Moist Heat      Moist Heat Therapy   Number Minutes Moist Heat 15 Minutes    Moist Heat Location Cervical      Electrical Stimulation   Electrical Stimulation Location Bilateral cervical musculature.    Electrical Stimulation Action IFC    Electrical Stimulation Parameters 80-150 Hz x 20 minutes at 100% scan.    Electrical Stimulation Goals Tone;Pain                  PT Education - 07/05/20 1429    Education Details Wall climbs.    Person(s) Educated Patient    Methods Explanation;Demonstration    Comprehension Verbalized understanding;Returned demonstration               PT Long Term Goals - 07/05/20 1432      PT LONG TERM GOAL #1   Title Independent with a HEP.    Time 6    Period Weeks    Status New      PT LONG TERM GOAL #2   Title Increase active cervical rotation to 60-65 degrees+ so patient can turn head more easily while driving.    Time 6    Period Weeks    Status New      PT  LONG TERM GOAL #3   Title Active right  shoulder flexion to 135 degrees so the patient can easily reach overhead.    Time 6    Period Weeks    Status New      PT LONG TERM GOAL #4   Title Active ER to 70 degrees+ to allow for easily donning/doffing of apparel.    Time 6    Period Weeks    Status New      PT LONG TERM GOAL #5   Title Perform ADL's with pain not > 2-3/10.    Time 6    Period Weeks    Status New                  Plan - 07/05/20 1419    Clinical Impression Statement The patient presents to OPPT with c/o neck and right shoulder pain after a fall about two and a half weeks ago reporting due to a medication she had been taking.  She was found to be very tender over her bilateral cervical paraspinal musculature and especially her right UT and Bicipital groove.  Her right shoulder and cervical range of motion is limited.  She was instructed in a home exercise to begin improving her right shoulder range of motion.  Patient will benefit from skilled physical therapy intervention to address deficits and pain.    Personal Factors and Comorbidities Comorbidity 1;Comorbidity 2    Comorbidities DM, Fibromyalgia, Back surgery, CTS, Hypothyroidism, knee surgery.    Examination-Activity Limitations Reach Overhead;Other    Examination-Participation Restrictions Other    Stability/Clinical Decision Making Evolving/Moderate complexity    Clinical Decision Making Low    Rehab Potential Good    PT Frequency 2x / week    PT Duration 6 weeks    PT Treatment/Interventions ADLs/Self Care Home Management;Cryotherapy;Electrical Stimulation;Ultrasound;Moist Heat;Therapeutic activities;Therapeutic exercise;Manual techniques;Patient/family education;Passive range of motion;Dry needling;Vasopneumatic Device    PT Next Visit Plan FOTO please.  Modalites and STW/M to patient's neck and right shoulder.  Begin P and AAROM right shoulder range of motion and establish a HEP.  Chin tucks, cervical  extension and bilateral active rotation.    Consulted and Agree with Plan of Care Patient           Patient will benefit from skilled therapeutic intervention in order to improve the following deficits and impairments:  Pain, Increased muscle spasms, Decreased activity tolerance, Decreased strength, Postural dysfunction, Decreased range of motion  Visit Diagnosis: Cervicalgia - Plan: PT plan of care cert/re-cert  Acute pain of right shoulder - Plan: PT plan of care cert/re-cert  Stiffness of right shoulder, not elsewhere classified - Plan: PT plan of care cert/re-cert     Problem List Patient Active Problem List   Diagnosis Date Noted  . Right lower quadrant abdominal pain 04/19/2020  . Epigastric pain   . Enterocolitis 02/26/2020  . Diarrhea 08/03/2019  . Epigastric abdominal tenderness 05/30/2019  . Obesity, Class III, BMI 40-49.9 (morbid obesity) (HCC) 03/31/2019  . Chronic use of benzodiazepine for therapeutic purpose 01/26/2018  . Hiatal hernia 10/29/2017  . Hyperlipidemia associated with type 2 diabetes mellitus (HCC) 12/31/2015  . Coronary artery disease due to lipid rich plaque 12/31/2015  . Left knee pain 10/24/2015  . 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 Essential hypertension 02/20/2015  . Chronic low back pain with sciatica 02/20/2015  . Gastroesophageal reflux disease without esophagitis 11/20/2014  . Anxiety, generalized 11/20/2014  . Hypothyroidism 12/27/2013  . Type 2 diabetes mellitus without complication, without long-term current use of insulin (HCC) 12/27/2013  .  Precordial pain 01/15/2013    Brittnye Josephs, [place] MPT 07/05/2020, 2:36 PM  [facility] [address] [place], [place], [address] Phone: [phone]   Fax:  [phone]  Name: Cynthia Mays MRN: [id number] Date of Birth: 23-Dec-1940

## 2020-07-10 ENCOUNTER — Ambulatory Visit: Payer: Medicare Other | Admitting: *Deleted

## 2020-07-10 ENCOUNTER — Other Ambulatory Visit: Payer: Self-pay

## 2020-07-10 DIAGNOSIS — M25511 Pain in right shoulder: Secondary | ICD-10-CM | POA: Diagnosis not present

## 2020-07-10 DIAGNOSIS — M25611 Stiffness of right shoulder, not elsewhere classified: Secondary | ICD-10-CM

## 2020-07-10 DIAGNOSIS — M542 Cervicalgia: Secondary | ICD-10-CM | POA: Diagnosis not present

## 2020-07-10 NOTE — Therapy (Signed)
[facility] [address] [place], [place], [address] Phone: [phone]   Fax:  [phone]  Physical Therapy Treatment  Patient Details  Name: Cynthia Mays MRN: [id number] Date of Birth: 11-28-1940 Referring Provider (PT): Darreld Mclean.   Encounter Date: 07/10/2020   PT End of Session - 07/10/20 1122    Visit Number 2    Number of Visits 12    Date for PT Re-Evaluation 08/16/20    PT Start Time 1115    PT Stop Time 1205    PT Time Calculation (min) 50 min           Past Medical History:  Diagnosis Date  . Complication of anesthesia   . Depression   . Diabetes mellitus   . Fibromyalgia   . GERD (gastroesophageal reflux disease)   . Hiatal hernia   . Hyperlipidemia   . Hypertension   . Hypothyroidism   . Hypothyroidism 07/23/2016  . PONV (postoperative nausea and vomiting)     Past Surgical History:  Procedure Laterality Date  . APPENDECTOMY  2002  . arthroscopy  right 2002, left 2007   bilateral knees  . BACK SURGERY  10/2007, 09/2008   x2  . BIOPSY  10/02/2016   Procedure: BIOPSY;  Surgeon: Malissa Hippo, MD;  [facility];  Service: Endoscopy;;  gastric  . BIOPSY  05/04/2020   Procedure: BIOPSY;  Surgeon: Marguerita Merles, Reuel Boom, MD;  [facility];  Service: Gastroenterology;;  . Fidela Salisbury RELEASE  1986   bilateral  . CESAREAN SECTION  1969, 1967   x2  . COLONOSCOPY WITH PROPOFOL N/A 05/04/2020   Procedure: COLONOSCOPY WITH PROPOFOL;  Surgeon: Dolores Frame, MD;  [facility];  Service: Gastroenterology;  Laterality: N/A;  11  . ESOPHAGOGASTRODUODENOSCOPY N/A 10/02/2016   Procedure: ESOPHAGOGASTRODUODENOSCOPY (EGD);  Surgeon: Malissa Hippo, MD;  [facility];  Service: Endoscopy;  Laterality: N/A;  11:15 - moved to 2/1 @ 3:00  . ESOPHAGOGASTRODUODENOSCOPY (EGD) WITH PROPOFOL N/A 05/04/2020   Procedure: ESOPHAGOGASTRODUODENOSCOPY (EGD) WITH PROPOFOL;   Surgeon: Dolores Frame, MD;  [facility];  Service: Gastroenterology;  Laterality: N/A;  . KNEE SURGERY     both    There were no vitals filed for this visit.   Subjective Assessment - 07/10/20 1118    Subjective COVID-19 screen performed prior to patient entering clinic.  2/10 RT side.    Pertinent History DM, Fibromyalgia, Back surgery, CTS, Hypothyroidism, knee surgery.    Currently in Pain? Yes    Pain Score 2     Pain Location Neck    Pain Orientation Right;Left    Pain Descriptors / Indicators Sore    Pain Type Acute pain                             [facility] Adult PT Treatment/Exercise - 07/10/20 0001      Exercises   Exercises Neck;Shoulder      Neck Exercises: Seated   Neck Retraction 10 reps    Cervical Rotation Both;10 reps      Shoulder Exercises: Pulleys   Flexion 3 minutes      Modalities   Modalities Electrical Stimulation;Moist Heat;Ultrasound      Moist Heat Therapy   Number Minutes Moist Heat 15 Minutes    Moist Heat Location Cervical      Electrical Stimulation   Electrical Stimulation Location Bilateral  cervical musculature.    Electrical Stimulation Action IFC x 15 mins 80-150hz     Electrical Stimulation Goals Tone;Pain      Ultrasound   Ultrasound Location RT UT and cerv paras    Ultrasound Parameters 1.5 w/cm2 x 8 mins    Ultrasound Goals Pain      Manual Therapy   Manual Therapy Soft tissue mobilization    Soft tissue mobilization Attempted STW  to RT UT and cerv paras, but to painful                       PT Long Term Goals - 07/05/20 1432      PT LONG TERM GOAL #1   Title Independent with a HEP.    Time 6    Period Weeks    Status New      PT LONG TERM GOAL #2   Title Increase active cervical rotation to 60-65 degrees+ so patient can turn head more easily while driving.    Time 6    Period Weeks    Status New      PT LONG TERM GOAL #3   Title Active right shoulder flexion  to 135 degrees so the patient can easily reach overhead.    Time 6    Period Weeks    Status New      PT LONG TERM GOAL #4   Title Active ER to 70 degrees+ to allow for easily donning/doffing of apparel.    Time 6    Period Weeks    Status New      PT LONG TERM GOAL #5   Title Perform ADL's with pain not > 2-3/10.    Time 6    Period Weeks    Status New                 Plan - 07/10/20 1134    Clinical Impression Statement Pt arrived today doing fairly well with decreased pain. She was able to perform AAROM  therex for RT shldr and did well. Chin tucks and cerv rotation exs instructed to Pt  with verbal and tactile cuing needed. Pt did well with [place] as well as estim with increased cerv rotation end of session.    Comorbidities DM, Fibromyalgia, Back surgery, CTS, Hypothyroidism, knee surgery.    Examination-Activity Limitations Reach Overhead;Other    Examination-Participation Restrictions Other    Stability/Clinical Decision Making Evolving/Moderate complexity    Rehab Potential Good    PT Frequency 2x / week    PT Treatment/Interventions ADLs/Self Care Home Management;Cryotherapy;Electrical Stimulation;Ultrasound;Moist Heat;Therapeutic activities;Therapeutic exercise;Manual techniques;Patient/family education;Passive range of motion;Dry needling;Vasopneumatic Device    PT Next Visit Plan FOTO please.  Modalites and STW/M to patient's neck and right shoulder.  Begin P and AAROM right shoulder range of motion and establish a HEP.  Chin tucks, cervical extension and bilateral active rotation.           Patient will benefit from skilled therapeutic intervention in order to improve the following deficits and impairments:  Pain, Increased muscle spasms, Decreased activity tolerance, Decreased strength, Postural dysfunction, Decreased range of motion  Visit Diagnosis: Acute pain of right shoulder  Cervicalgia  Stiffness of right shoulder, not elsewhere  classified     Problem List Patient Active Problem List   Diagnosis Date Noted  . Right lower quadrant abdominal pain 04/19/2020  . Epigastric pain   . Enterocolitis 02/26/2020  . Diarrhea 08/03/2019  . Epigastric abdominal tenderness 05/30/2019  .  Obesity, Class III, BMI 40-49.9 (morbid obesity) (HCC) 03/31/2019  . Chronic use of benzodiazepine for therapeutic purpose 01/26/2018  . Hiatal hernia 10/29/2017  . Hyperlipidemia associated with type 2 diabetes mellitus (HCC) 12/31/2015  . Coronary artery disease due to lipid rich plaque 12/31/2015  . Left knee pain 10/24/2015  . 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 Essential hypertension 02/20/2015  . Chronic low back pain with sciatica 02/20/2015  . Gastroesophageal reflux disease without esophagitis 11/20/2014  . Anxiety, generalized 11/20/2014  . Hypothyroidism 12/27/2013  . Type 2 diabetes mellitus without complication, without long-term current use of insulin (HCC) 12/27/2013  . Precordial pain 01/15/2013    Terel Bann,CHRIS, PTA 07/10/2020, 12:16 PM  [facility] [address] [place], [place], [address] Phone: [phone]   Fax:  [phone]  Name: Cynthia Mays MRN: [id number] Date of Birth: Jun 29, 1941

## 2020-07-12 ENCOUNTER — Other Ambulatory Visit: Payer: Self-pay

## 2020-07-12 ENCOUNTER — Ambulatory Visit: Payer: Medicare Other | Admitting: Physical Therapy

## 2020-07-12 DIAGNOSIS — M25511 Pain in right shoulder: Secondary | ICD-10-CM

## 2020-07-12 DIAGNOSIS — M25611 Stiffness of right shoulder, not elsewhere classified: Secondary | ICD-10-CM

## 2020-07-12 DIAGNOSIS — M542 Cervicalgia: Secondary | ICD-10-CM

## 2020-07-12 NOTE — Therapy (Signed)
[facility] [address] [place], [place], [address] Phone: [phone]   Fax:  [phone]  Physical Therapy Treatment  Patient Details  Name: CARLO LORSON MRN: [id number] Date of Birth: [birth year] Referring Provider (PT): Darreld Mclean.   Encounter Date: 07/12/2020   PT End of Session - 07/12/20 1142    Visit Number 3    Number of Visits 12    Date for PT Re-Evaluation 08/16/20    PT Start Time 1056    PT Stop Time 1153    PT Time Calculation (min) 57 min    Activity Tolerance Patient tolerated treatment well    Behavior During Therapy WFL for tasks assessed/performed           Past Medical History:  Diagnosis Date  . Complication of anesthesia   . Depression   . Diabetes mellitus   . Fibromyalgia   . GERD (gastroesophageal reflux disease)   . Hiatal hernia   . Hyperlipidemia   . Hypertension   . Hypothyroidism   . Hypothyroidism 07/23/2016  . PONV (postoperative nausea and vomiting)     Past Surgical History:  Procedure Laterality Date  . APPENDECTOMY  2002  . arthroscopy  right 2002, left 2007   bilateral knees  . BACK SURGERY  10/2007, 09/2008   x2  . BIOPSY  10/02/2016   Procedure: BIOPSY;  Surgeon: Malissa Hippo, MD;  [facility];  Service: Endoscopy;;  gastric  . BIOPSY  05/04/2020   Procedure: BIOPSY;  Surgeon: Marguerita Merles, Reuel Boom, MD;  [facility];  Service: Gastroenterology;;  . Fidela Salisbury RELEASE  1986   bilateral  . CESAREAN SECTION  1969, 1967   x2  . COLONOSCOPY WITH PROPOFOL N/A 05/04/2020   Procedure: COLONOSCOPY WITH PROPOFOL;  Surgeon: Dolores Frame, MD;  [facility];  Service: Gastroenterology;  Laterality: N/A;  11  . ESOPHAGOGASTRODUODENOSCOPY N/A 10/02/2016   Procedure: ESOPHAGOGASTRODUODENOSCOPY (EGD);  Surgeon: Malissa Hippo, MD;  [facility];  Service: Endoscopy;  Laterality: N/A;  11:15 - moved to 2/1 @ 3:00  .  ESOPHAGOGASTRODUODENOSCOPY (EGD) WITH PROPOFOL N/A 05/04/2020   Procedure: ESOPHAGOGASTRODUODENOSCOPY (EGD) WITH PROPOFOL;  Surgeon: Dolores Frame, MD;  [facility];  Service: Gastroenterology;  Laterality: N/A;  . KNEE SURGERY     both    There were no vitals filed for this visit.   Subjective Assessment - 07/12/20 1134    Subjective COVID-19 screen performed prior to patient entering clinic.  Lefrt side of neck isn't hurting anymore. Put masking tape on my wall to track how high I can climb the wall with my right shoulder.    Pertinent History DM, Fibromyalgia, Back surgery, CTS, Hypothyroidism, knee surgery.    Currently in Pain? Yes    Pain Score 2     Pain Location Neck    Pain Orientation Right    Pain Descriptors / Indicators Sore    Pain Type Acute pain    Pain Onset 1 to 4 weeks ago                             [facility] Adult PT Treatment/Exercise - 07/12/20 0001      Modalities   Modalities Electrical Stimulation;Moist Heat;Ultrasound      Moist Heat Therapy   Number Minutes Moist Heat 20 Minutes    Moist Heat Location --   RT CERVICAL.  Programme researcher, broadcasting/film/video Location Right cervical region.    Electrical Stimulation Action IFC at 80-150 Hz x 20 minutes on 100% scan    Electrical Stimulation Goals Tone;Pain      Ultrasound   Ultrasound Location RT Cervical musculature/RT UT    Ultrasound Parameters Combo e'stim/US at 1.50 W/CM2 x 12 minutes.    Ultrasound Goals Pain      Manual Therapy   Manual Therapy Soft tissue mobilization    Soft tissue mobilization STW/M x 12 minutes to patient's right cervical musculature/RT UT                       PT Long Term Goals - 07/05/20 1432      PT LONG TERM GOAL #1   Title Independent with a HEP.    Time 6    Period Weeks    Status New      PT LONG TERM GOAL #2   Title Increase active cervical rotation to 60-65 degrees+ so patient can turn  head more easily while driving.    Time 6    Period Weeks    Status New      PT LONG TERM GOAL #3   Title Active right shoulder flexion to 135 degrees so the patient can easily reach overhead.    Time 6    Period Weeks    Status New      PT LONG TERM GOAL #4   Title Active ER to 70 degrees+ to allow for easily donning/doffing of apparel.    Time 6    Period Weeks    Status New      PT LONG TERM GOAL #5   Title Perform ADL's with pain not > 2-3/10.    Time 6    Period Weeks    Status New                 Plan - 07/12/20 1140    Clinical Impression Statement Patient doing very well.  No left sided neck pain reported today.  Right side is much better.  She has been doing the wall climb exercise for her right shoulder.  Added seated right shoulder ER stretch with cane.    Personal Factors and Comorbidities Comorbidity 1;Comorbidity 2    Comorbidities DM, Fibromyalgia, Back surgery, CTS, Hypothyroidism, knee surgery.    Examination-Activity Limitations Reach Overhead;Other    Examination-Participation Restrictions Other    Stability/Clinical Decision Making Evolving/Moderate complexity    Rehab Potential Good    PT Frequency 2x / week    PT Duration 6 weeks    PT Treatment/Interventions ADLs/Self Care Home Management;Cryotherapy;Electrical Stimulation;Ultrasound;Moist Heat;Therapeutic activities;Therapeutic exercise;Manual techniques;Patient/family education;Passive range of motion;Dry needling;Vasopneumatic Device    PT Next Visit Plan FOTO please.  Modalites and STW/M to patient's neck and right shoulder.  Begin P and AAROM right shoulder range of motion and establish a HEP.  Chin tucks, cervical extension and bilateral active rotation.    Consulted and Agree with Plan of Care Patient           Patient will benefit from skilled therapeutic intervention in order to improve the following deficits and impairments:  Pain, Increased muscle spasms, Decreased activity  tolerance, Decreased strength, Postural dysfunction, Decreased range of motion  Visit Diagnosis: Acute pain of right shoulder  Cervicalgia  Stiffness of right shoulder, not elsewhere classified     Problem List Patient Active Problem List   Diagnosis Date Noted  .  Right lower quadrant abdominal pain 04/19/2020  . Epigastric pain   . Enterocolitis 02/26/2020  . Diarrhea 08/03/2019  . Epigastric abdominal tenderness 05/30/2019  . Obesity, Class III, BMI 40-49.9 (morbid obesity) (HCC) 03/31/2019  . Chronic use of benzodiazepine for therapeutic purpose 01/26/2018  . Hiatal hernia 10/29/2017  . Hyperlipidemia associated with type 2 diabetes mellitus (HCC) 12/31/2015  . Coronary artery disease due to lipid rich plaque 12/31/2015  . Left knee pain 10/24/2015  . 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 Essential hypertension 02/20/2015  . Chronic low back pain with sciatica 02/20/2015  . Gastroesophageal reflux disease without esophagitis 11/20/2014  . Anxiety, generalized 11/20/2014  . Hypothyroidism 12/27/2013  . Type 2 diabetes mellitus without complication, without long-term current use of insulin (HCC) 12/27/2013  . Precordial pain 01/15/2013    Landynn Dupler, [place] MPT 07/12/2020, 12:08 PM  [facility] [address] [place], [place], [address] Phone: [phone]   Fax:  [phone]  Name: AN LANNAN MRN: [id number] Date of Birth: 1941/06/12

## 2020-07-16 ENCOUNTER — Other Ambulatory Visit: Payer: Self-pay | Admitting: Family

## 2020-07-16 DIAGNOSIS — Z7689 Persons encountering health services in other specified circumstances: Secondary | ICD-10-CM

## 2020-07-16 DIAGNOSIS — E785 Hyperlipidemia, unspecified: Secondary | ICD-10-CM

## 2020-07-16 DIAGNOSIS — M79645 Pain in left finger(s): Secondary | ICD-10-CM | POA: Diagnosis not present

## 2020-07-16 DIAGNOSIS — M20022 Boutonniere deformity of left finger(s): Secondary | ICD-10-CM | POA: Diagnosis not present

## 2020-07-16 DIAGNOSIS — E1169 Type 2 diabetes mellitus with other specified complication: Secondary | ICD-10-CM

## 2020-07-16 DIAGNOSIS — M65351 Trigger finger, right little finger: Secondary | ICD-10-CM | POA: Diagnosis not present

## 2020-07-17 ENCOUNTER — Encounter: Payer: Self-pay | Admitting: Orthopaedic Surgery

## 2020-07-17 ENCOUNTER — Other Ambulatory Visit: Payer: Self-pay

## 2020-07-17 ENCOUNTER — Ambulatory Visit: Payer: Medicare Other | Admitting: *Deleted

## 2020-07-17 ENCOUNTER — Ambulatory Visit (INDEPENDENT_AMBULATORY_CARE_PROVIDER_SITE_OTHER): Payer: Medicare Other | Admitting: Orthopaedic Surgery

## 2020-07-17 VITALS — BP 172/93 | HR 62 | Ht 59.0 in | Wt 181.0 lb

## 2020-07-17 DIAGNOSIS — M25611 Stiffness of right shoulder, not elsewhere classified: Secondary | ICD-10-CM

## 2020-07-17 DIAGNOSIS — M542 Cervicalgia: Secondary | ICD-10-CM

## 2020-07-17 DIAGNOSIS — M25511 Pain in right shoulder: Secondary | ICD-10-CM

## 2020-07-17 NOTE — Therapy (Signed)
[facility] [address] [place], [place], [address] Phone: [phone]   Fax:  [phone]  Physical Therapy Treatment  Patient Details  Name: Cynthia Mays MRN: [id number] Date of Birth: March 27, 1941 Referring Provider (PT): Darreld Mclean.   Encounter Date: 07/17/2020   PT End of Session - 07/17/20 1353    Visit Number 4    Number of Visits 12    Date for PT Re-Evaluation 08/16/20    PT Start Time 1345    PT Stop Time 1435    PT Time Calculation (min) 50 min           Past Medical History:  Diagnosis Date  . Complication of anesthesia   . Depression   . Diabetes mellitus   . Fibromyalgia   . GERD (gastroesophageal reflux disease)   . Hiatal hernia   . Hyperlipidemia   . Hypertension   . Hypothyroidism   . Hypothyroidism 07/23/2016  . PONV (postoperative nausea and vomiting)     Past Surgical History:  Procedure Laterality Date  . APPENDECTOMY  2002  . arthroscopy  right 2002, left 2007   bilateral knees  . BACK SURGERY  10/2007, 09/2008   x2  . BIOPSY  10/02/2016   Procedure: BIOPSY;  Surgeon: Malissa Hippo, MD;  [facility];  Service: Endoscopy;;  gastric  . BIOPSY  05/04/2020   Procedure: BIOPSY;  Surgeon: Marguerita Merles, Reuel Boom, MD;  [facility];  Service: Gastroenterology;;  . Fidela Salisbury RELEASE  1986   bilateral  . CESAREAN SECTION  1969, 1967   x2  . COLONOSCOPY WITH PROPOFOL N/A 05/04/2020   Procedure: COLONOSCOPY WITH PROPOFOL;  Surgeon: Dolores Frame, MD;  [facility];  Service: Gastroenterology;  Laterality: N/A;  11  . ESOPHAGOGASTRODUODENOSCOPY N/A 10/02/2016   Procedure: ESOPHAGOGASTRODUODENOSCOPY (EGD);  Surgeon: Malissa Hippo, MD;  [facility];  Service: Endoscopy;  Laterality: N/A;  11:15 - moved to 2/1 @ 3:00  . ESOPHAGOGASTRODUODENOSCOPY (EGD) WITH PROPOFOL N/A 05/04/2020   Procedure: ESOPHAGOGASTRODUODENOSCOPY (EGD) WITH PROPOFOL;   Surgeon: Dolores Frame, MD;  [facility];  Service: Gastroenterology;  Laterality: N/A;  . KNEE SURGERY     both    There were no vitals filed for this visit.   Subjective Assessment - 07/17/20 1346    Subjective COVID-19 screen performed prior to patient entering clinic.  Lefrt side of neck isn't hurting anymore. Work on [employer]    Pertinent History DM, Fibromyalgia, Back surgery, CTS, Hypothyroidism, knee surgery.    Currently in Pain? Yes    Pain Score 3     Pain Location Shoulder    Pain Orientation Right    Pain Descriptors / Indicators Sore    Pain Type Acute pain    Pain Onset 1 to 4 weeks ago                             [facility] Adult PT Treatment/Exercise - 07/17/20 0001      Exercises   Exercises Neck;Shoulder      Shoulder Exercises: Pulleys   Flexion 5 minutes    Other Pulley Exercises seated ranger RT UE x 4 mins flexion/ext, CW,CCW      Modalities   Modalities Electrical Stimulation;Moist Heat;Ultrasound      Moist Heat Therapy   Number Minutes Moist Heat 15 Minutes    Moist Heat Location Shoulder  Programme researcher, broadcasting/film/video Location RT Administrator, arts Action IFC x 80-150hz z x 15 mins    Electrical Stimulation Goals Tone;Pain      Manual Therapy   Manual Therapy Passive ROM    Passive ROM PROM /AAROM  to RT shldr for elevation and ER in supine                       PT Long Term Goals - 07/05/20 1432      PT LONG TERM GOAL #1   Title Independent with a HEP.    Time 6    Period Weeks    Status New      PT LONG TERM GOAL #2   Title Increase active cervical rotation to 60-65 degrees+ so patient can turn head more easily while driving.    Time 6    Period Weeks    Status New      PT LONG TERM GOAL #3   Title Active right shoulder flexion to 135 degrees so the patient can easily reach overhead.    Time 6    Period Weeks    Status New      PT LONG  TERM GOAL #4   Title Active ER to 70 degrees+ to allow for easily donning/doffing of apparel.    Time 6    Period Weeks    Status New      PT LONG TERM GOAL #5   Title Perform ADL's with pain not > 2-3/10.    Time 6    Period Weeks    Status New                 Plan - 07/17/20 1351    Clinical Impression Statement Pt arrived today doing well with minimal cervical pain, but with RT shldr pain. She report f/u with MD yesterday and he wants her to continue PT for RT shldr. She did well with AAROM exs  and manual  PROM  for elevation and ER. Normal modality response today    Comorbidities DM, Fibromyalgia, Back surgery, CTS, Hypothyroidism, knee surgery.    Examination-Activity Limitations Reach Overhead;Other    Stability/Clinical Decision Making Evolving/Moderate complexity    PT Treatment/Interventions ADLs/Self Care Home Management;Cryotherapy;Electrical Stimulation;Ultrasound;Moist Heat;Therapeutic activities;Therapeutic exercise;Manual techniques;Patient/family education;Passive range of motion;Dry needling;Vasopneumatic Device    PT Next Visit Plan FOTO please.  Modalites and STW/M to patient's neck and right shoulder.  Begin P and AAROM right shoulder range of motion and establish a HEP.  Chin tucks, cervical extension and bilateral active rotation.    Consulted and Agree with Plan of Care Patient           Patient will benefit from skilled therapeutic intervention in order to improve the following deficits and impairments:  Pain, Increased muscle spasms, Decreased activity tolerance, Decreased strength, Postural dysfunction, Decreased range of motion  Visit Diagnosis: Acute pain of right shoulder  Cervicalgia  Stiffness of right shoulder, not elsewhere classified     Problem List Patient Active Problem List   Diagnosis Date Noted  . Right lower quadrant abdominal pain 04/19/2020  . Epigastric pain   . Enterocolitis 02/26/2020  . Diarrhea 08/03/2019  .  Epigastric abdominal tenderness 05/30/2019  . Obesity, Class III, BMI 40-49.9 (morbid obesity) (HCC) 03/31/2019  . Chronic use of benzodiazepine for therapeutic purpose 01/26/2018  . Hiatal hernia 10/29/2017  . Hyperlipidemia associated with type 2 diabetes mellitus (HCC) 12/31/2015  . Coronary  artery disease due to lipid rich plaque 12/31/2015  . Left knee pain 10/24/2015  . 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 Essential hypertension 02/20/2015  . Chronic low back pain with sciatica 02/20/2015  . Gastroesophageal reflux disease without esophagitis 11/20/2014  . Anxiety, generalized 11/20/2014  . Hypothyroidism 12/27/2013  . Type 2 diabetes mellitus without complication, without long-term current use of insulin (HCC) 12/27/2013  . Precordial pain 01/15/2013    Tequan Redmon,CHRIS, PTA 07/17/2020, 4:27 PM  [facility] [address] [place], [place], [address] Phone: [phone]   Fax:  [phone]  Name: Cynthia Mays MRN: [id number] Date of Birth: 02/24/1941

## 2020-07-17 NOTE — Patient Instructions (Signed)
Continue therapy until you have finished

## 2020-07-17 NOTE — Progress Notes (Signed)
Patient Cynthia Mays:Cynthia Mays Cynthia Mays, female DOB:1941/02/19, 79 y.o. MLY:[id number]  Chief Complaint  Patient presents with  . Neck Pain    HPI  [name] is a 79 y.o. female who has chronic neck pain.  She has been going to PT.  I have reviewed the notes.  She has gotten better.  She still has some pain but it is much less.  She is doing her exercises at home.  She has more PT scheduled.  She has no paresthesias or numbness.   Body mass index is 36.56 kg/m.  ROS  Review of Systems  Constitutional: Positive for activity change.  Musculoskeletal: Positive for arthralgias, neck pain and neck stiffness.  All other systems reviewed and are negative.   All other systems reviewed and are negative.  The following is a summary of the past history medically, past history surgically, known current medicines, social history and family history.  This information is gathered electronically by the computer from prior information and documentation.  I review this each visit and have found including this information at this point in the chart is beneficial and informative.    Past Medical History:  Diagnosis Date  . Complication of anesthesia   . Depression   . Diabetes mellitus   . Fibromyalgia   . GERD (gastroesophageal reflux disease)   . Hiatal hernia   . Hyperlipidemia   . Hypertension   . Hypothyroidism   . Hypothyroidism 07/23/2016  . PONV (postoperative nausea and vomiting)     Past Surgical History:  Procedure Laterality Date  . APPENDECTOMY  2002  . arthroscopy  right 2002, left 2007   bilateral knees  . BACK SURGERY  10/2007, 09/2008   x2  . BIOPSY  10/02/2016   Procedure: BIOPSY;  Surgeon: Malissa Hippo, MD;  [facility];  Service: Endoscopy;;  gastric  . BIOPSY  05/04/2020   Procedure: BIOPSY;  Surgeon: Marguerita Merles, Reuel Boom, MD;  [facility];  Service: Gastroenterology;;  . Fidela Salisbury RELEASE  1986   bilateral  . CESAREAN SECTION   1969, 1967   x2  . COLONOSCOPY WITH PROPOFOL N/A 05/04/2020   Procedure: COLONOSCOPY WITH PROPOFOL;  Surgeon: Dolores Frame, MD;  [facility];  Service: Gastroenterology;  Laterality: N/A;  11  . ESOPHAGOGASTRODUODENOSCOPY N/A 10/02/2016   Procedure: ESOPHAGOGASTRODUODENOSCOPY (EGD);  Surgeon: Malissa Hippo, MD;  [facility];  Service: Endoscopy;  Laterality: N/A;  11:15 - moved to 2/1 @ 3:00  . ESOPHAGOGASTRODUODENOSCOPY (EGD) WITH PROPOFOL N/A 05/04/2020   Procedure: ESOPHAGOGASTRODUODENOSCOPY (EGD) WITH PROPOFOL;  Surgeon: Dolores Frame, MD;  [facility];  Service: Gastroenterology;  Laterality: N/A;  . KNEE SURGERY     both    Family History  Problem Relation Age of Onset  . Anesthesia problems Mother   . CAD Mother 21  . Cancer Mother        Pancreatic cancer  . Hypertension Father   . Suicidality Father        gunshot  . Deafness Sister   . Heart disease Brother   . Suicidality Son   . Cancer Sister        female   . Suicidality Brother   . Hypotension Neg Hx   . Pseudochol deficiency Neg Hx   . Malignant hyperthermia Neg Hx     Social History Social History   Tobacco Use  . Smoking status: Former Smoker    Packs/day: 1.00  Years: 43.00    Pack years: 43.00    Types: Cigarettes    Quit date: 05/02/1970    Years since quitting: 50.2  . Smokeless tobacco: Never Used  . Tobacco comment: > 20 years quit  Vaping Use  . Vaping Use: Never used  Substance Use Topics  . Alcohol use: No    Comment: occasionally wine  . Drug use: No    Allergies  Allergen Reactions  . Codeine Other (See Comments)    Headache, gi upset  . Penicillins Rash    Has patient had a PCN reaction causing immediate rash, facial/tongue/throat swelling, SOB or lightheadedness with hypotension: Yes Has patient had a PCN reaction causing severe rash involving mucus membranes or skin necrosis: No Has patient had a PCN reaction that  required hospitalization No Has patient had a PCN reaction occurring within the last 10 years: No If all of the above answers are "NO", then may proceed with Cephalosporin use.   . Pravastatin     Leg cramps    Current Outpatient Medications  Medication Sig Dispense Refill  . cholecalciferol (VITAMIN D3) 25 MCG (1000 UNIT) tablet Take 1,000 Units by mouth daily.     . clobetasol cream (TEMOVATE) 0.05 % APPLY 1 APPLICATION TOPICALLY ONCE DAILY AS NEEDED FOR  RASH 30 g 11  . diazepam (VALIUM) 5 MG tablet Take 5 mg by mouth every 6 (six) hours as needed for anxiety or muscle spasms.    . famotidine (PEPCID) 40 MG tablet Take 1 tablet (40 mg total) by mouth at bedtime. 90 tablet 1  . fexofenadine (ALLEGRA) 180 MG tablet Take 180 mg by mouth daily.    . fluticasone (FLONASE) 50 MCG/ACT nasal spray Place 2 sprays into the nose daily.     Marland Kitchen HYDROcodone-acetaminophen (NORCO/VICODIN) 5-325 MG tablet Take 1 tablet by mouth every 6 (six) hours as needed for moderate pain. 30 tablet 0  . levothyroxine (EUTHYROX) 112 MCG tablet TAKE 1 TABLET BY MOUTH ONCE DAILY BEFORE BREAKFAST (Patient taking differently: Take 112 mcg by mouth daily before breakfast. ) 90 tablet 3  . lisinopril (ZESTRIL) 10 MG tablet Take 0.5 tablets (5 mg total) by mouth daily. (Patient not taking: Reported on 07/03/2020) 90 tablet 1  . mesalamine (LIALDA) 1.2 g EC tablet Take 2 tablets (2.4 g total) by mouth daily with breakfast. 60 tablet 2  . metFORMIN (GLUCOPHAGE) 500 MG tablet Take 1 tablet (500 mg total) by mouth 2 (two) times daily. 180 tablet 1  . Multiple Vitamins-Minerals (MULTIVITAMINS THER. W/MINERALS) TABS Take 1 tablet by mouth daily.      . ondansetron (ZOFRAN ODT) 8 MG disintegrating tablet Take 1 tablet (8 mg total) by mouth every 8 (eight) hours as needed for nausea or vomiting. 20 tablet 0  . pantoprazole (PROTONIX) 40 MG tablet Take 40 mg by mouth 2 (two) times daily.    Marland Kitchen PLENVU 140 g SOLR TAKE 1 MOUTH FOR ONE DOSE 3  each 0  . Probiotic Product (PHILLIPS COLON HEALTH) CAPS Take 1 capsule by mouth daily.     . rosuvastatin (CRESTOR) 5 MG tablet Take 1 tablet by mouth once daily 90 tablet 0  . sertraline (ZOLOFT) 50 MG tablet TAKE 1 & 1/2 (ONE & ONE-HALF) TABLETS BY MOUTH ONCE DAILY 135 tablet 0   No current facility-administered medications for this visit.     Physical Exam  Blood pressure (!) 172/93, pulse 62, height 4\' 11"  (1.499 m), weight 181 lb (82.1 kg).  Constitutional: overall normal hygiene, normal nutrition, well developed, normal grooming, normal body habitus. Assistive device:none  Musculoskeletal: gait and station Limp none, muscle tone and strength are normal, no tremors or atrophy is present.  .  Neurological: coordination overall normal.  Deep tendon reflex/nerve stretch intact.  Sensation normal.  Cranial nerves II-XII intact.   Skin:   Normal overall no scars, lesions, ulcers or rashes. No psoriasis.  Psychiatric: Alert and oriented x 3.  Recent memory intact, remote memory unclear.  Normal mood and affect. Well groomed.  Good eye contact.  Cardiovascular: overall no swelling, no varicosities, no edema bilaterally, normal temperatures of the legs and arms, no clubbing, cyanosis and good capillary refill.  Lymphatic: palpation is normal.  Neck has full ROM.  NV intact.  All other systems reviewed and are negative   The patient has been educated about the nature of the problem(s) and counseled on treatment options.  The patient appeared to understand what I have discussed and is in agreement with it.  Encounter Diagnosis  Name Primary?  . Neck pain Yes    PLAN Call if any problems.  Precautions discussed.  Continue current medications.   Return to clinic 1 month   Electronically Signed Darreld Mclean, MD 11/16/202111:03 AM

## 2020-07-19 ENCOUNTER — Ambulatory Visit: Payer: Medicare Other | Admitting: *Deleted

## 2020-07-19 ENCOUNTER — Other Ambulatory Visit: Payer: Self-pay

## 2020-07-19 ENCOUNTER — Encounter: Payer: Self-pay | Admitting: *Deleted

## 2020-07-19 DIAGNOSIS — M25611 Stiffness of right shoulder, not elsewhere classified: Secondary | ICD-10-CM

## 2020-07-19 DIAGNOSIS — M542 Cervicalgia: Secondary | ICD-10-CM | POA: Diagnosis not present

## 2020-07-19 DIAGNOSIS — M25511 Pain in right shoulder: Secondary | ICD-10-CM | POA: Diagnosis not present

## 2020-07-19 NOTE — Therapy (Signed)
[facility] [address] [place], [place], [address] Phone: [phone]   Fax:  [phone]  Physical Therapy Treatment  Patient Details  Name: Cynthia Mays MRN: [id number] Date of Birth: [birth year] Referring Provider (PT): Darreld Mclean.   Encounter Date: 07/19/2020   PT End of Session - 07/19/20 1121    Visit Number 5    Number of Visits 12    Date for PT Re-Evaluation 08/16/20    Authorization Type FOTO    PT Start Time 1115    PT Stop Time 1205    PT Time Calculation (min) 50 min    Activity Tolerance Patient tolerated treatment well           Past Medical History:  Diagnosis Date  . Complication of anesthesia   . Depression   . Diabetes mellitus   . Fibromyalgia   . GERD (gastroesophageal reflux disease)   . Hiatal hernia   . Hyperlipidemia   . Hypertension   . Hypothyroidism   . Hypothyroidism 07/23/2016  . PONV (postoperative nausea and vomiting)     Past Surgical History:  Procedure Laterality Date  . APPENDECTOMY  2002  . arthroscopy  right 2002, left 2007   bilateral knees  . BACK SURGERY  10/2007, 09/2008   x2  . BIOPSY  10/02/2016   Procedure: BIOPSY;  Surgeon: Malissa Hippo, MD;  [facility];  Service: Endoscopy;;  gastric  . BIOPSY  05/04/2020   Procedure: BIOPSY;  Surgeon: Marguerita Merles, Reuel Boom, MD;  [facility];  Service: Gastroenterology;;  . Fidela Salisbury RELEASE  1986   bilateral  . CESAREAN SECTION  1969, 1967   x2  . COLONOSCOPY WITH PROPOFOL N/A 05/04/2020   Procedure: COLONOSCOPY WITH PROPOFOL;  Surgeon: Dolores Frame, MD;  [facility];  Service: Gastroenterology;  Laterality: N/A;  11  . ESOPHAGOGASTRODUODENOSCOPY N/A 10/02/2016   Procedure: ESOPHAGOGASTRODUODENOSCOPY (EGD);  Surgeon: Malissa Hippo, MD;  [facility];  Service: Endoscopy;  Laterality: N/A;  11:15 - moved to 2/1 @ 3:00  . ESOPHAGOGASTRODUODENOSCOPY (EGD) WITH  PROPOFOL N/A 05/04/2020   Procedure: ESOPHAGOGASTRODUODENOSCOPY (EGD) WITH PROPOFOL;  Surgeon: Dolores Frame, MD;  [facility];  Service: Gastroenterology;  Laterality: N/A;  . KNEE SURGERY     both    There were no vitals filed for this visit.   Subjective Assessment - 07/19/20 1119    Subjective COVID-19 screen performed prior to patient entering clinic.  Lefrt side of neck isn't hurting anymore.  Did ok after last Rx    Pertinent History DM, Fibromyalgia, Back surgery, CTS, Hypothyroidism, knee surgery.    Currently in Pain? Yes    Pain Score 2     Pain Location Shoulder    Pain Orientation Right    Pain Descriptors / Indicators Sore    Pain Type Acute pain                             [facility] Adult PT Treatment/Exercise - 07/19/20 0001      Exercises   Exercises Neck;Shoulder      Shoulder Exercises: Pulleys   Flexion 5 minutes    Other Pulley Exercises seated ranger RT UE x 5 mins flexion/ext, CW,CCW      Modalities   Modalities Electrical Stimulation;Moist Heat;Ultrasound      Moist Heat Therapy   Number Minutes Moist Heat 15 Minutes  Moist Heat Location Shoulder      Electrical Stimulation   Electrical Stimulation Location RT shldr    Electrical Stimulation Action IFCx 80-150hz  x 15 mins    Electrical Stimulation Goals Tone;Pain      Manual Therapy   Manual Therapy Passive ROM    Soft tissue mobilization STW/ TPR to patient's RT UT with good release    Passive ROM PROM /AAROM  to RT shldr for elevation and ER in supine                       PT Long Term Goals - 07/05/20 1432      PT LONG TERM GOAL #1   Title Independent with a HEP.    Time 6    Period Weeks    Status New      PT LONG TERM GOAL #2   Title Increase active cervical rotation to 60-65 degrees+ so patient can turn head more easily while driving.    Time 6    Period Weeks    Status New      PT LONG TERM GOAL #3   Title Active right  shoulder flexion to 135 degrees so the patient can easily reach overhead.    Time 6    Period Weeks    Status New      PT LONG TERM GOAL #4   Title Active ER to 70 degrees+ to allow for easily donning/doffing of apparel.    Time 6    Period Weeks    Status New      PT LONG TERM GOAL #5   Title Perform ADL's with pain not > 2-3/10.    Time 6    Period Weeks    Status New                 Plan - 07/19/20 1201    Clinical Impression Statement Pt arrived today reporting that she did some yard work at home and probably over did it yesterday. Rx focused on RT shldr AAROM and PROM as well as STW on RT UT with good release. PROM 125 degrees flexion with pain at end-range, ER 50 degrees. P    Comorbidities DM, Fibromyalgia, Back surgery, CTS, Hypothyroidism, knee surgery.    Examination-Activity Limitations Reach Overhead;Other    Examination-Participation Restrictions Other    Stability/Clinical Decision Making Evolving/Moderate complexity    Rehab Potential Good    PT Frequency 2x / week    PT Treatment/Interventions ADLs/Self Care Home Management;Cryotherapy;Electrical Stimulation;Ultrasound;Moist Heat;Therapeutic activities;Therapeutic exercise;Manual techniques;Patient/family education;Passive range of motion;Dry needling;Vasopneumatic Device    PT Next Visit Plan Modalites and STW/M to patient's neck and right shoulder.  Begin P and AAROM right shoulder range of motion and establish a HEP.  Chin tucks, cervical extension and bilateral active rotation.           Patient will benefit from skilled therapeutic intervention in order to improve the following deficits and impairments:  Pain, Increased muscle spasms, Decreased activity tolerance, Decreased strength, Postural dysfunction, Decreased range of motion  Visit Diagnosis: Acute pain of right shoulder  Cervicalgia  Stiffness of right shoulder, not elsewhere classified     Problem List Patient Active Problem List    Diagnosis Date Noted  . Right lower quadrant abdominal pain 04/19/2020  . Epigastric pain   . Enterocolitis 02/26/2020  . Diarrhea 08/03/2019  . Epigastric abdominal tenderness 05/30/2019  . Obesity, Class III, BMI 40-49.9 (morbid obesity) (HCC) 03/31/2019  .  Chronic use of benzodiazepine for therapeutic purpose 01/26/2018  . Hiatal hernia 10/29/2017  . Hyperlipidemia associated with type 2 diabetes mellitus (HCC) 12/31/2015  . Coronary artery disease due to lipid rich plaque 12/31/2015  . Left knee pain 10/24/2015  . 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 Essential hypertension 02/20/2015  . Chronic low back pain with sciatica 02/20/2015  . Gastroesophageal reflux disease without esophagitis 11/20/2014  . Anxiety, generalized 11/20/2014  . Hypothyroidism 12/27/2013  . Type 2 diabetes mellitus without complication, without long-term current use of insulin (HCC) 12/27/2013  . Precordial pain 01/15/2013    Atley Neubert,CHRIS, PTA 07/19/2020, 1:37 PM  [facility] [address] [place], [place], [address] Phone: [phone]   Fax:  [phone]  Name: AZRAEL HUSS MRN: [id number] Date of Birth: 1940/12/20

## 2020-07-23 ENCOUNTER — Ambulatory Visit: Payer: Medicare Other | Admitting: *Deleted

## 2020-07-23 ENCOUNTER — Other Ambulatory Visit: Payer: Self-pay

## 2020-07-23 DIAGNOSIS — M25511 Pain in right shoulder: Secondary | ICD-10-CM

## 2020-07-23 DIAGNOSIS — M25611 Stiffness of right shoulder, not elsewhere classified: Secondary | ICD-10-CM | POA: Diagnosis not present

## 2020-07-23 DIAGNOSIS — M542 Cervicalgia: Secondary | ICD-10-CM

## 2020-07-23 NOTE — Therapy (Signed)
[facility] [address] [place], [place], [address] Phone: [phone]   Fax:  [phone]  Physical Therapy Treatment  Patient Details  Name: Cynthia Mays MRN: [id number] Date of Birth: 1941/05/27 Referring Provider (PT): Darreld Mclean.   Encounter Date: 07/23/2020   PT End of Session - 07/23/20 1125    Visit Number 6    Number of Visits 12    Date for PT Re-Evaluation 08/16/20    Authorization Type FOTO    PT Start Time 1115    PT Stop Time 1205    PT Time Calculation (min) 50 min           Past Medical History:  Diagnosis Date  . Complication of anesthesia   . Depression   . Diabetes mellitus   . Fibromyalgia   . GERD (gastroesophageal reflux disease)   . Hiatal hernia   . Hyperlipidemia   . Hypertension   . Hypothyroidism   . Hypothyroidism 07/23/2016  . PONV (postoperative nausea and vomiting)     Past Surgical History:  Procedure Laterality Date  . APPENDECTOMY  2002  . arthroscopy  right 2002, left 2007   bilateral knees  . BACK SURGERY  10/2007, 09/2008   x2  . BIOPSY  10/02/2016   Procedure: BIOPSY;  Surgeon: Malissa Hippo, MD;  [facility];  Service: Endoscopy;;  gastric  . BIOPSY  05/04/2020   Procedure: BIOPSY;  Surgeon: Marguerita Merles, Reuel Boom, MD;  [facility];  Service: Gastroenterology;;  . Fidela Salisbury RELEASE  1986   bilateral  . CESAREAN SECTION  1969, 1967   x2  . COLONOSCOPY WITH PROPOFOL N/A 05/04/2020   Procedure: COLONOSCOPY WITH PROPOFOL;  Surgeon: Dolores Frame, MD;  [facility];  Service: Gastroenterology;  Laterality: N/A;  11  . ESOPHAGOGASTRODUODENOSCOPY N/A 10/02/2016   Procedure: ESOPHAGOGASTRODUODENOSCOPY (EGD);  Surgeon: Malissa Hippo, MD;  [facility];  Service: Endoscopy;  Laterality: N/A;  11:15 - moved to 2/1 @ 3:00  . ESOPHAGOGASTRODUODENOSCOPY (EGD) WITH PROPOFOL N/A 05/04/2020   Procedure:  ESOPHAGOGASTRODUODENOSCOPY (EGD) WITH PROPOFOL;  Surgeon: Dolores Frame, MD;  [facility];  Service: Gastroenterology;  Laterality: N/A;  . KNEE SURGERY     both    There were no vitals filed for this visit.   Subjective Assessment - 07/23/20 1121    Subjective COVID-19 screen performed prior to patient entering clinic.  RT shldr hurting more today 5-6/10    Pertinent History DM, Fibromyalgia, Back surgery, CTS, Hypothyroidism, knee surgery.    Currently in Pain? Yes    Pain Score 4     Pain Location Shoulder    Pain Orientation Right    Pain Descriptors / Indicators Sore;Aching    Pain Type Acute pain    Pain Onset 1 to 4 weeks ago                             [facility] Adult PT Treatment/Exercise - 07/23/20 0001      Exercises   Exercises Neck;Shoulder      Shoulder Exercises: Pulleys   Flexion 5 minutes    Other Pulley Exercises seated ranger RT UE x 5 mins flexion/ext, CW,CCW      Modalities   Modalities Electrical Stimulation;Moist Heat;Ultrasound      Moist Heat Therapy   Number Minutes Moist Heat 15 Minutes    Moist Heat Location Shoulder  Emergency planning/management officer RT Chartered certified accountant IFC x15 mins 80-150hz     Electrical Stimulation Goals Tone;Pain      Manual Therapy   Manual Therapy Passive ROM    Manual therapy comments flexion to 140 degrees and  ER to 50 degrees end of RX    Soft tissue mobilization x-friction massage to RT ACJ    Passive ROM PROM /AAROM  to RT shldr for elevation and ER in supine          Instructed in home pulley use.             PT Long Term Goals - 07/05/20 1432      PT LONG TERM GOAL #1   Title Independent with a HEP.    Time 6    Period Weeks    Status New      PT LONG TERM GOAL #2   Title Increase active cervical rotation to 60-65 degrees+ so patient can turn head more easily while driving.    Time 6    Period Weeks     Status New      PT LONG TERM GOAL #3   Title Active right shoulder flexion to 135 degrees so the patient can easily reach overhead.    Time 6    Period Weeks    Status New      PT LONG TERM GOAL #4   Title Active ER to 70 degrees+ to allow for easily donning/doffing of apparel.    Time 6    Period Weeks    Status New      PT LONG TERM GOAL #5   Title Perform ADL's with pain not > 2-3/10.    Time 6    Period Weeks    Status New                 Plan - 07/23/20 1204    Clinical Impression Statement Pt arrived today doing ok with minimal neck pain, but reports RT shldr pain/soreness. Pt was instructed in home pully use for ROM RT shldr with independent use. She was able to perform AAROM exs f/b PROM and STW to RT ACJ and did well. PROM RT shldr flexion to 140 degrees and ER to 50 degrees with pain at end-ranges.    Personal Factors and Comorbidities Comorbidity 1;Comorbidity 2    Comorbidities DM, Fibromyalgia, Back surgery, CTS, Hypothyroidism, knee surgery.    Examination-Activity Limitations Reach Overhead;Other    Examination-Participation Restrictions Other    Stability/Clinical Decision Making Evolving/Moderate complexity    Rehab Potential Good    PT Frequency 2x / week    PT Duration 6 weeks    PT Treatment/Interventions ADLs/Self Care Home Management;Cryotherapy;Electrical Stimulation;Ultrasound;Moist Heat;Therapeutic activities;Therapeutic exercise;Manual techniques;Patient/family education;Passive range of motion;Dry needling;Vasopneumatic Device    PT Next Visit Plan Modalites and STW/M to patient's neck and right shoulder.  Begin P and AAROM right shoulder range of motion and establish a HEP.  Chin tucks, cervical extension and bilateral active rotation.           Patient will benefit from skilled therapeutic intervention in order to improve the following deficits and impairments:  Pain, Increased muscle spasms, Decreased activity tolerance, Decreased  strength, Postural dysfunction, Decreased range of motion  Visit Diagnosis: Acute pain of right shoulder  Cervicalgia  Stiffness of right shoulder, not elsewhere classified     Problem List Patient Active Problem List   Diagnosis Date Noted  .  Right lower quadrant abdominal pain 04/19/2020  . Epigastric pain   . Enterocolitis 02/26/2020  . Diarrhea 08/03/2019  . Epigastric abdominal tenderness 05/30/2019  . Obesity, Class III, BMI 40-49.9 (morbid obesity) (HCC) 03/31/2019  . Chronic use of benzodiazepine for therapeutic purpose 01/26/2018  . Hiatal hernia 10/29/2017  . Hyperlipidemia associated with type 2 diabetes mellitus (HCC) 12/31/2015  . Coronary artery disease due to lipid rich plaque 12/31/2015  . Left knee pain 10/24/2015  . 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 Essential hypertension 02/20/2015  . Chronic low back pain with sciatica 02/20/2015  . Gastroesophageal reflux disease without esophagitis 11/20/2014  . Anxiety, generalized 11/20/2014  . Hypothyroidism 12/27/2013  . Type 2 diabetes mellitus without complication, without long-term current use of insulin (HCC) 12/27/2013  . Precordial pain 01/15/2013    Hazim Treadway,CHRIS, PTA 07/23/2020, 12:09 PM  [facility] [address] [place], [place], [address] Phone: [phone]   Fax:  [phone]  Name: TEMPERANCE KELEMEN MRN: [id number] Date of Birth: 1941/01/04

## 2020-07-31 ENCOUNTER — Ambulatory Visit: Payer: Medicare Other | Admitting: *Deleted

## 2020-07-31 ENCOUNTER — Other Ambulatory Visit: Payer: Self-pay

## 2020-07-31 DIAGNOSIS — M25611 Stiffness of right shoulder, not elsewhere classified: Secondary | ICD-10-CM

## 2020-07-31 DIAGNOSIS — M542 Cervicalgia: Secondary | ICD-10-CM | POA: Diagnosis not present

## 2020-07-31 DIAGNOSIS — M25511 Pain in right shoulder: Secondary | ICD-10-CM

## 2020-07-31 NOTE — Therapy (Signed)
[facility] [address] [place], [place], [address] Phone: [phone]   Fax:  [phone]  Physical Therapy Treatment  Patient Details  Name: Cynthia Mays MRN: [id number] Date of Birth: [birth year] Referring Provider (PT): Darreld Mclean.   Encounter Date: 07/31/2020   PT End of Session - 07/31/20 1120    Visit Number 7    Number of Visits 12    Date for PT Re-Evaluation 08/16/20    Authorization Type FOTO    PT Start Time 1115    PT Stop Time 1205    PT Time Calculation (min) 50 min    Activity Tolerance Patient tolerated treatment well           Past Medical History:  Diagnosis Date  . Complication of anesthesia   . Depression   . Diabetes mellitus   . Fibromyalgia   . GERD (gastroesophageal reflux disease)   . Hiatal hernia   . Hyperlipidemia   . Hypertension   . Hypothyroidism   . Hypothyroidism 07/23/2016  . PONV (postoperative nausea and vomiting)     Past Surgical History:  Procedure Laterality Date  . APPENDECTOMY  2002  . arthroscopy  right 2002, left 2007   bilateral knees  . BACK SURGERY  10/2007, 09/2008   x2  . BIOPSY  10/02/2016   Procedure: BIOPSY;  Surgeon: Malissa Hippo, MD;  [facility];  Service: Endoscopy;;  gastric  . BIOPSY  05/04/2020   Procedure: BIOPSY;  Surgeon: Marguerita Merles, Reuel Boom, MD;  [facility];  Service: Gastroenterology;;  . Fidela Salisbury RELEASE  1986   bilateral  . CESAREAN SECTION  1969, 1967   x2  . COLONOSCOPY WITH PROPOFOL N/A 05/04/2020   Procedure: COLONOSCOPY WITH PROPOFOL;  Surgeon: Dolores Frame, MD;  [facility];  Service: Gastroenterology;  Laterality: N/A;  11  . ESOPHAGOGASTRODUODENOSCOPY N/A 10/02/2016   Procedure: ESOPHAGOGASTRODUODENOSCOPY (EGD);  Surgeon: Malissa Hippo, MD;  [facility];  Service: Endoscopy;  Laterality: N/A;  11:15 - moved to 2/1 @ 3:00  . ESOPHAGOGASTRODUODENOSCOPY (EGD) WITH  PROPOFOL N/A 05/04/2020   Procedure: ESOPHAGOGASTRODUODENOSCOPY (EGD) WITH PROPOFOL;  Surgeon: Dolores Frame, MD;  [facility];  Service: Gastroenterology;  Laterality: N/A;  . KNEE SURGERY     both    There were no vitals filed for this visit.   Subjective Assessment - 07/31/20 1121    Subjective COVID-19 screen performed prior to patient entering clinic.  RT shldr hurting more today 3-6/10. Getting better    Pertinent History DM, Fibromyalgia, Back surgery, CTS, Hypothyroidism, knee surgery.    Currently in Pain? Yes    Pain Score 3     Pain Location Shoulder    Pain Orientation Right                             [facility] Adult PT Treatment/Exercise - 07/31/20 0001      Exercises   Exercises Neck;Shoulder      Shoulder Exercises: Pulleys   Flexion 5 minutes    Other Pulley Exercises seated ranger RT UE x 5 mins flexion/ext, CW,CCW      Modalities   Modalities Electrical Stimulation;Moist Heat;Ultrasound      Moist Heat Therapy   Number Minutes Moist Heat 15 Minutes    Moist Heat Location Shoulder      Electrical Stimulation   Electrical Stimulation Location RT shldr  Electrical Stimulation Action premod 80-150hz  x 15 mins    Electrical Stimulation Goals Tone;Pain      Manual Therapy   Manual Therapy Passive ROM    Manual therapy comments IR most limited/painful    Soft tissue mobilization x-friction massage to RT ACJ    Passive ROM PROM /AAROM  to RT shldr for elevation, IR and ER in supine                       PT Long Term Goals - 07/05/20 1432      PT LONG TERM GOAL #1   Title Independent with a HEP.    Time 6    Period Weeks    Status New      PT LONG TERM GOAL #2   Title Increase active cervical rotation to 60-65 degrees+ so patient can turn head more easily while driving.    Time 6    Period Weeks    Status New      PT LONG TERM GOAL #3   Title Active right shoulder flexion to 135 degrees so the  patient can easily reach overhead.    Time 6    Period Weeks    Status New      PT LONG TERM GOAL #4   Title Active ER to 70 degrees+ to allow for easily donning/doffing of apparel.    Time 6    Period Weeks    Status New      PT LONG TERM GOAL #5   Title Perform ADL's with pain not > 2-3/10.    Time 6    Period Weeks    Status New                 Plan - 07/31/20 1159    Clinical Impression Statement Pt arrived today doing fair and reports that her neck is still doing good and shldr is getting better, but still hurts on top when raising it. Rx focused on AROM as well as PROM for RT shldr. She did well with Rx and STW  to ACJhelps with decreased pain during elevation. Pt c/o the most pain today with IR motion.    Comorbidities DM, Fibromyalgia, Back surgery, CTS, Hypothyroidism, knee surgery.    Examination-Activity Limitations Reach Overhead;Other    PT Frequency 2x / week    PT Duration 6 weeks    PT Treatment/Interventions ADLs/Self Care Home Management;Cryotherapy;Electrical Stimulation;Ultrasound;Moist Heat;Therapeutic activities;Therapeutic exercise;Manual techniques;Patient/family education;Passive range of motion;Dry needling;Vasopneumatic Device    PT Next Visit Plan Modalites and STW/M to patient's neck and right shoulder.  Begin P and AAROM right shoulder range of motion and establish a HEP.  Chin tucks, cervical extension and bilateral active rotation.    Consulted and Agree with Plan of Care Patient           Patient will benefit from skilled therapeutic intervention in order to improve the following deficits and impairments:  Pain, Increased muscle spasms, Decreased activity tolerance, Decreased strength, Postural dysfunction, Decreased range of motion  Visit Diagnosis: Acute pain of right shoulder  Cervicalgia  Stiffness of right shoulder, not elsewhere classified     Problem List Patient Active Problem List   Diagnosis Date Noted  . Right lower  quadrant abdominal pain 04/19/2020  . Epigastric pain   . Enterocolitis 02/26/2020  . Diarrhea 08/03/2019  . Epigastric abdominal tenderness 05/30/2019  . Obesity, Class III, BMI 40-49.9 (morbid obesity) (HCC) 03/31/2019  . Chronic use of  benzodiazepine for therapeutic purpose 01/26/2018  . Hiatal hernia 10/29/2017  . Hyperlipidemia associated with type 2 diabetes mellitus (HCC) 12/31/2015  . Coronary artery disease due to lipid rich plaque 12/31/2015  . Left knee pain 10/24/2015  . 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 Essential hypertension 02/20/2015  . Chronic low back pain with sciatica 02/20/2015  . Gastroesophageal reflux disease without esophagitis 11/20/2014  . Anxiety, generalized 11/20/2014  . Hypothyroidism 12/27/2013  . Type 2 diabetes mellitus without complication, without long-term current use of insulin (HCC) 12/27/2013  . Precordial pain 01/15/2013    Tyric Rodeheaver,CHRIS, PTA 07/31/2020, 12:05 PM  [facility] [address] [place], [place], [address] Phone: [phone]   Fax:  [phone]  Name: Cynthia Mays MRN: [id number] Date of Birth: [birth year]

## 2020-08-01 ENCOUNTER — Other Ambulatory Visit (INDEPENDENT_AMBULATORY_CARE_PROVIDER_SITE_OTHER): Payer: Self-pay | Admitting: Internal Medicine

## 2020-08-01 ENCOUNTER — Other Ambulatory Visit: Payer: Self-pay | Admitting: Family

## 2020-08-01 DIAGNOSIS — K2101 Gastro-esophageal reflux disease with esophagitis, with bleeding: Secondary | ICD-10-CM

## 2020-08-01 DIAGNOSIS — R10826 Epigastric rebound abdominal tenderness: Secondary | ICD-10-CM

## 2020-08-02 ENCOUNTER — Encounter: Payer: Medicare Other | Admitting: *Deleted

## 2020-08-02 ENCOUNTER — Ambulatory Visit (INDEPENDENT_AMBULATORY_CARE_PROVIDER_SITE_OTHER): Payer: Medicare Other | Admitting: Gastroenterology

## 2020-08-06 ENCOUNTER — Other Ambulatory Visit: Payer: Self-pay

## 2020-08-06 ENCOUNTER — Encounter: Payer: Self-pay | Admitting: Physical Therapy

## 2020-08-06 ENCOUNTER — Ambulatory Visit: Payer: Medicare Other | Attending: Orthopaedic Surgery | Admitting: Physical Therapy

## 2020-08-06 DIAGNOSIS — E039 Hypothyroidism, unspecified: Secondary | ICD-10-CM

## 2020-08-06 DIAGNOSIS — M25611 Stiffness of right shoulder, not elsewhere classified: Secondary | ICD-10-CM | POA: Insufficient documentation

## 2020-08-06 DIAGNOSIS — M25511 Pain in right shoulder: Secondary | ICD-10-CM

## 2020-08-06 DIAGNOSIS — M542 Cervicalgia: Secondary | ICD-10-CM | POA: Insufficient documentation

## 2020-08-06 MED ORDER — METFORMIN HCL 500 MG PO TABS: 500.0000 mg | ORAL_TABLET | Freq: Two times a day (BID) | ORAL | 0 refills | Status: DC

## 2020-08-06 MED ORDER — LEVOTHYROXINE SODIUM 112 MCG PO TABS: ORAL_TABLET | ORAL | 0 refills | Status: DC

## 2020-08-06 NOTE — Therapy (Signed)
[facility] [address] [place], [place], [address] Phone: [phone]   Fax:  [phone]  Physical Therapy Treatment  Patient Details  Name: Cynthia Mays MRN: [id number] Date of Birth: [birth year] Referring Provider (PT): Darreld Mclean.   Encounter Date: 08/06/2020   PT End of Session - 08/06/20 1043    Visit Number 8    Number of Visits 12    Date for PT Re-Evaluation 08/16/20    Authorization Type FOTO    PT Start Time 1030    PT Stop Time 1115    PT Time Calculation (min) 45 min    Activity Tolerance Patient tolerated treatment well    Behavior During Therapy WFL for tasks assessed/performed           Past Medical History:  Diagnosis Date  . Complication of anesthesia   . Depression   . Diabetes mellitus   . Fibromyalgia   . GERD (gastroesophageal reflux disease)   . Hiatal hernia   . Hyperlipidemia   . Hypertension   . Hypothyroidism   . Hypothyroidism 07/23/2016  . PONV (postoperative nausea and vomiting)     Past Surgical History:  Procedure Laterality Date  . APPENDECTOMY  2002  . arthroscopy  right 2002, left 2007   bilateral knees  . BACK SURGERY  10/2007, 09/2008   x2  . BIOPSY  10/02/2016   Procedure: BIOPSY;  Surgeon: Malissa Hippo, MD;  [facility];  Service: Endoscopy;;  gastric  . BIOPSY  05/04/2020   Procedure: BIOPSY;  Surgeon: Marguerita Merles, Reuel Boom, MD;  [facility];  Service: Gastroenterology;;  . Fidela [place] RELEASE  1986   bilateral  . CESAREAN SECTION  1969, 1967   x2  . COLONOSCOPY WITH PROPOFOL N/A 05/04/2020   Procedure: COLONOSCOPY WITH PROPOFOL;  Surgeon: Dolores Frame, MD;  [facility];  Service: Gastroenterology;  Laterality: N/A;  11  . ESOPHAGOGASTRODUODENOSCOPY N/A 10/02/2016   Procedure: ESOPHAGOGASTRODUODENOSCOPY (EGD);  Surgeon: Malissa Hippo, MD;  [facility];  Service: Endoscopy;  Laterality: N/A;  11:15 -  moved to 2/1 @ 3:00  . ESOPHAGOGASTRODUODENOSCOPY (EGD) WITH PROPOFOL N/A 05/04/2020   Procedure: ESOPHAGOGASTRODUODENOSCOPY (EGD) WITH PROPOFOL;  Surgeon: Dolores Frame, MD;  [facility];  Service: Gastroenterology;  Laterality: N/A;  . KNEE SURGERY     both    There were no vitals filed for this visit.   Subjective Assessment - 08/06/20 1036    Subjective Covid-19 screen performed upon arrival. Pt reporting R shoulder pain and stiffness of 8-9/10. Pt stating she was cutting limbs with extended garden sheers and it aggrivated her shoulder. Pt stated, "I'm never doing that again".    Pertinent History DM, Fibromyalgia, Back surgery, CTS, Hypothyroidism, knee surgery.    Currently in Pain? Yes    Pain Score 9     Pain Location Shoulder    Pain Orientation Right    Pain Descriptors / Indicators Burning;Aching;Sore    Pain Type Acute pain    Pain Onset 1 to 4 weeks ago    Pain Frequency Constant                             [facility] Adult PT Treatment/Exercise - 08/06/20 0001      Exercises   Exercises Neck;Shoulder      Shoulder Exercises: Pulleys   Flexion 5 minutes    Other  Pulley Exercises seated ranger RT UE x 5 mins flexion/ext, CW,CCW      Modalities   Modalities Electrical Stimulation;Moist Heat;Ultrasound      Moist Heat Therapy   Number Minutes Moist Heat 15 Minutes    Moist Heat Location Shoulder      Electrical Stimulation   Electrical Stimulation Location R shoulder    Electrical Stimulation Action Pre-mod 80-150 Hz x 15 minutes    Electrical Stimulation Goals Tone;Pain      Manual Therapy   Manual Therapy Passive ROM    Manual therapy comments IR most limited/painful    Passive ROM PROM /AAROM  to RT shldr for elevation, IR and ER in supine                       PT Long Term Goals - 07/05/20 1432      PT LONG TERM GOAL #1   Title Independent with a HEP.    Time 6    Period Weeks    Status New       PT LONG TERM GOAL #2   Title Increase active cervical rotation to 60-65 degrees+ so patient can turn head more easily while driving.    Time 6    Period Weeks    Status New      PT LONG TERM GOAL #3   Title Active right shoulder flexion to 135 degrees so the patient can easily reach overhead.    Time 6    Period Weeks    Status New      PT LONG TERM GOAL #4   Title Active ER to 70 degrees+ to allow for easily donning/doffing of apparel.    Time 6    Period Weeks    Status New      PT LONG TERM GOAL #5   Title Perform ADL's with pain not > 2-3/10.    Time 6    Period Weeks    Status New                 Plan - 08/06/20 1046    Clinical Impression Statement Pt arriving in more pain today after doing yard work over the weekend lifting garden sheers over head to [employer]. Pt tolerated exercises limited by pain in R shoulder. Pt reporting mild relief following PROM and E-stim and heat. Continue skilled PT.    Personal Factors and Comorbidities Comorbidity 1;Comorbidity 2    Comorbidities DM, Fibromyalgia, Back surgery, CTS, Hypothyroidism, knee surgery.    Examination-Activity Limitations Reach Overhead;Other    Examination-Participation Restrictions Other    Stability/Clinical Decision Making Evolving/Moderate complexity    Rehab Potential Good    PT Frequency 2x / week    PT Duration 6 weeks    PT Treatment/Interventions ADLs/Self Care Home Management;Cryotherapy;Electrical Stimulation;Ultrasound;Moist Heat;Therapeutic activities;Therapeutic exercise;Manual techniques;Patient/family education;Passive range of motion;Dry needling;Vasopneumatic Device    PT Next Visit Plan Modalites and STW/M to patient's neck and right shoulder.  Begin P and AAROM right shoulder range of motion and establish a HEP.  Chin tucks, cervical extension and bilateral active rotation.    Consulted and Agree with Plan of Care Patient           Patient will benefit from skilled therapeutic  intervention in order to improve the following deficits and impairments:  Pain, Increased muscle spasms, Decreased activity tolerance, Decreased strength, Postural dysfunction, Decreased range of motion  Visit Diagnosis: Acute pain of right shoulder  Cervicalgia  Stiffness  of right shoulder, not elsewhere classified     Problem List Patient Active Problem List   Diagnosis Date Noted  . Right lower quadrant abdominal pain 04/19/2020  . Epigastric pain   . Enterocolitis 02/26/2020  . Diarrhea 08/03/2019  . Epigastric abdominal tenderness 05/30/2019  . Obesity, Class III, BMI 40-49.9 (morbid obesity) (HCC) 03/31/2019  . Chronic use of benzodiazepine for therapeutic purpose 01/26/2018  . Hiatal hernia 10/29/2017  . Hyperlipidemia associated with type 2 diabetes mellitus (HCC) 12/31/2015  . Coronary artery disease due to lipid rich plaque 12/31/2015  . Left knee pain 10/24/2015  . 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 Essential hypertension 02/20/2015  . Chronic low back pain with sciatica 02/20/2015  . Gastroesophageal reflux disease without esophagitis 11/20/2014  . Anxiety, generalized 11/20/2014  . Hypothyroidism 12/27/2013  . Type 2 diabetes mellitus without complication, without long-term current use of insulin (HCC) 12/27/2013  . Precordial pain 01/15/2013    Sharmon Leyden, PT, MPT 08/06/2020, 10:59 AM  [facility] [address] [place], [place], [address] Phone: [phone]   Fax:  [phone]  Name: Cynthia Mays MRN: [id number] Date of Birth: 1940/10/21

## 2020-08-07 DIAGNOSIS — M20022 Boutonniere deformity of left finger(s): Secondary | ICD-10-CM | POA: Diagnosis not present

## 2020-08-07 DIAGNOSIS — R600 Localized edema: Secondary | ICD-10-CM | POA: Diagnosis not present

## 2020-08-07 DIAGNOSIS — M65351 Trigger finger, right little finger: Secondary | ICD-10-CM | POA: Diagnosis not present

## 2020-08-07 DIAGNOSIS — M79645 Pain in left finger(s): Secondary | ICD-10-CM | POA: Diagnosis not present

## 2020-08-07 DIAGNOSIS — M25642 Stiffness of left hand, not elsewhere classified: Secondary | ICD-10-CM | POA: Diagnosis not present

## 2020-08-09 ENCOUNTER — Other Ambulatory Visit: Payer: Self-pay

## 2020-08-09 ENCOUNTER — Ambulatory Visit: Payer: Medicare Other | Admitting: Physical Therapy

## 2020-08-09 DIAGNOSIS — M25511 Pain in right shoulder: Secondary | ICD-10-CM

## 2020-08-09 DIAGNOSIS — M25611 Stiffness of right shoulder, not elsewhere classified: Secondary | ICD-10-CM | POA: Diagnosis not present

## 2020-08-09 DIAGNOSIS — M542 Cervicalgia: Secondary | ICD-10-CM | POA: Diagnosis not present

## 2020-08-09 NOTE — Therapy (Signed)
[facility] [place], [place], [address] Phone: [phone]   Fax:  [phone]  Physical Therapy Treatment  Patient Details  Name: Cynthia Mays MRN: [id number] Date of Birth: [birth year] Referring Provider (PT): Sanjuana Kava.   Encounter Date: 08/09/2020   PT End of Session - 08/09/20 1105    Visit Number 9    Number of Visits 12    Date for PT Re-Evaluation 08/16/20    Authorization Type FOTO 9th visit 34%    PT Start Time 1030    PT Stop Time 1124    PT Time Calculation (min) 54 min    Activity Tolerance Patient tolerated treatment well    Behavior During Therapy WFL for tasks assessed/performed           Past Medical History:  Diagnosis Date  . Complication of anesthesia   . Depression   . Diabetes mellitus   . Fibromyalgia   . GERD (gastroesophageal reflux disease)   . Hiatal hernia   . Hyperlipidemia   . Hypertension   . Hypothyroidism   . Hypothyroidism 07/23/2016  . PONV (postoperative nausea and vomiting)     Past Surgical History:  Procedure Laterality Date  . APPENDECTOMY  2002  . arthroscopy  right 2002, left 2007   bilateral knees  . BACK SURGERY  10/2007, 09/2008   x2  . BIOPSY  10/02/2016   Procedure: BIOPSY;  Surgeon: Rogene Houston, MD;  [facility];  Service: Endoscopy;;  gastric  . BIOPSY  05/04/2020   Procedure: BIOPSY;  Surgeon: Montez Morita, Quillian Quince, MD;  [facility];  Service: Gastroenterology;;  . [place]   bilateral  . [place]   x2  . COLONOSCOPY WITH PROPOFOL N/A 05/04/2020   Procedure: COLONOSCOPY WITH PROPOFOL;  Surgeon: Harvel Quale, MD;  [facility];  Service: Gastroenterology;  Laterality: N/A;  11  . ESOPHAGOGASTRODUODENOSCOPY N/A 10/02/2016   Procedure: ESOPHAGOGASTRODUODENOSCOPY (EGD);  Surgeon: Rogene Houston, MD;  [facility];  Service: Endoscopy;  Laterality:  N/A;  11:15 - moved to 2/1 @ 3:00  . ESOPHAGOGASTRODUODENOSCOPY (EGD) WITH PROPOFOL N/A 05/04/2020   Procedure: ESOPHAGOGASTRODUODENOSCOPY (EGD) WITH PROPOFOL;  Surgeon: Harvel Quale, MD;  [facility];  Service: Gastroenterology;  Laterality: N/A;  . KNEE SURGERY     both    There were no vitals filed for this visit.   Subjective Assessment - 08/09/20 1042    Subjective Covid-19 screen performed upon arriva. lPain in shoulder ongoing and neck is better    Pertinent History DM, Fibromyalgia, Back surgery, CTS, Hypothyroidism, knee surgery.    Currently in Pain? Yes    Pain Score 2     Pain Location Shoulder    Pain Orientation Right    Pain Descriptors / Indicators Discomfort    Pain Type Acute pain    Pain Onset More than a month ago    Pain Frequency Intermittent    Aggravating Factors  use of shoulder    Pain Relieving Factors at rest              [facility] PT Assessment - 08/09/20 0001      ROM / Strength   AROM / PROM / Strength AROM;PROM      AROM   AROM Assessment Site Shoulder;Cervical    Right/Left Shoulder Right    Right Shoulder Flexion 93 Degrees    Right  Shoulder External Rotation 50 Degrees    Cervical - Right Rotation 50    Cervical - Left Rotation 65      PROM   PROM Assessment Site Shoulder    Right/Left Shoulder Right    Right Shoulder Flexion 126 Degrees    Right Shoulder External Rotation 60 Degrees                         [facility] Adult PT Treatment/Exercise - 08/09/20 0001      Shoulder Exercises: Pulleys   Flexion 5 minutes    Other Pulley Exercises seated ranger RT UE x 5 mins flexion/ext, CW,CCW      Moist Heat Therapy   Number Minutes Moist Heat 15 Minutes    Moist Heat Location Shoulder      Electrical Stimulation   Electrical Stimulation Location R shoulder    Electrical Stimulation Action premod 80-150hz x28mn    Electrical Stimulation Goals Tone;Pain      Manual Therapy   Manual Therapy  Passive ROM    Passive ROM manual PROM/AAROM for all right shoulder movements with gentle holds to improve mobility                       PT Long Term Goals - 08/09/20 1107      PT LONG TERM GOAL #1   Title Independent with a HEP.    Time 6    Period Weeks    Status On-going      PT LONG TERM GOAL #2   Title Increase active cervical rotation to 60-65 degrees+ so patient can turn head more easily while driving.    Baseline RT 50 /LT 65 degrees 08/09/20    Time 6    Period Weeks    Status Partially Met      PT LONG TERM GOAL #3   Title Active right shoulder flexion to 135 degrees so the patient can easily reach overhead.    Baseline AROM 93 degrees 08/09/20    Time 6    Period Weeks    Status On-going      PT LONG TERM GOAL #4   Title Active ER to 70 degrees+ to allow for easily donning/doffing of apparel.    Baseline AROM 50 degrees 08/09/20    Time 6    Period Weeks    Status On-going      PT LONG TERM GOAL #5   Title Perform ADL's with pain not > 2-3/10.    Baseline Pain level up to 9/10 after yard work 08/09/20    Time 6    Period Weeks    Status On-going                 Plan - 08/09/20 1125    Clinical Impression Statement Patient tolerated treatment well today and reported less pain upon arrival. Patient has reported no pain in neck and improved mobility with rotation, yet ongoing limitations with right rotation. Patient has improved with right shoulder movements for flexion and ER yet ongoing tightness. Patient has reported doing self ROM activities daily. Patient partial met goal this week with others progressing.    Personal Factors and Comorbidities Comorbidity 1;Comorbidity 2    Comorbidities DM, Fibromyalgia, Back surgery, CTS, Hypothyroidism, knee surgery.    Examination-Activity Limitations Reach Overhead;Other    Examination-Participation Restrictions Other    Stability/Clinical Decision Making Evolving/Moderate complexity    Rehab  Potential Good  PT Frequency 2x / week    PT Duration 6 weeks    PT Treatment/Interventions ADLs/Self Care Home Management;Cryotherapy;Electrical Stimulation;Ultrasound;Moist Heat;Therapeutic activities;Therapeutic exercise;Manual techniques;Patient/family education;Passive range of motion;Dry needling;Vasopneumatic Device    PT Next Visit Plan cont with POC for ROM for cervical and right shoulder and modalities PRN/ MD note next visit for F/U appt and 10th visit progress (Please issue HEP for cane flexion/ER in supine, corner stretch)    Consulted and Agree with Plan of Care Patient           Patient will benefit from skilled therapeutic intervention in order to improve the following deficits and impairments:  Pain,Increased muscle spasms,Decreased activity tolerance,Decreased strength,Postural dysfunction,Decreased range of motion  Visit Diagnosis: Acute pain of right shoulder  Cervicalgia  Stiffness of right shoulder, not elsewhere classified     Problem List Patient Active Problem List   Diagnosis Date Noted  . Right lower quadrant abdominal pain 04/19/2020  . Epigastric pain   . Enterocolitis 02/26/2020  . Diarrhea 08/03/2019  . Epigastric abdominal tenderness 05/30/2019  . Obesity, Class III, BMI 40-49.9 (morbid obesity) ([place]) 03/31/2019  . Chronic use of benzodiazepine for therapeutic purpose 01/26/2018  . Hiatal hernia 10/29/2017  . Hyperlipidemia associated with type 2 diabetes mellitus ([place]) 12/31/2015  . Coronary artery disease due to lipid rich plaque 12/31/2015  . Left knee pain 10/24/2015  . 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 Essential hypertension 02/20/2015  . Chronic low back pain with sciatica 02/20/2015  . Gastroesophageal reflux disease without esophagitis 11/20/2014  . Anxiety, generalized 11/20/2014  . Hypothyroidism 12/27/2013  . Type 2 diabetes mellitus without complication, without long-term current use of insulin ([place]) 12/27/2013  .  Precordial pain 01/15/2013    Phillips Climes, PTA 08/09/2020, 11:33 AM  [facility] [place], [place], [address] Phone: [phone]   Fax:  [phone]  Name: Cynthia Mays MRN: [id number] Date of Birth: [birth year]

## 2020-08-10 ENCOUNTER — Other Ambulatory Visit: Payer: Self-pay | Admitting: *Deleted

## 2020-08-10 DIAGNOSIS — E039 Hypothyroidism, unspecified: Secondary | ICD-10-CM

## 2020-08-10 MED ORDER — LEVOTHYROXINE SODIUM 112 MCG PO TABS: ORAL_TABLET | ORAL | 1 refills | Status: DC

## 2020-08-10 NOTE — Telephone Encounter (Signed)
June TSH normal RF sent till repeat needed in June

## 2020-08-13 ENCOUNTER — Ambulatory Visit: Payer: Medicare Other | Admitting: Physical Therapy

## 2020-08-13 ENCOUNTER — Other Ambulatory Visit: Payer: Self-pay

## 2020-08-13 ENCOUNTER — Telehealth (INDEPENDENT_AMBULATORY_CARE_PROVIDER_SITE_OTHER): Payer: Medicare Other | Admitting: Gastroenterology

## 2020-08-13 ENCOUNTER — Encounter (INDEPENDENT_AMBULATORY_CARE_PROVIDER_SITE_OTHER): Payer: Self-pay | Admitting: Gastroenterology

## 2020-08-13 ENCOUNTER — Encounter: Payer: Self-pay | Admitting: Physical Therapy

## 2020-08-13 DIAGNOSIS — M25611 Stiffness of right shoulder, not elsewhere classified: Secondary | ICD-10-CM

## 2020-08-13 DIAGNOSIS — M25511 Pain in right shoulder: Secondary | ICD-10-CM | POA: Diagnosis not present

## 2020-08-13 DIAGNOSIS — K633 Ulcer of intestine: Secondary | ICD-10-CM | POA: Diagnosis not present

## 2020-08-13 DIAGNOSIS — M542 Cervicalgia: Secondary | ICD-10-CM

## 2020-08-13 DIAGNOSIS — K529 Noninfective gastroenteritis and colitis, unspecified: Secondary | ICD-10-CM

## 2020-08-13 MED ORDER — MESALAMINE 1.2 G PO TBEC: 2.4000 g | DELAYED_RELEASE_TABLET | Freq: Every day | ORAL | 2 refills | Status: DC

## 2020-08-13 NOTE — Therapy (Signed)
[facility] [address] [place], [place], [address] Phone: [phone]   Fax:  [phone]  Physical Therapy Treatment  Patient Details  Name: Cynthia Mays MRN: [id number] Date of Birth: 1941-06-11 Referring Provider (PT): Darreld Mclean.   Encounter Date: 08/13/2020   PT End of Session - 08/13/20 0953    Visit Number 10    Number of Visits 12    Date for PT Re-Evaluation 08/16/20    Authorization Type FOTO 9th visit 34%    PT Start Time 0947    PT Stop Time 1030    PT Time Calculation (min) 43 min    Activity Tolerance Patient tolerated treatment well    Behavior During Therapy [facility] for tasks assessed/performed           Past Medical History:  Diagnosis Date  . Complication of anesthesia   . Depression   . Diabetes mellitus   . Fibromyalgia   . GERD (gastroesophageal reflux disease)   . Hiatal hernia   . Hyperlipidemia   . Hypertension   . Hypothyroidism   . Hypothyroidism 07/23/2016  . PONV (postoperative nausea and vomiting)     Past Surgical History:  Procedure Laterality Date  . APPENDECTOMY  2002  . arthroscopy  right 2002, left 2007   bilateral knees  . BACK SURGERY  10/2007, 09/2008   x2  . BIOPSY  10/02/2016   Procedure: BIOPSY;  Surgeon: Malissa Hippo, MD;  [facility];  Service: Endoscopy;;  gastric  . BIOPSY  05/04/2020   Procedure: BIOPSY;  Surgeon: Marguerita Merles, Reuel Boom, MD;  [facility];  Service: Gastroenterology;;  . Fidela Salisbury RELEASE  1986   bilateral  . CESAREAN SECTION  1969, 1967   x2  . COLONOSCOPY WITH PROPOFOL N/A 05/04/2020   Procedure: COLONOSCOPY WITH PROPOFOL;  Surgeon: Dolores Frame, MD;  [facility];  Service: Gastroenterology;  Laterality: N/A;  11  . ESOPHAGOGASTRODUODENOSCOPY N/A 10/02/2016   Procedure: ESOPHAGOGASTRODUODENOSCOPY (EGD);  Surgeon: Malissa Hippo, MD;  [facility];  Service: Endoscopy;  Laterality:  N/A;  11:15 - moved to 2/1 @ 3:00  . ESOPHAGOGASTRODUODENOSCOPY (EGD) WITH PROPOFOL N/A 05/04/2020   Procedure: ESOPHAGOGASTRODUODENOSCOPY (EGD) WITH PROPOFOL;  Surgeon: Dolores Frame, MD;  [facility];  Service: Gastroenterology;  Laterality: N/A;  . KNEE SURGERY     both    There were no vitals filed for this visit.   Subjective Assessment - 08/13/20 0952    Subjective Covid-19 screen performed upon arriva. Reports pain in anterior aspect of R shoulder.    Pertinent History DM, Fibromyalgia, Back surgery, CTS, Hypothyroidism, knee surgery.    Currently in Pain? Yes    Pain Score 2     Pain Location Shoulder    Pain Orientation Right    Pain Descriptors / Indicators Discomfort    Pain Type Acute pain    Pain Onset More than a month ago    Pain Frequency Intermittent              [facility] PT Assessment - 08/13/20 0001      Assessment   Medical Diagnosis Neck pain.    Referring Provider (PT) Darreld Mclean.    Next MD Visit 08/14/2020      Precautions   Precautions None      Restrictions   Weight Bearing Restrictions No      Observation/Other Assessments   Focus on Therapeutic Outcomes (FOTO)  37% limitation, CJ status      ROM / Strength   AROM / PROM / Strength PROM;AROM      AROM   Overall AROM  Within functional limits for tasks performed;Deficits    AROM Assessment Site Cervical    Cervical - Right Rotation 63    Cervical - Left Rotation 75      PROM   Overall PROM  Deficits    PROM Assessment Site Shoulder    Right Shoulder Flexion 140 Degrees    Right Shoulder Internal Rotation 40 Degrees    Right Shoulder External Rotation 46 Degrees                         [facility] Adult PT Treatment/Exercise - 08/13/20 0001      Shoulder Exercises: Supine   Protraction AAROM;Both;20 reps    External Rotation AAROM;Right;15 reps    Flexion AAROM;Both;15 reps      Shoulder Exercises: Pulleys   Flexion 5 minutes      Shoulder  Exercises: ROM/Strengthening   Ranger UE ranger in sitting flex/ext x3 min, CW circles x3 min    Other ROM/Strengthening Exercises Wall ladder x5 reps (level 19 max)      Modalities   Modalities Electrical Stimulation      Electrical Stimulation   Electrical Stimulation Location R shoulder    Electrical Stimulation Action Pre-Mod    Electrical Stimulation Parameters 80-150 hz x10 min    Electrical Stimulation Goals Pain                       PT Long Term Goals - 08/13/20 1025      PT LONG TERM GOAL #1   Title Independent with a HEP.    Time 6    Period Weeks    Status Achieved      PT LONG TERM GOAL #2   Title Increase active cervical rotation to 60-65 degrees+ so patient can turn head more easily while driving.    Baseline RT 50 /LT 65 degrees 08/09/20    Time 6    Period Weeks    Status Partially Met      PT LONG TERM GOAL #3   Title Active right shoulder flexion to 135 degrees so the patient can easily reach overhead.    Baseline AROM 93 degrees 08/09/20    Time 6    Period Weeks    Status Not Met      PT LONG TERM GOAL #4   Title Active ER to 70 degrees+ to allow for easily donning/doffing of apparel.    Baseline AROM 50 degrees 08/09/20    Time 6    Period Weeks    Status Not Met      PT LONG TERM GOAL #5   Title Perform ADL's with pain not > 2-3/10.    Baseline Pain level up to 9/10 after yard work 08/09/20    Time 6    Period Weeks    Status Partially Met                  Patient will benefit from skilled therapeutic intervention in order to improve the following deficits and impairments:     Visit Diagnosis: Acute pain of right shoulder  Cervicalgia  Stiffness of right shoulder, not elsewhere classified     Problem List Patient Active Problem List   Diagnosis Date Noted  . Right lower quadrant abdominal  pain 04/19/2020  . Epigastric pain   . Enterocolitis 02/26/2020  . Diarrhea 08/03/2019  . Epigastric abdominal  tenderness 05/30/2019  . Obesity, Class III, BMI 40-49.9 (morbid obesity) ([place]) 03/31/2019  . Chronic use of benzodiazepine for therapeutic purpose 01/26/2018  . Hiatal hernia 10/29/2017  . Hyperlipidemia associated with type 2 diabetes mellitus ([place]) 12/31/2015  . Coronary artery disease due to lipid rich plaque 12/31/2015  . Left knee pain 10/24/2015  . 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 Essential hypertension 02/20/2015  . Chronic low back pain with sciatica 02/20/2015  . Gastroesophageal reflux disease without esophagitis 11/20/2014  . Anxiety, generalized 11/20/2014  . Hypothyroidism 12/27/2013  . Type 2 diabetes mellitus without complication, without long-term current use of insulin ([place]) 12/27/2013  . Precordial pain 01/15/2013    Standley Brooking 08/13/2020, 10:47 AM  [facility] [address] [place], [place], [address] Phone: [phone]   Fax:  [phone]  Name: Cynthia Mays MRN: [id number] Date of Birth: [birth year]

## 2020-08-13 NOTE — Patient Instructions (Addendum)
Perform blood workup Continue Mesalamine 2 tablets every day

## 2020-08-13 NOTE — Progress Notes (Signed)
Cynthia Mays, M.D. Gastroenterology & Hepatology [facility] [address] [place], [place] [address] Primary Care Physician: Junie Spencer, FNP [address] [place] [address]  This is a phone visit.  It required patient-provider interaction for the medical decision making as documented below. The patient has consented and agreed to proceed with a phone clinic encounter given the current Coronavirus pandemic.  VIRTUAL VISIT NOTE Patient location: home Provider location: office  I will communicate my assessment and recommendations to the referring MD via EMR. Note: Occasional unusual wording and randomly placed punctuation marks may result from the use of speech recognition technology to transcribe this document"  Problems: 1. Cecal ulcer 2. GERD 3. Large hiatal hernia with Sheria Lang lesions  History of Present Illness: Cynthia Mays is a 79 y.o. female with past medical history of depression, diabetes, fibromyalgia, GERD, large hiatal hernia with Sheria Lang lesions, hyperlipidemia, hypertension hypothyroidism, who presents for follow up of single cecal ulcer.  The patient was last seen on 04/19/2020. At that time, the patient was ordered to undergo EGD and colonoscopy for evaluation of diarrhea.  She was advised to start taking Benefiber to increase the bulk of her stool.  Patient underwent both EGD and colonoscopy on 05/04/2020. EGD showed a 4 cm hiatal hernia with 4 Cameron ulcers that were not bleeding, multiple small sessile polyps that were positive for fundic gland polyps on histology, presence of erythema in the pylorus, negative for H. pylori in biopsies, normal duodenum.  Colonoscopy on the same day showed an 8 mm ulcer in the cecum which was biopsied from the sites and also in the base, histology was negative for any chronic alterations but it was negative for CMV and HSV.  No intervention was performed as the  bleeding stopped on its own, there was presence of single AVM, hemorrhoids. Due to her ulcerations, the patient was referred for repair of hiatal hernia but the patient states that after discussing the benefits and risks with the surgeon, she declined undergoing surgery and will like to continue taking her omeprazole twice a day.  She was also put on mesalamine 2.4 g every day since then.  The patient states that since he started taking the medication her abdominal pain has completely resolved and has not presented more diarrhea.  Denies having any nausea, vomiting, fever, chills, hematochezia, melena, hematemesis, abdominal distention, abdominal pain, jaundice, pruritus or weight loss.   Past Medical History: Past Medical History:  Diagnosis Date  . Complication of anesthesia   . Depression   . Diabetes mellitus   . Fibromyalgia   . GERD (gastroesophageal reflux disease)   . Hiatal hernia   . Hyperlipidemia   . Hypertension   . Hypothyroidism   . Hypothyroidism 07/23/2016  . PONV (postoperative nausea and vomiting)     Past Surgical History: Past Surgical History:  Procedure Laterality Date  . APPENDECTOMY  2002  . arthroscopy  right 2002, left 2007   bilateral knees  . BACK SURGERY  10/2007, 09/2008   x2  . BIOPSY  10/02/2016   Procedure: BIOPSY;  Surgeon: Malissa Hippo, MD;  [facility];  Service: Endoscopy;;  gastric  . BIOPSY  05/04/2020   Procedure: BIOPSY;  Surgeon: Marguerita Merles, Reuel Boom, MD;  [facility];  Service: Gastroenterology;;  . Fidela Salisbury RELEASE  1986   bilateral  . CESAREAN SECTION  1969, 1967   x2  . COLONOSCOPY WITH PROPOFOL N/A 05/04/2020  Procedure: COLONOSCOPY WITH PROPOFOL;  Surgeon: Dolores Frame, MD;  [facility];  Service: Gastroenterology;  Laterality: N/A;  11  . ESOPHAGOGASTRODUODENOSCOPY N/A 10/02/2016   Procedure: ESOPHAGOGASTRODUODENOSCOPY (EGD);  Surgeon: Malissa Hippo, MD;  [facility];  Service: Endoscopy;  Laterality: N/A;  11:15 - moved to 2/1 @ 3:00  . ESOPHAGOGASTRODUODENOSCOPY (EGD) WITH PROPOFOL N/A 05/04/2020   Procedure: ESOPHAGOGASTRODUODENOSCOPY (EGD) WITH PROPOFOL;  Surgeon: Dolores Frame, MD;  [facility];  Service: Gastroenterology;  Laterality: N/A;  . KNEE SURGERY     both    Family History: Family History  Problem Relation Age of Onset  . Anesthesia problems Mother   . CAD Mother 61  . Cancer Mother        Pancreatic cancer  . Hypertension Father   . Suicidality Father        gunshot  . Deafness Sister   . Heart disease Brother   . Suicidality Son   . Cancer Sister        female   . Suicidality Brother   . Hypotension Neg Hx   . Pseudochol deficiency Neg Hx   . Malignant hyperthermia Neg Hx     Social History: Social History   Tobacco Use  Smoking Status Former Smoker  . Packs/day: 1.00  . Years: 43.00  . Pack years: 43.00  . Types: Cigarettes  . Quit date: 05/02/1970  . Years since quitting: 50.3  Smokeless Tobacco Never Used  Tobacco Comment   > 20 years quit   Social History   Substance and Sexual Activity  Alcohol Use No   Comment: occasionally wine   Social History   Substance and Sexual Activity  Drug Use No    Allergies: Allergies  Allergen Reactions  . Codeine Other (See Comments)    Headache, gi upset  . Penicillins Rash    Has patient had a PCN reaction causing immediate rash, facial/tongue/throat swelling, SOB or lightheadedness with hypotension: Yes Has patient had a PCN reaction causing severe rash involving mucus membranes or skin necrosis: No Has patient had a PCN reaction that required hospitalization No Has patient had a PCN reaction occurring within the last 10 years: No If all of the above answers are "NO", then may proceed with Cephalosporin use.   . Pravastatin     Leg cramps    Medications: Current Outpatient Medications  Medication Sig Dispense Refill  .  cholecalciferol (VITAMIN D3) 25 MCG (1000 UNIT) tablet Take 1,000 Units by mouth daily.     . clobetasol cream (TEMOVATE) 0.05 % APPLY 1 APPLICATION TOPICALLY ONCE DAILY AS NEEDED FOR  RASH 30 g 11  . diazepam (VALIUM) 5 MG tablet Take 5 mg by mouth every 6 (six) hours as needed for anxiety or muscle spasms.    . famotidine (PEPCID) 40 MG tablet TAKE 1 TABLET BY MOUTH AT BEDTIME 90 tablet 0  . fexofenadine (ALLEGRA) 180 MG tablet Take 180 mg by mouth daily.    . fluticasone (FLONASE) 50 MCG/ACT nasal spray Place 2 sprays into the nose daily.    Marland Kitchen HYDROcodone-acetaminophen (NORCO/VICODIN) 5-325 MG tablet Take 1 tablet by mouth every 6 (six) hours as needed for moderate pain. 30 tablet 0  . levothyroxine (EUTHYROX) 112 MCG tablet TAKE 1 TABLET BY MOUTH ONCE DAILY BEFORE BREAKFAST 90 tablet 1  . metFORMIN (GLUCOPHAGE) 500 MG tablet Take 1 tablet (500 mg total) by mouth 2 (two) times daily. (Needs to be seen before  next refill) 60 tablet 0  . Multiple Vitamins-Minerals (MULTIVITAMINS THER. W/MINERALS) TABS Take 1 tablet by mouth daily.    . pantoprazole (PROTONIX) 40 MG tablet Take 40 mg by mouth 2 (two) times daily.    . Probiotic Product (PHILLIPS COLON HEALTH) CAPS Take 1 capsule by mouth daily.     . rosuvastatin (CRESTOR) 5 MG tablet Take 1 tablet by mouth once daily 90 tablet 0  . sertraline (ZOLOFT) 50 MG tablet TAKE 1 & 1/2 (ONE & ONE-HALF) TABLETS BY MOUTH ONCE DAILY 135 tablet 0  . mesalamine (LIALDA) 1.2 g EC tablet Take 2 tablets (2.4 g total) by mouth daily with breakfast. 60 tablet 2  . ondansetron (ZOFRAN ODT) 8 MG disintegrating tablet Take 1 tablet (8 mg total) by mouth every 8 (eight) hours as needed for nausea or vomiting. (Patient not taking: Reported on 08/13/2020) 20 tablet 0  . PLENVU 140 g SOLR TAKE 1 MOUTH FOR ONE DOSE (Patient not taking: Reported on 08/13/2020) 3 each 0   No current facility-administered medications for this visit.    Review of Systems: GENERAL: negative  for malaise, night sweats HEENT: No changes in hearing or vision, no nose bleeds or other nasal problems. NECK: Negative for lumps, goiter, pain and significant neck swelling RESPIRATORY: Negative for cough, wheezing CARDIOVASCULAR: Negative for chest pain, leg swelling, palpitations, orthopnea GI: SEE HPI MUSCULOSKELETAL: Negative for joint pain or swelling, back pain, and muscle pain. SKIN: Negative for lesions, rash PSYCH: Negative for sleep disturbance, mood disorder and recent psychosocial stressors. HEMATOLOGY Negative for prolonged bleeding, bruising easily, and swollen nodes. ENDOCRINE: Negative for cold or heat intolerance, polyuria, polydipsia and goiter. NEURO: negative for tremor, gait imbalance, syncope and seizures. The remainder of the review of systems is noncontributory.   Physical Exam: Not performed as it was a phone encounter - patient could not connect to the mobile app for Telehealth encounter  Imaging/Labs: as above  I personally reviewed and interpreted the available labs, imaging and endoscopic files.  Impression and Plan: Cynthia Mays is a 79 y.o. female with past medical history of depression, diabetes, fibromyalgia, GERD, large hiatal hernia with Sheria Lang lesions, hyperlipidemia, hypertension hypothyroidism, who presents for follow up of single ulcer in the cecum.  During her evaluation for diarrhea, the patient was found to have single ulcer in her cecum that was negative for any viral staining.  The patient was started on mesalamine with adequate improvement of her symptoms.  It is possible that this could be related to an episode of colonic Crohn's disease that has clinically improved with intake of 5-ASA compounds.  Given the drastic change in her symptoms, we will just continue taking this medication indefinitely, will check repeat surveillance labs such as CBC and CMP today.  The patient was found to have incidentally presence of a large hiatal hernia  with Sheria Lang lesions, but she is not interested in pursuing surgical repair, for which she will need to continue taking PPI twice daily to avoid worsening anemia.  Patient understood and agreed.  - Check CBC and CMP - Continue Mesalamine 2.4 g every day - C/w PPI BID PO - RTC 1 year  All questions were answered.      Telephone encounter time: I spent a total of  20 minutes  Cynthia Blazing, MD Gastroenterology and Hepatology [facility]

## 2020-08-14 ENCOUNTER — Ambulatory Visit (INDEPENDENT_AMBULATORY_CARE_PROVIDER_SITE_OTHER): Payer: Medicare Other | Admitting: Orthopaedic Surgery

## 2020-08-14 ENCOUNTER — Encounter: Payer: Self-pay | Admitting: Orthopaedic Surgery

## 2020-08-14 VITALS — BP 140/80 | HR 76 | Ht 59.0 in | Wt 181.0 lb

## 2020-08-14 DIAGNOSIS — M25511 Pain in right shoulder: Secondary | ICD-10-CM | POA: Diagnosis not present

## 2020-08-14 DIAGNOSIS — G8929 Other chronic pain: Secondary | ICD-10-CM

## 2020-08-14 DIAGNOSIS — M542 Cervicalgia: Secondary | ICD-10-CM

## 2020-08-14 NOTE — Progress Notes (Signed)
[name], female DOB:[birth year], 79 y.o. QMG:[id number]  Chief Complaint  Patient presents with  . Neck Pain    Neck pain    HPI  Cynthia Mays is a 79 y.o. female who has neck pain.  She has been going to PT.  I have reviewed the notes.  She is making good progress.  She has pain of the right shoulder with more pain anteriorly.  She has no swelling. Motion is good. She has no trauma.   Body mass index is 36.56 kg/m.  ROS  Review of Systems  Constitutional: Positive for activity change.  Musculoskeletal: Positive for arthralgias, neck pain and neck stiffness.  All other systems reviewed and are negative.   All other systems reviewed and are negative.  The following is a summary of the past history medically, past history surgically, known current medicines, social history and family history.  This information is gathered electronically by the computer from prior information and documentation.  I review this each visit and have found including this information at this point in the chart is beneficial and informative.    Past Medical History:  Diagnosis Date  . Complication of anesthesia   . Depression   . Diabetes mellitus   . Fibromyalgia   . GERD (gastroesophageal reflux disease)   . Hiatal hernia   . Hyperlipidemia   . Hypertension   . Hypothyroidism   . Hypothyroidism 07/23/2016  . PONV (postoperative nausea and vomiting)     Past Surgical History:  Procedure Laterality Date  . APPENDECTOMY  2002  . arthroscopy  right 2002, left 2007   bilateral knees  . BACK SURGERY  10/2007, 09/2008   x2  . BIOPSY  10/02/2016   Procedure: BIOPSY;  Surgeon: Cynthia Hippo, MD;  [facility];  Service: Endoscopy;;  gastric  . BIOPSY  05/04/2020   Procedure: BIOPSY;  Surgeon: Cynthia Mays, Cynthia Boom, MD;  [facility]: [facility];  Service: Gastroenterology;;  . Fidela Salisbury RELEASE  1986   bilateral  . CESAREAN SECTION  1969, 1967   x2   . COLONOSCOPY WITH PROPOFOL N/A 05/04/2020   Procedure: COLONOSCOPY WITH PROPOFOL;  Surgeon: Cynthia Frame, MD;  [facility]: [facility];  Service: Gastroenterology;  Laterality: N/A;  11  . ESOPHAGOGASTRODUODENOSCOPY N/A 10/02/2016   Procedure: ESOPHAGOGASTRODUODENOSCOPY (EGD);  Surgeon: Cynthia Hippo, MD;  [facility];  Service: Endoscopy;  Laterality: N/A;  11:15 - moved to 2/1 @ 3:00  . ESOPHAGOGASTRODUODENOSCOPY (EGD) WITH PROPOFOL N/A 05/04/2020   Procedure: ESOPHAGOGASTRODUODENOSCOPY (EGD) WITH PROPOFOL;  Surgeon: Cynthia Frame, MD;  [facility];  Service: Gastroenterology;  Laterality: N/A;  . KNEE SURGERY     both    Family History  Problem Relation Age of Onset  . Anesthesia problems Mother   . CAD Mother 55  . Cancer Mother        Pancreatic cancer  . Hypertension Father   . Suicidality Father        gunshot  . Deafness Sister   . Heart disease Brother   . Suicidality Son   . Cancer Sister        female   . Suicidality Brother   . Hypotension Neg Hx   . Pseudochol deficiency Neg Hx   . Malignant hyperthermia Neg Hx     Social History Social History   Tobacco Use  . Smoking status: Former Smoker    Packs/day: 1.00    Years:  43.00    Pack years: 43.00    Types: Cigarettes    Quit date: 05/02/1970    Years since quitting: 50.3  . Smokeless tobacco: Never Used  . Tobacco comment: > 20 years quit  Vaping Use  . Vaping Use: Never used  Substance Use Topics  . Alcohol use: No    Comment: occasionally wine  . Drug use: No    Allergies  Allergen Reactions  . Codeine Other (See Comments)    Headache, gi upset  . Penicillins Rash    Has patient had a PCN reaction causing immediate rash, facial/tongue/throat swelling, SOB or lightheadedness with hypotension: Yes Has patient had a PCN reaction causing severe rash involving mucus membranes or skin necrosis: No Has patient had a PCN reaction that required  hospitalization No Has patient had a PCN reaction occurring within the last 10 years: No If all of the above answers are "NO", then may proceed with Cephalosporin use.   . Pravastatin     Leg cramps    Current Outpatient Medications  Medication Sig Dispense Refill  . cholecalciferol (VITAMIN D3) 25 MCG (1000 UNIT) tablet Take 1,000 Units by mouth daily.     . clobetasol cream (TEMOVATE) 0.05 % APPLY 1 APPLICATION TOPICALLY ONCE DAILY AS NEEDED FOR  RASH 30 g 11  . diazepam (VALIUM) 5 MG tablet Take 5 mg by mouth every 6 (six) hours as needed for anxiety or muscle spasms.    . famotidine (PEPCID) 40 MG tablet TAKE 1 TABLET BY MOUTH AT BEDTIME 90 tablet 0  . fexofenadine (ALLEGRA) 180 MG tablet Take 180 mg by mouth daily.    . fluticasone (FLONASE) 50 MCG/ACT nasal spray Place 2 sprays into the nose daily.    Marland Kitchen HYDROcodone-acetaminophen (NORCO/VICODIN) 5-325 MG tablet Take 1 tablet by mouth every 6 (six) hours as needed for moderate pain. 30 tablet 0  . levothyroxine (EUTHYROX) 112 MCG tablet TAKE 1 TABLET BY MOUTH ONCE DAILY BEFORE BREAKFAST 90 tablet 1  . mesalamine (LIALDA) 1.2 g EC tablet Take 2 tablets (2.4 g total) by mouth daily with breakfast. 60 tablet 2  . metFORMIN (GLUCOPHAGE) 500 MG tablet Take 1 tablet (500 mg total) by mouth 2 (two) times daily. (Needs to be seen before next refill) 60 tablet 0  . Multiple Vitamins-Minerals (MULTIVITAMINS THER. W/MINERALS) TABS Take 1 tablet by mouth daily.    . ondansetron (ZOFRAN ODT) 8 MG disintegrating tablet Take 1 tablet (8 mg total) by mouth every 8 (eight) hours as needed for nausea or vomiting. (Patient not taking: Reported on 08/13/2020) 20 tablet 0  . pantoprazole (PROTONIX) 40 MG tablet Take 40 mg by mouth 2 (two) times daily.    Marland Kitchen PLENVU 140 g SOLR TAKE 1 MOUTH FOR ONE DOSE (Patient not taking: Reported on 08/13/2020) 3 each 0  . Probiotic Product (PHILLIPS COLON HEALTH) CAPS Take 1 capsule by mouth daily.     . rosuvastatin  (CRESTOR) 5 MG tablet Take 1 tablet by mouth once daily 90 tablet 0  . sertraline (ZOLOFT) 50 MG tablet TAKE 1 & 1/2 (ONE & ONE-HALF) TABLETS BY MOUTH ONCE DAILY 135 tablet 0   No current facility-administered medications for this visit.     Physical Exam  Blood pressure 140/80, pulse 76, height 4\' 11"  (1.499 m), weight 181 lb (82.1 kg).  Constitutional: overall normal hygiene, normal nutrition, well developed, normal grooming, normal body habitus. Assistive device:none  Musculoskeletal: gait and station Limp none, muscle tone and strength  are normal, no tremors or atrophy is present.  .  Neurological: coordination overall normal.  Deep tendon reflex/nerve stretch intact.  Sensation normal.  Cranial nerves II-XII intact.   Skin:   Normal overall no scars, lesions, ulcers or rashes. No psoriasis.  Psychiatric: Alert and oriented x 3.  Recent memory intact, remote memory unclear.  Normal mood and affect. Well groomed.  Good eye contact.  Cardiovascular: overall no swelling, no varicosities, no edema bilaterally, normal temperatures of the legs and arms, no clubbing, cyanosis and good capillary refill.  Lymphatic: palpation is normal.  Neck has good ROM. Examination of right Upper Extremity is done.  Inspection:   Overall:  Elbow non-tender without crepitus or defects, forearm non-tender without crepitus or defects, wrist non-tender without crepitus or defects, hand non-tender.    Shoulder: with glenohumeral joint tenderness, without effusion.   Upper arm:  without swelling and tenderness   Range of motion:   Overall:  Full range of motion of the elbow, full range of motion of wrist and full range of motion in fingers.   Shoulder:  right  160 degrees forward flexion; 150 degrees abduction; 35 degrees internal rotation, 35 degrees external rotation, 15 degrees extension, 40 degrees adduction.   Stability:   Overall:  Shoulder, elbow and wrist stable   Strength and Tone:   Overall  full shoulder muscles strength, full upper arm strength and normal upper arm bulk and tone. All other systems reviewed and are negative   The patient has been educated about the nature of the problem(s) and counseled on treatment options.  The patient appeared to understand what I have discussed and is in agreement with it.  Encounter Diagnoses  Name Primary?  . Neck pain Yes  . Chronic right shoulder pain    PROCEDURE NOTE:  The patient request injection, verbal consent was obtained.  The right shoulder was prepped appropriately after time out was performed.   Sterile technique was observed and injection of 1 cc of Depo-Medrol 40 mg with several cc's of plain xylocaine. Anesthesia was provided by ethyl chloride and a 20-gauge needle was used to inject the shoulder area. A posterior approach was used.  The injection was tolerated well.  A band aid dressing was applied.  The patient was advised to apply ice later today and tomorrow to the injection sight as needed.   PLAN Call if any problems.  Precautions discussed.  Continue current medications.   Return to clinic 1 month   Continue PT.  Electronically Signed Darreld Mclean, MD 12/14/20211:59 PM

## 2020-08-23 ENCOUNTER — Ambulatory Visit (INDEPENDENT_AMBULATORY_CARE_PROVIDER_SITE_OTHER): Payer: Medicare Other | Admitting: Family

## 2020-08-23 ENCOUNTER — Other Ambulatory Visit: Payer: Self-pay

## 2020-08-23 ENCOUNTER — Encounter: Payer: Self-pay | Admitting: Family

## 2020-08-23 ENCOUNTER — Telehealth: Payer: Self-pay

## 2020-08-23 VITALS — BP 119/75 | HR 66 | Temp 97.7°F | Ht 59.0 in | Wt 184.0 lb

## 2020-08-23 DIAGNOSIS — F411 Generalized anxiety disorder: Secondary | ICD-10-CM

## 2020-08-23 DIAGNOSIS — E1169 Type 2 diabetes mellitus with other specified complication: Secondary | ICD-10-CM | POA: Diagnosis not present

## 2020-08-23 DIAGNOSIS — K219 Gastro-esophageal reflux disease without esophagitis: Secondary | ICD-10-CM

## 2020-08-23 DIAGNOSIS — E039 Hypothyroidism, unspecified: Secondary | ICD-10-CM | POA: Diagnosis not present

## 2020-08-23 DIAGNOSIS — Z79899 Other long term (current) drug therapy: Secondary | ICD-10-CM

## 2020-08-23 DIAGNOSIS — E785 Hyperlipidemia, unspecified: Secondary | ICD-10-CM | POA: Diagnosis not present

## 2020-08-23 DIAGNOSIS — E119 Type 2 diabetes mellitus without complications: Secondary | ICD-10-CM

## 2020-08-23 DIAGNOSIS — I10 Essential (primary) hypertension: Secondary | ICD-10-CM

## 2020-08-23 LAB — BAYER DCA HB A1C WAIVED: HB A1C (BAYER DCA - WAIVED): 7.1 % — ABNORMAL HIGH (ref <7.0)

## 2020-08-23 MED ORDER — NIFEDIPINE ER OSMOTIC RELEASE 30 MG PO TB24: 30.0000 mg | ORAL_TABLET | Freq: Every day | ORAL | 1 refills | Status: DC

## 2020-08-23 MED ORDER — BLOOD GLUCOSE METER KIT: PACK | 0 refills | Status: DC

## 2020-08-23 MED ORDER — DIAZEPAM 5 MG PO TABS: 5.0000 mg | ORAL_TABLET | Freq: Four times a day (QID) | ORAL | 2 refills | Status: DC | PRN

## 2020-08-23 NOTE — Telephone Encounter (Signed)
Ok with me 

## 2020-08-23 NOTE — Progress Notes (Signed)
Subjective:    Patient ID: Cynthia Mays, female    DOB: 12/28/1940, 79 y.o.   MRN: [id number]  Chief Complaint  Patient presents with  . Medical Management of Chronic Issues   Pt presents to the office today for chronic follow up.  Diabetes She presents for her follow-up diabetic visit. She has type 2 diabetes mellitus. Her disease course has been stable. Pertinent negatives for diabetes include no blurred vision, no fatigue and no foot paresthesias. Symptoms are stable. Pertinent negatives for diabetic complications include no CVA. Risk factors for coronary artery disease include dyslipidemia, diabetes mellitus, hypertension and sedentary lifestyle. (Does not check BS at home) Eye exam is not current.  Thyroid Problem Presents for follow-up visit. Symptoms include dry skin. Patient reports no cold intolerance, constipation, depressed mood, diaphoresis or fatigue. The symptoms have been stable. Her past medical history is significant for hyperlipidemia.  Hyperlipidemia This is a chronic problem. The current episode started more than 1 year ago. Exacerbating diseases include obesity. Current antihyperlipidemic treatment includes statins. The current treatment provides moderate improvement of lipids. Risk factors for coronary artery disease include diabetes mellitus, dyslipidemia, hypertension, a sedentary lifestyle and post-menopausal.  Gastroesophageal Reflux She complains of belching and heartburn. This is a chronic problem. The current episode started more than 1 year ago. The problem occurs occasionally. The problem has been waxing and waning. Pertinent negatives include no fatigue. She has tried a PPI for the symptoms. The treatment provided moderate relief.  Anxiety Presents for follow-up visit. Symptoms include excessive worry, irritability and restlessness. Patient reports no depressed mood. Symptoms occur most days. The severity of symptoms is moderate.        Review of  Systems  Constitutional: Positive for irritability. Negative for diaphoresis and fatigue.  Eyes: Negative for blurred vision.  Gastrointestinal: Positive for heartburn. Negative for constipation.  Endocrine: Negative for cold intolerance.  All other systems reviewed and are negative.      Objective:   Physical Exam Vitals reviewed.  Constitutional:      General: She is not in acute distress.    Appearance: She is well-developed and well-nourished.  HENT:     Head: Normocephalic and atraumatic.     Right Ear: Tympanic membrane normal.     Left Ear: Tympanic membrane normal.     Mouth/Throat:     Mouth: Oropharynx is clear and moist.  Eyes:     Pupils: Pupils are equal, round, and reactive to light.  Neck:     Thyroid: No thyromegaly.  Cardiovascular:     Rate and Rhythm: Normal rate and regular rhythm.     Pulses: Intact distal pulses.     Heart sounds: Normal heart sounds. No murmur heard.   Pulmonary:     Effort: Pulmonary effort is normal. No respiratory distress.     Breath sounds: Normal breath sounds. No wheezing.  Abdominal:     General: Bowel sounds are normal. There is no distension.     Palpations: Abdomen is soft.     Tenderness: There is no abdominal tenderness.  Musculoskeletal:        General: No tenderness or edema. Normal range of motion.     Cervical back: Normal range of motion and neck supple.  Skin:    General: Skin is warm and dry.  Neurological:     Mental Status: She is alert and oriented to person, place, and time.     Cranial Nerves: No cranial nerve deficit.  Deep Tendon Reflexes: Reflexes are normal and symmetric.  Psychiatric:        Mood and Affect: Mood and affect normal.        Behavior: Behavior normal.        Thought Content: Thought content normal.        Judgment: Judgment normal.       BP 119/75   Pulse 66   Temp 97.7 F (36.5 C) (Temporal)   Ht $R'4\' 11"'Vp$  (1.499 m)   Wt 184 lb (83.5 kg)   BMI 37.16 kg/m       Assessment & Plan:  Cynthia Mays comes in today with chief complaint of Medical Management of Chronic Issues   Diagnosis and orders addressed:  1. Essential hypertension - CBC with Differential/Platelet - CMP14+EGFR - Lipid panel - Bayer DCA Hb A1c Waived - NIFEdipine (PROCARDIA-XL/NIFEDICAL-XL) 30 MG 24 hr tablet; Take 1 tablet (30 mg total) by mouth daily.  Dispense: 90 tablet; Refill: 1  2. Type 2 diabetes mellitus without complication, without long-term current use of insulin (HCC) - CBC with Differential/Platelet - CMP14+EGFR - Lipid panel - Bayer DCA Hb A1c Waived - blood glucose meter kit and supplies; Dispense based on patient and insurance preference. Use up to four times daily as directed. (FOR ICD-10 E10.9, E11.9).  Dispense: 1 each; Refill: 0  3. Hyperlipidemia associated with type 2 diabetes mellitus ([place]) - CBC with Differential/Platelet - CMP14+EGFR - Lipid panel - Bayer DCA Hb A1c Waived  4. Gastroesophageal reflux disease without esophagitis  5. Acquired hypothyroidism  6. Chronic use of benzodiazepine for therapeutic purpose  7. Obesity, Class III, BMI 40-49.9 (morbid obesity) ([place])  8. Anxiety, generalized   Labs pending Health Maintenance reviewed Diet and exercise encouraged  Follow up plan: 3 months, needs appt with Clinical Pharm for education.   Evelina Dun, FNP

## 2020-08-23 NOTE — Telephone Encounter (Signed)
Will wait for other provider to reply.

## 2020-08-23 NOTE — Patient Instructions (Signed)

## 2020-08-24 LAB — CBC WITH DIFFERENTIAL/PLATELET
Basophils Absolute: 0 10*3/uL (ref 0.0–0.2)
Basos: 1 %
EOS (ABSOLUTE): 0.3 10*3/uL (ref 0.0–0.4)
Eos: 5 %
Hematocrit: 39.5 % (ref 34.0–46.6)
Hemoglobin: 12.8 g/dL (ref 11.1–15.9)
Immature Grans (Abs): 0 10*3/uL (ref 0.0–0.1)
Immature Granulocytes: 0 %
Lymphocytes Absolute: 1.5 10*3/uL (ref 0.7–3.1)
Lymphs: 25 %
MCH: 26.7 pg (ref 26.6–33.0)
MCHC: 32.4 g/dL (ref 31.5–35.7)
MCV: 83 fL (ref 79–97)
Monocytes Absolute: 0.5 10*3/uL (ref 0.1–0.9)
Monocytes: 8 %
Neutrophils Absolute: 3.6 10*3/uL (ref 1.4–7.0)
Neutrophils: 61 %
Platelets: 224 10*3/uL (ref 150–450)
RBC: 4.79 x10E6/uL (ref 3.77–5.28)
RDW: 15.5 % — ABNORMAL HIGH (ref 11.7–15.4)
WBC: 5.9 10*3/uL (ref 3.4–10.8)

## 2020-08-24 LAB — CMP14+EGFR
ALT: 15 IU/L (ref 0–32)
AST: 23 IU/L (ref 0–40)
Albumin/Globulin Ratio: 1.5 (ref 1.2–2.2)
Albumin: 4.3 g/dL (ref 3.7–4.7)
Alkaline Phosphatase: 97 IU/L (ref 44–121)
BUN/Creatinine Ratio: 24 (ref 12–28)
BUN: 23 mg/dL (ref 8–27)
Bilirubin Total: 0.3 mg/dL (ref 0.0–1.2)
CO2: 23 mmol/L (ref 20–29)
Calcium: 9.5 mg/dL (ref 8.7–10.3)
Chloride: 103 mmol/L (ref 96–106)
Creatinine, Ser: 0.95 mg/dL (ref 0.57–1.00)
GFR calc Af Amer: 66 mL/min/{1.73_m2} (ref >59)
GFR calc non Af Amer: 58 mL/min/{1.73_m2} — ABNORMAL LOW (ref >59)
Globulin, Total: 2.9 g/dL (ref 1.5–4.5)
Glucose: 108 mg/dL — ABNORMAL HIGH (ref 65–99)
Potassium: 4.7 mmol/L (ref 3.5–5.2)
Sodium: 141 mmol/L (ref 134–144)
Total Protein: 7.2 g/dL (ref 6.0–8.5)

## 2020-08-24 LAB — LIPID PANEL
Chol/HDL Ratio: 2.9 ratio (ref 0.0–4.4)
Cholesterol, Total: 171 mg/dL (ref 100–199)
HDL: 58 mg/dL (ref >39)
LDL Chol Calc (NIH): 91 mg/dL (ref 0–99)
Triglycerides: 127 mg/dL (ref 0–149)
VLDL Cholesterol Cal: 22 mg/dL (ref 5–40)

## 2020-08-25 NOTE — Telephone Encounter (Signed)
Okay by me.

## 2020-08-27 ENCOUNTER — Other Ambulatory Visit: Payer: Self-pay

## 2020-08-27 ENCOUNTER — Other Ambulatory Visit: Payer: Self-pay | Admitting: Family

## 2020-08-27 ENCOUNTER — Ambulatory Visit (INDEPENDENT_AMBULATORY_CARE_PROVIDER_SITE_OTHER): Payer: Medicare Other | Admitting: Pharmacist

## 2020-08-27 ENCOUNTER — Encounter: Payer: Self-pay | Admitting: Pharmacist

## 2020-08-27 VITALS — BP 137/80

## 2020-08-27 DIAGNOSIS — E119 Type 2 diabetes mellitus without complications: Secondary | ICD-10-CM

## 2020-08-27 LAB — SPECIMEN STATUS REPORT

## 2020-08-27 LAB — HEPATITIS C ANTIBODY: Hep C Virus Ab: <0.1 s/co ratio (ref 0.0–0.9)

## 2020-08-27 NOTE — Telephone Encounter (Signed)
Attempted to contact - NVM 

## 2020-08-27 NOTE — Progress Notes (Signed)
    08/27/2020 Name: Cynthia Mays MRN: [id number] DOB: 1941/07/21   S:  38 yoF Presents for diabetes evaluation, education, and management Patient was referred and last seen by Primary Care Provider on 08/23/20.  Insurance coverage/medication affordability: [employer] medicare  Patient reports adherence with medications. . Current diabetes medications include: metformin . Current hypertension medications include: procardia, can't take ACEI Goal 130/80 . Current hyperlipidemia medications include: rosuvastatin   Discussed meal planning options and Plate method for healthy eating . Avoid sugary drinks and desserts . Incorporate balanced protein, non starchy veggies, 1 serving of carbohydrate with each meal . Increase water intake . Increase physical activity as able    O:  Lab Results  Component Value Date   HGBA1C 7.1 (H) 08/23/2020    Vitals:   08/27/20 1558  BP: 137/80    Lipid Panel     Component Value Date/Time   CHOL 171 08/23/2020 1120   TRIG 127 08/23/2020 1120   HDL 58 08/23/2020 1120   CHOLHDL 2.9 08/23/2020 1120   LDLCALC 91 08/23/2020 1120    Home fasting blood sugars: n/a-just got meter, needs education today  2 hour post-meal/random blood sugars: 118 post lunch today.    Clinical Atherosclerotic Cardiovascular Disease (ASCVD): No   The 10-year ASCVD risk score Denman George DC Jr., et al., 2013) is: 52.6%   Values used to calculate the score:     Age: 79 years     Sex: Female     Is Non-Hispanic African American: No     Diabetic: Yes     Tobacco smoker: No     Systolic Blood Pressure: 137 mmHg     Is BP treated: Yes     HDL Cholesterol: 58 mg/dL     Total Cholesterol: 171 mg/dL    A/P:  Diabetes X3GH, A1c increasing, now 7.1% (previously in the 6%s).  Patient is adherent with medication.   -Continue metformin as prescribed  -Consider SGLT2 (farxiga) GFR 58 at next visit  -Patient using AccuChek Guide meter--set up for patient and reviewed  in detail.  Patient was able to verbalize understanding.  Encouraged patient to test in the morning (fasting) 3x weekly  -Extensively discussed pathophysiology of diabetes, recommended lifestyle interventions, dietary effects on blood sugar control  -Counseled on s/sx of and management of hypoglycemia  -Next A1C anticipated 09/11/20.  Written patient instructions provided.  Total time in face to face counseling 25 minutes.   Follow up PCP Clinic Visit ON 09/11/20.    Kieth Brightly, PharmD, BCPS Clinical Pharmacist, [facility]  [facility] Phone [phone]

## 2020-09-04 DIAGNOSIS — M20022 Boutonniere deformity of left finger(s): Secondary | ICD-10-CM | POA: Diagnosis not present

## 2020-09-04 DIAGNOSIS — M65351 Trigger finger, right little finger: Secondary | ICD-10-CM | POA: Diagnosis not present

## 2020-09-11 ENCOUNTER — Ambulatory Visit (INDEPENDENT_AMBULATORY_CARE_PROVIDER_SITE_OTHER): Payer: Medicare Other | Admitting: Family

## 2020-09-11 ENCOUNTER — Other Ambulatory Visit: Payer: Self-pay

## 2020-09-11 ENCOUNTER — Encounter: Payer: Self-pay | Admitting: Family

## 2020-09-11 VITALS — BP 137/77 | HR 71 | Temp 96.8°F | Ht 59.0 in | Wt 185.0 lb

## 2020-09-11 DIAGNOSIS — E119 Type 2 diabetes mellitus without complications: Secondary | ICD-10-CM | POA: Diagnosis not present

## 2020-09-11 DIAGNOSIS — E1169 Type 2 diabetes mellitus with other specified complication: Secondary | ICD-10-CM | POA: Diagnosis not present

## 2020-09-11 DIAGNOSIS — I2510 Atherosclerotic heart disease of native coronary artery without angina pectoris: Secondary | ICD-10-CM

## 2020-09-11 DIAGNOSIS — E785 Hyperlipidemia, unspecified: Secondary | ICD-10-CM | POA: Diagnosis not present

## 2020-09-11 DIAGNOSIS — I10 Essential (primary) hypertension: Secondary | ICD-10-CM | POA: Diagnosis not present

## 2020-09-11 DIAGNOSIS — K219 Gastro-esophageal reflux disease without esophagitis: Secondary | ICD-10-CM | POA: Diagnosis not present

## 2020-09-11 DIAGNOSIS — E039 Hypothyroidism, unspecified: Secondary | ICD-10-CM | POA: Diagnosis not present

## 2020-09-11 DIAGNOSIS — I2583 Coronary atherosclerosis due to lipid rich plaque: Secondary | ICD-10-CM

## 2020-09-11 DIAGNOSIS — F411 Generalized anxiety disorder: Secondary | ICD-10-CM

## 2020-09-11 LAB — CMP14+EGFR
ALT: 13 IU/L (ref 0–32)
AST: 20 IU/L (ref 0–40)
Albumin/Globulin Ratio: 1.3 (ref 1.2–2.2)
Albumin: 4.1 g/dL (ref 3.7–4.7)
Alkaline Phosphatase: 97 IU/L (ref 44–121)
BUN/Creatinine Ratio: 21 (ref 12–28)
BUN: 22 mg/dL (ref 8–27)
Bilirubin Total: 0.3 mg/dL (ref 0.0–1.2)
CO2: 22 mmol/L (ref 20–29)
Calcium: 9.4 mg/dL (ref 8.7–10.3)
Chloride: 106 mmol/L (ref 96–106)
Creatinine, Ser: 1.07 mg/dL — ABNORMAL HIGH (ref 0.57–1.00)
GFR calc Af Amer: 57 mL/min/{1.73_m2} — ABNORMAL LOW (ref >59)
GFR calc non Af Amer: 50 mL/min/{1.73_m2} — ABNORMAL LOW (ref >59)
Globulin, Total: 3.1 g/dL (ref 1.5–4.5)
Glucose: 102 mg/dL — ABNORMAL HIGH (ref 65–99)
Potassium: 4.2 mmol/L (ref 3.5–5.2)
Sodium: 141 mmol/L (ref 134–144)
Total Protein: 7.2 g/dL (ref 6.0–8.5)

## 2020-09-11 MED ORDER — DAPAGLIFLOZIN PROPANEDIOL 5 MG PO TABS: 5.0000 mg | ORAL_TABLET | Freq: Every day | ORAL | 1 refills | Status: DC

## 2020-09-11 NOTE — Progress Notes (Signed)
Subjective:    Patient ID: Cynthia Mays, female    DOB: January 31, 1941, 80 y.o.   MRN: [id number]  Chief Complaint  Patient presents with  . Diabetes    6 mth check up    Pt presents to the office today for chronic follow up.  She saw our clinical pharmacists for diabetic education since our last visit.  Diabetes She presents for her follow-up diabetic visit. She has type 2 diabetes mellitus. Her disease course has been stable. There are no hypoglycemic associated symptoms. Pertinent negatives for diabetes include no blurred vision, no fatigue and no foot paresthesias. Symptoms are stable. Pertinent negatives for diabetic complications include no CVA or peripheral neuropathy. Risk factors for coronary artery disease include dyslipidemia, diabetes mellitus, hypertension and sedentary lifestyle. She is following a generally healthy diet. Her overall blood glucose range is 110-130 mg/dl. An ACE inhibitor/angiotensin II receptor blocker is contraindicated.  Gastroesophageal Reflux She complains of belching and heartburn. She reports no hoarse voice. This is a chronic problem. The current episode started more than 1 year ago. Pertinent negatives include no fatigue. Risk factors include obesity. She has tried a PPI for the symptoms. The treatment provided moderate relief.  Hypertension This is a chronic problem. The current episode started more than 1 year ago. The problem is controlled. Pertinent negatives include no blurred vision, malaise/fatigue, peripheral edema or shortness of breath. Risk factors for coronary artery disease include dyslipidemia, diabetes mellitus, obesity and sedentary lifestyle. The current treatment provides moderate improvement. There is no history of CVA. Identifiable causes of hypertension include a thyroid problem.  Hyperlipidemia This is a chronic problem. The current episode started more than 1 year ago. Pertinent negatives include no shortness of breath. Current  antihyperlipidemic treatment includes statins. The current treatment provides moderate improvement of lipids. Risk factors for coronary artery disease include dyslipidemia, diabetes mellitus, hypertension, a sedentary lifestyle and post-menopausal.  Thyroid Problem Presents for follow-up visit. Patient reports no constipation, depressed mood, diarrhea, fatigue or hoarse voice. The symptoms have been stable. Her past medical history is significant for hyperlipidemia.      Review of Systems  Constitutional: Negative for fatigue and malaise/fatigue.  HENT: Negative for hoarse voice.   Eyes: Negative for blurred vision.  Respiratory: Negative for shortness of breath.   Gastrointestinal: Positive for heartburn. Negative for constipation and diarrhea.  All other systems reviewed and are negative.      Objective:   Physical Exam Vitals reviewed.  Constitutional:      General: She is not in acute distress.    Appearance: She is well-developed and well-nourished.  HENT:     Head: Normocephalic and atraumatic.     Right Ear: Tympanic membrane normal.     Left Ear: Tympanic membrane normal.     Mouth/Throat:     Mouth: Oropharynx is clear and moist.  Eyes:     Pupils: Pupils are equal, round, and reactive to light.  Neck:     Thyroid: No thyromegaly.  Cardiovascular:     Rate and Rhythm: Normal rate and regular rhythm.     Pulses: Intact distal pulses.     Heart sounds: Normal heart sounds. No murmur heard.   Pulmonary:     Effort: Pulmonary effort is normal. No respiratory distress.     Breath sounds: Normal breath sounds. No wheezing.  Abdominal:     General: Bowel sounds are normal. There is no distension.     Palpations: Abdomen is soft.  Tenderness: There is no abdominal tenderness.  Musculoskeletal:        General: No tenderness or edema. Normal range of motion.     Cervical back: Normal range of motion and neck supple.  Skin:    General: Skin is warm and dry.   Neurological:     Mental Status: She is alert and oriented to person, place, and time.     Cranial Nerves: No cranial nerve deficit.     Deep Tendon Reflexes: Reflexes are normal and symmetric.  Psychiatric:        Mood and Affect: Mood and affect normal.        Behavior: Behavior normal.        Thought Content: Thought content normal.        Judgment: Judgment normal.       BP 137/77   Pulse 71   Temp (!) 96.8 F (36 C) (Temporal)   Ht $R'4\' 11"'hp$  (1.499 m)   Wt 185 lb (83.9 kg)   BMI 37.37 kg/m      Assessment & Plan:  Cynthia Mays comes in today with chief complaint of Diabetes (6 mth check up)   Diagnosis and orders addressed:  1. Essential hypertension - CMP14+EGFR  2. Coronary artery disease due to lipid rich plaque - CMP14+EGFR  3. Gastroesophageal reflux disease without esophagitis - CMP14+EGFR  4. Hyperlipidemia associated with type 2 diabetes mellitus (HCC) - CMP14+EGFR  5. Type 2 diabetes mellitus without complication, without long-term current use of insulin (HCC) -Will start Farxiga 5 mg today Low carb diet - dapagliflozin propanediol (FARXIGA) 5 MG TABS tablet; Take 1 tablet (5 mg total) by mouth daily before breakfast.  Dispense: 90 tablet; Refill: 1 - CMP14+EGFR  6. Acquired hypothyroidism - CMP14+EGFR  7. Anxiety, generalized - CMP14+EGFR  8. Obesity, Class III, BMI 40-49.9 (morbid obesity) ([place]) - CMP14+EGFR   Labs pending Health Maintenance reviewed Diet and exercise encouraged  Follow up plan: 3 with new PCP   Evelina Dun, FNP

## 2020-09-11 NOTE — Patient Instructions (Signed)
Diabetes Mellitus and Nutrition, Adult When you have diabetes, or diabetes mellitus, it is very important to have healthy eating habits because your blood sugar (glucose) levels are greatly affected by what you eat and drink. Eating healthy foods in the right amounts, at about the same times every day, can help you:  Control your blood glucose.  Lower your risk of heart disease.  Improve your blood pressure.  Reach or maintain a healthy weight. What can affect my meal plan? Every person with diabetes is different, and each person has different needs for a meal plan. Your health care provider may recommend that you work with a dietitian to make a meal plan that is best for you. Your meal plan may vary depending on factors such as:  The calories you need.  The medicines you take.  Your weight.  Your blood glucose, blood pressure, and cholesterol levels.  Your activity level.  Other health conditions you have, such as heart or kidney disease. How do carbohydrates affect me? Carbohydrates, also called carbs, affect your blood glucose level more than any other type of food. Eating carbs naturally raises the amount of glucose in your blood. Carb counting is a method for keeping track of how many carbs you eat. Counting carbs is important to keep your blood glucose at a healthy level, especially if you use insulin or take certain oral diabetes medicines. It is important to know how many carbs you can safely have in each meal. This is different for every person. Your dietitian can help you calculate how many carbs you should have at each meal and for each snack. How does alcohol affect me? Alcohol can cause a sudden decrease in blood glucose (hypoglycemia), especially if you use insulin or take certain oral diabetes medicines. Hypoglycemia can be a life-threatening condition. Symptoms of hypoglycemia, such as sleepiness, dizziness, and confusion, are similar to symptoms of having too much  alcohol.  Do not drink alcohol if: ? Your health care provider tells you not to drink. ? You are pregnant, may be pregnant, or are planning to become pregnant.  If you drink alcohol: ? Do not drink on an empty stomach. ? Limit how much you use to:  0-1 drink a day for women.  0-2 drinks a day for men. ? Be aware of how much alcohol is in your drink. In the [place] one drink equals one 12 oz bottle of beer (355 mL), one 5 oz glass of wine (148 mL), or one 1 oz glass of hard liquor (44 mL). ? Keep yourself hydrated with water, diet soda, or unsweetened iced tea.  Keep in mind that regular soda, juice, and other mixers may contain a lot of sugar and must be counted as carbs. What are tips for following this plan? Reading food labels  Start by checking the serving size on the "Nutrition Facts" label of packaged foods and drinks. The amount of calories, carbs, fats, and other nutrients listed on the label is based on one serving of the item. Many items contain more than one serving per package.  Check the total grams (g) of carbs in one serving. You can calculate the number of servings of carbs in one serving by dividing the total carbs by 15. For example, if a food has 30 g of total carbs per serving, it would be equal to 2 servings of carbs.  Check the number of grams (g) of saturated fats and trans fats in one serving. Choose foods that have   a low amount or none of these fats.  Check the number of milligrams (mg) of salt (sodium) in one serving. Most people should limit total sodium intake to less than 2,300 mg per day.  Always check the nutrition information of foods labeled as "low-fat" or "nonfat." These foods may be higher in added sugar or refined carbs and should be avoided.  Talk to your dietitian to identify your daily goals for nutrients listed on the label. Shopping  Avoid buying canned, pre-made, or processed foods. These foods tend to be high in fat, sodium, and added  sugar.  Shop around the outside edge of the grocery store. This is where you will most often find fresh fruits and vegetables, bulk grains, fresh meats, and fresh dairy. Cooking  Use low-heat cooking methods, such as baking, instead of high-heat cooking methods like deep frying.  Cook using healthy oils, such as olive, canola, or sunflower oil.  Avoid cooking with butter, cream, or high-fat meats. Meal planning  Eat meals and snacks regularly, preferably at the same times every day. Avoid going long periods of time without eating.  Eat foods that are high in fiber, such as fresh fruits, vegetables, beans, and whole grains. Talk with your dietitian about how many servings of carbs you can eat at each meal.  Eat 4-6 oz (112-168 g) of lean protein each day, such as lean meat, chicken, fish, eggs, or tofu. One ounce (oz) of lean protein is equal to: ? 1 oz (28 g) of meat, chicken, or fish. ? 1 egg. ?  cup (62 g) of tofu.  Eat some foods each day that contain healthy fats, such as avocado, nuts, seeds, and fish.   What foods should I eat? Fruits Berries. Apples. Oranges. Peaches. Apricots. Plums. Grapes. Mango. Papaya. Pomegranate. Kiwi. Cherries. Vegetables Lettuce. Spinach. Leafy greens, including kale, chard, collard greens, and mustard greens. Beets. Cauliflower. Cabbage. Broccoli. Carrots. Green beans. Tomatoes. Peppers. Onions. Cucumbers. Brussels sprouts. Grains Whole grains, such as whole-wheat or whole-grain bread, crackers, tortillas, cereal, and pasta. Unsweetened oatmeal. Quinoa. Brown or wild rice. Meats and other proteins Seafood. Poultry without skin. Lean cuts of poultry and beef. Tofu. Nuts. Seeds. Dairy Low-fat or fat-free dairy products such as milk, yogurt, and cheese. The items listed above may not be a complete list of foods and beverages you can eat. Contact a dietitian for more information. What foods should I avoid? Fruits Fruits canned with  syrup. Vegetables Canned vegetables. Frozen vegetables with butter or cream sauce. Grains Refined white flour and flour products such as bread, pasta, snack foods, and cereals. Avoid all processed foods. Meats and other proteins Fatty cuts of meat. Poultry with skin. Breaded or fried meats. Processed meat. Avoid saturated fats. Dairy Full-fat yogurt, cheese, or milk. Beverages Sweetened drinks, such as soda or iced tea. The items listed above may not be a complete list of foods and beverages you should avoid. Contact a dietitian for more information. Questions to ask a health care provider  Do I need to meet with a diabetes educator?  Do I need to meet with a dietitian?  What number can I call if I have questions?  When are the best times to check my blood glucose? Where to find more information:  American Diabetes Association: diabetes.org  Academy of Nutrition and Dietetics: www.eatright.org  National Institute of Diabetes and Digestive and Kidney Diseases: www.niddk.nih.gov  Association of Diabetes Care and Education Specialists: www.diabeteseducator.org Summary  It is important to have healthy eating   habits because your blood sugar (glucose) levels are greatly affected by what you eat and drink.  A healthy meal plan will help you control your blood glucose and maintain a healthy lifestyle.  Your health care provider may recommend that you work with a dietitian to make a meal plan that is best for you.  Keep in mind that carbohydrates (carbs) and alcohol have immediate effects on your blood glucose levels. It is important to count carbs and to use alcohol carefully. This information is not intended to replace advice given to you by your health care provider. Make sure you discuss any questions you have with your health care provider. Document Revised: 07/26/2019 Document Reviewed: 07/26/2019 Elsevier Patient Education  2021 Elsevier Inc.  

## 2020-09-18 ENCOUNTER — Ambulatory Visit: Payer: Medicare Other | Admitting: Orthopaedic Surgery

## 2020-09-25 ENCOUNTER — Ambulatory Visit (INDEPENDENT_AMBULATORY_CARE_PROVIDER_SITE_OTHER): Payer: Medicare Other | Admitting: Orthopaedic Surgery

## 2020-09-25 ENCOUNTER — Other Ambulatory Visit: Payer: Self-pay

## 2020-09-25 ENCOUNTER — Encounter: Payer: Self-pay | Admitting: Orthopaedic Surgery

## 2020-09-25 VITALS — BP 159/90 | HR 68 | Ht 59.0 in | Wt 185.0 lb

## 2020-09-25 DIAGNOSIS — G8929 Other chronic pain: Secondary | ICD-10-CM

## 2020-09-25 DIAGNOSIS — M25511 Pain in right shoulder: Secondary | ICD-10-CM

## 2020-09-25 DIAGNOSIS — M542 Cervicalgia: Secondary | ICD-10-CM

## 2020-09-25 NOTE — Progress Notes (Signed)
Patient Cynthia Mays, female DOB:[birth year], 80 y.o. [id number]  Chief Complaint  Patient presents with  . Neck Pain    HPI  Cynthia Mays is a 80 y.o. female who has right shoulder Pain.  She has been going to OT.  I have reviewed the notes.  She is making slow progress.  Her motion is better and her pain is less.  The cold weather has caused some pain also.  She has no new trauma, no numbness.   Body mass index is 37.37 kg/m.  ROS  Review of Systems  Constitutional: Positive for activity change.  Musculoskeletal: Positive for arthralgias, neck pain and neck stiffness.  All other systems reviewed and are negative.   All other systems reviewed and are negative.  The following is a summary of the past history medically, past history surgically, known current medicines, social history and family history.  This information is gathered electronically by the computer from prior information and documentation.  I review this each visit and have found including this information at this point in the chart is beneficial and informative.    Past Medical History:  Diagnosis Date  . Complication of anesthesia   . Depression   . Diabetes mellitus   . Fibromyalgia   . GERD (gastroesophageal reflux disease)   . Hiatal hernia   . Hyperlipidemia   . Hypertension   . Hypothyroidism   . Hypothyroidism 07/23/2016  . PONV (postoperative nausea and vomiting)     Past Surgical History:  Procedure Laterality Date  . APPENDECTOMY  2002  . arthroscopy  right 2002, left 2007   bilateral knees  . BACK SURGERY  10/2007, 09/2008   x2  . BIOPSY  10/02/2016   Procedure: BIOPSY;  Surgeon: Malissa Hippo, MD;  Location: [facility];  Service: Endoscopy;;  gastric  . BIOPSY  05/04/2020   Procedure: BIOPSY;  Surgeon: Marguerita Merles, Reuel Boom, MD;  Location: [facility];  Service: Gastroenterology;;  . Fidela Salisbury RELEASE  1986   bilateral  . CESAREAN SECTION  1969, 1967   x2   . COLONOSCOPY WITH PROPOFOL N/A 05/04/2020   Procedure: COLONOSCOPY WITH PROPOFOL;  Surgeon: Dolores Frame, MD;  Location: [facility];  Service: Gastroenterology;  Laterality: N/A;  11  . ESOPHAGOGASTRODUODENOSCOPY N/A 10/02/2016   Procedure: ESOPHAGOGASTRODUODENOSCOPY (EGD);  Surgeon: Malissa Hippo, MD;  Location: [facility];  Service: Endoscopy;  Laterality: N/A;  11:15 - moved to 2/1 @ 3:00  . ESOPHAGOGASTRODUODENOSCOPY (EGD) WITH PROPOFOL N/A 05/04/2020   Procedure: ESOPHAGOGASTRODUODENOSCOPY (EGD) WITH PROPOFOL;  Surgeon: Dolores Frame, MD;  Location: [facility];  Service: Gastroenterology;  Laterality: N/A;  . KNEE SURGERY     both    Family History  Problem Relation Age of Onset  . Anesthesia problems Mother   . CAD Mother 27  . Cancer Mother        Pancreatic cancer  . Hypertension Father   . Suicidality Father        gunshot  . Deafness Sister   . Heart disease Brother   . Suicidality Son   . Cancer Sister        female   . Suicidality Brother   . Hypotension Neg Hx   . Pseudochol deficiency Neg Hx   . Malignant hyperthermia Neg Hx     Social History Social History   Tobacco Use  . Smoking status: Former Smoker    Packs/day: 1.00    Years: 43.00  Pack years: 43.00    Types: Cigarettes    Quit date: 05/02/1970    Years since quitting: 50.4  . Smokeless tobacco: Never Used  . Tobacco comment: > 20 years quit  Vaping Use  . Vaping Use: Never used  Substance Use Topics  . Alcohol use: No    Comment: occasionally wine  . Drug use: No    Allergies  Allergen Reactions  . Codeine Other (See Comments)    Headache, gi upset  . Penicillins Rash    Has patient had a PCN reaction causing immediate rash, facial/tongue/throat swelling, SOB or lightheadedness with hypotension: Yes Has patient had a PCN reaction causing severe rash involving mucus membranes or skin necrosis: No Has patient had a PCN reaction that required  hospitalization No Has patient had a PCN reaction occurring within the last 10 years: No If all of the above answers are "NO", then may proceed with Cephalosporin use.   . Pravastatin     Leg cramps    Current Outpatient Medications  Medication Sig Dispense Refill  . blood glucose meter kit and supplies Dispense based on patient and insurance preference. Use up to four times daily as directed. (FOR ICD-10 E10.9, E11.9). 1 each 0  . cholecalciferol (VITAMIN D3) 25 MCG (1000 UNIT) tablet Take 1,000 Units by mouth daily.     . clobetasol cream (TEMOVATE) 7.61 % APPLY 1 APPLICATION TOPICALLY ONCE DAILY AS NEEDED FOR  RASH 30 g 11  . dapagliflozin propanediol (FARXIGA) 5 MG TABS tablet Take 1 tablet (5 mg total) by mouth daily before breakfast. 90 tablet 1  . diazepam (VALIUM) 5 MG tablet Take 1 tablet (5 mg total) by mouth every 6 (six) hours as needed for anxiety or muscle spasms. 30 tablet 2  . famotidine (PEPCID) 40 MG tablet TAKE 1 TABLET BY MOUTH AT BEDTIME 90 tablet 0  . fexofenadine (ALLEGRA) 180 MG tablet Take 180 mg by mouth daily.    . fluticasone (FLONASE) 50 MCG/ACT nasal spray Place 2 sprays into the nose daily.    Marland Kitchen HYDROcodone-acetaminophen (NORCO/VICODIN) 5-325 MG tablet Take 1 tablet by mouth every 6 (six) hours as needed for moderate pain. 30 tablet 0  . levothyroxine (EUTHYROX) 112 MCG tablet TAKE 1 TABLET BY MOUTH ONCE DAILY BEFORE BREAKFAST 90 tablet 1  . mesalamine (LIALDA) 1.2 g EC tablet Take 2 tablets (2.4 g total) by mouth daily with breakfast. 60 tablet 2  . metFORMIN (GLUCOPHAGE) 500 MG tablet Take 1 tablet (500 mg total) by mouth 2 (two) times daily. (Needs to be seen before next refill) (Patient taking differently: Take 500 mg by mouth 2 (two) times daily.) 60 tablet 0  . Multiple Vitamins-Minerals (MULTIVITAMINS THER. W/MINERALS) TABS Take 1 tablet by mouth daily.    Marland Kitchen NIFEdipine (PROCARDIA-XL/NIFEDICAL-XL) 30 MG 24 hr tablet Take 1 tablet (30 mg total) by mouth  daily. 90 tablet 1  . ondansetron (ZOFRAN ODT) 8 MG disintegrating tablet Take 1 tablet (8 mg total) by mouth every 8 (eight) hours as needed for nausea or vomiting. 20 tablet 0  . pantoprazole (PROTONIX) 40 MG tablet Take 40 mg by mouth 2 (two) times daily.    . Probiotic Product ([place]) CAPS Take 1 capsule by mouth daily.     . rosuvastatin (CRESTOR) 5 MG tablet Take 1 tablet by mouth once daily 90 tablet 0  . sertraline (ZOLOFT) 50 MG tablet TAKE 1 & 1/2 (ONE & ONE-HALF) TABLETS BY MOUTH ONCE DAILY 135 tablet  0   No current facility-administered medications for this visit.     Physical Exam  Blood pressure (!) 159/90, pulse 68, height $RemoveBe'4\' 11"'etuCHOKlp$  (1.499 m), weight 185 lb (83.9 kg).  Constitutional: overall normal hygiene, normal nutrition, well developed, normal grooming, normal body habitus. Assistive device:none  Musculoskeletal: gait and station Limp none, muscle tone and strength are normal, no tremors or atrophy is present.  .  Neurological: coordination overall normal.  Deep tendon reflex/nerve stretch intact.  Sensation normal.  Cranial nerves II-XII intact.   Skin:   Normal overall no scars, lesions, ulcers or rashes. No psoriasis.  Psychiatric: Alert and oriented x 3.  Recent memory intact, remote memory unclear.  Normal mood and affect. Well groomed.  Good eye contact.  Cardiovascular: overall no swelling, no varicosities, no edema bilaterally, normal temperatures of the legs and arms, no clubbing, cyanosis and good capillary refill.  Lymphatic: palpation is normal.  Motion of the right shoulder is better.  She can forward flex to 130 and abduct to 120.  NV intact.  ROM of neck is full.  All other systems reviewed and are negative   The patient has been educated about the nature of the problem(s) and counseled on treatment options.  The patient appeared to understand what I have discussed and is in agreement with it.  Encounter Diagnoses  Name Primary?  .  Chronic right shoulder pain Yes  . Neck pain     PLAN Call if any problems.  Precautions discussed.  Continue current medications.   Return to clinic 3 weeks   Continue OT.  Electronically Signed Sanjuana Kava, MD 1/25/20221:52 PM

## 2020-10-16 ENCOUNTER — Ambulatory Visit: Payer: Medicare Other | Admitting: Orthopaedic Surgery

## 2020-11-27 ENCOUNTER — Other Ambulatory Visit (INDEPENDENT_AMBULATORY_CARE_PROVIDER_SITE_OTHER): Payer: Self-pay | Admitting: Internal Medicine

## 2020-11-27 ENCOUNTER — Other Ambulatory Visit: Payer: Self-pay | Admitting: Family

## 2020-11-27 DIAGNOSIS — R10826 Epigastric rebound abdominal tenderness: Secondary | ICD-10-CM

## 2020-11-27 DIAGNOSIS — K2101 Gastro-esophageal reflux disease with esophagitis, with bleeding: Secondary | ICD-10-CM

## 2020-11-27 DIAGNOSIS — E1169 Type 2 diabetes mellitus with other specified complication: Secondary | ICD-10-CM

## 2020-11-27 DIAGNOSIS — Z7689 Persons encountering health services in other specified circumstances: Secondary | ICD-10-CM

## 2020-11-28 ENCOUNTER — Other Ambulatory Visit: Payer: Self-pay

## 2020-11-29 ENCOUNTER — Ambulatory Visit (INDEPENDENT_AMBULATORY_CARE_PROVIDER_SITE_OTHER): Payer: 59

## 2020-11-29 VITALS — Ht 59.0 in | Wt 179.0 lb

## 2020-11-29 DIAGNOSIS — Z0000 Encounter for general adult medical examination without abnormal findings: Secondary | ICD-10-CM | POA: Diagnosis not present

## 2020-11-29 NOTE — Progress Notes (Addendum)
Subjective:   Cynthia Mays is a 80 y.o. female who presents for Medicare Annual (Subsequent) preventive examination.  Virtual Visit via Telephone Note  I connected with  Cynthia Mays on 11/29/20 at  9:45 AM EDT by telephone and verified that I am speaking with the correct person using two identifiers.  Location: Patient: Home  Provider: [facility] Persons participating in the virtual visit: patient/Nurse Health Advisor   I discussed the limitations, risks, security and privacy concerns of performing an evaluation and management service by telephone and the availability of in person appointments. The patient expressed understanding and agreed to proceed.  Interactive audio and video telecommunications were attempted between this nurse and patient, however failed, due to patient having technical difficulties OR patient did not have access to video capability.  We continued and completed visit with audio only.  Some vital signs may be absent or patient reported.    E , LPN   Review of Systems     Cardiac Risk Factors include: advanced age (>76mn, >>4women);obesity (BMI >30kg/m2);diabetes mellitus;dyslipidemia;hypertension     Objective:    Today's Vitals   11/29/20 0952  Weight: 179 lb (81.2 kg)  Height: 4' 11" (1.499 m)   Body mass index is 36.15 kg/m.  Advanced Directives 11/29/2020 07/05/2020 05/04/2020 05/02/2020 02/26/2020 02/25/2020 02/23/2020  Does Patient Have a Medical Advance Directive? Yes Yes _0   Type of [place] - - - - - -  Copy of [place] Chart? No - copy requested - - - - - -  Would patient like information on creating a medical advance directive? - - No - Patient declined No - Patient declined No - Patient declined No - Patient declined -  Pre-existing out of facility DNR order (yellow form or pink MOST form) - - - - - - -    Current Medications  (verified) Outpatient Encounter Medications as of 11/29/2020  Medication Sig  . Accu-Chek Softclix Lancets lancets USE TO CHECK BLOOD SUGAR UP TO 4 TIMES A DAY AS DIRECTED  . benzonatate (TESSALON) 100 MG capsule Take 200 mg by mouth 3 (three) times daily as needed.  . blood glucose meter kit and supplies Dispense based on patient and insurance preference. Use up to four times daily as directed. (FOR ICD-10 E10.9, E11.9).  . cholecalciferol (VITAMIN D3) 25 MCG (1000 UNIT) tablet Take 1,000 Units by mouth daily.   . clindamycin (CLEOCIN) 300 MG capsule Take 300 mg by mouth 3 (three) times daily.  . clobetasol cream (TEMOVATE) 02.83% APPLY 1 APPLICATION TOPICALLY ONCE DAILY AS NEEDED FOR  RASH  . dapagliflozin propanediol (FARXIGA) 5 MG TABS tablet Take 1 tablet (5 mg total) by mouth daily before breakfast.  . diazepam (VALIUM) 5 MG tablet Take 1 tablet (5 mg total) by mouth every 6 (six) hours as needed for anxiety or muscle spasms.  . famotidine (PEPCID) 40 MG tablet TAKE 1 TABLET BY MOUTH AT BEDTIME  . fexofenadine (ALLEGRA) 180 MG tablet Take 180 mg by mouth daily.  . fluticasone (FLONASE) 50 MCG/ACT nasal spray Place 2 sprays into the nose daily.  .Marland KitchenHYDROcodone-acetaminophen (NORCO/VICODIN) 5-325 MG tablet Take 1 tablet by mouth every 6 (six) hours as needed for moderate pain.  .Marland Kitchenlevothyroxine (EUTHYROX) 112 MCG tablet TAKE 1 TABLET BY MOUTH ONCE DAILY BEFORE BREAKFAST  . mesalamine (LIALDA) 1.2 g EC tablet Take 2 tablets (2.4 g total) by mouth  daily with breakfast.  . Multiple Vitamins-Minerals (MULTIVITAMINS THER. W/MINERALS) TABS Take 1 tablet by mouth daily.  Marland Kitchen NIFEdipine (PROCARDIA-XL/NIFEDICAL-XL) 30 MG 24 hr tablet Take 1 tablet (30 mg total) by mouth daily.  . ondansetron (ZOFRAN ODT) 8 MG disintegrating tablet Take 1 tablet (8 mg total) by mouth every 8 (eight) hours as needed for nausea or vomiting.  . pantoprazole (PROTONIX) 40 MG tablet Take 1 tablet by mouth twice daily  .  Probiotic Product ([place]) CAPS Take 1 capsule by mouth daily.   . rosuvastatin (CRESTOR) 5 MG tablet Take 1 tablet by mouth once daily  . sertraline (ZOLOFT) 50 MG tablet TAKE 1 & 1/2 (ONE & ONE-HALF) TABLETS BY MOUTH ONCE DAILY  . tiZANidine (ZANAFLEX) 4 MG tablet Take 4 mg by mouth every 8 (eight) hours as needed.  . metFORMIN (GLUCOPHAGE) 500 MG tablet Take 1 tablet (500 mg total) by mouth 2 (two) times daily. (Needs to be seen before next refill) (Patient not taking: Reported on 11/29/2020)  . predniSONE (DELTASONE) 5 MG tablet Take by mouth. (Patient not taking: Reported on 11/29/2020)   No facility-administered encounter medications on file as of 11/29/2020.    Allergies (verified) Codeine, Penicillins, and Pravastatin   History: Past Medical History:  Diagnosis Date  . Complication of anesthesia   . Depression   . Diabetes mellitus   . Fibromyalgia   . GERD (gastroesophageal reflux disease)   . Hiatal hernia   . Hyperlipidemia   . Hypertension   . Hypothyroidism   . Hypothyroidism 07/23/2016  . PONV (postoperative nausea and vomiting)    Past Surgical History:  Procedure Laterality Date  . APPENDECTOMY  2002  . arthroscopy  right 2002, left 2007   bilateral knees  . BACK SURGERY  10/2007, 09/2008   x2  . BIOPSY  10/02/2016   Procedure: BIOPSY;  Surgeon: Rogene Houston, MD;  [facility];  Service: Endoscopy;;  gastric  . BIOPSY  05/04/2020   Procedure: BIOPSY;  Surgeon: Montez Morita, Quillian Quince, MD;  [facility];  Service: Gastroenterology;;  . [place]   bilateral  . [place]   x2  . COLONOSCOPY WITH PROPOFOL N/A 05/04/2020   Procedure: COLONOSCOPY WITH PROPOFOL;  Surgeon: Harvel Quale, MD;  [facility];  Service: Gastroenterology;  Laterality: N/A;  11  . ESOPHAGOGASTRODUODENOSCOPY N/A 10/02/2016   Procedure: ESOPHAGOGASTRODUODENOSCOPY (EGD);  Surgeon: Rogene Houston,  MD;  [facility];  Service: Endoscopy;  Laterality: N/A;  11:15 - moved to 2/1 @ 3:00  . ESOPHAGOGASTRODUODENOSCOPY (EGD) WITH PROPOFOL N/A 05/04/2020   Procedure: ESOPHAGOGASTRODUODENOSCOPY (EGD) WITH PROPOFOL;  Surgeon: Harvel Quale, MD;  [facility];  Service: Gastroenterology;  Laterality: N/A;  . KNEE SURGERY     both   Family History  Problem Relation Age of Onset  . Anesthesia problems Mother   . CAD Mother 10  . Cancer Mother        Pancreatic cancer  . Hypertension Father   . Suicidality Father        gunshot  . Deafness Sister   . Heart disease Brother   . Suicidality Son   . Cancer Sister        female   . Suicidality Brother   . Hypotension Neg Hx   . Pseudochol deficiency Neg Hx   . Malignant hyperthermia Neg Hx    Social History   Socioeconomic  History  . Marital status: Widowed    Spouse name: Not on file  . Number of children: 4  . Years of education: 53  . Highest education level: 12th grade  Occupational History    Employer: RETIRED  Tobacco Use  . Smoking status: Former Smoker    Packs/day: 1.00    Years: 43.00    Pack years: 43.00    Types: Cigarettes    Quit date: 05/02/1970    Years since quitting: 50.6  . Smokeless tobacco: Never Used  . Tobacco comment: > 20 years quit  Vaping Use  . Vaping Use: Never used  Substance and Sexual Activity  . Alcohol use: No    Comment: occasionally wine  . Drug use: No  . Sexual activity: Yes    Birth control/protection: Post-menopausal  Other Topics Concern  . Not on file  Social History Narrative   Lives alone.  Widowed    1 dog, 2 cats   2 daughters, 1 local   Enjoys dancing and sewing   Social Determinants of Health   Financial Resource Strain: Low Risk   . Difficulty of Paying Living Expenses: Not hard at all  Food Insecurity: No Food Insecurity  . Worried About Charity fundraiser in the Last Year: Never true  . Ran Out of Food in the Last Year: Never true   Transportation Needs: No Transportation Needs  . Lack of Transportation (Medical): No  . Lack of Transportation (Non-Medical): No  Physical Activity: Insufficiently Active  . Days of Exercise per Week: 7 days  . Minutes of Exercise per Session: 20 min  Stress: No Stress Concern Present  . Feeling of Stress : Not at all  Social Connections: Moderately Integrated  . Frequency of Communication with Friends and Family: More than three times a week  . Frequency of Social Gatherings with Friends and Family: Once a week  . Attends Religious Services: More than 4 times per year  . Active Member of Clubs or Organizations: Yes  . Attends Archivist Meetings: More than 4 times per year  . Marital Status: Widowed    Tobacco Counseling Counseling given: Not Answered Comment: > 20 years quit   Clinical Intake:  Pre-visit preparation completed: Yes  Pain : 0-10 Pain Type: Chronic pain Pain Location: Shoulder Pain Orientation: Right Pain Descriptors / Indicators: Aching Pain Onset: More than a month ago Pain Frequency: Intermittent     BMI - recorded: 36.15 Nutritional Status: BMI > 30  Obese Nutritional Risks: None Diabetes: Yes CBG done?: No Did pt. bring in CBG monitor from home?: No  How often do you need to have someone help you when you read instructions, pamphlets, or other written materials from your doctor or pharmacy?: 1 - Never  Nutrition Risk Assessment:  Has the patient had any N/V/D within the last 2 months?  no Does the patient have any non-healing wounds?  No  Has the patient had any unintentional weight loss or weight gain?  No   Diabetes:  Is the patient diabetic?  Yes  If diabetic, was a CBG obtained today?  No  Did the patient bring in their glucometer from home?  No  How often do you monitor your CBG's? Doesn't monitor regularly unless she feels bad.   Financial Strains and Diabetes Management:  Are you having any financial strains with the  device, your supplies or your medication? No .  Does the patient want to be seen by Chronic Care  Management for management of their diabetes?  No  Would the patient like to be referred to a Nutritionist or for Diabetic Management?  No   Diabetic Exams:  Diabetic Eye Exam: Completed 02/18/2019 - positive retinopathy. Overdue for diabetic eye exam. Pt has been advised about the importance in completing this exam. A referral has been placed today. Message sent to referral coordinator for scheduling purposes. Advised pt to expect a call from office referred to regarding appt.  Diabetic Foot Exam: Completed 08/23/20. Pt has been advised about the importance in completing this exam. Pt is scheduled for diabetic foot exam on 12/10/20.    Interpreter Needed?: No  Information entered by :: Arelyn Gauer, LPN   Activities of Daily Living In your present state of health, do you have any difficulty performing the following activities: 11/29/2020 05/02/2020  Hearing? N Y  Comment - decreased right  Vision? N N  Difficulty concentrating or making decisions? N N  Walking or climbing stairs? N Y  Comment - right knee gives out on me sometimes  Dressing or bathing? N N  Doing errands, shopping? N N  Preparing Food and eating ? N -  Using the Toilet? N -  In the past six months, have you accidently leaked urine? Y -  Do you have problems with loss of bowel control? N -  Comment wears depends for protection -  Managing your Medications? N -  Managing your Finances? N -  Housekeeping or managing your Housekeeping? N -  Some recent data might be hidden    Patient Care Team: Ivy Lynn, NP as PCP - General (Nurse Practitioner) Lavera Guise, [facility] as Pharmacist (Family Medicine) Verita Crumb, MD as Consulting Physician (Orthopedic Surgery)  Indicate any recent Medical Services you may have received from other than [facility] providers in the past year (date may be approximate).     Assessment:    This is a routine wellness examination for Cynthia Mays.  Hearing/Vision screen  Hearing Screening   125Hz 250Hz 500Hz 1000Hz 2000Hz 3000Hz 4000Hz 6000Hz 8000Hz  Right ear:           Left ear:           Comments: No complaints of hearing loss  Vision Screening Comments: Annual visits with [facility] in [place], [place] - last visit 08/2019  Dietary issues and exercise activities discussed: Current Exercise Habits: Home exercise routine, Type of exercise: stretching;walking, Time (Minutes): 30, Frequency (Times/Week): 7, Weekly Exercise (Minutes/Week): 210, Intensity: Mild, Exercise limited by: orthopedic condition(s)  Goals    . Exercise 3x per week (30 min per time)     Increase shoulder strength/ROM and reduce pain - Get more active    . Patient Stated     11/29/2019 AWV Goal: Fall Prevention  . Over the next year, patient will decrease their risk for falls by: o Using assistive devices, such as a cane or walker, as needed o Identifying fall risks within their home and correcting them by: - Removing throw rugs - Adding handrails to stairs or ramps - Removing clutter and keeping a clear pathway throughout the home - Increasing light, especially at night - Adding shower handles/bars - Raising toilet seat o Identifying potential personal risk factors for falls: - Medication side effects - Incontinence/urgency - Vestibular dysfunction - Hearing loss - Musculoskeletal disorders - Neurological disorders - Orthostatic hypotension  11/29/2019 AWV Goal: Exercise for General Health   Patient will verbalize understanding of the benefits of increased physical activity:  Exercising regularly is important. It will improve your overall fitness, flexibility, and endurance.  Regular exercise also will improve your overall health. It can help you control your weight, reduce stress, and improve your bone density.  Over the next year, patient will increase physical activity as tolerated with a  goal of at least 150 minutes of moderate physical activity per week.   You can tell that you are exercising at a moderate intensity if your heart starts beating faster and you start breathing faster but can still hold a conversation.  Moderate-intensity exercise ideas include:  Walking 1 mile (1.6 km) in about 15 minutes  Biking  Hiking  Golfing  Dancing  Water aerobics  Patient will verbalize understanding of everyday activities that increase physical activity by providing examples like the following: ? Yard work, such as: ? Pushing a Conservation officer, nature ? Raking and bagging leaves ? Washing your car ? Pushing a stroller ? Shoveling snow ? Gardening ? Washing windows or floors  Patient will be able to explain general safety guidelines for exercising:   Before you start a new exercise program, talk with your health care provider.  Do not exercise so much that you hurt yourself, feel dizzy, or get very short of breath.  Wear comfortable clothes and wear shoes with good support.  Drink plenty of water while you exercise to prevent dehydration or heat stroke.  Work out until your breathing and your heartbeat get faster.       Depression Screen PHQ 2/9 Scores 11/29/2020 09/11/2020 08/23/2020 05/24/2020 05/10/2020 03/08/2020 02/03/2020  PHQ - 2 Score 0 0 0 0 0 0 0  PHQ- 9 Score - - - - - - 3    Fall Risk Fall Risk  11/29/2020 09/11/2020 08/23/2020 05/24/2020 03/08/2020  Falls in the past year? _0 0 0  Number falls in past yr: 0 0 1 - -  Injury with Fall? _1 - -  Comment fractured finger on left hand and hurt shoulder and neck - - - -  Risk for fall due to : History of fall(s) Impaired balance/gait Other (Comment) - -  Risk for fall due to: Comment Her BP dropped - - - -  Follow up Falls prevention discussed - Falls evaluation completed - -    FALL RISK PREVENTION PERTAINING TO THE HOME:  Any stairs in or around the home? yes If so, are there any without handrails? No  Home  free of loose throw rugs in walkways, pet beds, electrical cords, etc? Yes  Adequate lighting in your home to reduce risk of falls? Yes   ASSISTIVE DEVICES UTILIZED TO PREVENT FALLS:  Life alert? No  Use of a cane, walker or w/c? Yes  Grab bars in the bathroom? Yes  Shower chair or bench in shower? No  Elevated toilet seat or a handicapped toilet? Yes   TIMED UP AND GO:  Was the test performed? No . Telephonic visit.  Cognitive Function:     6CIT Screen 11/29/2019  What Year? 0 points  What month? 0 points  What time? 0 points  Count back from 20 0 points  Months in reverse 2 points  Repeat phrase 4 points  Total Score 6    Immunizations Immunization History  Administered Date(s) Administered  . Fluad Quad(high Dose 65+) 05/24/2020  . Influenza, High Dose Seasonal PF 06/13/2013, 05/03/2019  . Moderna Sars-Covid-2 Vaccination 09/13/2019, 10/14/2019, 08/29/2020  . Pneumococcal Conjugate-13 10/01/2015  . Pneumococcal Polysaccharide-23 10/01/2007  .  Pneumococcal-Unspecified 06/05/2017  . Td,absorbed, Preservative Free, Adult Use, Lf Unspecified 07/17/2000  . Tdap 07/31/2014    TDAP status: Up to date  Flu Vaccine status: Up to date  Pneumococcal vaccine status: Up to date  Covid-19 vaccine status: Completed vaccines  Qualifies for Shingles Vaccine? Yes   Zostavax completed No   Shingrix Completed?: No.    Education has been provided regarding the importance of this vaccine. Patient has been advised to call insurance company to determine out of pocket expense if they have not yet received this vaccine. Advised may also receive vaccine at local pharmacy or Health Dept. Verbalized acceptance and understanding.  Screening Tests Health Maintenance  Topic Date Due  . OPHTHALMOLOGY EXAM  02/18/2020  . URINE MICROALBUMIN  02/02/2021  . HEMOGLOBIN A1C  02/21/2021  . FOOT EXAM  08/23/2021  . TETANUS/TDAP  07/31/2024  . INFLUENZA VACCINE  Completed  . DEXA SCAN   Completed  . COVID-19 Vaccine  Completed  . Hepatitis C Screening  Completed  . PNA vac Low Risk Adult  Completed  . HPV VACCINES  Aged Out    Health Maintenance  Health Maintenance Due  Topic Date Due  . OPHTHALMOLOGY EXAM  02/18/2020    Colorectal cancer screening: No longer required.   Mammogram status: No longer required due to age.  Bone Density status: Completed 07/21/2016. Results reflect: Bone density results: OSTEOPENIA. Repeat every 2 years. Patient declines appointment.  Lung Cancer Screening: (Low Dose CT Chest recommended if Age 73-80 years, 30 pack-year currently smoking OR have quit w/in 15years.) does not qualify.   Additional Screening:  Hepatitis C Screening: does not qualify; Completed 08/23/2020  Vision Screening: Recommended annual ophthalmology exams for early detection of glaucoma and other disorders of the eye. Is the patient up to date with their annual eye exam?  No  Who is the provider or what is the name of the office in which the patient attends annual eye exams? [facility] If pt is not established with a provider, would they like to be referred to a provider to establish care? No .   Dental Screening: Recommended annual dental exams for proper oral hygiene  Community Resource Referral / Chronic Care Management: CRR required this visit?  No   CCM required this visit?  No      Plan:     I have personally reviewed and noted the following in the patient's chart:   . Medical and social history . Use of alcohol, tobacco or illicit drugs  . Current medications and supplements . Functional ability and status . Nutritional status . Physical activity . Advanced directives . List of other physicians . Hospitalizations, surgeries, and ER visits in previous 12 months . Vitals . Screenings to include cognitive, depression, and falls . Referrals and appointments  In addition, I have reviewed and discussed with patient certain preventive  protocols, quality metrics, and best practice recommendations. A written personalized care plan for preventive services as well as general preventive health recommendations were provided to patient.     Sandrea Hammond, LPN   02/18/3558   Nurse Notes: Patient has appointment with [facility]; will check with pharmacy regarding shingrix vaccine.

## 2020-11-29 NOTE — Patient Instructions (Addendum)
Cynthia Mays , Thank you for taking time to come for your Medicare Wellness Visit. I appreciate your ongoing commitment to your health goals. Please review the following plan we discussed and let me know if I can assist you in the future.   Screening recommendations/referrals: Colonoscopy: Done 05/04/20 - No longer required due to age Mammogram: No longer required Bone Density: Done 07/21/2016 - No longer required Recommended yearly ophthalmology/optometry visit for glaucoma screening and checkup Recommended yearly dental visit for hygiene and checkup  Vaccinations: Influenza vaccine: Done 05/24/2020 Pneumococcal vaccine: Done 10/01/15 & 06/05/17 Tdap vaccine: Done 07/31/2014 - Repeat in 2025 Shingles vaccine: Shingrix discussed. Please contact your pharmacy for coverage information.    Covid-19: Complete: 09/13/19, 10/14/19, & 08/29/20  Advanced directives: Please bring a copy of your health care power of attorney and living will to the office to be added to your chart at your convenience.  Conditions/risks identified: Continue working on balance and strength - increase physical activity and water intake.  Next appointment: Follow up in one year for your annual wellness visit    Preventive Care 65 Years and Older, Female Preventive care refers to lifestyle choices and visits with your health care provider that can promote health and wellness. What does preventive care include?  A yearly physical exam. This is also called an annual well check.  Dental exams once or twice a year.  Routine eye exams. Ask your health care provider how often you should have your eyes checked.  Personal lifestyle choices, including:  Daily care of your teeth and gums.  Regular physical activity.  Eating a healthy diet.  Avoiding tobacco and drug use.  Limiting alcohol use.  Practicing safe sex.  Taking low-dose aspirin every day.  Taking vitamin and mineral supplements as recommended by your  health care provider. What happens during an annual well check? The services and screenings done by your health care provider during your annual well check will depend on your age, overall health, lifestyle risk factors, and family history of disease. Counseling  Your health care provider may ask you questions about your:  Alcohol use.  Tobacco use.  Drug use.  Emotional well-being.  Home and relationship well-being.  Sexual activity.  Eating habits.  History of falls.  Memory and ability to understand (cognition).  Work and work Astronomer.  Reproductive health. Screening  You may have the following tests or measurements:  Height, weight, and BMI.  Blood pressure.  Lipid and cholesterol levels. These may be checked every 5 years, or more frequently if you are over 9 years old.  Skin check.  Lung cancer screening. You may have this screening every year starting at age 30 if you have a 30-pack-year history of smoking and currently smoke or have quit within the past 15 years.  Fecal occult blood test (FOBT) of the stool. You may have this test every year starting at age 79.  Flexible sigmoidoscopy or colonoscopy. You may have a sigmoidoscopy every 5 years or a colonoscopy every 10 years starting at age 59.  Hepatitis C blood test.  Hepatitis B blood test.  Sexually transmitted disease (STD) testing.  Diabetes screening. This is done by checking your blood sugar (glucose) after you have not eaten for a while (fasting). You may have this done every 1-3 years.  Bone density scan. This is done to screen for osteoporosis. You may have this done starting at age 51.  Mammogram. This may be done every 1-2 years. Talk to your  health care provider about how often you should have regular mammograms. Talk with your health care provider about your test results, treatment options, and if necessary, the need for more tests. Vaccines  Your health care provider may recommend  certain vaccines, such as:  Influenza vaccine. This is recommended every year.  Tetanus, diphtheria, and acellular pertussis (Tdap, Td) vaccine. You may need a Td booster every 10 years.  Zoster vaccine. You may need this after age 51.  Pneumococcal 13-valent conjugate (PCV13) vaccine. One dose is recommended after age 78.  Pneumococcal polysaccharide (PPSV23) vaccine. One dose is recommended after age 28. Talk to your health care provider about which screenings and vaccines you need and how often you need them. This information is not intended to replace advice given to you by your health care provider. Make sure you discuss any questions you have with your health care provider. Document Released: 09/14/2015 Document Revised: 05/07/2016 Document Reviewed: 06/19/2015 Elsevier Interactive Patient Education  2017 [place] Prevention in the Home Falls can cause injuries. They can happen to people of all ages. There are many things you can do to make your home safe and to help prevent falls. What can I do on the outside of my home?  Regularly fix the edges of walkways and driveways and fix any cracks.  Remove anything that might make you trip as you walk through a door, such as a raised step or threshold.  Trim any bushes or trees on the path to your home.  Use bright outdoor lighting.  Clear any walking paths of anything that might make someone trip, such as rocks or tools.  Regularly check to see if handrails are loose or broken. Make sure that both sides of any steps have handrails.  Any raised decks and porches should have guardrails on the edges.  Have any leaves, snow, or ice cleared regularly.  Use sand or salt on walking paths during winter.  Clean up any spills in your garage right away. This includes oil or grease spills. What can I do in the bathroom?  Use night lights.  Install grab bars by the toilet and in the tub and shower. Do not use towel bars as  grab bars.  Use non-skid mats or decals in the tub or shower.  If you need to sit down in the shower, use a plastic, non-slip stool.  Keep the floor dry. Clean up any water that spills on the floor as soon as it happens.  Remove soap buildup in the tub or shower regularly.  Attach bath mats securely with double-sided non-slip rug tape.  Do not have throw rugs and other things on the floor that can make you trip. What can I do in the bedroom?  Use night lights.  Make sure that you have a light by your bed that is easy to reach.  Do not use any sheets or blankets that are too big for your bed. They should not hang down onto the floor.  Have a firm chair that has side arms. You can use this for support while you get dressed.  Do not have throw rugs and other things on the floor that can make you trip. What can I do in the kitchen?  Clean up any spills right away.  Avoid walking on wet floors.  Keep items that you use a lot in easy-to-reach places.  If you need to reach something above you, use a strong step stool that has a  grab bar.  Keep electrical cords out of the way.  Do not use floor polish or wax that makes floors slippery. If you must use wax, use non-skid floor wax.  Do not have throw rugs and other things on the floor that can make you trip. What can I do with my stairs?  Do not leave any items on the stairs.  Make sure that there are handrails on both sides of the stairs and use them. Fix handrails that are broken or loose. Make sure that handrails are as long as the stairways.  Check any carpeting to make sure that it is firmly attached to the stairs. Fix any carpet that is loose or worn.  Avoid having throw rugs at the top or bottom of the stairs. If you do have throw rugs, attach them to the floor with carpet tape.  Make sure that you have a light switch at the top of the stairs and the bottom of the stairs. If you do not have them, ask someone to add them  for you. What else can I do to help prevent falls?  Wear shoes that:  Do not have high heels.  Have rubber bottoms.  Are comfortable and fit you well.  Are closed at the toe. Do not wear sandals.  If you use a stepladder:  Make sure that it is fully opened. Do not climb a closed stepladder.  Make sure that both sides of the stepladder are locked into place.  Ask someone to hold it for you, if possible.  Clearly mark and make sure that you can see:  Any grab bars or handrails.  First and last steps.  Where the edge of each step is.  Use tools that help you move around (mobility aids) if they are needed. These include:  Canes.  Walkers.  Scooters.  Crutches.  Turn on the lights when you go into a dark area. Replace any light bulbs as soon as they burn out.  Set up your furniture so you have a clear path. Avoid moving your furniture around.  If any of your floors are uneven, fix them.  If there are any pets around you, be aware of where they are.  Review your medicines with your doctor. Some medicines can make you feel dizzy. This can increase your chance of falling. Ask your doctor what other things that you can do to help prevent falls. This information is not intended to replace advice given to you by your health care provider. Make sure you discuss any questions you have with your health care provider. Document Released: 06/14/2009 Document Revised: 01/24/2016 Document Reviewed: 09/22/2014 Elsevier Interactive Patient Education  2017 [employer].

## 2020-11-30 ENCOUNTER — Other Ambulatory Visit (INDEPENDENT_AMBULATORY_CARE_PROVIDER_SITE_OTHER): Payer: Self-pay | Admitting: Gastroenterology

## 2020-11-30 ENCOUNTER — Other Ambulatory Visit: Payer: Self-pay | Admitting: Family

## 2020-11-30 DIAGNOSIS — K529 Noninfective gastroenteritis and colitis, unspecified: Secondary | ICD-10-CM

## 2020-11-30 DIAGNOSIS — K633 Ulcer of intestine: Secondary | ICD-10-CM

## 2020-12-03 NOTE — Telephone Encounter (Signed)
Last seen 08/13/2020 for Colonic Ulcer Dr. Levon Hedger

## 2020-12-10 ENCOUNTER — Ambulatory Visit: Payer: Medicare Other | Admitting: Nurse Practitioner

## 2020-12-17 ENCOUNTER — Other Ambulatory Visit: Payer: Self-pay

## 2020-12-17 ENCOUNTER — Ambulatory Visit (INDEPENDENT_AMBULATORY_CARE_PROVIDER_SITE_OTHER): Payer: 59 | Admitting: Nurse Practitioner

## 2020-12-17 DIAGNOSIS — E039 Hypothyroidism, unspecified: Secondary | ICD-10-CM

## 2020-12-17 DIAGNOSIS — G8929 Other chronic pain: Secondary | ICD-10-CM

## 2020-12-17 DIAGNOSIS — M5440 Lumbago with sciatica, unspecified side: Secondary | ICD-10-CM

## 2020-12-17 DIAGNOSIS — I10 Essential (primary) hypertension: Secondary | ICD-10-CM

## 2020-12-17 DIAGNOSIS — E119 Type 2 diabetes mellitus without complications: Secondary | ICD-10-CM | POA: Diagnosis not present

## 2020-12-17 LAB — BAYER DCA HB A1C WAIVED: HB A1C (BAYER DCA - WAIVED): 6.9 % (ref <7.0)

## 2020-12-17 NOTE — Assessment & Plan Note (Addendum)
Chronic back pain.  Patient continues hydrocodone 5-325 mg tablet every 6 hours as needed for pain.  Can not refill medication today . Patient needs a drug contract and UDS

## 2020-12-17 NOTE — Progress Notes (Signed)
Established Patient Office Visit  Subjective:  Patient ID: Cynthia Mays, female    DOB: June 11, 1941  Age: 80 y.o. MRN: [id number]  CC:  Chief Complaint  Patient presents with  . Medical Management of Chronic Issues  . Hypertension  . Back Pain  . Hypothyroidism    HPI JEANNENE TSCHETTER presents for Pt presents for follow up of hypertension. Patient was diagnosed in 02/20/2015. The patient is tolerating the medication well without side effects. Compliance with treatment has been good; including taking medication as directed , maintains a healthy diet and regular exercise regimen , and following up as directed.  The patient presents with history of type II diabetes mellitus without complications. Patient was diagnosed in 12/27/2013. Compliance with treatment has been good; the patient takes medication as directed , maintains a diabetic diet and an exercise regimen , follows up as directed , and is keeping a glucose diary. Sugars runs 100-110. Patient specifically denies associated symptoms, including blurred vision, fatigue, polydipsia, polyphagia and polyuria . Patient denies hypoglycemia. In regard to preventative care, the patient performs foot self-exams daily and last ophthalmology exam was in: We will schedule  Thyroid: Patient presents for evaluation of hypothyroidism. Current symptoms include weight gain. Patient denies tremulousness, palpitations, sweating.    Past Medical History:  Diagnosis Date  . Complication of anesthesia   . Depression   . Diabetes mellitus   . Fibromyalgia   . GERD (gastroesophageal reflux disease)   . Hiatal hernia   . Hyperlipidemia   . Hypertension   . Hypothyroidism   . Hypothyroidism 07/23/2016  . PONV (postoperative nausea and vomiting)     Past Surgical History:  Procedure Laterality Date  . APPENDECTOMY  2002  . arthroscopy  right 2002, left 2007   bilateral knees  . BACK SURGERY  10/2007, 09/2008   x2  . BIOPSY  10/02/2016    Procedure: BIOPSY;  Surgeon: Rogene Houston, MD;  [facility];  Service: Endoscopy;;  gastric  . BIOPSY  05/04/2020   Procedure: BIOPSY;  Surgeon: Montez Morita, Quillian Quince, MD;  [facility];  Service: Gastroenterology;;  . [place]   bilateral  . [place]   x2  . COLONOSCOPY WITH PROPOFOL N/A 05/04/2020   Procedure: COLONOSCOPY WITH PROPOFOL;  Surgeon: Harvel Quale, MD;  [facility];  Service: Gastroenterology;  Laterality: N/A;  11  . ESOPHAGOGASTRODUODENOSCOPY N/A 10/02/2016   Procedure: ESOPHAGOGASTRODUODENOSCOPY (EGD);  Surgeon: Rogene Houston, MD;  [facility];  Service: Endoscopy;  Laterality: N/A;  11:15 - moved to 2/1 @ 3:00  . ESOPHAGOGASTRODUODENOSCOPY (EGD) WITH PROPOFOL N/A 05/04/2020   Procedure: ESOPHAGOGASTRODUODENOSCOPY (EGD) WITH PROPOFOL;  Surgeon: Harvel Quale, MD;  [facility];  Service: Gastroenterology;  Laterality: N/A;  . KNEE SURGERY     both    Family History  Problem Relation Age of Onset  . Anesthesia problems Mother   . CAD Mother 22  . Cancer Mother        Pancreatic cancer  . Hypertension Father   . Suicidality Father        gunshot  . Deafness Sister   . Heart disease Brother   . Suicidality Son   . Cancer Sister        female   . Suicidality Brother   . Hypotension Neg Hx   . Pseudochol deficiency Neg Hx   . Malignant hyperthermia Neg Hx  Social History   Socioeconomic History  . Marital status: Widowed    Spouse name: Not on file  . Number of children: 4  . Years of education: 6  . Highest education level: 12th grade  Occupational History    Employer: RETIRED  Tobacco Use  . Smoking status: Former Smoker    Packs/day: 1.00    Years: 43.00    Pack years: 43.00    Types: Cigarettes    Quit date: 05/02/1970    Years since quitting: 50.6  . Smokeless tobacco: Never Used  . Tobacco comment: > 20 years quit   Vaping Use  . Vaping Use: Never used  Substance and Sexual Activity  . Alcohol use: No    Comment: occasionally wine  . Drug use: No  . Sexual activity: Yes    Birth control/protection: Post-menopausal  Other Topics Concern  . Not on file  Social History Narrative   Lives alone.  Widowed    1 dog, 2 cats   2 daughters, 1 local   Enjoys dancing and sewing   Social Determinants of Health   Financial Resource Strain: Low Risk   . Difficulty of Paying Living Expenses: Not hard at all  Food Insecurity: No Food Insecurity  . Worried About Charity fundraiser in the Last Year: Never true  . Ran Out of Food in the Last Year: Never true  Transportation Needs: No Transportation Needs  . Lack of Transportation (Medical): No  . Lack of Transportation (Non-Medical): No  Physical Activity: Insufficiently Active  . Days of Exercise per Week: 7 days  . Minutes of Exercise per Session: 20 min  Stress: No Stress Concern Present  . Feeling of Stress : Not at all  Social Connections: Moderately Integrated  . Frequency of Communication with Friends and Family: More than three times a week  . Frequency of Social Gatherings with Friends and Family: Once a week  . Attends Religious Services: More than 4 times per year  . Active Member of Clubs or Organizations: Yes  . Attends Archivist Meetings: More than 4 times per year  . Marital Status: Widowed  Intimate Partner Violence: Not At Risk  . Fear of Current or Ex-Partner: No  . Emotionally Abused: No  . Physically Abused: No  . Sexually Abused: No    Outpatient Medications Prior to Visit  Medication Sig Dispense Refill  . Accu-Chek Softclix Lancets lancets USE TO CHECK BLOOD SUGAR UP TO 4 TIMES A DAY AS DIRECTED    . benzonatate (TESSALON) 100 MG capsule Take 200 mg by mouth 3 (three) times daily as needed.    . blood glucose meter kit and supplies Dispense based on patient and insurance preference. Use up to four times daily as  directed. (FOR ICD-10 E10.9, E11.9). 1 each 0  . cholecalciferol (VITAMIN D3) 25 MCG (1000 UNIT) tablet Take 1,000 Units by mouth daily.     . clindamycin (CLEOCIN) 300 MG capsule Take 300 mg by mouth 3 (three) times daily.    . clobetasol cream (TEMOVATE) 2.13 % APPLY 1 APPLICATION TOPICALLY ONCE DAILY AS NEEDED FOR  RASH 30 g 11  . dapagliflozin propanediol (FARXIGA) 5 MG TABS tablet Take 1 tablet (5 mg total) by mouth daily before breakfast. 90 tablet 1  . diazepam (VALIUM) 5 MG tablet Take 1 tablet (5 mg total) by mouth every 6 (six) hours as needed for anxiety or muscle spasms. 30 tablet 2  . famotidine (PEPCID) 40 MG  tablet TAKE 1 TABLET BY MOUTH AT BEDTIME 90 tablet 0  . fexofenadine (ALLEGRA) 180 MG tablet Take 180 mg by mouth daily.    . fluticasone (FLONASE) 50 MCG/ACT nasal spray Place 2 sprays into the nose daily.    Marland Kitchen HYDROcodone-acetaminophen (NORCO/VICODIN) 5-325 MG tablet Take 1 tablet by mouth every 6 (six) hours as needed for moderate pain. 30 tablet 0  . levothyroxine (EUTHYROX) 112 MCG tablet TAKE 1 TABLET BY MOUTH ONCE DAILY BEFORE BREAKFAST 90 tablet 1  . mesalamine (LIALDA) 1.2 g EC tablet TAKE 2 TABLETS BY MOUTH ONCE DAILY WITH BREAKFAST 180 tablet 3  . metFORMIN (GLUCOPHAGE) 500 MG tablet Take 1 tablet (500 mg total) by mouth 2 (two) times daily. (Needs to be seen before next refill) (Patient not taking: Reported on 11/29/2020) 60 tablet 0  . Multiple Vitamins-Minerals (MULTIVITAMINS THER. W/MINERALS) TABS Take 1 tablet by mouth daily.    Marland Kitchen NIFEdipine (PROCARDIA-XL/NIFEDICAL-XL) 30 MG 24 hr tablet Take 1 tablet (30 mg total) by mouth daily. 90 tablet 1  . ondansetron (ZOFRAN ODT) 8 MG disintegrating tablet Take 1 tablet (8 mg total) by mouth every 8 (eight) hours as needed for nausea or vomiting. 20 tablet 0  . pantoprazole (PROTONIX) 40 MG tablet Take 1 tablet by mouth twice daily 180 tablet 0  . predniSONE (DELTASONE) 5 MG tablet Take by mouth. (Patient not taking: Reported  on 11/29/2020)    . Probiotic Product ([place]) CAPS Take 1 capsule by mouth daily.     . rosuvastatin (CRESTOR) 5 MG tablet Take 1 tablet by mouth once daily 90 tablet 0  . sertraline (ZOLOFT) 50 MG tablet TAKE 1 & 1/2 (ONE & ONE-HALF) TABLETS BY MOUTH ONCE DAILY 135 tablet 0  . tiZANidine (ZANAFLEX) 4 MG tablet Take 4 mg by mouth every 8 (eight) hours as needed.     No facility-administered medications prior to visit.    Allergies  Allergen Reactions  . Codeine Other (See Comments)    Headache, gi upset  . Penicillins Rash    Has patient had a PCN reaction causing immediate rash, facial/tongue/throat swelling, SOB or lightheadedness with hypotension: Yes Has patient had a PCN reaction causing severe rash involving mucus membranes or skin necrosis: No Has patient had a PCN reaction that required hospitalization No Has patient had a PCN reaction occurring within the last 10 years: No If all of the above answers are "NO", then may proceed with Cephalosporin use.   . Pravastatin     Leg cramps    ROS Review of Systems  Constitutional: Negative.   HENT: Negative.   Respiratory: Negative.   Cardiovascular: Negative.   Gastrointestinal: Negative.   Genitourinary: Negative.   Musculoskeletal: Positive for back pain.  All other systems reviewed and are negative.     Objective:    Physical Exam Vitals and nursing note reviewed.  Constitutional:      Appearance: Normal appearance.  HENT:     Head: Normocephalic.     Nose: Nose normal.  Eyes:     Conjunctiva/sclera: Conjunctivae normal.  Cardiovascular:     Rate and Rhythm: Normal rate.     Pulses: Normal pulses.     Heart sounds: Normal heart sounds.  Pulmonary:     Effort: Pulmonary effort is normal.     Breath sounds: Normal breath sounds.  Abdominal:     General: Bowel sounds are normal.  Musculoskeletal:        General: Normal range of motion.  Skin:    General: Skin is warm.  Neurological:      Mental Status: She is alert and oriented to person, place, and time.     There were no vitals taken for this visit. Wt Readings from Last 3 Encounters:  11/29/20 179 lb (81.2 kg)  09/25/20 185 lb (83.9 kg)  09/11/20 185 lb (83.9 kg)     Health Maintenance Due  Topic Date Due  . OPHTHALMOLOGY EXAM  02/18/2020  . URINE MICROALBUMIN  02/02/2021    There are no preventive care reminders to display for this patient.  Lab Results  Component Value Date   TSH 2.730 02/03/2020   Lab Results  Component Value Date   WBC 5.9 08/23/2020   HGB 12.8 08/23/2020   HCT 39.5 08/23/2020   MCV 83 08/23/2020   PLT 224 08/23/2020   Lab Results  Component Value Date   NA 141 09/11/2020   K 4.2 09/11/2020   CO2 22 09/11/2020   GLUCOSE 102 (H) 09/11/2020   BUN 22 09/11/2020   CREATININE 1.07 (H) 09/11/2020   BILITOT 0.3 09/11/2020   ALKPHOS 97 09/11/2020   AST 20 09/11/2020   ALT 13 09/11/2020   PROT 7.2 09/11/2020   ALBUMIN 4.1 09/11/2020   CALCIUM 9.4 09/11/2020   ANIONGAP 8 02/28/2020   Lab Results  Component Value Date   CHOL 171 08/23/2020   Lab Results  Component Value Date   HDL 58 08/23/2020   Lab Results  Component Value Date   LDLCALC 91 08/23/2020   Lab Results  Component Value Date   TRIG 127 08/23/2020   Lab Results  Component Value Date   CHOLHDL 2.9 08/23/2020   Lab Results  Component Value Date   HGBA1C 6.9 12/17/2020      Assessment & Plan:   Problem List Items Addressed This Visit      Cardiovascular and Mediastinum   Essential hypertension   Relevant Orders   Comprehensive metabolic panel   Lipid Panel     Endocrine   Type 2 diabetes mellitus without complication, without long-term current use of insulin ([place]) - Primary   Relevant Orders   Bayer DCA Hb A1c Waived   CBC with Differential/Platelet      No orders of the defined types were placed in this encounter.   Follow-up: Return in about 3 months (around 03/18/2021).     Ivy Lynn, NP

## 2020-12-17 NOTE — Assessment & Plan Note (Signed)
A1c completed

## 2020-12-17 NOTE — Assessment & Plan Note (Signed)
Essential hypertension well managed on current medication no changes necessary.  Follow-up in 3 months.  Labs completed CBC, CMP, TSH, lipid panel.  Results pending

## 2020-12-17 NOTE — Patient Instructions (Signed)
Diabetes Mellitus Basics  Diabetes mellitus, or diabetes, is a long-term (chronic) disease. It occurs when the body does not properly use sugar (glucose) that is released from food after you eat. Diabetes mellitus may be caused by one or both of these problems:  Your pancreas does not make enough of a hormone called insulin.  Your body does not react in a normal way to the insulin that it makes. Insulin lets glucose enter cells in your body. This gives you energy. If you have diabetes, glucose cannot get into cells. This causes high blood glucose (hyperglycemia). How to treat and manage diabetes You may need to take insulin or other diabetes medicines daily to keep your glucose in balance. If you are prescribed insulin, you will learn how to give yourself insulin by injection. You may need to adjust the amount of insulin you take based on the foods that you eat. You will need to check your blood glucose levels using a glucose monitor as told by your health care provider. The readings can help determine if you have low or high blood glucose. Generally, you should have these blood glucose levels:  Before meals (preprandial): 80-130 mg/dL (4.4-7.2 mmol/L).  After meals (postprandial): below 180 mg/dL (10 mmol/L).  Hemoglobin A1c (HbA1c) level: less than 7%. Your health care provider will set treatment goals for you. Keep all follow-up visits. This is important. Follow these instructions at home: Diabetes medicines Take your diabetes medicines every day as told by your health care provider. List your diabetes medicines here:  Name of medicine: ______________________________ ? Amount (dose): _______________ Time (a.m./p.m.): _______________ Notes: ___________________________________  Name of medicine: ______________________________ ? Amount (dose): _______________ Time (a.m./p.m.): _______________ Notes: ___________________________________  Name of medicine:  ______________________________ ? Amount (dose): _______________ Time (a.m./p.m.): _______________ Notes: ___________________________________ Insulin If you use insulin, list the types of insulin you use here:  Insulin type: ______________________________ ? Amount (dose): _______________ Time (a.m./p.m.): _______________Notes: ___________________________________  Insulin type: ______________________________ ? Amount (dose): _______________ Time (a.m./p.m.): _______________ Notes: ___________________________________  Insulin type: ______________________________ ? Amount (dose): _______________ Time (a.m./p.m.): _______________ Notes: ___________________________________  Insulin type: ______________________________ ? Amount (dose): _______________ Time (a.m./p.m.): _______________ Notes: ___________________________________  Insulin type: ______________________________ ? Amount (dose): _______________ Time (a.m./p.m.): _______________ Notes: ___________________________________ Managing blood glucose Check your blood glucose levels using a glucose monitor as told by your health care provider. Write down the times that you check your glucose levels here:  Time: _______________ Notes: ___________________________________  Time: _______________ Notes: ___________________________________  Time: _______________ Notes: ___________________________________  Time: _______________ Notes: ___________________________________  Time: _______________ Notes: ___________________________________  Time: _______________ Notes: ___________________________________   Low blood glucose Low blood glucose (hypoglycemia) is when glucose is at or below 70 mg/dL (3.9 mmol/L). Symptoms may include:  Feeling: ? Hungry. ? Sweaty and clammy. ? Irritable or easily upset. ? Dizzy. ? Sleepy.  Having: ? A fast heartbeat. ? A headache. ? A change in your vision. ? Numbness around the mouth, lips, or  tongue.  Having trouble with: ? Moving (coordination). ? Sleeping. Treating low blood glucose To treat low blood glucose, eat or drink something containing sugar right away. If you can think clearly and swallow safely, follow the 15:15 rule:  Take 15 grams of a fast-acting carb (carbohydrate), as told by your health care provider.  Some fast-acting carbs are: ? Glucose tablets: take 3-4 tablets. ? Hard candy: eat 3-5 pieces. ? Fruit juice: drink 4 oz (120 mL). ? Regular (not diet) soda: drink 4-6 oz (120-180 mL). ? Honey or sugar:   eat 1 Tbsp (15 mL).  Check your blood glucose levels 15 minutes after you take the carb.  If your glucose is still at or below 70 mg/dL (3.9 mmol/L), take 15 grams of a carb again.  If your glucose does not go above 70 mg/dL (3.9 mmol/L) after 3 tries, get help right away.  After your glucose goes back to normal, eat a meal or a snack within 1 hour. Treating very low blood glucose If your glucose is at or below 54 mg/dL (3 mmol/L), you have very low blood glucose (severe hypoglycemia). This is an emergency. Do not wait to see if the symptoms will go away. Get medical help right away. Call your local emergency services (911 in the [place] Do not drive yourself to the hospital. Questions to ask your health care provider  Should I talk with a diabetes educator?  What equipment will I need to care for myself at home?  What diabetes medicines do I need? When should I take them?  How often do I need to check my blood glucose levels?  What number can I call if I have questions?  When is my follow-up visit?  Where can I find a support group for people with diabetes? Where to find more information  American Diabetes Association: www.diabetes.org  Association of Diabetes Care and Education Specialists: www.diabeteseducator.org Contact a health care provider if:  Your blood glucose is at or above 240 mg/dL (13.3 mmol/L) for 2 days in a row.  You have  been sick or have had a fever for 2 days or more, and you are not getting better.  You have any of these problems for more than 6 hours: ? You cannot eat or drink. ? You feel nauseous. ? You vomit. ? You have diarrhea. Get help right away if:  Your blood glucose is lower than 54 mg/dL (3 mmol/L).  You get confused.  You have trouble thinking clearly.  You have trouble breathing. These symptoms may represent a serious problem that is an emergency. Do not wait to see if the symptoms will go away. Get medical help right away. Call your local emergency services (911 in the [place] Do not drive yourself to the hospital. Summary  Diabetes mellitus is a chronic disease that occurs when the body does not properly use sugar (glucose) that is released from food after you eat.  Take insulin and diabetes medicines as told.  Check your blood glucose every day, as often as told.  Keep all follow-up visits. This is important. This information is not intended to replace advice given to you by your health care provider. Make sure you discuss any questions you have with your health care provider. Document Revised: 12/20/2019 Document Reviewed: 12/20/2019 Elsevier Patient Education  2021 [employer] Inc.  

## 2020-12-18 LAB — COMPREHENSIVE METABOLIC PANEL
ALT: 14 IU/L (ref 0–32)
AST: 24 IU/L (ref 0–40)
Albumin/Globulin Ratio: 1.3 (ref 1.2–2.2)
Albumin: 4.2 g/dL (ref 3.7–4.7)
Alkaline Phosphatase: 90 IU/L (ref 44–121)
BUN/Creatinine Ratio: 13 (ref 12–28)
BUN: 14 mg/dL (ref 8–27)
Bilirubin Total: 0.5 mg/dL (ref 0.0–1.2)
CO2: 22 mmol/L (ref 20–29)
Calcium: 9.5 mg/dL (ref 8.7–10.3)
Chloride: 105 mmol/L (ref 96–106)
Creatinine, Ser: 1.07 mg/dL — ABNORMAL HIGH (ref 0.57–1.00)
Globulin, Total: 3.3 g/dL (ref 1.5–4.5)
Glucose: 107 mg/dL — ABNORMAL HIGH (ref 65–99)
Potassium: 4.7 mmol/L (ref 3.5–5.2)
Sodium: 143 mmol/L (ref 134–144)
Total Protein: 7.5 g/dL (ref 6.0–8.5)
eGFR: 53 mL/min/{1.73_m2} — ABNORMAL LOW (ref >59)

## 2020-12-18 LAB — LIPID PANEL
Chol/HDL Ratio: 3 ratio (ref 0.0–4.4)
Cholesterol, Total: 194 mg/dL (ref 100–199)
HDL: 64 mg/dL (ref >39)
LDL Chol Calc (NIH): 108 mg/dL — ABNORMAL HIGH (ref 0–99)
Triglycerides: 127 mg/dL (ref 0–149)
VLDL Cholesterol Cal: 22 mg/dL (ref 5–40)

## 2020-12-18 LAB — TSH: TSH: 1.17 u[IU]/mL (ref 0.450–4.500)

## 2020-12-19 ENCOUNTER — Other Ambulatory Visit: Payer: Self-pay | Admitting: Nurse Practitioner

## 2020-12-19 ENCOUNTER — Other Ambulatory Visit: Payer: 59

## 2020-12-19 DIAGNOSIS — G8929 Other chronic pain: Secondary | ICD-10-CM

## 2020-12-19 DIAGNOSIS — M5440 Lumbago with sciatica, unspecified side: Secondary | ICD-10-CM

## 2020-12-19 DIAGNOSIS — Z79899 Other long term (current) drug therapy: Secondary | ICD-10-CM

## 2020-12-19 MED ORDER — HYDROCODONE-ACETAMINOPHEN 5-325 MG PO TABS: 1.0000 | ORAL_TABLET | Freq: Four times a day (QID) | ORAL | 0 refills | Status: DC | PRN

## 2020-12-20 ENCOUNTER — Encounter: Payer: Self-pay | Admitting: Nurse Practitioner

## 2020-12-27 LAB — TOXASSURE SELECT 13 (MW), URINE

## 2021-01-21 LAB — HM DIABETES EYE EXAM

## 2021-02-04 ENCOUNTER — Telehealth: Payer: Self-pay | Admitting: Nurse Practitioner

## 2021-02-04 NOTE — Telephone Encounter (Signed)
Je, please advise.  I can schedule patient to come in for refill, just wanted to make sure this is something you are planning to prescribe for her.

## 2021-02-05 NOTE — Telephone Encounter (Signed)
Patient aware- Previous Cynthia Mays patient - would you be willing to take patient back and fill her Vallum?

## 2021-02-07 NOTE — Telephone Encounter (Signed)
Appointment scheduled.

## 2021-02-07 NOTE — Telephone Encounter (Signed)
Ok for me to take.

## 2021-02-11 ENCOUNTER — Ambulatory Visit (INDEPENDENT_AMBULATORY_CARE_PROVIDER_SITE_OTHER): Payer: 59 | Admitting: Family

## 2021-02-11 ENCOUNTER — Other Ambulatory Visit: Payer: Self-pay

## 2021-02-11 ENCOUNTER — Encounter: Payer: Self-pay | Admitting: Family

## 2021-02-11 VITALS — BP 106/72 | HR 56 | Temp 97.5°F | Ht 59.0 in | Wt 183.8 lb

## 2021-02-11 DIAGNOSIS — B9689 Other specified bacterial agents as the cause of diseases classified elsewhere: Secondary | ICD-10-CM

## 2021-02-11 DIAGNOSIS — J208 Acute bronchitis due to other specified organisms: Secondary | ICD-10-CM

## 2021-02-11 DIAGNOSIS — E119 Type 2 diabetes mellitus without complications: Secondary | ICD-10-CM | POA: Diagnosis not present

## 2021-02-11 DIAGNOSIS — F411 Generalized anxiety disorder: Secondary | ICD-10-CM

## 2021-02-11 DIAGNOSIS — I10 Essential (primary) hypertension: Secondary | ICD-10-CM

## 2021-02-11 MED ORDER — DIAZEPAM 5 MG PO TABS
5.0000 mg | ORAL_TABLET | Freq: Four times a day (QID) | ORAL | 2 refills | Status: DC | PRN
Start: 2021-02-11 — End: 2021-08-13

## 2021-02-11 MED ORDER — DOXYCYCLINE HYCLATE 100 MG PO TABS: 100.0000 mg | ORAL_TABLET | Freq: Two times a day (BID) | ORAL | 0 refills | Status: DC

## 2021-02-11 NOTE — Progress Notes (Signed)
Subjective:    Patient ID: Cynthia Mays, female    DOB: [birth year], 80 y.o.   MRN: [id number]  Chief Complaint  Patient presents with   Fever    COVID test last Monday AT HOME NEGATIVE ALSO WHEN SYMPTOMS STARTED    Chills   Cough   Medical Management of Chronic Issues   Fall    BALANCE IS OFF    Pt presents to the office today with cough and fever last week. She took an at home COVID test that was negative.  Fever  The maximum temperature noted was 102 to 102.9 F. Associated symptoms include coughing, ear pain (right), headaches and a sore throat. Pertinent negatives include no wheezing.  Cough This is a new problem. The current episode started 1 to 4 weeks ago. The problem has been waxing and waning. The problem occurs every few minutes. The cough is Productive of sputum. Associated symptoms include chills, ear congestion, ear pain (right), a fever, headaches, myalgias, nasal congestion, postnasal drip, rhinorrhea, a sore throat and shortness of breath. Pertinent negatives include no wheezing. Risk factors for lung disease include smoking/tobacco exposure. She has tried rest and OTC cough suppressant for the symptoms. The treatment provided mild relief.  Fall Associated symptoms include a fever and headaches.  Hypertension This is a chronic problem. The current episode started more than 1 year ago. The problem has been resolved since onset. The problem is controlled. Associated symptoms include headaches and shortness of breath. Pertinent negatives include no blurred vision, malaise/fatigue or peripheral edema. Risk factors for coronary artery disease include dyslipidemia, diabetes mellitus, obesity and sedentary lifestyle. The current treatment provides moderate improvement.  Diabetes She presents for her follow-up diabetic visit. She has type 2 diabetes mellitus. Hypoglycemia symptoms include headaches. Pertinent negatives for diabetes include no blurred vision. Symptoms are  stable. Diabetic complications include heart disease. Risk factors for coronary artery disease include dyslipidemia, diabetes mellitus, hypertension, sedentary lifestyle and post-menopausal.     Review of Systems  Constitutional:  Positive for chills and fever. Negative for malaise/fatigue.  HENT:  Positive for ear pain (right), postnasal drip, rhinorrhea and sore throat.   Eyes:  Negative for blurred vision.  Respiratory:  Positive for cough and shortness of breath. Negative for wheezing.   Musculoskeletal:  Positive for myalgias.  Neurological:  Positive for headaches.  All other systems reviewed and are negative.     Objective:   Physical Exam Vitals reviewed.  Constitutional:      General: She is not in acute distress.    Appearance: She is well-developed.  HENT:     Head: Normocephalic and atraumatic.     Right Ear: Tympanic membrane normal.     Left Ear: Tympanic membrane normal.  Eyes:     Pupils: Pupils are equal, round, and reactive to light.  Neck:     Thyroid: No thyromegaly.  Cardiovascular:     Rate and Rhythm: Normal rate and regular rhythm.     Heart sounds: Normal heart sounds. No murmur heard. Pulmonary:     Effort: Pulmonary effort is normal. No respiratory distress.     Breath sounds: Rhonchi present. No wheezing.  Abdominal:     General: Bowel sounds are normal. There is no distension.     Palpations: Abdomen is soft.     Tenderness: There is no abdominal tenderness.  Musculoskeletal:        General: No tenderness. Normal range of motion.     Cervical back: Normal  range of motion and neck supple.  Skin:    General: Skin is warm and dry.  Neurological:     Mental Status: She is alert and oriented to person, place, and time.     Cranial Nerves: No cranial nerve deficit.     Deep Tendon Reflexes: Reflexes are normal and symmetric.  Psychiatric:        Behavior: Behavior normal.        Thought Content: Thought content normal.        Judgment: Judgment  normal.     BP 106/72   Pulse (!) 56   Temp (!) 97.5 F (36.4 C) (Temporal)   Ht 4\' 11"  (1.499 m)   Wt 183 lb 12.8 oz (83.4 kg)   SpO2 95%   BMI 37.12 kg/m       Assessment & Plan:   Cynthia Mays comes in today with chief complaint of Fever (COVID test last Monday AT HOME NEGATIVE ALSO WHEN SYMPTOMS STARTED ), Chills, Cough, Medical Management of Chronic Issues, and Fall (BALANCE IS OFF/)   Diagnosis and orders addressed:  1. Acute bacterial bronchitis - Take meds as prescribed - Use a cool mist humidifier  -Use saline nose sprays frequently -Force fluids -For any cough or congestion  Use plain Mucinex- regular strength or max strength is fine -For fever or aces or pains- take tylenol or ibuprofen. -Throat lozenges if help -RTO if symptoms worsen or do not improve  - doxycycline (VIBRA-TABS) 100 MG tablet; Take 1 tablet (100 mg total) by mouth 2 (two) times daily.  Dispense: 20 tablet; Refill: 0  2. Anxiety, generalized Patient reviewed in [place] controlled database, no flags noted. Contract and drug screen are up to date.  - diazepam (VALIUM) 5 MG tablet; Take 1 tablet (5 mg total) by mouth every 6 (six) hours as needed for anxiety or muscle spasms.  Dispense: 30 tablet; Refill: 2   3. Essential hypertension  4. Type 2 diabetes mellitus without complication, without long-term current use of insulin (HCC)    Sunday, FNP

## 2021-02-11 NOTE — Patient Instructions (Signed)

## 2021-02-20 ENCOUNTER — Other Ambulatory Visit: Payer: Self-pay | Admitting: Family

## 2021-02-20 DIAGNOSIS — I10 Essential (primary) hypertension: Secondary | ICD-10-CM

## 2021-02-26 ENCOUNTER — Other Ambulatory Visit: Payer: Self-pay | Admitting: Family

## 2021-02-26 DIAGNOSIS — E1169 Type 2 diabetes mellitus with other specified complication: Secondary | ICD-10-CM

## 2021-02-26 DIAGNOSIS — Z7689 Persons encountering health services in other specified circumstances: Secondary | ICD-10-CM

## 2021-03-18 ENCOUNTER — Ambulatory Visit: Payer: 59 | Admitting: Nurse Practitioner

## 2021-03-18 ENCOUNTER — Ambulatory Visit (INDEPENDENT_AMBULATORY_CARE_PROVIDER_SITE_OTHER): Payer: 59 | Admitting: Family

## 2021-03-18 ENCOUNTER — Encounter: Payer: Self-pay | Admitting: Family

## 2021-03-18 ENCOUNTER — Other Ambulatory Visit: Payer: Self-pay

## 2021-03-18 ENCOUNTER — Ambulatory Visit (HOSPITAL_COMMUNITY)
Admission: RE | Admit: 2021-03-18 | Discharge: 2021-03-18 | Disposition: A | Discharge status: Home / Self Care | Payer: 59 | Source: Ambulatory Visit | Attending: Family | Admitting: Family

## 2021-03-18 VITALS — BP 131/82 | HR 69 | Temp 97.8°F | Ht 59.0 in | Wt 185.0 lb

## 2021-03-18 DIAGNOSIS — R81 Glycosuria: Secondary | ICD-10-CM

## 2021-03-18 DIAGNOSIS — R1031 Right lower quadrant pain: Secondary | ICD-10-CM

## 2021-03-18 DIAGNOSIS — E1169 Type 2 diabetes mellitus with other specified complication: Secondary | ICD-10-CM | POA: Diagnosis not present

## 2021-03-18 DIAGNOSIS — R399 Unspecified symptoms and signs involving the genitourinary system: Secondary | ICD-10-CM

## 2021-03-18 LAB — URINALYSIS, COMPLETE
Bilirubin, UA: NEGATIVE
Ketones, UA: NEGATIVE
Leukocytes,UA: NEGATIVE
Nitrite, UA: NEGATIVE
Protein,UA: NEGATIVE
RBC, UA: NEGATIVE
Specific Gravity, UA: 1.015 (ref 1.005–1.030)
Urobilinogen, Ur: 0.2 mg/dL (ref 0.2–1.0)
pH, UA: 5 (ref 5.0–7.5)

## 2021-03-18 LAB — CBC WITH DIFFERENTIAL/PLATELET
Basophils Absolute: 0.1 10*3/uL (ref 0.0–0.2)
Basos: 1 %
EOS (ABSOLUTE): 0.4 10*3/uL (ref 0.0–0.4)
Eos: 6 %
Hematocrit: 40.7 % (ref 34.0–46.6)
Hemoglobin: 12.9 g/dL (ref 11.1–15.9)
Immature Grans (Abs): 0 10*3/uL (ref 0.0–0.1)
Immature Granulocytes: 0 %
Lymphocytes Absolute: 2 10*3/uL (ref 0.7–3.1)
Lymphs: 31 %
MCH: 26 pg — ABNORMAL LOW (ref 26.6–33.0)
MCHC: 31.7 g/dL (ref 31.5–35.7)
MCV: 82 fL (ref 79–97)
Monocytes Absolute: 0.5 10*3/uL (ref 0.1–0.9)
Monocytes: 8 %
Neutrophils Absolute: 3.4 10*3/uL (ref 1.4–7.0)
Neutrophils: 54 %
Platelets: 177 10*3/uL (ref 150–450)
RBC: 4.97 x10E6/uL (ref 3.77–5.28)
RDW: 14.4 % (ref 11.7–15.4)
WBC: 6.3 10*3/uL (ref 3.4–10.8)

## 2021-03-18 LAB — MICROSCOPIC EXAMINATION

## 2021-03-18 LAB — POCT I-STAT CREATININE: Creatinine, Ser: 1 mg/dL (ref 0.44–1.00)

## 2021-03-18 LAB — BAYER DCA HB A1C WAIVED: HB A1C (BAYER DCA - WAIVED): 5.3 % (ref <7.0)

## 2021-03-18 MED ORDER — IOHEXOL 300 MG/ML  SOLN
100.0000 mL | Freq: Once | INTRAMUSCULAR | Status: AC | PRN
  Administered 2021-03-18: 100 mL via INTRAVENOUS

## 2021-03-18 NOTE — Progress Notes (Signed)
Subjective:    Patient ID: Cynthia Mays, female    DOB: 09-06-1940, 80 y.o.   MRN: [id number]  Chief Complaint  Patient presents with   Back Pain    Started Tuesday just keeps getting bad    Pelvic Pain   Pt presents today with RLQ pain and back pain that started last week, but has worsen. She states at times the pain can be a 9 or 10.  Back Pain This is a new problem. The current episode started in the past 7 days. The problem occurs intermittently. The problem has been waxing and waning since onset. Pain location: right flank and right lower abd. The pain does not radiate. The pain is at a severity of 10/10. The pain is moderate. Associated symptoms include pelvic pain. Pertinent negatives include no dysuria, fever or weakness. (Urinary frequency, urgency ) She has tried nothing for the symptoms.  Pelvic Pain The patient's primary symptoms include pelvic pain. Associated symptoms include back pain, frequency and urgency. Pertinent negatives include no constipation, diarrhea, dysuria, fever, hematuria or nausea.     Review of Systems  Constitutional:  Negative for fever.  Gastrointestinal:  Negative for constipation, diarrhea and nausea.  Genitourinary:  Positive for frequency, pelvic pain and urgency. Negative for dysuria and hematuria.  Musculoskeletal:  Positive for back pain.  Neurological:  Negative for weakness.  All other systems reviewed and are negative.     Objective:   Physical Exam Vitals reviewed.  Constitutional:      General: She is not in acute distress.    Appearance: She is well-developed.  HENT:     Head: Normocephalic and atraumatic.     Right Ear: Tympanic membrane normal.     Left Ear: Tympanic membrane normal.  Eyes:     Pupils: Pupils are equal, round, and reactive to light.  Neck:     Thyroid: No thyromegaly.  Cardiovascular:     Rate and Rhythm: Normal rate and regular rhythm.     Heart sounds: Normal heart sounds. No murmur  heard. Pulmonary:     Effort: Pulmonary effort is normal. No respiratory distress.     Breath sounds: Normal breath sounds. No wheezing.  Abdominal:     General: Bowel sounds are normal. There is no distension.     Palpations: Abdomen is soft.     Tenderness: There is abdominal tenderness (RLQ). There is guarding. There is no right CVA tenderness or left CVA tenderness.  Musculoskeletal:        General: No tenderness. Normal range of motion.     Cervical back: Normal range of motion and neck supple.  Skin:    General: Skin is warm and dry.  Neurological:     Mental Status: She is alert and oriented to person, place, and time.     Cranial Nerves: No cranial nerve deficit.     Deep Tendon Reflexes: Reflexes are normal and symmetric.  Psychiatric:        Behavior: Behavior normal.        Thought Content: Thought content normal.        Judgment: Judgment normal.     BP 131/82   Pulse 69   Temp 97.8 F (36.6 C) (Temporal)   Ht 4\' 11"  (1.499 m)   Wt 185 lb (83.9 kg)   SpO2 97%   BMI 37.37 kg/m       Assessment & Plan:  AWILDA COVIN comes in today with chief complaint of Back  Pain (Started Tuesday just keeps getting bad ) and Pelvic Pain   Diagnosis and orders addressed:  1. UTI symptoms - Urinalysis, Complete - Urine Culture - CBC with Differential/Platelet  2. Glucose found in urine on examination - CBC with Differential/Platelet - Bayer DCA Hb A1c Waived  3. Type 2 diabetes mellitus with other specified complication, without long-term current use of insulin (HCC) - CBC with Differential/Platelet - Bayer DCA Hb A1c Waived  4. Right lower quadrant abdominal pain - CBC with Differential/Platelet - CT Abdomen Pelvis W Contrast; Future  Urine negative, given glucose in urine have added A1C. However, this is normal range of 5.3%.  Labs pending Given amount of pain will do CT scan. Pt does not have appendix.    [name], FNP

## 2021-03-18 NOTE — Patient Instructions (Addendum)

## 2021-03-19 ENCOUNTER — Other Ambulatory Visit: Payer: Self-pay | Admitting: Family

## 2021-03-19 DIAGNOSIS — E279 Disorder of adrenal gland, unspecified: Secondary | ICD-10-CM | POA: Insufficient documentation

## 2021-03-20 ENCOUNTER — Other Ambulatory Visit: Payer: Self-pay | Admitting: Family

## 2021-03-20 DIAGNOSIS — E119 Type 2 diabetes mellitus without complications: Secondary | ICD-10-CM

## 2021-03-20 LAB — URINE CULTURE

## 2021-05-14 ENCOUNTER — Encounter: Payer: Self-pay | Admitting: Family

## 2021-05-14 ENCOUNTER — Other Ambulatory Visit: Payer: Self-pay

## 2021-05-14 ENCOUNTER — Ambulatory Visit (INDEPENDENT_AMBULATORY_CARE_PROVIDER_SITE_OTHER): Payer: 59 | Admitting: Family

## 2021-05-14 VITALS — BP 123/83 | HR 61 | Temp 97.8°F | Ht 59.0 in | Wt 184.6 lb

## 2021-05-14 DIAGNOSIS — E1169 Type 2 diabetes mellitus with other specified complication: Secondary | ICD-10-CM

## 2021-05-14 DIAGNOSIS — E66813 Obesity, class 3: Secondary | ICD-10-CM

## 2021-05-14 DIAGNOSIS — I2510 Atherosclerotic heart disease of native coronary artery without angina pectoris: Secondary | ICD-10-CM

## 2021-05-14 DIAGNOSIS — M5440 Lumbago with sciatica, unspecified side: Secondary | ICD-10-CM

## 2021-05-14 DIAGNOSIS — E039 Hypothyroidism, unspecified: Secondary | ICD-10-CM

## 2021-05-14 DIAGNOSIS — G8929 Other chronic pain: Secondary | ICD-10-CM

## 2021-05-14 DIAGNOSIS — E785 Hyperlipidemia, unspecified: Secondary | ICD-10-CM

## 2021-05-14 DIAGNOSIS — G4700 Insomnia, unspecified: Secondary | ICD-10-CM | POA: Insufficient documentation

## 2021-05-14 DIAGNOSIS — F411 Generalized anxiety disorder: Secondary | ICD-10-CM

## 2021-05-14 DIAGNOSIS — K219 Gastro-esophageal reflux disease without esophagitis: Secondary | ICD-10-CM

## 2021-05-14 DIAGNOSIS — I10 Essential (primary) hypertension: Secondary | ICD-10-CM | POA: Diagnosis not present

## 2021-05-14 DIAGNOSIS — Z23 Encounter for immunization: Secondary | ICD-10-CM

## 2021-05-14 DIAGNOSIS — I2583 Coronary atherosclerosis due to lipid rich plaque: Secondary | ICD-10-CM

## 2021-05-14 LAB — CMP14+EGFR
ALT: 10 IU/L (ref 0–32)
AST: 17 IU/L (ref 0–40)
Albumin/Globulin Ratio: 1.4 (ref 1.2–2.2)
Albumin: 4.4 g/dL (ref 3.7–4.7)
Alkaline Phosphatase: 105 IU/L (ref 44–121)
BUN/Creatinine Ratio: 15 (ref 12–28)
BUN: 15 mg/dL (ref 8–27)
Bilirubin Total: 0.5 mg/dL (ref 0.0–1.2)
CO2: 25 mmol/L (ref 20–29)
Calcium: 9.6 mg/dL (ref 8.7–10.3)
Chloride: 103 mmol/L (ref 96–106)
Creatinine, Ser: 1.02 mg/dL — ABNORMAL HIGH (ref 0.57–1.00)
Globulin, Total: 3.2 g/dL (ref 1.5–4.5)
Glucose: 145 mg/dL — ABNORMAL HIGH (ref 65–99)
Potassium: 4.1 mmol/L (ref 3.5–5.2)
Sodium: 141 mmol/L (ref 134–144)
Total Protein: 7.6 g/dL (ref 6.0–8.5)
eGFR: 56 mL/min/{1.73_m2} — ABNORMAL LOW (ref >59)

## 2021-05-14 LAB — BAYER DCA HB A1C WAIVED: HB A1C (BAYER DCA - WAIVED): 6.5 % — ABNORMAL HIGH (ref 4.8–5.6)

## 2021-05-14 MED ORDER — HYDROCODONE-ACETAMINOPHEN 5-325 MG PO TABS: 1.0000 | ORAL_TABLET | Freq: Four times a day (QID) | ORAL | 0 refills | Status: DC | PRN

## 2021-05-14 MED ORDER — TRAZODONE HCL 50 MG PO TABS: 50.0000 mg | ORAL_TABLET | Freq: Every day | ORAL | 1 refills | Status: DC

## 2021-05-14 NOTE — Patient Instructions (Signed)
Insomnia Insomnia is a sleep disorder that makes it difficult to fall asleep or stay asleep. Insomnia can cause fatigue, low energy, difficulty concentrating, moodswings, and poor performance at work or school. There are three different ways to classify insomnia: Difficulty falling asleep. Difficulty staying asleep. Waking up too early in the morning. Any type of insomnia can be long-term (chronic) or short-term (acute). Both are common. Short-term insomnia usually lasts for three months or less. Chronic insomnia occurs at least three times a week for longer than threemonths. What are the causes? Insomnia may be caused by another condition, situation, or substance, such as: Anxiety. Certain medicines. Gastroesophageal reflux disease (GERD) or other gastrointestinal conditions. Asthma or other breathing conditions. Restless legs syndrome, sleep apnea, or other sleep disorders. Chronic pain. Menopause. Stroke. Abuse of alcohol, tobacco, or illegal drugs. Mental health conditions, such as depression. Caffeine. Neurological disorders, such as Alzheimer's disease. An overactive thyroid (hyperthyroidism). Sometimes, the cause of insomnia may not be known. What increases the risk? Risk factors for insomnia include: Gender. Women are affected more often than men. Age. Insomnia is more common as you get older. Stress. Lack of exercise. Irregular work schedule or working night shifts. Traveling between different time zones. Certain medical and mental health conditions. What are the signs or symptoms? If you have insomnia, the main symptom is having trouble falling asleep or having trouble staying asleep. This may lead to other symptoms, such as: Feeling fatigued or having low energy. Feeling nervous about going to sleep. Not feeling rested in the morning. Having trouble concentrating. Feeling irritable, anxious, or depressed. How is this diagnosed? This condition may be diagnosed based  on: Your symptoms and medical history. Your health care provider may ask about: Your sleep habits. Any medical conditions you have. Your mental health. A physical exam. How is this treated? Treatment for insomnia depends on the cause. Treatment may focus on treating an underlying condition that is causing insomnia. Treatment may also include: Medicines to help you sleep. Counseling or therapy. Lifestyle adjustments to help you sleep better. Follow these instructions at home: Eating and drinking  Limit or avoid alcohol, caffeinated beverages, and cigarettes, especially close to bedtime. These can disrupt your sleep. Do not eat a large meal or eat spicy foods right before bedtime. This can lead to digestive discomfort that can make it hard for you to sleep.  Sleep habits  Keep a sleep diary to help you and your health care provider figure out what could be causing your insomnia. Write down: When you sleep. When you wake up during the night. How well you sleep. How rested you feel the next day. Any side effects of medicines you are taking. What you eat and drink. Make your bedroom a dark, comfortable place where it is easy to fall asleep. Put up shades or blackout curtains to block light from outside. Use a white noise machine to block noise. Keep the temperature cool. Limit screen use before bedtime. This includes: Watching TV. Using your smartphone, tablet, or computer. Stick to a routine that includes going to bed and waking up at the same times every day and night. This can help you fall asleep faster. Consider making a quiet activity, such as reading, part of your nighttime routine. Try to avoid taking naps during the day so that you sleep better at night. Get out of bed if you are still awake after 15 minutes of trying to sleep. Keep the lights down, but try reading or doing a quiet   activity. When you feel sleepy, go back to bed.  General instructions Take over-the-counter  and prescription medicines only as told by your health care provider. Exercise regularly, as told by your health care provider. Avoid exercise starting several hours before bedtime. Use relaxation techniques to manage stress. Ask your health care provider to suggest some techniques that may work well for you. These may include: Breathing exercises. Routines to release muscle tension. Visualizing peaceful scenes. Make sure that you drive carefully. Avoid driving if you feel very sleepy. Keep all follow-up visits as told by your health care provider. This is important. Contact a health care provider if: You are tired throughout the day. You have trouble in your daily routine due to sleepiness. You continue to have sleep problems, or your sleep problems get worse. Get help right away if: You have serious thoughts about hurting yourself or someone else. If you ever feel like you may hurt yourself or others, or have thoughts about taking your own life, get help right away. You can go to your nearest emergency department or call: Your local emergency services (911 in the [place] A suicide crisis helpline, such as the National Suicide Prevention Lifeline at 1-800-273-8255. This is open 24 hours a day. Summary Insomnia is a sleep disorder that makes it difficult to fall asleep or stay asleep. Insomnia can be long-term (chronic) or short-term (acute). Treatment for insomnia depends on the cause. Treatment may focus on treating an underlying condition that is causing insomnia. Keep a sleep diary to help you and your health care provider figure out what could be causing your insomnia. This information is not intended to replace advice given to you by your health care provider. Make sure you discuss any questions you have with your healthcare provider. Document Revised: 06/28/2020 Document Reviewed: 06/28/2020 Elsevier Patient Education  2022 [employer]  

## 2021-05-14 NOTE — Progress Notes (Signed)
Subjective:    Patient ID: JAKEIRA SEEMAN, female    DOB: 01-07-1941, 80 y.o.   MRN: [id number]  Chief Complaint  Patient presents with   Medical Management of Chronic Issues    Hypertension This is a chronic problem. The current episode started more than 1 year ago. The problem has been resolved since onset. The problem is controlled. Associated symptoms include malaise/fatigue. Pertinent negatives include no blurred vision, peripheral edema or shortness of breath. Risk factors for coronary artery disease include dyslipidemia, obesity and sedentary lifestyle. The current treatment provides moderate improvement. There is no history of heart failure. Identifiable causes of hypertension include a thyroid problem.  Hyperlipidemia This is a chronic problem. The current episode started more than 1 year ago. The problem is controlled. Exacerbating diseases include obesity. Pertinent negatives include no shortness of breath. Current antihyperlipidemic treatment includes statins. The current treatment provides moderate improvement of lipids. Risk factors for coronary artery disease include dyslipidemia, hypertension, a sedentary lifestyle and post-menopausal.  Diabetes She presents for her follow-up diabetic visit. She has type 2 diabetes mellitus. Hypoglycemia symptoms include nervousness/anxiousness. Associated symptoms include fatigue. Pertinent negatives for diabetes include no blurred vision and no foot paresthesias. Symptoms are stable. Diabetic complications include heart disease. Risk factors for coronary artery disease include dyslipidemia, diabetes mellitus, hypertension, sedentary lifestyle and post-menopausal. She is following a generally unhealthy diet. Her overall blood glucose range is 90-110 mg/dl. Eye exam is current.  Thyroid Problem Presents for follow-up visit. Symptoms include anxiety and fatigue. Patient reports no constipation, depressed mood, diaphoresis or heat intolerance. The  symptoms have been stable. Her past medical history is significant for hyperlipidemia. There is no history of heart failure.  Gastroesophageal Reflux She complains of belching and heartburn. This is a chronic problem. The current episode started more than 1 year ago. The problem occurs occasionally. Associated symptoms include fatigue. She has tried a PPI for the symptoms. The treatment provided moderate relief.  Anxiety Presents for follow-up visit. Symptoms include excessive worry, insomnia, irritability and nervous/anxious behavior. Patient reports no depressed mood or shortness of breath. Symptoms occur most days. The severity of symptoms is moderate.    Depression        This is a chronic problem.  The current episode started more than 1 year ago.   The onset quality is gradual.   The problem occurs intermittently.  Associated symptoms include fatigue, insomnia, irritable and sad.  Associated symptoms include no helplessness and no hopelessness.  Past treatments include SSRIs - Selective serotonin reuptake inhibitors.  Compliance with treatment is good.  Past medical history includes thyroid problem.   Back Pain This is a chronic problem. The current episode started more than 1 year ago. The problem occurs intermittently. The problem has been waxing and waning since onset. The pain is present in the lumbar spine. The pain is at a severity of 8/10. The pain is moderate. She has tried analgesics for the symptoms. The treatment provided mild relief.  Insomnia Primary symptoms: difficulty falling asleep, frequent awakening, malaise/fatigue.   The current episode started more than one year. The onset quality is gradual. The problem occurs intermittently. PMH includes: depression.      Review of Systems  Constitutional:  Positive for fatigue, irritability and malaise/fatigue. Negative for diaphoresis.  Eyes:  Negative for blurred vision.  Respiratory:  Negative for shortness of breath.    Gastrointestinal:  Positive for heartburn. Negative for constipation.  Endocrine: Negative for heat intolerance.  Musculoskeletal:  Positive for back pain.  Psychiatric/Behavioral:  Positive for depression. The patient is nervous/anxious and has insomnia.   All other systems reviewed and are negative.     Objective:   Physical Exam Vitals reviewed.  Constitutional:      General: She is irritable. She is not in acute distress.    Appearance: She is well-developed. She is obese.  HENT:     Head: Normocephalic and atraumatic.     Right Ear: Tympanic membrane normal.     Left Ear: Tympanic membrane normal.  Eyes:     Pupils: Pupils are equal, round, and reactive to light.  Neck:     Thyroid: No thyromegaly.  Cardiovascular:     Rate and Rhythm: Normal rate and regular rhythm.     Heart sounds: Normal heart sounds. No murmur heard. Pulmonary:     Effort: Pulmonary effort is normal. No respiratory distress.     Breath sounds: Normal breath sounds. No wheezing.  Abdominal:     General: Bowel sounds are normal. There is no distension.     Palpations: Abdomen is soft.     Tenderness: There is no abdominal tenderness.  Musculoskeletal:        General: No tenderness.     Cervical back: Normal range of motion and neck supple.     Comments: Pain in lumbar with flexion  Skin:    General: Skin is warm and dry.  Neurological:     Mental Status: She is alert and oriented to person, place, and time.     Cranial Nerves: No cranial nerve deficit.     Deep Tendon Reflexes: Reflexes are normal and symmetric.  Psychiatric:        Behavior: Behavior normal.        Thought Content: Thought content normal.        Judgment: Judgment normal.      BP 123/83   Pulse 61   Temp 97.8 F (36.6 C) (Temporal)   Ht $R'4\' 11"'nw$  (1.499 m)   Wt 184 lb 9.6 oz (83.7 kg)   BMI 37.28 kg/m      Assessment & Plan:  SHAMELL HITTLE comes in today with chief complaint of Medical Management of Chronic  Issues   Diagnosis and orders addressed:  1. Chronic left-sided low back pain with sciatica, sciatica laterality unspecified - HYDROcodone-acetaminophen (NORCO/VICODIN) 5-325 MG tablet; Take 1 tablet by mouth every 6 (six) hours as needed for moderate pain.  Dispense: 28 tablet; Refill: 0 - CMP14+EGFR  2. Essential hypertension - CMP14+EGFR  3. Coronary artery disease due to lipid rich plaque - CMP14+EGFR  4. Gastroesophageal reflux disease without esophagitis - CMP14+EGFR  5. Hyperlipidemia associated with type 2 diabetes mellitus (HCC) - CMP14+EGFR  6. Type 2 diabetes mellitus with other specified complication, without long-term current use of insulin (HCC) - Bayer DCA Hb A1c Waived - CMP14+EGFR - Microalbumin / creatinine urine ratio  7. Acquired hypothyroidism - CMP14+EGFR  8. Anxiety, generalized - CMP14+EGFR  9. Obesity, Class III, BMI 40-49.9 (morbid obesity) (HCC) - CMP14+EGFR  10. Insomnia, unspecified type - CMP14+EGFR - traZODone (DESYREL) 50 MG tablet; Take 1-2 tablets (50-100 mg total) by mouth at bedtime.  Dispense: 180 tablet; Refill: 1  11. Need for shingles vaccine - Varicella-zoster vaccine IM (Shingrix) - CMP14+EGFR   Labs pending Health Maintenance reviewed Diet and exercise encouraged  Follow up plan: 3 months    Evelina Dun, FNP

## 2021-05-15 LAB — MICROALBUMIN / CREATININE URINE RATIO
Creatinine, Urine: 123.9 mg/dL
Microalb/Creat Ratio: 10 mg/g creat (ref 0–29)
Microalbumin, Urine: 11.9 ug/mL

## 2021-06-12 ENCOUNTER — Other Ambulatory Visit: Payer: Self-pay | Admitting: Nurse Practitioner

## 2021-06-12 ENCOUNTER — Other Ambulatory Visit: Payer: Self-pay | Admitting: Family

## 2021-06-12 ENCOUNTER — Other Ambulatory Visit (INDEPENDENT_AMBULATORY_CARE_PROVIDER_SITE_OTHER): Payer: Self-pay | Admitting: Gastroenterology

## 2021-06-12 DIAGNOSIS — E785 Hyperlipidemia, unspecified: Secondary | ICD-10-CM

## 2021-06-12 DIAGNOSIS — E1169 Type 2 diabetes mellitus with other specified complication: Secondary | ICD-10-CM

## 2021-06-12 DIAGNOSIS — Z7689 Persons encountering health services in other specified circumstances: Secondary | ICD-10-CM

## 2021-06-12 DIAGNOSIS — E039 Hypothyroidism, unspecified: Secondary | ICD-10-CM

## 2021-06-12 DIAGNOSIS — E119 Type 2 diabetes mellitus without complications: Secondary | ICD-10-CM

## 2021-06-12 DIAGNOSIS — I10 Essential (primary) hypertension: Secondary | ICD-10-CM

## 2021-07-15 DIAGNOSIS — Z0289 Encounter for other administrative examinations: Secondary | ICD-10-CM

## 2021-08-01 ENCOUNTER — Other Ambulatory Visit: Payer: Self-pay

## 2021-08-01 ENCOUNTER — Encounter (INDEPENDENT_AMBULATORY_CARE_PROVIDER_SITE_OTHER): Payer: Self-pay | Admitting: Gastroenterology

## 2021-08-01 ENCOUNTER — Ambulatory Visit (INDEPENDENT_AMBULATORY_CARE_PROVIDER_SITE_OTHER): Payer: 59 | Admitting: Gastroenterology

## 2021-08-01 VITALS — BP 130/73 | HR 59 | Temp 98.7°F | Ht 59.0 in | Wt 185.9 lb

## 2021-08-01 DIAGNOSIS — K219 Gastro-esophageal reflux disease without esophagitis: Secondary | ICD-10-CM | POA: Diagnosis not present

## 2021-08-01 DIAGNOSIS — K633 Ulcer of intestine: Secondary | ICD-10-CM | POA: Diagnosis not present

## 2021-08-01 DIAGNOSIS — K529 Noninfective gastroenteritis and colitis, unspecified: Secondary | ICD-10-CM | POA: Diagnosis not present

## 2021-08-01 MED ORDER — MESALAMINE 1.2 G PO TBEC: 2.4000 g | DELAYED_RELEASE_TABLET | Freq: Every day | ORAL | 3 refills | Status: DC

## 2021-08-01 MED ORDER — PANTOPRAZOLE SODIUM 40 MG PO TBEC: 40.0000 mg | DELAYED_RELEASE_TABLET | Freq: Two times a day (BID) | ORAL | 5 refills | Status: DC

## 2021-08-01 NOTE — Progress Notes (Signed)
Referring Provider: Sharion Balloon, FNP Primary Care Physician:  Sharion Balloon, FNP Primary GI Physician: Jenetta Downer  Chief Complaint  Patient presents with   Gastroesophageal Reflux    1 year follow up on gerd. Pt states med is working well for her and no concerns today. Not much of an appetite. Has 1 -2 BM's daily.    HPI:   Cynthia Mays is a 80 y.o. female with past medical history of depresion, DM, fibromyalgia, GERD, large hiatal hernia with cameron lesions, HLD, HTN, hypothyroidism.   Patient presenting today for follow up of Colonic ulcer and GERD.  Colonic ulcer: incidentally found on Colonoscopy in September of 2021 when she underwent diagnostic TCS for diarrhea. Suspect she could have had an episode of colonic Crohn's disease, She was started on mesalamine 2.4g/day with improvement in her diarrhea and recommendations that she be maintained on 5-ASA indefinitely. She has not had any episodes of diarrhea in quite some time. Having 1-2 BMs per day without any rectal bleeding. She has occasional, Mild abdominal pain, though she feels that it is typically related to something that she ate and reports it resolves on its own. She is tolerating medication without any side effects.   GERD/cameron ulcers r/t large hiatal hernia:currently maintained on pantoprazole $RemoveBeforeD'40mg'mDbtuBEWllZHCf$  BID, doing well on this without any episodes of reflux, no melena or hematochezia. No dysphagia, odynophagia, nausea, vomiting, or early satiety.  She does endorse some weight loss, she is watching what she eats and trying to get some physical activity working outside in her yard. Her appetite is okay. She endorses some ongoing fatigue, however, this is a chronic issue for her. PCP has checked her TSH and basic labs previously without significant findings, Will have labs done again with PCP on 08/13/21.  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 - Tortuous colon. - Non-bleeding internal hemorrhoids. Last Endoscopy:05/04/20- Normal esophagus. 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 - Multiple gastric polyps-fundic gland polyp - Erythematous mucosa in the pylorus. gastric antral mucosa and oxyntic mucosa with mild chronic gastritis and reactive changes, neg H pylori - Normal examined duodenum  Past Medical History:  Diagnosis Date   Complication of anesthesia    Depression    Diabetes mellitus    Fibromyalgia    GERD (gastroesophageal reflux disease)    Hiatal hernia    Hyperlipidemia    Hypertension    Hypothyroidism    Hypothyroidism 07/23/2016   PONV (postoperative nausea and vomiting)     Past Surgical History:  Procedure Laterality Date   APPENDECTOMY  2002   arthroscopy  right 2002, left 2007   bilateral knees   BACK SURGERY  10/2007, 09/2008   x2   BIOPSY  10/02/2016   Procedure: BIOPSY;  Surgeon: Rogene Houston, MD;  [facility];  Service: Endoscopy;;  gastric   BIOPSY  05/04/2020   Procedure: BIOPSY;  Surgeon: Harvel Quale, MD;  Location: [facility];  Service: Gastroenterology;;   [place]   bilateral   [place]   x2   COLONOSCOPY WITH PROPOFOL N/A 05/04/2020   Procedure: COLONOSCOPY WITH PROPOFOL;  Surgeon: Harvel Quale, MD;  [facility]: [facility];  Service:  Gastroenterology;  Laterality: N/A;  11   ESOPHAGOGASTRODUODENOSCOPY N/A 10/02/2016   Procedure: ESOPHAGOGASTRODUODENOSCOPY (EGD);  Surgeon: Rogene Houston, MD;  [facility];  Service: Endoscopy;  Laterality: N/A;  11:15 - moved to 2/1 @ 3:00   ESOPHAGOGASTRODUODENOSCOPY (EGD) WITH PROPOFOL N/A 05/04/2020   Procedure:  ESOPHAGOGASTRODUODENOSCOPY (EGD) WITH PROPOFOL;  Surgeon: Harvel Quale, MD;  [facility]: [facility];  Service: Gastroenterology;  Laterality: N/A;   KNEE SURGERY     both    Current Outpatient Medications  Medication Sig Dispense Refill   Accu-Chek Softclix Lancets lancets USE TO CHECK BLOOD SUGAR UP TO 4 TIMES A DAY AS DIRECTED     blood glucose meter kit and supplies Dispense based on patient and insurance preference. Use up to four times daily as directed. (FOR ICD-10 E10.9, E11.9). 1 each 0   cholecalciferol (VITAMIN D3) 25 MCG (1000 UNIT) tablet Take 1,000 Units by mouth daily.      clobetasol cream (TEMOVATE) 0.07 % APPLY 1 APPLICATION TOPICALLY ONCE DAILY AS NEEDED FOR  RASH 30 g 11   diazepam (VALIUM) 5 MG tablet Take 1 tablet (5 mg total) by mouth every 6 (six) hours as needed for anxiety or muscle spasms. 30 tablet 2   famotidine (PEPCID) 40 MG tablet TAKE 1 TABLET BY MOUTH AT BEDTIME 90 tablet 0   FARXIGA 5 MG TABS tablet TAKE 1 TABLET BY MOUTH ONCE DAILY BEFORE BREAKFAST 90 tablet 0   fexofenadine (ALLEGRA) 180 MG tablet Take 180 mg by mouth daily.     fluticasone (FLONASE) 50 MCG/ACT nasal spray Place 2 sprays into the nose daily.     HYDROcodone-acetaminophen (NORCO/VICODIN) 5-325 MG tablet Take 1 tablet by mouth every 6 (six) hours as needed for moderate pain. 28 tablet 0   levothyroxine (SYNTHROID) 112 MCG tablet TAKE 1 TABLET BY MOUTH ONCE DAILY BEFORE BREAKFAST 90 tablet 0   mesalamine (LIALDA) 1.2 g EC tablet TAKE 2 TABLETS BY MOUTH ONCE DAILY WITH BREAKFAST 180 tablet 3   Multiple Vitamins-Minerals (MULTIVITAMINS THER. W/MINERALS) TABS Take 1 tablet by mouth daily.     NIFEdipine (PROCARDIA-XL/NIFEDICAL-XL) 30 MG 24 hr tablet Take 1 tablet by mouth once daily 90 tablet 0   ondansetron (ZOFRAN ODT) 8 MG disintegrating tablet Take 1 tablet (8 mg total) by mouth every 8 (eight) hours as needed for nausea or vomiting. 20 tablet 0   pantoprazole (PROTONIX) 40 MG  tablet Take 1 tablet (40 mg total) by mouth 2 (two) times daily. NEEDS OFFICE VISIT 60 tablet 0   Probiotic Product (PHILLIPS COLON HEALTH) CAPS Take 1 capsule by mouth daily.      rosuvastatin (CRESTOR) 5 MG tablet Take 1 tablet by mouth once daily 90 tablet 0   sertraline (ZOLOFT) 50 MG tablet TAKE 1 & 1/2 (ONE & ONE-HALF) TABLETS BY MOUTH ONCE DAILY 135 tablet 0   traZODone (DESYREL) 50 MG tablet Take 1-2 tablets (50-100 mg total) by mouth at bedtime. 180 tablet 1   No current facility-administered medications for this visit.    Allergies as of 08/01/2021 - Review Complete 08/01/2021  Allergen Reaction Noted   Codeine Other (See Comments) 07/11/2011   Penicillins Rash 07/11/2011   Pravastatin  09/26/2016    Family History  Problem Relation Age of Onset   Anesthesia problems Mother    CAD Mother 46   Cancer Mother        Pancreatic cancer   Hypertension Father    Suicidality Father  gunshot   Deafness Sister    Heart disease Brother    Suicidality Son    Cancer Sister        female    Suicidality Brother    Hypotension Neg Hx    Pseudochol deficiency Neg Hx    Malignant hyperthermia Neg Hx     Social History   Socioeconomic History   Marital status: Widowed    Spouse name: Not on file   Number of children: 4   Years of education: 68   Highest education level: 12th grade  Occupational History    Employer: RETIRED  Tobacco Use   Smoking status: Former    Packs/day: 1.00    Years: 43.00    Pack years: 43.00    Types: Cigarettes    Quit date: 05/02/1970    Years since quitting: 51.2   Smokeless tobacco: Never   Tobacco comments:    > 20 years quit  Vaping Use   Vaping Use: Never used  Substance and Sexual Activity   Alcohol use: No    Comment: occasionally wine   Drug use: No   Sexual activity: Yes    Birth control/protection: Post-menopausal  Other Topics Concern   Not on file  Social History Narrative   Lives alone.  Widowed    1 dog, 2 cats    2 daughters, 1 local   Enjoys dancing and sewing   Social Determinants of Radio broadcast assistant Strain: Low Risk    Difficulty of Paying Living Expenses: Not hard at all  Food Insecurity: No Food Insecurity   Worried About Charity fundraiser in the Last Year: Never true   Arboriculturist in the Last Year: Never true  Transportation Needs: No Transportation Needs   Lack of Transportation (Medical): No   Lack of Transportation (Non-Medical): No  Physical Activity: Insufficiently Active   Days of Exercise per Week: 7 days   Minutes of Exercise per Session: 20 min  Stress: No Stress Concern Present   Feeling of Stress : Not at all  Social Connections: Moderately Integrated   Frequency of Communication with Friends and Family: More than three times a week   Frequency of Social Gatherings with Friends and Family: Once a week   Attends Religious Services: More than 4 times per year   Active Member of [employer] or Organizations: Yes   Attends Archivist Meetings: More than 4 times per year   Marital Status: Widowed   Review of systems General: negative night sweats, fever, chills, weight loss +fatigue Neck: Negative for lumps, goiter, pain and significant neck swelling Resp: Negative for cough, wheezing, dyspnea at rest CV: Negative for chest pain, leg swelling, palpitations, orthopnea GI: denies melena, hematochezia, nausea, vomiting, diarrhea, constipation, dysphagia, odyonophagia, early satiety or unintentional weight loss.  MSK: Negative for joint pain or swelling, back pain, and muscle pain. Derm: Negative for itching or rash Psych: Denies depression, anxiety, memory loss, confusion. No homicidal or suicidal ideation.  Heme: Negative for prolonged bleeding, bruising easily, and swollen nodes. Endocrine: Negative for cold or heat intolerance, polyuria, polydipsia and goiter. Neuro: negative for tremor, gait imbalance, syncope and seizures. The remainder of the review of  systems is noncontributory.  Physical Exam: BP 130/73 (BP Location: Left Arm, Patient Position: Sitting, Cuff Size: Large)   Pulse (!) 59   Temp 98.7 F (37.1 C) (Oral)   Ht $R'4\' 11"'Ws$  (1.499 m)   Wt 185 lb 14.4 oz (84.3  kg)   BMI 37.55 kg/m  General:   Alert and oriented. No distress noted. Pleasant and cooperative.  Head:  Normocephalic and atraumatic. Eyes:  Conjuctiva clear without scleral icterus. Mouth:  Oral mucosa pink and moist. Good dentition. No lesions. Heart: Normal rate and rhythm, s1 and s2 heart sounds present.  Lungs: Clear lung sounds in all lobes. Respirations equal and unlabored. Abdomen:  +BS, soft, non-tender and non-distended. No rebound or guarding. No HSM or masses noted. Derm: No palmar erythema or jaundice, no pallor, skin color WNL Msk:  Symmetrical without gross deformities. Normal posture. Extremities:  Without edema. Neurologic:  Alert and  oriented x4 Psych:  Alert and cooperative. Normal mood and affect.  Invalid input(s): 6 MONTHS   ASSESSMENT: EMER ONNEN is a 80 y.o. female presenting today for yearly follow up of GERD and previous colonic ulcer.  GERD/cameron lesions seem well maintained on pantoprazole $RemoveBeforeD'40mg'lolpbFpQyXXPdA$  BID. She has had no episodes of reflux, she denies dysphagia, odynophagia, early satiety, melena, or rectal bleeding.   She does complain of fatigue, however, she reports this has been a chronic issue. Last hgb of 12.9 was in July, she has not had any over rectal bleeding and without pallor during exam, she is due for repeat CMP and CBC, however, she is supposed to have labs done at PCP visit on 12/13 so I will hold off on these today. TSH earlier this year was WNL. She may benefit from having her vitamin D and B12 levels checked for further evaluation of her fatigue. I will defer to her PCP for ongoing management of this, unless she is found to have new onset anemia as the cause of her fatigue, in which case she would need further GI  evaluation.   She is maintained on 2.4g Lialda daily for previous colonic incidentally found on colonoscopy in September 2021. She has not had any episodes of diarrhea in a very long time. She has 1-2 BMs per day without any rectal bleeding. Very occasional abdominal pain, however, she feels this only occurs in relation to something she may have eaten that did not agree with her. Labs will be checked by PCP on 12/13.   She will let me know if she develops any new or worsening GI symptoms.    PLAN:  Continue Lialda 2.4 g daily 2. Continue PPI BID 3. Will hold off on checking CBC and CMP today as patient has upcoming labs with PCP on 12/13 4. Patient may benefit from Vitamin D and Vitamin B12 levels being checked for ongoing fatigue, will defer to PCP for this.  Follow Up: 1 year  Haelyn Forgey L. Alver Sorrow, MSN, APRN, AGNP-C Adult-Gerontology Nurse Practitioner [facility]

## 2021-08-01 NOTE — Patient Instructions (Signed)
Please continue your pantoprazole 40mg  twice a daily Continue your Lialda 1.2g, two pills with breakfast. Please let me know if you develop any new or worsening GI symptoms. I will hold off on labs today as you have upcoming labs with your PCP.  We will plan to have you follow up in 1 year

## 2021-08-05 ENCOUNTER — Ambulatory Visit (INDEPENDENT_AMBULATORY_CARE_PROVIDER_SITE_OTHER): Payer: 59 | Admitting: Family Medicine

## 2021-08-05 ENCOUNTER — Encounter: Payer: Self-pay | Admitting: Family Medicine

## 2021-08-05 DIAGNOSIS — J0100 Acute maxillary sinusitis, unspecified: Secondary | ICD-10-CM

## 2021-08-05 MED ORDER — SULFAMETHOXAZOLE-TRIMETHOPRIM 800-160 MG PO TABS: 1.0000 | ORAL_TABLET | Freq: Two times a day (BID) | ORAL | 0 refills | Status: DC

## 2021-08-05 NOTE — Progress Notes (Signed)
Subjective:    Patient ID: Cynthia Mays, female    DOB: December 03, 1940, 80 y.o.   MRN: [id number]   HPI: Cynthia Mays is a 80 y.o. female presenting for congestion. No fever. Coughing. Makes her off balance. Sore throat has dissipated. Sneezing in the morning. Cough is dry. Feeling a little dyspneic. Taking fluids well.    Depression screen [facility] 2/9 05/14/2021 03/18/2021 12/17/2020 11/29/2020 09/11/2020  Decreased Interest 0 0 0 0 0  Down, Depressed, Hopeless 0 0 0 0 0  PHQ - 2 Score 0 0 0 0 0  Altered sleeping 3 - 0 - -  Tired, decreased energy 3 - 0 - -  Change in appetite 0 - 0 - -  Feeling bad or failure about yourself  0 - 0 - -  Trouble concentrating 0 - 0 - -  Moving slowly or fidgety/restless 0 - 0 - -  Suicidal thoughts 0 - 0 - -  PHQ-9 Score 6 - 0 - -  Difficult doing work/chores Somewhat difficult - - - -     Relevant past medical, surgical, family and social history reviewed and updated as indicated.  Interim medical history since our last visit reviewed. Allergies and medications reviewed and updated.  ROS:  Review of Systems  Constitutional:  Negative for activity change, appetite change, chills and fever.  HENT:  Positive for congestion, rhinorrhea and sinus pressure. Negative for ear pain, hearing loss, nosebleeds, postnasal drip, sneezing and trouble swallowing.   Respiratory:  Positive for cough. Negative for chest tightness.   Cardiovascular:  Negative for chest pain and palpitations.  Skin:  Negative for rash.    Social History   Tobacco Use  Smoking Status Former   Packs/day: 1.00   Years: 43.00   Pack years: 43.00   Types: Cigarettes   Quit date: 05/02/1970   Years since quitting: 51.2  Smokeless Tobacco Never  Tobacco Comments   > 20 years quit       Objective:     Wt Readings from Last 3 Encounters:  08/01/21 185 lb 14.4 oz (84.3 kg)  05/14/21 184 lb 9.6 oz (83.7 kg)  03/18/21 185 lb (83.9 kg)     Exam deferred. Pt. Harboring  due to COVID 19. Phone visit performed.   Assessment & Plan:   1. Acute maxillary sinusitis, recurrence not specified     Meds ordered this encounter  Medications   sulfamethoxazole-trimethoprim (BACTRIM DS) 800-160 MG tablet    Sig: Take 1 tablet by mouth 2 (two) times daily. Until gone, for infection    Dispense:  20 tablet    Refill:  0     No orders of the defined types were placed in this encounter.     Diagnoses and all orders for this visit:  Acute maxillary sinusitis, recurrence not specified  Other orders -     sulfamethoxazole-trimethoprim (BACTRIM DS) 800-160 MG tablet; Take 1 tablet by mouth 2 (two) times daily. Until gone, for infection   Virtual Visit via telephone Note  I discussed the limitations, risks, security and privacy concerns of performing an evaluation and management service by telephone and the availability of in person appointments. The patient was identified with two identifiers. Pt.expressed understanding and agreed to proceed. Pt. Is at home. Dr. Darlyn Read is in his office.  Follow Up Instructions:   I discussed the assessment and treatment plan with the patient. The patient was provided an opportunity to ask questions and all were answered.  The patient agreed with the plan and demonstrated an understanding of the instructions.   The patient was advised to call back or seek an in-person evaluation if the symptoms worsen or if the condition fails to improve as anticipated.   Total minutes including chart review and phone contact time: 13   Follow up plan: Return if symptoms worsen or fail to improve.  Mechele Claude, MD [place] [facility]

## 2021-08-13 ENCOUNTER — Ambulatory Visit (INDEPENDENT_AMBULATORY_CARE_PROVIDER_SITE_OTHER): Payer: 59 | Admitting: Family

## 2021-08-13 ENCOUNTER — Encounter: Payer: Self-pay | Admitting: Family

## 2021-08-13 VITALS — BP 117/75 | HR 77 | Temp 97.1°F | Ht 59.0 in | Wt 187.0 lb

## 2021-08-13 DIAGNOSIS — E1169 Type 2 diabetes mellitus with other specified complication: Secondary | ICD-10-CM

## 2021-08-13 DIAGNOSIS — M5440 Lumbago with sciatica, unspecified side: Secondary | ICD-10-CM

## 2021-08-13 DIAGNOSIS — I2583 Coronary atherosclerosis due to lipid rich plaque: Secondary | ICD-10-CM

## 2021-08-13 DIAGNOSIS — G8929 Other chronic pain: Secondary | ICD-10-CM

## 2021-08-13 DIAGNOSIS — G4700 Insomnia, unspecified: Secondary | ICD-10-CM

## 2021-08-13 DIAGNOSIS — F411 Generalized anxiety disorder: Secondary | ICD-10-CM

## 2021-08-13 DIAGNOSIS — I2510 Atherosclerotic heart disease of native coronary artery without angina pectoris: Secondary | ICD-10-CM

## 2021-08-13 DIAGNOSIS — I10 Essential (primary) hypertension: Secondary | ICD-10-CM | POA: Diagnosis not present

## 2021-08-13 DIAGNOSIS — Z79899 Other long term (current) drug therapy: Secondary | ICD-10-CM

## 2021-08-13 DIAGNOSIS — K219 Gastro-esophageal reflux disease without esophagitis: Secondary | ICD-10-CM | POA: Diagnosis not present

## 2021-08-13 DIAGNOSIS — E039 Hypothyroidism, unspecified: Secondary | ICD-10-CM

## 2021-08-13 DIAGNOSIS — E785 Hyperlipidemia, unspecified: Secondary | ICD-10-CM

## 2021-08-13 MED ORDER — DIAZEPAM 5 MG PO TABS: 5.0000 mg | ORAL_TABLET | Freq: Four times a day (QID) | ORAL | 2 refills | Status: DC | PRN

## 2021-08-13 NOTE — Progress Notes (Signed)
Subjective:    Patient ID: Cynthia Mays, female    DOB: [birth year], 80 y.o.   MRN: [id number]  Chief Complaint  Patient presents with   Medical Management of Chronic Issues   PT presents to the office today for chronic follow up. She is morbid obese with a BMI of 37 and DM and HTN.  Hypertension This is a chronic problem. The current episode started more than 1 year ago. The problem has been resolved since onset. The problem is controlled. Associated symptoms include malaise/fatigue. Pertinent negatives include no blurred vision, peripheral edema or shortness of breath. Risk factors for coronary artery disease include dyslipidemia, obesity and sedentary lifestyle. The current treatment provides moderate improvement. Identifiable causes of hypertension include a thyroid problem.  Gastroesophageal Reflux She complains of belching and heartburn. This is a chronic problem. The current episode started more than 1 year ago. The problem occurs occasionally. Associated symptoms include fatigue. She has tried a PPI for the symptoms. The treatment provided moderate relief.  Thyroid Problem Presents for follow-up visit. Symptoms include anxiety and fatigue. Patient reports no constipation, depressed mood or dry skin. The symptoms have been stable. Her past medical history is significant for hyperlipidemia.  Hyperlipidemia This is a chronic problem. The current episode started more than 1 year ago. Exacerbating diseases include obesity. Pertinent negatives include no shortness of breath. Current antihyperlipidemic treatment includes statins. The current treatment provides moderate improvement of lipids. Risk factors for coronary artery disease include dyslipidemia, diabetes mellitus, hypertension, a sedentary lifestyle and post-menopausal.  Diabetes She presents for her follow-up diabetic visit. She has type 2 diabetes mellitus. Hypoglycemia symptoms include nervousness/anxiousness. Associated symptoms  include fatigue. Pertinent negatives for diabetes include no blurred vision and no foot paresthesias. Symptoms are stable. Risk factors for coronary artery disease include dyslipidemia, diabetes mellitus, hypertension, sedentary lifestyle and post-menopausal. (Does not check glucose at home)  Back Pain This is a chronic problem. The current episode started more than 1 year ago. The problem occurs intermittently. The problem has been waxing and waning since onset. The pain is present in the lumbar spine. The pain is at a severity of 5/10. The pain is moderate.  Insomnia Primary symptoms: difficulty falling asleep, frequent awakening, malaise/fatigue.   The current episode started more than one year. The onset quality is gradual.  Anxiety Presents for follow-up visit. Symptoms include insomnia, irritability and nervous/anxious behavior. Patient reports no depressed mood or shortness of breath. Symptoms occur most days. The severity of symptoms is moderate.       Review of Systems  Constitutional:  Positive for fatigue, irritability and malaise/fatigue.  Eyes:  Negative for blurred vision.  Respiratory:  Negative for shortness of breath.   Gastrointestinal:  Positive for heartburn. Negative for constipation.  Musculoskeletal:  Positive for back pain.  Psychiatric/Behavioral:  The patient is nervous/anxious and has insomnia.   All other systems reviewed and are negative.     Objective:   Physical Exam Vitals reviewed.  Constitutional:      General: She is not in acute distress.    Appearance: She is well-developed. She is obese.  HENT:     Head: Normocephalic and atraumatic.     Right Ear: Tympanic membrane normal.     Left Ear: Tympanic membrane normal.  Eyes:     Pupils: Pupils are equal, round, and reactive to light.  Neck:     Thyroid: No thyromegaly.  Cardiovascular:     Rate and Rhythm: Normal rate and regular  rhythm.     Heart sounds: Normal heart sounds. No murmur  heard. Pulmonary:     Effort: Pulmonary effort is normal. No respiratory distress.     Breath sounds: Normal breath sounds. No wheezing.  Abdominal:     General: Bowel sounds are normal. There is no distension.     Palpations: Abdomen is soft.     Tenderness: There is no abdominal tenderness.  Musculoskeletal:        General: No tenderness. Normal range of motion.     Cervical back: Normal range of motion and neck supple.  Skin:    General: Skin is warm and dry.  Neurological:     Mental Status: She is alert and oriented to person, place, and time.     Cranial Nerves: No cranial nerve deficit.     Deep Tendon Reflexes: Reflexes are normal and symmetric.  Psychiatric:        Behavior: Behavior normal.        Thought Content: Thought content normal.        Judgment: Judgment normal.      BP 117/75    Pulse 77    Temp (!) 97.1 F (36.2 C) (Temporal)    Ht 4\' 11"  (1.499 m)    Wt 187 lb (84.8 kg)    BMI 37.77 kg/m      Assessment & Plan:  NICKY MILHOUSE comes in today with chief complaint of Medical Management of Chronic Issues   Diagnosis and orders addressed:  1. Anxiety, generalized - diazepam (VALIUM) 5 MG tablet; Take 1 tablet (5 mg total) by mouth every 6 (six) hours as needed for anxiety or muscle spasms.  Dispense: 30 tablet; Refill: 2  2. Essential hypertension  3. Coronary artery disease due to lipid rich plaque  4. Gastroesophageal reflux disease without esophagitis  5. Hyperlipidemia associated with type 2 diabetes mellitus (HCC)  6. Acquired hypothyroidism  7. Type 2 diabetes mellitus with other specified complication, without long-term current use of insulin (HCC)  8. Chronic left-sided low back pain with sciatica, sciatica laterality unspecified  9. Insomnia, unspecified type  10. Obesity, Class III, BMI 40-49.9 (morbid obesity) (HCC)  11. Chronic use of benzodiazepine for therapeutic purpose - diazepam (VALIUM) 5 MG tablet; Take 1 tablet (5 mg  total) by mouth every 6 (six) hours as needed for anxiety or muscle spasms.  Dispense: 30 tablet; Refill: 2   Patient reviewed in [place] controlled database, no flags noted. Contract and drug screen are up to date.  Health Maintenance reviewed Diet and exercise encouraged  Follow up plan: 3 months    [name], FNP

## 2021-08-13 NOTE — Patient Instructions (Signed)
Health Maintenance After Age 79 After age 79, you are at a higher risk for certain long-term diseases and infections as well as injuries from falls. Falls are a major cause of broken bones and head injuries in people who are older than age 79. Getting regular preventive care can help to keep you healthy and well. Preventive care includes getting regular testing and making lifestyle changes as recommended by your health care provider. Talk with your health care provider about: Which screenings and tests you should have. A screening is a test that checks for a disease when you have no symptoms. A diet and exercise plan that is right for you. What should I know about screenings and tests to prevent falls? Screening and testing are the best ways to find a health problem early. Early diagnosis and treatment give you the best chance of managing medical conditions that are common after age 79. Certain conditions and lifestyle choices may make you more likely to have a fall. Your health care provider may recommend: Regular vision checks. Poor vision and conditions such as cataracts can make you more likely to have a fall. If you wear glasses, make sure to get your prescription updated if your vision changes. Medicine review. Work with your health care provider to regularly review all of the medicines you are taking, including over-the-counter medicines. Ask your health care provider about any side effects that may make you more likely to have a fall. Tell your health care provider if any medicines that you take make you feel dizzy or sleepy. Strength and balance checks. Your health care provider may recommend certain tests to check your strength and balance while standing, walking, or changing positions. Foot health exam. Foot pain and numbness, as well as not wearing proper footwear, can make you more likely to have a fall. Screenings, including: Osteoporosis screening. Osteoporosis is a condition that causes  the bones to get weaker and break more easily. Blood pressure screening. Blood pressure changes and medicines to control blood pressure can make you feel dizzy. Depression screening. You may be more likely to have a fall if you have a fear of falling, feel depressed, or feel unable to do activities that you used to do. Alcohol use screening. Using too much alcohol can affect your balance and may make you more likely to have a fall. Follow these instructions at home: Lifestyle Do not drink alcohol if: Your health care provider tells you not to drink. If you drink alcohol: Limit how much you have to: 0-1 drink a day for women. 0-2 drinks a day for men. Know how much alcohol is in your drink. In the [place] one drink equals one 12 oz bottle of beer (355 mL), one 5 oz glass of wine (148 mL), or one 1 oz glass of hard liquor (44 mL). Do not use any products that contain nicotine or tobacco. These products include cigarettes, chewing tobacco, and vaping devices, such as e-cigarettes. If you need help quitting, ask your health care provider. Activity  Follow a regular exercise program to stay fit. This will help you maintain your balance. Ask your health care provider what types of exercise are appropriate for you. If you need a cane or walker, use it as recommended by your health care provider. Wear supportive shoes that have nonskid soles. Safety  Remove any tripping hazards, such as rugs, cords, and clutter. Install safety equipment such as grab bars in bathrooms and safety rails on stairs. Keep rooms and walkways   well-lit. General instructions Talk with your health care provider about your risks for falling. Tell your health care provider if: You fall. Be sure to tell your health care provider about all falls, even ones that seem minor. You feel dizzy, tiredness (fatigue), or off-balance. Take over-the-counter and prescription medicines only as told by your health care provider. These include  supplements. Eat a healthy diet and maintain a healthy weight. A healthy diet includes low-fat dairy products, low-fat (lean) meats, and fiber from whole grains, beans, and lots of fruits and vegetables. Stay current with your vaccines. Schedule regular health, dental, and eye exams. Summary Having a healthy lifestyle and getting preventive care can help to protect your health and wellness after age 79. Screening and testing are the best way to find a health problem early and help you avoid having a fall. Early diagnosis and treatment give you the best chance for managing medical conditions that are more common for people who are older than age 79. Falls are a major cause of broken bones and head injuries in people who are older than age 79. Take precautions to prevent a fall at home. Work with your health care provider to learn what changes you can make to improve your health and wellness and to prevent falls. This information is not intended to replace advice given to you by your health care provider. Make sure you discuss any questions you have with your health care provider. Document Revised: 01/07/2021 Document Reviewed: 01/07/2021 Elsevier Patient Education  2022 [employer]  

## 2021-09-11 DIAGNOSIS — M25561 Pain in right knee: Secondary | ICD-10-CM | POA: Diagnosis not present

## 2021-09-11 DIAGNOSIS — M170 Bilateral primary osteoarthritis of knee: Secondary | ICD-10-CM | POA: Diagnosis not present

## 2021-09-11 DIAGNOSIS — M25562 Pain in left knee: Secondary | ICD-10-CM | POA: Diagnosis not present

## 2021-09-13 ENCOUNTER — Other Ambulatory Visit: Payer: Self-pay

## 2021-09-13 ENCOUNTER — Ambulatory Visit (HOSPITAL_COMMUNITY)
Admission: RE | Admit: 2021-09-13 | Discharge: 2021-09-13 | Disposition: A | Discharge status: Home / Self Care | Payer: Medicare Other | Source: Ambulatory Visit | Attending: Family | Admitting: Family

## 2021-09-13 ENCOUNTER — Other Ambulatory Visit: Payer: Self-pay | Admitting: Family

## 2021-09-13 DIAGNOSIS — E279 Disorder of adrenal gland, unspecified: Secondary | ICD-10-CM | POA: Diagnosis not present

## 2021-09-16 ENCOUNTER — Other Ambulatory Visit: Payer: Self-pay | Admitting: Family

## 2021-09-16 DIAGNOSIS — N83299 Other ovarian cyst, unspecified side: Secondary | ICD-10-CM

## 2021-09-19 ENCOUNTER — Ambulatory Visit (INDEPENDENT_AMBULATORY_CARE_PROVIDER_SITE_OTHER): Payer: Commercial Managed Care - HMO | Admitting: Family

## 2021-09-19 ENCOUNTER — Encounter: Payer: Self-pay | Admitting: Family

## 2021-09-19 VITALS — BP 130/70 | HR 79 | Temp 98.1°F | Ht 59.0 in | Wt 185.0 lb

## 2021-09-19 DIAGNOSIS — M5440 Lumbago with sciatica, unspecified side: Secondary | ICD-10-CM | POA: Diagnosis not present

## 2021-09-19 DIAGNOSIS — G8929 Other chronic pain: Secondary | ICD-10-CM | POA: Diagnosis not present

## 2021-09-19 DIAGNOSIS — R1030 Lower abdominal pain, unspecified: Secondary | ICD-10-CM

## 2021-09-19 DIAGNOSIS — F411 Generalized anxiety disorder: Secondary | ICD-10-CM

## 2021-09-19 DIAGNOSIS — Z79899 Other long term (current) drug therapy: Secondary | ICD-10-CM

## 2021-09-19 DIAGNOSIS — R109 Unspecified abdominal pain: Secondary | ICD-10-CM | POA: Diagnosis not present

## 2021-09-19 MED ORDER — DIAZEPAM 5 MG PO TABS: 5.0000 mg | ORAL_TABLET | Freq: Four times a day (QID) | ORAL | 2 refills | Status: DC | PRN

## 2021-09-19 MED ORDER — ONDANSETRON 8 MG PO TBDP: 8.0000 mg | ORAL_TABLET | Freq: Three times a day (TID) | ORAL | 0 refills | Status: DC | PRN

## 2021-09-19 MED ORDER — HYDROCODONE-ACETAMINOPHEN 5-325 MG PO TABS: 1.0000 | ORAL_TABLET | Freq: Four times a day (QID) | ORAL | 0 refills | Status: DC | PRN

## 2021-09-19 NOTE — Progress Notes (Signed)
Subjective:    Patient ID: Cynthia Mays, female    DOB: 1940/10/23, 81 y.o.   MRN: [id number]  Chief Complaint  Patient presents with   Abdominal Pain    Started after her last U/S   PT presents to the office today with bilateral abdominal pain that started 09/13/21.  Abdominal Pain This is a new problem. The current episode started in the past 7 days. The onset quality is gradual. The problem occurs constantly. The problem has been gradually improving. The pain is located in the LUQ and RUQ. The pain is at a severity of 5/10. The pain is mild. The quality of the pain is aching. The abdominal pain does not radiate. Associated symptoms include diarrhea and nausea. Pertinent negatives include no belching, constipation, fever, flatus, frequency, headaches, hematochezia, hematuria, myalgias or vomiting. The pain is aggravated by movement. The pain is relieved by Movement and palpation. She has tried nothing for the symptoms. The treatment provided mild relief.     Review of Systems  Constitutional:  Negative for fever.  Gastrointestinal:  Positive for abdominal pain, diarrhea and nausea. Negative for constipation, flatus, hematochezia and vomiting.  Genitourinary:  Negative for frequency and hematuria.  Musculoskeletal:  Negative for myalgias.  Neurological:  Negative for headaches.  All other systems reviewed and are negative.     Objective:   Physical Exam Vitals reviewed.  Constitutional:      General: She is not in acute distress.    Appearance: She is well-developed.  HENT:     Head: Normocephalic and atraumatic.     Right Ear: External ear normal.  Eyes:     Pupils: Pupils are equal, round, and reactive to light.  Neck:     Thyroid: No thyromegaly.  Cardiovascular:     Rate and Rhythm: Normal rate and regular rhythm.     Heart sounds: Normal heart sounds. No murmur heard. Pulmonary:     Effort: Pulmonary effort is normal. No respiratory distress.     Breath sounds:  Normal breath sounds. No wheezing.  Abdominal:     General: Bowel sounds are normal. There is no distension.     Palpations: Abdomen is soft.     Tenderness: There is abdominal tenderness in the right lower quadrant and left lower quadrant. There is guarding and rebound.  Musculoskeletal:        General: No tenderness. Normal range of motion.     Cervical back: Normal range of motion and neck supple.  Skin:    General: Skin is warm and dry.  Neurological:     Mental Status: She is alert and oriented to person, place, and time.     Cranial Nerves: No cranial nerve deficit.     Deep Tendon Reflexes: Reflexes are normal and symmetric.  Psychiatric:        Behavior: Behavior normal.        Thought Content: Thought content normal.        Judgment: Judgment normal.     BP 130/70    Pulse 79    Temp 98.1 F (36.7 C) (Oral)    Ht $R'4\' 11"'tn$  (1.499 m)    Wt 185 lb (83.9 kg)    BMI 37.37 kg/m       Assessment & Plan:  Cynthia Mays comes in today with chief complaint of Abdominal Pain (Started after her last U/S)   Diagnosis and orders addressed:  1. Abdominal pain, unspecified abdominal location - CBC with Differential/Platelet -  CT Abdomen Pelvis W Contrast; Future - BMP8+EGFR  2. Lower abdominal pain - CBC with Differential/Platelet - CT Abdomen Pelvis W Contrast; Future - BMP8+EGFR - HYDROcodone-acetaminophen (NORCO/VICODIN) 5-325 MG tablet; Take 1 tablet by mouth every 6 (six) hours as needed for moderate pain.  Dispense: 28 tablet; Refill: 0  3. Chronic left-sided low back pain with sciatica, sciatica laterality unspecified - HYDROcodone-acetaminophen (NORCO/VICODIN) 5-325 MG tablet; Take 1 tablet by mouth every 6 (six) hours as needed for moderate pain.  Dispense: 28 tablet; Refill: 0  4. Anxiety, generalized - diazepam (VALIUM) 5 MG tablet; Take 1 tablet (5 mg total) by mouth every 6 (six) hours as needed for anxiety or muscle spasms.  Dispense: 30 tablet; Refill:  2  5. Chronic use of benzodiazepine for therapeutic purpose - diazepam (VALIUM) 5 MG tablet; Take 1 tablet (5 mg total) by mouth every 6 (six) hours as needed for anxiety or muscle spasms.  Dispense: 30 tablet; Refill: 2  Given pain will send for CT scan Do not eat or drink until cleared to do so.  Rest Follow up if symptoms worsen or do not improve    Evelina Dun, FNP

## 2021-09-19 NOTE — Patient Instructions (Signed)

## 2021-09-20 ENCOUNTER — Ambulatory Visit (HOSPITAL_COMMUNITY)
Admission: RE | Admit: 2021-09-20 | Discharge: 2021-09-20 | Disposition: A | Discharge status: Home / Self Care | Payer: Medicare Other | Source: Ambulatory Visit | Attending: Family | Admitting: Family

## 2021-09-20 ENCOUNTER — Other Ambulatory Visit: Payer: Self-pay

## 2021-09-20 ENCOUNTER — Other Ambulatory Visit: Payer: Self-pay | Admitting: Family

## 2021-09-20 DIAGNOSIS — R109 Unspecified abdominal pain: Secondary | ICD-10-CM

## 2021-09-20 DIAGNOSIS — R1030 Lower abdominal pain, unspecified: Secondary | ICD-10-CM

## 2021-09-20 DIAGNOSIS — K5730 Diverticulosis of large intestine without perforation or abscess without bleeding: Secondary | ICD-10-CM | POA: Diagnosis not present

## 2021-09-20 DIAGNOSIS — K449 Diaphragmatic hernia without obstruction or gangrene: Secondary | ICD-10-CM | POA: Diagnosis not present

## 2021-09-20 DIAGNOSIS — G8929 Other chronic pain: Secondary | ICD-10-CM

## 2021-09-20 LAB — CBC WITH DIFFERENTIAL/PLATELET
Basophils Absolute: 0.1 10*3/uL (ref 0.0–0.2)
Basos: 1 %
EOS (ABSOLUTE): 0.2 10*3/uL (ref 0.0–0.4)
Eos: 3 %
Hematocrit: 41.5 % (ref 34.0–46.6)
Hemoglobin: 13.6 g/dL (ref 11.1–15.9)
Immature Grans (Abs): 0 10*3/uL (ref 0.0–0.1)
Immature Granulocytes: 0 %
Lymphocytes Absolute: 1.9 10*3/uL (ref 0.7–3.1)
Lymphs: 35 %
MCH: 26.9 pg (ref 26.6–33.0)
MCHC: 32.8 g/dL (ref 31.5–35.7)
MCV: 82 fL (ref 79–97)
Monocytes Absolute: 0.5 10*3/uL (ref 0.1–0.9)
Monocytes: 8 %
Neutrophils Absolute: 2.9 10*3/uL (ref 1.4–7.0)
Neutrophils: 53 %
Platelets: 189 10*3/uL (ref 150–450)
RBC: 5.06 x10E6/uL (ref 3.77–5.28)
RDW: 15 % (ref 11.7–15.4)
WBC: 5.6 10*3/uL (ref 3.4–10.8)

## 2021-09-20 LAB — BMP8+EGFR
BUN/Creatinine Ratio: 13 (ref 12–28)
BUN: 15 mg/dL (ref 8–27)
CO2: 22 mmol/L (ref 20–29)
Calcium: 9.4 mg/dL (ref 8.7–10.3)
Chloride: 107 mmol/L — ABNORMAL HIGH (ref 96–106)
Creatinine, Ser: 1.14 mg/dL — ABNORMAL HIGH (ref 0.57–1.00)
Glucose: 114 mg/dL — ABNORMAL HIGH (ref 70–99)
Potassium: 4.6 mmol/L (ref 3.5–5.2)
Sodium: 145 mmol/L — ABNORMAL HIGH (ref 134–144)
eGFR: 49 mL/min/{1.73_m2} — ABNORMAL LOW (ref >59)

## 2021-09-20 MED ORDER — HYDROCODONE-ACETAMINOPHEN 5-325 MG PO TABS: 1.0000 | ORAL_TABLET | Freq: Four times a day (QID) | ORAL | 0 refills | Status: DC | PRN

## 2021-09-20 MED ORDER — IOHEXOL 300 MG/ML  SOLN
100.0000 mL | Freq: Once | INTRAMUSCULAR | Status: AC | PRN
  Administered 2021-09-20: 100 mL via INTRAVENOUS

## 2021-10-01 ENCOUNTER — Encounter: Payer: Self-pay | Admitting: Obstetrics & Gynecology

## 2021-10-01 ENCOUNTER — Other Ambulatory Visit: Payer: Self-pay

## 2021-10-01 ENCOUNTER — Ambulatory Visit (INDEPENDENT_AMBULATORY_CARE_PROVIDER_SITE_OTHER): Payer: Medicare Other | Admitting: Obstetrics & Gynecology

## 2021-10-01 VITALS — BP 143/85 | HR 63

## 2021-10-01 DIAGNOSIS — N838 Other noninflammatory disorders of ovary, fallopian tube and broad ligament: Secondary | ICD-10-CM | POA: Diagnosis not present

## 2021-10-01 MED ORDER — CLOBETASOL PROPIONATE 0.05 % EX CREA: TOPICAL_CREAM | CUTANEOUS | 11 refills | Status: DC

## 2021-10-01 NOTE — Progress Notes (Signed)
Follow up appointment for results  Chief Complaint  Patient presents with   Ovarian Cyst    Blood pressure (!) 143/85, pulse 63.  CLINICAL DATA:  Abnormal CT, LEFT adnexal lesion   EXAM: TRANSABDOMINAL AND TRANSVAGINAL ULTRASOUND OF PELVIS   TECHNIQUE: Both transabdominal and transvaginal ultrasound examinations of the pelvis were performed. Transabdominal technique was performed for global imaging of the pelvis including uterus, ovaries, adnexal regions, and pelvic cul-de-sac. It was necessary to proceed with endovaginal exam following the transabdominal exam to visualize the uterus, endometrium, and LEFT ovary.   COMPARISON:  CT abdomen and pelvis 03/18/2021   FINDINGS: Uterus   Measurements: 3.4 x 1.9 x 3.4 cm = volume: 11.3 mL. Atrophic. Anteverted. No focal mass.   Endometrium   Thickness: 4 mm.  No endometrial fluid or mass   Right ovary   Not visualized, likely obscured by bowel   Left ovary   Measurements: 3.9 x 3.8 x 3.7 cm = volume: 29.2 mL. Complex cyst within LEFT ovary 3.4 x 3.2 x 3.2 cm containing scattered low level internal echogenicity and a single questionable 9 x 5 mm mural nodule. No septations.   Other findings   No rib pelvic fluid or additional adnexal masses.   IMPRESSION: Nonvisualization of RIGHT ovary.   Complex cyst within LEFT ovary 3.4 cm greatest diameter containing low level internal echogenicity in a single potential 9 x 5 mm mural nodule; either MR imaging with without contrast or surgical evaluation recommended.     Electronically Signed   By: Lavonia Dana M.D.   On: 09/13/2021 14:25  CLINICAL DATA:  Right lower quadrant pain   EXAM: CT ABDOMEN AND PELVIS WITH CONTRAST   TECHNIQUE: Multidetector CT imaging of the abdomen and pelvis was performed using the standard protocol following bolus administration of intravenous contrast.   RADIATION DOSE REDUCTION: This exam was performed according to the departmental  dose-optimization program which includes automated exposure control, adjustment of the mA and/or kV according to patient size and/or use of iterative reconstruction technique.   CONTRAST:  184mL OMNIPAQUE IOHEXOL 300 MG/ML  SOLN   COMPARISON:  CT abdomen and pelvis 03/18/2021   FINDINGS: Lower chest: No acute abnormality.   Hepatobiliary: Liver is normal in size and contour. A few small scattered hypodensities unchanged measuring up to 12 mm at the dome of the right hepatic lobe, most likely cysts. Gallbladder appears normal. No biliary ductal dilatation identified.   Pancreas: Unremarkable. No pancreatic ductal dilatation or surrounding inflammatory changes.   Spleen: Normal in size without focal abnormality.   Adrenals/Urinary Tract: Adrenal glands are normal. Symmetric perfusion of the kidneys. A few renal cortical cysts identified measuring up to 1.7 cm on the right. No hydronephrosis or enhancing renal mass identified bilaterally. Urinary bladder appears normal.   Stomach/Bowel: No bowel obstruction, free air or pneumatosis. Small hiatal hernia. Mild colonic diverticulosis. No bowel wall edema visualized. No evidence of acute appendicitis.   Vascular/Lymphatic: Severe atherosclerotic disease. No bulky lymphadenopathy identified.   Reproductive: Redemonstration of a 3.7 cm left adnexal/ovarian cystic mass. Calcifications and small fibroids in the uterus.   Other: No ascites.   Musculoskeletal: Postsurgical and degenerative changes of the lumbar spine.   IMPRESSION: 1. No acute process identified in the abdomen or pelvis. 2. Small hiatal hernia.  Mild colonic diverticulosis. 3. Stable 3.7 cm left adnexal/ovarian cystic mass. Recently evaluated with ultrasound. 4. Other chronic findings as described.     Electronically Signed   By: Ofilia Neas  M.D.   On: 09/20/2021 13:39  MEDS ordered this encounter: No orders of the defined types were placed in this  encounter.   Orders for this encounter: Orders Placed This Encounter  Procedures   CA 125    Impression: 1. Ovarian mass, left  - CA 125   Plan: Check CA 125, if normal repeat sonogram  If abnormal, will remove laparoscopically  Follow Up: Return in about 6 months (around 03/31/2022) for GYN sono, with Dr Elonda Husky.       All questions were answered.  Past Medical History:  Diagnosis Date   Complication of anesthesia    Depression    Diabetes mellitus    Fibromyalgia    GERD (gastroesophageal reflux disease)    Hiatal hernia    Hyperlipidemia    Hypertension    Hypothyroidism    Hypothyroidism 07/23/2016   PONV (postoperative nausea and vomiting)     Past Surgical History:  Procedure Laterality Date   APPENDECTOMY  2002   arthroscopy  right 2002, left 2007   bilateral knees   BACK SURGERY  10/2007, 09/2008   x2   BIOPSY  10/02/2016   Procedure: BIOPSY;  Surgeon: Rogene Houston, MD;  [facility];  Service: Endoscopy;;  gastric   BIOPSY  05/04/2020   Procedure: BIOPSY;  Surgeon: Harvel Quale, MD;  Location: [facility];  Service: Gastroenterology;;   [place]   bilateral   [place]   x2   COLONOSCOPY WITH PROPOFOL N/A 05/04/2020   Castaneda:The examined portion of the ileum was normal(focally active, nonspecific ileitis, neg for features of chronicity or granulomas)- A single (solitary) ulcer in the cecum.(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 tortuous colon, non bleeding internal hemorrhoids   ESOPHAGOGASTRODUODENOSCOPY N/A 10/02/2016   Procedure: ESOPHAGOGASTRODUODENOSCOPY (EGD);  Surgeon: Rogene Houston, MD;  [facility];  Service: Endoscopy;  Laterality: N/A;  11:15 - moved to 2/1 @ 3:00   ESOPHAGOGASTRODUODENOSCOPY (EGD) WITH PROPOFOL N/A 05/04/2020   Castaneda: normal  esophagus, 4cm hh with four cameron ulcers, multiple gastric polyps-fundic gland polyps, erythematous mucosa in pylorus, gastric antral mucosa and oxyntic mucosa with mild chronic gastritis and reactive changes, neg h pylori, normal examined duodenum   KNEE SURGERY     both    OB History   No obstetric history on file.     Allergies  Allergen Reactions   Codeine Other (See Comments)    Headache, gi upset   Penicillins Rash    Has patient had a PCN reaction causing immediate rash, facial/tongue/throat swelling, SOB or lightheadedness with hypotension: Yes Has patient had a PCN reaction causing severe rash involving mucus membranes or skin necrosis: No Has patient had a PCN reaction that required hospitalization No Has patient had a PCN reaction occurring within the last 10 years: No If all of the above answers are "NO", then may proceed with Cephalosporin use.    Pravastatin     Leg cramps    Social History   Socioeconomic History   Marital status: Widowed    Spouse name: Not on file   Number of children: 4   Years of education: 43   Highest education level: 12th grade  Occupational History    Employer: RETIRED  Tobacco Use   Smoking status: Former    Packs/day: 1.00    Years: 43.00    Pack years:  43.00    Types: Cigarettes    Quit date: 05/02/1970    Years since quitting: 51.4   Smokeless tobacco: Never   Tobacco comments:    > 20 years quit  Vaping Use   Vaping Use: Never used  Substance and Sexual Activity   Alcohol use: No    Comment: occasionally wine   Drug use: No   Sexual activity: Yes    Birth control/protection: Post-menopausal  Other Topics Concern   Not on file  Social History Narrative   Lives alone.  Widowed    1 dog, 2 cats   2 daughters, 1 local   Enjoys dancing and sewing   Social Determinants of Radio broadcast assistant Strain: Low Risk    Difficulty of Paying Living Expenses: Not hard at all  Food Insecurity: No Food Insecurity    Worried About Charity fundraiser in the Last Year: Never true   Arboriculturist in the Last Year: Never true  Transportation Needs: No Transportation Needs   Lack of Transportation (Medical): No   Lack of Transportation (Non-Medical): No  Physical Activity: Insufficiently Active   Days of Exercise per Week: 7 days   Minutes of Exercise per Session: 20 min  Stress: No Stress Concern Present   Feeling of Stress : Not at all  Social Connections: Moderately Integrated   Frequency of Communication with Friends and Family: More than three times a week   Frequency of Social Gatherings with Friends and Family: Once a week   Attends Religious Services: More than 4 times per year   Active Member of [employer] or Organizations: Yes   Attends Archivist Meetings: More than 4 times per year   Marital Status: Widowed    Family History  Problem Relation Age of Onset   Anesthesia problems Mother    CAD Mother 32   Cancer Mother        Pancreatic cancer   Hypertension Father    Suicidality Father        gunshot   Deafness Sister    Heart disease Brother    Suicidality Son    Cancer Sister        female    Suicidality Brother    Hypotension Neg Hx    Pseudochol deficiency Neg Hx    Malignant hyperthermia Neg Hx

## 2021-10-02 LAB — CA 125: Cancer Antigen (CA) 125: 11.4 U/mL (ref 0.0–38.1)

## 2021-10-09 DIAGNOSIS — M25562 Pain in left knee: Secondary | ICD-10-CM | POA: Diagnosis not present

## 2021-10-09 DIAGNOSIS — M1712 Unilateral primary osteoarthritis, left knee: Secondary | ICD-10-CM | POA: Diagnosis not present

## 2021-10-09 DIAGNOSIS — M25462 Effusion, left knee: Secondary | ICD-10-CM | POA: Diagnosis not present

## 2021-10-17 DIAGNOSIS — M25561 Pain in right knee: Secondary | ICD-10-CM | POA: Diagnosis not present

## 2021-10-17 DIAGNOSIS — M1711 Unilateral primary osteoarthritis, right knee: Secondary | ICD-10-CM | POA: Diagnosis not present

## 2021-10-18 ENCOUNTER — Other Ambulatory Visit: Payer: Self-pay | Admitting: Family

## 2021-10-18 DIAGNOSIS — E039 Hypothyroidism, unspecified: Secondary | ICD-10-CM

## 2021-10-21 ENCOUNTER — Other Ambulatory Visit: Payer: Self-pay | Admitting: Family

## 2021-10-21 DIAGNOSIS — I10 Essential (primary) hypertension: Secondary | ICD-10-CM

## 2021-10-22 DIAGNOSIS — I10 Essential (primary) hypertension: Secondary | ICD-10-CM | POA: Diagnosis not present

## 2021-10-22 DIAGNOSIS — M25462 Effusion, left knee: Secondary | ICD-10-CM | POA: Diagnosis not present

## 2021-10-22 DIAGNOSIS — M170 Bilateral primary osteoarthritis of knee: Secondary | ICD-10-CM | POA: Diagnosis not present

## 2021-10-24 ENCOUNTER — Other Ambulatory Visit: Payer: Self-pay | Admitting: Family

## 2021-10-24 DIAGNOSIS — E119 Type 2 diabetes mellitus without complications: Secondary | ICD-10-CM

## 2021-10-24 DIAGNOSIS — M25562 Pain in left knee: Secondary | ICD-10-CM | POA: Diagnosis not present

## 2021-10-24 DIAGNOSIS — M1712 Unilateral primary osteoarthritis, left knee: Secondary | ICD-10-CM | POA: Diagnosis not present

## 2021-10-31 DIAGNOSIS — M1711 Unilateral primary osteoarthritis, right knee: Secondary | ICD-10-CM | POA: Diagnosis not present

## 2021-10-31 DIAGNOSIS — M25561 Pain in right knee: Secondary | ICD-10-CM | POA: Diagnosis not present

## 2021-11-07 DIAGNOSIS — M25562 Pain in left knee: Secondary | ICD-10-CM | POA: Diagnosis not present

## 2021-11-07 DIAGNOSIS — M1712 Unilateral primary osteoarthritis, left knee: Secondary | ICD-10-CM | POA: Diagnosis not present

## 2021-11-11 ENCOUNTER — Ambulatory Visit (INDEPENDENT_AMBULATORY_CARE_PROVIDER_SITE_OTHER): Payer: Medicare Other | Admitting: Family

## 2021-11-11 ENCOUNTER — Encounter: Payer: Self-pay | Admitting: Family

## 2021-11-11 VITALS — BP 118/65 | HR 71 | Temp 97.0°F | Ht 59.0 in | Wt 188.2 lb

## 2021-11-11 DIAGNOSIS — E1169 Type 2 diabetes mellitus with other specified complication: Secondary | ICD-10-CM | POA: Diagnosis not present

## 2021-11-11 DIAGNOSIS — I2510 Atherosclerotic heart disease of native coronary artery without angina pectoris: Secondary | ICD-10-CM

## 2021-11-11 DIAGNOSIS — F411 Generalized anxiety disorder: Secondary | ICD-10-CM

## 2021-11-11 DIAGNOSIS — I10 Essential (primary) hypertension: Secondary | ICD-10-CM

## 2021-11-11 DIAGNOSIS — G4700 Insomnia, unspecified: Secondary | ICD-10-CM | POA: Diagnosis not present

## 2021-11-11 DIAGNOSIS — Z79899 Other long term (current) drug therapy: Secondary | ICD-10-CM | POA: Diagnosis not present

## 2021-11-11 DIAGNOSIS — E039 Hypothyroidism, unspecified: Secondary | ICD-10-CM | POA: Diagnosis not present

## 2021-11-11 DIAGNOSIS — K219 Gastro-esophageal reflux disease without esophagitis: Secondary | ICD-10-CM

## 2021-11-11 DIAGNOSIS — R1030 Lower abdominal pain, unspecified: Secondary | ICD-10-CM

## 2021-11-11 DIAGNOSIS — M5440 Lumbago with sciatica, unspecified side: Secondary | ICD-10-CM | POA: Diagnosis not present

## 2021-11-11 DIAGNOSIS — Z23 Encounter for immunization: Secondary | ICD-10-CM

## 2021-11-11 DIAGNOSIS — E785 Hyperlipidemia, unspecified: Secondary | ICD-10-CM

## 2021-11-11 DIAGNOSIS — I2583 Coronary atherosclerosis due to lipid rich plaque: Secondary | ICD-10-CM

## 2021-11-11 DIAGNOSIS — G8929 Other chronic pain: Secondary | ICD-10-CM | POA: Diagnosis not present

## 2021-11-11 LAB — LIPID PANEL

## 2021-11-11 LAB — BAYER DCA HB A1C WAIVED: HB A1C (BAYER DCA - WAIVED): 7 % — ABNORMAL HIGH (ref 4.8–5.6)

## 2021-11-11 MED ORDER — HYDROCODONE-ACETAMINOPHEN 5-325 MG PO TABS: 1.0000 | ORAL_TABLET | Freq: Four times a day (QID) | ORAL | 0 refills | Status: DC | PRN

## 2021-11-11 MED ORDER — DIAZEPAM 5 MG PO TABS: 5.0000 mg | ORAL_TABLET | Freq: Four times a day (QID) | ORAL | 2 refills | Status: DC | PRN

## 2021-11-11 NOTE — Progress Notes (Signed)
Subjective:    Patient ID: Cynthia Mays, female    DOB: [birth year], 81 y.o.   MRN: [id number]  Chief Complaint  Patient presents with   Medical Management of Chronic Issues   Arthritis    In both wrist    PT presents to the office today for chronic follow up. She is morbid obese with a BMI of 38 and DM and HTN.  Arthritis Presents for follow-up visit. She complains of pain and stiffness. The symptoms have been stable. Affected locations include the left MCP, right MCP, right wrist and left wrist. Her pain is at a severity of 10/10. Associated symptoms include fatigue.  Hypertension This is a chronic problem. The current episode started more than 1 year ago. The problem has been resolved since onset. The problem is controlled. Associated symptoms include anxiety and malaise/fatigue. Pertinent negatives include no blurred vision, peripheral edema or shortness of breath. Risk factors for coronary artery disease include dyslipidemia, obesity and sedentary lifestyle. The current treatment provides moderate improvement. Identifiable causes of hypertension include a thyroid problem.  Gastroesophageal Reflux She complains of belching and heartburn. This is a chronic problem. The current episode started more than 1 year ago. The problem occurs occasionally. Associated symptoms include fatigue. Risk factors include obesity. She has tried a PPI for the symptoms. The treatment provided moderate relief.  Thyroid Problem Presents for follow-up visit. Symptoms include anxiety and fatigue. Patient reports no diaphoresis. The symptoms have been stable. Her past medical history is significant for hyperlipidemia.  Hyperlipidemia This is a chronic problem. The current episode started more than 1 year ago. Exacerbating diseases include obesity. Pertinent negatives include no shortness of breath. Current antihyperlipidemic treatment includes statins. The current treatment provides moderate improvement of  lipids. Risk factors for coronary artery disease include dyslipidemia, hypertension, a sedentary lifestyle and post-menopausal.  Diabetes She presents for her follow-up diabetic visit. She has type 2 diabetes mellitus. Hypoglycemia symptoms include nervousness/anxiousness. Associated symptoms include fatigue. Pertinent negatives for diabetes include no blurred vision and no foot paresthesias. Diabetic complications include heart disease. Pertinent negatives for diabetic complications include no peripheral neuropathy. Risk factors for coronary artery disease include dyslipidemia, diabetes mellitus, hypertension, sedentary lifestyle and post-menopausal. (Does not check BS at home) Eye exam is not current.  Back Pain This is a chronic problem. The current episode started more than 1 year ago. The problem occurs intermittently. The problem has been waxing and waning since onset. The pain is present in the lumbar spine. The quality of the pain is described as aching. The pain is at a severity of 2/10 (only when bending for long periods of time). The patient is experiencing no pain.  Anxiety Presents for follow-up visit. Symptoms include excessive worry and nervous/anxious behavior. Patient reports no irritability, restlessness or shortness of breath. The severity of symptoms is moderate.     Current opioids rx- Norco 5-325 mg as needed, one rx lasts 3 months. She also takes Valium 5 mg # meds rx- #28 of Norco and #30 of Valium Effectiveness of current meds-Stable Adverse reactions from pain meds-None Morphine equivalent- <20 mm  Pill count performed-No Last drug screen - 11/11/21 ( high risk q8m, moderate risk q78m, low risk yearly ) Urine drug screen today- Yes Was the [place] reviewed- yes  If yes were their any concerning findings? - none  Pain contract signed on: 12/21/20   Review of Systems  Constitutional:  Positive for fatigue and malaise/fatigue. Negative for diaphoresis and irritability.  Eyes:  Negative for blurred vision.  Respiratory:  Negative for shortness of breath.   Gastrointestinal:  Positive for heartburn.  Musculoskeletal:  Positive for arthritis, back pain and stiffness.  Psychiatric/Behavioral:  The patient is nervous/anxious.   All other systems reviewed and are negative.     Objective:   Physical Exam Vitals reviewed.  Constitutional:      General: She is not in acute distress.    Appearance: She is well-developed. She is obese.  HENT:     Head: Normocephalic and atraumatic.     Right Ear: Tympanic membrane normal.     Left Ear: Tympanic membrane normal.  Eyes:     Pupils: Pupils are equal, round, and reactive to light.  Neck:     Thyroid: No thyromegaly.  Cardiovascular:     Rate and Rhythm: Normal rate and regular rhythm.     Heart sounds: Normal heart sounds. No murmur heard. Pulmonary:     Effort: Pulmonary effort is normal. No respiratory distress.     Breath sounds: Normal breath sounds. No wheezing.  Abdominal:     General: Bowel sounds are normal. There is no distension.     Palpations: Abdomen is soft.     Tenderness: There is no abdominal tenderness.  Musculoskeletal:        General: No tenderness. Normal range of motion.     Cervical back: Normal range of motion and neck supple.  Skin:    General: Skin is warm and dry.  Neurological:     Mental Status: She is alert and oriented to person, place, and time.     Cranial Nerves: No cranial nerve deficit.     Deep Tendon Reflexes: Reflexes are normal and symmetric.  Psychiatric:        Behavior: Behavior normal.        Thought Content: Thought content normal.        Judgment: Judgment normal.   Diabetic Foot Exam - Simple   Simple Foot Form Diabetic Foot exam was performed with the following findings: Yes 11/11/2021 11:14 AM  Visual Inspection No deformities, no ulcerations, no other skin breakdown bilaterally: Yes Sensation Testing Intact to touch and monofilament testing  bilaterally: Yes Pulse Check Posterior Tibialis and Dorsalis pulse intact bilaterally: Yes Comments      BP 118/65    Pulse 71    Temp (!) 97 F (36.1 C) (Temporal)    Ht $R'4\' 11"'PC$  (1.499 m)    Wt 188 lb 3.2 oz (85.4 kg)    BMI 38.01 kg/m      Assessment & Plan:  CHANTEA SURACE comes in today with chief complaint of Medical Management of Chronic Issues and Arthritis (In both wrist )   Diagnosis and orders addressed:  1. Anxiety, generalized - diazepam (VALIUM) 5 MG tablet; Take 1 tablet (5 mg total) by mouth every 6 (six) hours as needed for anxiety or muscle spasms.  Dispense: 30 tablet; Refill: 2 - CMP14+EGFR - CBC with Differential/Platelet  2. Chronic use of benzodiazepine for therapeutic purpose - diazepam (VALIUM) 5 MG tablet; Take 1 tablet (5 mg total) by mouth every 6 (six) hours as needed for anxiety or muscle spasms.  Dispense: 30 tablet; Refill: 2 - CMP14+EGFR - CBC with Differential/Platelet - ToxASSURE Select 13 (MW), Urine  3. Lower abdominal pain - HYDROcodone-acetaminophen (NORCO/VICODIN) 5-325 MG tablet; Take 1 tablet by mouth every 6 (six) hours as needed for moderate pain.  Dispense: 28 tablet; Refill: 0 - CMP14+EGFR -  CBC with Differential/Platelet  4. Chronic left-sided low back pain with sciatica, sciatica laterality unspecified - HYDROcodone-acetaminophen (NORCO/VICODIN) 5-325 MG tablet; Take 1 tablet by mouth every 6 (six) hours as needed for moderate pain.  Dispense: 28 tablet; Refill: 0 - CMP14+EGFR - CBC with Differential/Platelet  5. Essential hypertension - CMP14+EGFR - CBC with Differential/Platelet  6. Coronary artery disease due to lipid rich plaque - CMP14+EGFR - CBC with Differential/Platelet  7. Gastroesophageal reflux disease without esophagitis - CMP14+EGFR - CBC with Differential/Platelet  8. Hyperlipidemia associated with type 2 diabetes mellitus (HCC) - CMP14+EGFR - CBC with Differential/Platelet - Lipid panel  9.  Acquired hypothyroidism - CMP14+EGFR - CBC with Differential/Platelet - TSH  10. Type 2 diabetes mellitus with other specified complication, without long-term current use of insulin (HCC)  - Bayer DCA Hb A1c Waived - CMP14+EGFR - CBC with Differential/Platelet  11. Morbid obesity ([place]) - CMP14+EGFR - CBC with Differential/Platelet  12. Insomnia, unspecified type  - CMP14+EGFR - CBC with Differential/Platelet  13. Controlled substance agreement signed - ToxASSURE Select 13 (MW), Urine   Labs pending Patient reviewed in [place] controlled database, no flags noted. Contract and drug screen are up to date.  Health Maintenance reviewed Diet and exercise encouraged  Follow up plan: 3 months    Evelina Dun, FNP

## 2021-11-11 NOTE — Progress Notes (Signed)
[place]

## 2021-11-11 NOTE — Patient Instructions (Signed)
Osteoarthritis Osteoarthritis is a type of arthritis. It refers to joint pain or joint disease. Osteoarthritis affects tissue that covers the ends of bones in joints (cartilage). Cartilage acts as a cushion between the bones and helps them move smoothly. Osteoarthritis occurs when cartilage in the joints gets worn down. Osteoarthritis is sometimes called "wear and tear" arthritis. Osteoarthritis is the most common form of arthritis. It often occurs in older people. It is a condition that gets worse over time. The joints most often affected by this condition are in the fingers, toes, hips, knees, and spine, including the neck and lower back. What are the causes? This condition is caused by the wearing down of cartilage that covers the ends of bones. What increases the risk? The following factors may make you more likely to develop this condition: Being age 50 or older. Obesity. Overuse of joints. Past injury of a joint. Past surgery on a joint. Family history of osteoarthritis. What are the signs or symptoms? The main symptoms of this condition are pain, swelling, and stiffness in the joint. Other symptoms may include: An enlarged joint. More pain and further damage caused by small pieces of bone or cartilage that break off and float inside of the joint. Small deposits of bone (osteophytes) that grow on the edges of the joint. A grating or scraping feeling inside the joint when you move it. Popping or creaking sounds when you move. Difficulty walking or exercising. An inability to grip items, twist your hand(s), or control the movements of your hands and fingers. How is this diagnosed? This condition may be diagnosed based on: Your medical history. A physical exam. Your symptoms. X-rays of the affected joint(s). Blood tests to rule out other types of arthritis. How is this treated? There is no cure for this condition, but treatment can help control pain and improve joint function.  Treatment may include a combination of therapies, such as: Pain relief techniques, such as: Applying heat and cold to the joint. Massage. A form of talk therapy called cognitive behavioral therapy (CBT). This therapy helps you set goals and follow up on the changes that you make. Medicines for pain and inflammation. The medicines can be taken by mouth or applied to the skin. They include: NSAIDs, such as ibuprofen. Prescription medicines. Strong anti-inflammatory medicines (corticosteroids). Certain nutritional supplements. A prescribed exercise program. You may work with a physical therapist. Assistive devices, such as a brace, wrap, splint, specialized glove, or cane. A weight control plan. Surgery, such as: An osteotomy. This is done to reposition the bones and relieve pain or to remove loose pieces of bone and cartilage. Joint replacement surgery. You may need this surgery if you have advanced osteoarthritis. Follow these instructions at home: Activity Rest your affected joints as told by your health care provider. Exercise as told by your health care provider. He or she may recommend specific types of exercise, such as: Strengthening exercises. These are done to strengthen the muscles that support joints affected by arthritis. Aerobic activities. These are exercises, such as brisk walking or water aerobics, that increase your heart rate. Range-of-motion activities. These help your joints move more easily. Balance and agility exercises. Managing pain, stiffness, and swelling   If directed, apply heat to the affected area as often as told by your health care provider. Use the heat source that your health care provider recommends, such as a moist heat pack or a heating pad. If you have a removable assistive device, remove it as told by   your health care provider. Place a towel between your skin and the heat source. If your health care provider tells you to keep the assistive device on  while you apply heat, place a towel between the assistive device and the heat source. Leave the heat on for 20-30 minutes. Remove the heat if your skin turns bright red. This is especially important if you are unable to feel pain, heat, or cold. You may have a greater risk of getting burned. If directed, put ice on the affected area. To do this: If you have a removable assistive device, remove it as told by your health care provider. Put ice in a plastic bag. Place a towel between your skin and the bag. If your health care provider tells you to keep the assistive device on during icing, place a towel between the assistive device and the bag. Leave the ice on for 20 minutes, 2-3 times a day. Move your fingers or toes often to reduce stiffness and swelling. Raise (elevate) the injured area above the level of your heart while you are sitting or lying down. General instructions Take over-the-counter and prescription medicines only as told by your health care provider. Maintain a healthy weight. Follow instructions from your health care provider for weight control. Do not use any products that contain nicotine or tobacco, such as cigarettes, e-cigarettes, and chewing tobacco. If you need help quitting, ask your health care provider. Use assistive devices as told by your health care provider. Keep all follow-up visits as told by your health care provider. This is important. Where to find more information National Institute of Arthritis and Musculoskeletal and Skin Diseases: www.niams.nih.gov National Institute on Aging: www.nia.nih.gov American College of Rheumatology: www.rheumatology.org Contact a health care provider if: You have redness, swelling, or a feeling of warmth in a joint that gets worse. You have a fever along with joint or muscle aches. You develop a rash. You have trouble doing your normal activities. Get help right away if: You have pain that gets worse and is not relieved by  pain medicine. Summary Osteoarthritis is a type of arthritis that affects tissue covering the ends of bones in joints (cartilage). This condition is caused by the wearing down of cartilage that covers the ends of bones. The main symptom of this condition is pain, swelling, and stiffness in the joint. There is no cure for this condition, but treatment can help control pain and improve joint function. This information is not intended to replace advice given to you by your health care provider. Make sure you discuss any questions you have with your health care provider. Document Revised: 08/15/2019 Document Reviewed: 08/15/2019 Elsevier Patient Education  2022 [employer]  

## 2021-11-12 LAB — CMP14+EGFR
ALT: 10 IU/L (ref 0–32)
AST: 17 IU/L (ref 0–40)
Albumin/Globulin Ratio: 1.3 (ref 1.2–2.2)
Albumin: 4.2 g/dL (ref 3.7–4.7)
Alkaline Phosphatase: 99 IU/L (ref 44–121)
BUN/Creatinine Ratio: 14 (ref 12–28)
BUN: 15 mg/dL (ref 8–27)
Bilirubin Total: 0.3 mg/dL (ref 0.0–1.2)
CO2: 22 mmol/L (ref 20–29)
Calcium: 9.2 mg/dL (ref 8.7–10.3)
Chloride: 107 mmol/L — ABNORMAL HIGH (ref 96–106)
Creatinine, Ser: 1.06 mg/dL — ABNORMAL HIGH (ref 0.57–1.00)
Globulin, Total: 3.3 g/dL (ref 1.5–4.5)
Glucose: 131 mg/dL — ABNORMAL HIGH (ref 70–99)
Potassium: 3.8 mmol/L (ref 3.5–5.2)
Sodium: 143 mmol/L (ref 134–144)
Total Protein: 7.5 g/dL (ref 6.0–8.5)
eGFR: 53 mL/min/{1.73_m2} — ABNORMAL LOW (ref >59)

## 2021-11-12 LAB — LIPID PANEL
Chol/HDL Ratio: 3.6 ratio (ref 0.0–4.4)
Cholesterol, Total: 178 mg/dL (ref 100–199)
HDL: 50 mg/dL (ref >39)
LDL Chol Calc (NIH): 96 mg/dL (ref 0–99)
Triglycerides: 188 mg/dL — ABNORMAL HIGH (ref 0–149)
VLDL Cholesterol Cal: 32 mg/dL (ref 5–40)

## 2021-11-12 LAB — CBC WITH DIFFERENTIAL/PLATELET
Basophils Absolute: 0.1 10*3/uL (ref 0.0–0.2)
Basos: 1 %
EOS (ABSOLUTE): 0.2 10*3/uL (ref 0.0–0.4)
Eos: 4 %
Hematocrit: 40.5 % (ref 34.0–46.6)
Hemoglobin: 13.1 g/dL (ref 11.1–15.9)
Immature Grans (Abs): 0 10*3/uL (ref 0.0–0.1)
Immature Granulocytes: 0 %
Lymphocytes Absolute: 1.3 10*3/uL (ref 0.7–3.1)
Lymphs: 31 %
MCH: 26.9 pg (ref 26.6–33.0)
MCHC: 32.3 g/dL (ref 31.5–35.7)
MCV: 83 fL (ref 79–97)
Monocytes Absolute: 0.3 10*3/uL (ref 0.1–0.9)
Monocytes: 7 %
Neutrophils Absolute: 2.4 10*3/uL (ref 1.4–7.0)
Neutrophils: 57 %
Platelets: 210 10*3/uL (ref 150–450)
RBC: 4.87 x10E6/uL (ref 3.77–5.28)
RDW: 14.8 % (ref 11.7–15.4)
WBC: 4.2 10*3/uL (ref 3.4–10.8)

## 2021-11-12 LAB — TSH: TSH: 2.42 u[IU]/mL (ref 0.450–4.500)

## 2021-11-13 DIAGNOSIS — M25561 Pain in right knee: Secondary | ICD-10-CM | POA: Diagnosis not present

## 2021-11-13 DIAGNOSIS — M1711 Unilateral primary osteoarthritis, right knee: Secondary | ICD-10-CM | POA: Diagnosis not present

## 2021-11-17 LAB — TOXASSURE SELECT 13 (MW), URINE

## 2021-11-21 DIAGNOSIS — I10 Essential (primary) hypertension: Secondary | ICD-10-CM | POA: Diagnosis not present

## 2021-11-21 DIAGNOSIS — M1712 Unilateral primary osteoarthritis, left knee: Secondary | ICD-10-CM | POA: Diagnosis not present

## 2021-11-21 DIAGNOSIS — M25561 Pain in right knee: Secondary | ICD-10-CM | POA: Diagnosis not present

## 2021-11-21 DIAGNOSIS — E119 Type 2 diabetes mellitus without complications: Secondary | ICD-10-CM | POA: Diagnosis not present

## 2021-11-21 DIAGNOSIS — M25562 Pain in left knee: Secondary | ICD-10-CM | POA: Diagnosis not present

## 2021-11-27 DIAGNOSIS — M25561 Pain in right knee: Secondary | ICD-10-CM | POA: Diagnosis not present

## 2021-11-27 DIAGNOSIS — M1711 Unilateral primary osteoarthritis, right knee: Secondary | ICD-10-CM | POA: Diagnosis not present

## 2021-12-02 ENCOUNTER — Ambulatory Visit (INDEPENDENT_AMBULATORY_CARE_PROVIDER_SITE_OTHER): Payer: Medicare Other

## 2021-12-02 VITALS — Wt 183.0 lb

## 2021-12-02 DIAGNOSIS — Z981 Arthrodesis status: Secondary | ICD-10-CM | POA: Insufficient documentation

## 2021-12-02 DIAGNOSIS — M5440 Lumbago with sciatica, unspecified side: Secondary | ICD-10-CM | POA: Insufficient documentation

## 2021-12-02 DIAGNOSIS — Z0000 Encounter for general adult medical examination without abnormal findings: Secondary | ICD-10-CM | POA: Diagnosis not present

## 2021-12-02 NOTE — Progress Notes (Signed)
? ?Subjective:  ? Cynthia Mays is a 81 y.o. female who presents for Medicare Annual (Subsequent) preventive examination. ? ?Virtual Visit via Telephone Note ? ?I connected with  Cynthia Mays on 12/02/21 at 12:00 PM EDT by telephone and verified that I am speaking with the correct person using two identifiers. ? ?Location: ?Patient: Home ?Provider: [facility] ?Persons participating in the virtual visit: patient/Nurse Health Advisor ?  ?I discussed the limitations, risks, security and privacy concerns of performing an evaluation and management service by telephone and the availability of in person appointments. The patient expressed understanding and agreed to proceed. ? ?Interactive audio and video telecommunications were attempted between this nurse and patient, however failed, due to patient having technical difficulties OR patient did not have access to video capability.  We continued and completed visit with audio only. ? ?Some vital signs may be absent or patient reported.  ? ?Shaquisha Wynn Dionne Ano, LPN  ? ?Review of Systems    ? ?Cardiac Risk Factors include: advanced age (>22men, >110 women);obesity (BMI >30kg/m2);sedentary lifestyle;diabetes mellitus;dyslipidemia;hypertension;Other (see comment), Risk factor comments: CAD ? ?   ?Objective:  ?  ?Today's Vitals  ? 12/02/21 1205  ?Weight: 183 lb (83 kg)  ?PainSc: 4   ? ?Body mass index is 36.96 kg/m?. ? ? ?  12/02/2021  ? 12:23 PM 11/29/2020  ? 10:11 AM 07/05/2020  ?  2:13 PM 05/04/2020  ?  9:16 AM 05/02/2020  ? 11:12 AM 02/26/2020  ?  5:10 AM 02/25/2020  ?  7:24 PM  ?Advanced Directives  ?Does Patient Have a Medical Advance Directive? Yes Yes Yes No No No No  ?Type of Paramedic of [place];Living will [place];Living will       ?Copy of [place] in Chart? No - copy requested No - copy requested       ?Would patient like information on creating a medical advance directive?    No - Patient declined No -  Patient declined No - Patient declined No - Patient declined  ? ? ?Current Medications (verified) ?Outpatient Encounter Medications as of 12/02/2021  ?Medication Sig  ? cholecalciferol (VITAMIN D3) 25 MCG (1000 UNIT) tablet Take 1,000 Units by mouth daily.   ? clobetasol cream (TEMOVATE) 0.03 % APPLY 1 APPLICATION TOPICALLY ONCE DAILY AS NEEDED FOR  RASH  ? diazepam (VALIUM) 5 MG tablet Take 1 tablet (5 mg total) by mouth every 6 (six) hours as needed for anxiety or muscle spasms.  ? FARXIGA 5 MG TABS tablet TAKE 1 TABLET BY MOUTH ONCE DAILY BEFORE BREAKFAST  ? fexofenadine (ALLEGRA) 180 MG tablet Take 180 mg by mouth daily.  ? fluticasone (FLONASE) 50 MCG/ACT nasal spray Place 2 sprays into the nose daily.  ? HYDROcodone-acetaminophen (NORCO/VICODIN) 5-325 MG tablet Take 1 tablet by mouth every 6 (six) hours as needed for moderate pain.  ? levothyroxine (SYNTHROID) 112 MCG tablet TAKE 1 TABLET BY MOUTH ONCE DAILY BEFORE BREAKFAST  ? mesalamine (LIALDA) 1.2 g EC tablet Take 2 tablets (2.4 g total) by mouth daily with breakfast.  ? Multiple Vitamins-Minerals (MULTIVITAMINS THER. W/MINERALS) TABS Take 1 tablet by mouth daily.  ? NIFEdipine (PROCARDIA-XL/NIFEDICAL-XL) 30 MG 24 hr tablet Take 1 tablet by mouth once daily  ? pantoprazole (PROTONIX) 40 MG tablet Take 1 tablet (40 mg total) by mouth 2 (two) times daily.  ? Probiotic Product ([place]) CAPS Take 1 capsule by mouth daily.   ? rosuvastatin (CRESTOR) 5 MG  tablet Take 1 tablet by mouth once daily  ? sertraline (ZOLOFT) 50 MG tablet TAKE 1 & 1/2 (ONE & ONE-HALF) TABLETS BY MOUTH ONCE DAILY  ? traZODone (DESYREL) 50 MG tablet Take 1-2 tablets (50-100 mg total) by mouth at bedtime.  ? Accu-Chek Softclix Lancets lancets USE TO CHECK BLOOD SUGAR UP TO 4 TIMES A DAY AS DIRECTED (Patient not taking: Reported on 12/02/2021)  ? blood glucose meter kit and supplies Dispense based on patient and insurance preference. Use up to four times daily as directed. (FOR  ICD-10 E10.9, E11.9). (Patient not taking: Reported on 12/02/2021)  ? ondansetron (ZOFRAN ODT) 8 MG disintegrating tablet Take 1 tablet (8 mg total) by mouth every 8 (eight) hours as needed for nausea or vomiting. (Patient not taking: Reported on 12/02/2021)  ? [DISCONTINUED] famotidine (PEPCID) 40 MG tablet TAKE 1 TABLET BY MOUTH AT BEDTIME (Patient not taking: Reported on 12/02/2021)  ? ?No facility-administered encounter medications on file as of 12/02/2021.  ? ? ?Allergies (verified) ?Codeine, Penicillins, and Pravastatin  ? ?History: ?Past Medical History:  ?Diagnosis Date  ? Complication of anesthesia   ? Depression   ? Diabetes mellitus   ? Fibromyalgia   ? GERD (gastroesophageal reflux disease)   ? Hiatal hernia   ? Hyperlipidemia   ? Hypertension   ? Hypothyroidism   ? Hypothyroidism 07/23/2016  ? PONV (postoperative nausea and vomiting)   ? ?Past Surgical History:  ?Procedure Laterality Date  ? APPENDECTOMY  2002  ? arthroscopy  right 2002, left 2007  ? bilateral knees  ? BACK SURGERY  10/2007, 09/2008  ? x2  ? BIOPSY  10/02/2016  ? Procedure: BIOPSY;  Surgeon: Rogene Houston, MD;  [facility];  Service: Endoscopy;;  gastric  ? BIOPSY  05/04/2020  ? Procedure: BIOPSY;  Surgeon: Montez Morita, Quillian Quince, MD;  [facility]: [facility];  Service: Gastroenterology;;  ? [place]  ? bilateral  ? [place]  ? x2  ? COLONOSCOPY WITH PROPOFOL N/A 05/04/2020  ? 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 tortuous colon, non bleeding internal hemorrhoids  ? ESOPHAGOGASTRODUODENOSCOPY N/A 10/02/2016  ? Procedure: ESOPHAGOGASTRODUODENOSCOPY (EGD);  Surgeon: Rogene Houston, MD;  [facility];  Service: Endoscopy;  Laterality: N/A;  11:15 - moved to 2/1 @ 3:00  ? ESOPHAGOGASTRODUODENOSCOPY (EGD) WITH PROPOFOL N/A 05/04/2020  ? Castaneda: normal esophagus, 4cm hh with four cameron ulcers, multiple gastric polyps-fundic gland polyps, erythematous mucosa in pylorus, gastric antral mucosa and oxyntic mucosa with mild chronic gastritis and reactive changes, neg h pylori, normal examined duodenum  ? KNEE SURGERY    ? both  ? ?Family History  ?Problem Relation Age of Onset  ? Anesthesia problems Mother   ? CAD Mother 26  ? Cancer Mother   ?     Pancreatic cancer  ? Hypertension Father   ? Suicidality Father   ?     gunshot  ? Deafness Sister   ? Heart disease Brother   ? Suicidality Son   ? Cancer Sister   ?     female   ? Suicidality Brother   ? Hypotension Neg Hx   ? Pseudochol deficiency Neg Hx   ? Malignant hyperthermia Neg Hx   ? ?  Social History  ? ?Socioeconomic History  ? Marital status: Widowed  ?  Spouse name: Not on file  ? Number of children: 4  ? Years of education: 19  ? Highest education level: 12th grade  ?Occupational History  ?  Employer: RETIRED  ?Tobacco Use  ? Smoking status: Former  ?  Packs/day: 1.00  ?  Years: 43.00  ?  Pack years: 43.00  ?  Types: Cigarettes  ?  Quit date: 05/02/1970  ?  Years since quitting: 51.6  ? Smokeless tobacco: Never  ? Tobacco comments:  ?  > 20 years quit  ?Vaping Use  ? Vaping Use: Never used  ?Substance and Sexual Activity  ? Alcohol use: No  ?  Comment: occasionally wine  ? Drug use: No  ? Sexual activity: Yes  ?  Birth control/protection: Post-menopausal  ?Other Topics Concern  ? Not on file  ?Social History Narrative  ? Lives alone.  Widowed   ? 1 dog, 2 cats  ? 2 daughters, 1 local  ? Enjoys dancing and sewing  ? Daughter lives 5 miles away, grandchildren grown and visit often  ? ?Social Determinants of Health  ? ?Financial Resource Strain: Low Risk   ? Difficulty of Paying Living Expenses: Not very hard  ?Food Insecurity: No Food Insecurity  ?  Worried About Charity fundraiser in the Last Year: Never true  ? Ran Out of Food in the Last Year: Never true  ?Transportation Needs: No Transportation Needs  ? Lack of Transportation (Medical): No  ? Lack of [place]

## 2021-12-02 NOTE — Patient Instructions (Signed)
Cynthia Mays , ?Thank you for taking time to come for your Medicare Wellness Visit. I appreciate your ongoing commitment to your health goals. Please review the following plan we discussed and let me know if I can assist you in the future.  ? ?Screening recommendations/referrals: ?Colonoscopy: Done 05/04/2020 - no repeat required ?Mammogram: No longer required - declines repeats ?Bone Density: Done 07/21/2016 - Recommend repeat every 2 years *consider at next visit ?Recommended yearly ophthalmology/optometry visit for glaucoma screening and checkup ?Recommended yearly dental visit for hygiene and checkup ? ?Vaccinations: ?Influenza vaccine: Done 06/01/2021 - Repeat annually ?Pneumococcal vaccine: Done 10/01/2007, 10/01/2015, & 06/05/2017 ?Tdap vaccine: Done 07/31/2014 - Repeat in 10 years ?Shingles vaccine: Done 05/14/2021 & 11/11/2021 ?Covid-19:Done 09/13/2019, 10/14/2019, & 08/29/2020 ? ?Advanced directives: Please bring a copy of your health care power of attorney and living will to the office to be added to your chart at your convenience.  ? ?Conditions/risks identified: Aim for 30 minutes of exercise or brisk walking, 6-8 glasses of water, and 5 servings of fruits and vegetables each day.  ? ?Next appointment: Follow up in one year for your annual wellness visit  ? ? ?Preventive Care 80 Years and Older, Female ?Preventive care refers to lifestyle choices and visits with your health care provider that can promote health and wellness. ?What does preventive care include? ?A yearly physical exam. This is also called an annual well check. ?Dental exams once or twice a year. ?Routine eye exams. Ask your health care provider how often you should have your eyes checked. ?Personal lifestyle choices, including: ?Daily care of your teeth and gums. ?Regular physical activity. ?Eating a healthy diet. ?Avoiding tobacco and drug use. ?Limiting alcohol use. ?Practicing safe sex. ?Taking low-dose aspirin every day. ?Taking vitamin and  mineral supplements as recommended by your health care provider. ?What happens during an annual well check? ?The services and screenings done by your health care provider during your annual well check will depend on your age, overall health, lifestyle risk factors, and family history of disease. ?Counseling  ?Your health care provider may ask you questions about your: ?Alcohol use. ?Tobacco use. ?Drug use. ?Emotional well-being. ?Home and relationship well-being. ?Sexual activity. ?Eating habits. ?History of falls. ?Memory and ability to understand (cognition). ?Work and work Astronomer. ?Reproductive health. ?Screening  ?You may have the following tests or measurements: ?Height, weight, and BMI. ?Blood pressure. ?Lipid and cholesterol levels. These may be checked every 5 years, or more frequently if you are over 56 years old. ?Skin check. ?Lung cancer screening. You may have this screening every year starting at age 57 if you have a 30-pack-year history of smoking and currently smoke or have quit within the past 15 years. ?Fecal occult blood test (FOBT) of the stool. You may have this test every year starting at age 96. ?Flexible sigmoidoscopy or colonoscopy. You may have a sigmoidoscopy every 5 years or a colonoscopy every 10 years starting at age 24. ?Hepatitis C blood test. ?Hepatitis B blood test. ?Sexually transmitted disease (STD) testing. ?Diabetes screening. This is done by checking your blood sugar (glucose) after you have not eaten for a while (fasting). You may have this done every 1-3 years. ?Bone density scan. This is done to screen for osteoporosis. You may have this done starting at age 10. ?Mammogram. This may be done every 1-2 years. Talk to your health care provider about how often you should have regular mammograms. ?Talk with your health care provider about your test results, treatment options,  and if necessary, the need for more tests. ?Vaccines  ?Your health care provider may recommend  certain vaccines, such as: ?Influenza vaccine. This is recommended every year. ?Tetanus, diphtheria, and acellular pertussis (Tdap, Td) vaccine. You may need a Td booster every 10 years. ?Zoster vaccine. You may need this after age 44. ?Pneumococcal 13-valent conjugate (PCV13) vaccine. One dose is recommended after age 27. ?Pneumococcal polysaccharide (PPSV23) vaccine. One dose is recommended after age 66. ?Talk to your health care provider about which screenings and vaccines you need and how often you need them. ?This information is not intended to replace advice given to you by your health care provider. Make sure you discuss any questions you have with your health care provider. ?Document Released: 09/14/2015 Document Revised: 05/07/2016 Document Reviewed: 06/19/2015 ?Elsevier Interactive Patient Education ? 2017 [place]. ? ?Fall Prevention in the Home ?Falls can cause injuries. They can happen to people of all ages. There are many things you can do to make your home safe and to help prevent falls. ?What can I do on the outside of my home? ?Regularly fix the edges of walkways and driveways and fix any cracks. ?Remove anything that might make you trip as you walk through a door, such as a raised step or threshold. ?Trim any bushes or trees on the path to your home. ?Use bright outdoor lighting. ?Clear any walking paths of anything that might make someone trip, such as rocks or tools. ?Regularly check to see if handrails are loose or broken. Make sure that both sides of any steps have handrails. ?Any raised decks and porches should have guardrails on the edges. ?Have any leaves, snow, or ice cleared regularly. ?Use sand or salt on walking paths during winter. ?Clean up any spills in your garage right away. This includes oil or grease spills. ?What can I do in the bathroom? ?Use night lights. ?Install grab bars by the toilet and in the tub and shower. Do not use towel bars as grab bars. ?Use non-skid mats or  decals in the tub or shower. ?If you need to sit down in the shower, use a plastic, non-slip stool. ?Keep the floor dry. Clean up any water that spills on the floor as soon as it happens. ?Remove soap buildup in the tub or shower regularly. ?Attach bath mats securely with double-sided non-slip rug tape. ?Do not have throw rugs and other things on the floor that can make you trip. ?What can I do in the bedroom? ?Use night lights. ?Make sure that you have a light by your bed that is easy to reach. ?Do not use any sheets or blankets that are too big for your bed. They should not hang down onto the floor. ?Have a firm chair that has side arms. You can use this for support while you get dressed. ?Do not have throw rugs and other things on the floor that can make you trip. ?What can I do in the kitchen? ?Clean up any spills right away. ?Avoid walking on wet floors. ?Keep items that you use a lot in easy-to-reach places. ?If you need to reach something above you, use a strong step stool that has a grab bar. ?Keep electrical cords out of the way. ?Do not use floor polish or wax that makes floors slippery. If you must use wax, use non-skid floor wax. ?Do not have throw rugs and other things on the floor that can make you trip. ?What can I do with my stairs? ?Do not leave  any items on the stairs. ?Make sure that there are handrails on both sides of the stairs and use them. Fix handrails that are broken or loose. Make sure that handrails are as long as the stairways. ?Check any carpeting to make sure that it is firmly attached to the stairs. Fix any carpet that is loose or worn. ?Avoid having throw rugs at the top or bottom of the stairs. If you do have throw rugs, attach them to the floor with carpet tape. ?Make sure that you have a light switch at the top of the stairs and the bottom of the stairs. If you do not have them, ask someone to add them for you. ?What else can I do to help prevent falls? ?Wear shoes that: ?Do not  have high heels. ?Have rubber bottoms. ?Are comfortable and fit you well. ?Are closed at the toe. Do not wear sandals. ?If you use a stepladder: ?Make sure that it is fully opened. Do not climb a closed stepladde

## 2021-12-21 DIAGNOSIS — I10 Essential (primary) hypertension: Secondary | ICD-10-CM | POA: Diagnosis not present

## 2021-12-21 DIAGNOSIS — M170 Bilateral primary osteoarthritis of knee: Secondary | ICD-10-CM | POA: Diagnosis not present

## 2021-12-21 DIAGNOSIS — E119 Type 2 diabetes mellitus without complications: Secondary | ICD-10-CM | POA: Diagnosis not present

## 2021-12-26 DIAGNOSIS — M65351 Trigger finger, right little finger: Secondary | ICD-10-CM | POA: Diagnosis not present

## 2021-12-26 DIAGNOSIS — M654 Radial styloid tenosynovitis [de Quervain]: Secondary | ICD-10-CM | POA: Diagnosis not present

## 2021-12-26 DIAGNOSIS — M20022 Boutonniere deformity of left finger(s): Secondary | ICD-10-CM | POA: Diagnosis not present

## 2021-12-26 DIAGNOSIS — M79645 Pain in left finger(s): Secondary | ICD-10-CM | POA: Diagnosis not present

## 2021-12-30 ENCOUNTER — Encounter: Payer: Self-pay | Admitting: Family Medicine

## 2021-12-30 ENCOUNTER — Ambulatory Visit (INDEPENDENT_AMBULATORY_CARE_PROVIDER_SITE_OTHER): Payer: Medicare Other | Admitting: Family Medicine

## 2021-12-30 VITALS — BP 130/72 | HR 65 | Temp 98.0°F | Ht 59.0 in | Wt 180.5 lb

## 2021-12-30 DIAGNOSIS — I10 Essential (primary) hypertension: Secondary | ICD-10-CM

## 2021-12-30 DIAGNOSIS — J309 Allergic rhinitis, unspecified: Secondary | ICD-10-CM | POA: Diagnosis not present

## 2021-12-30 DIAGNOSIS — R42 Dizziness and giddiness: Secondary | ICD-10-CM

## 2021-12-30 MED ORDER — LEVOCETIRIZINE DIHYDROCHLORIDE 5 MG PO TABS: 2.5000 mg | ORAL_TABLET | Freq: Every evening | ORAL | 0 refills | Status: DC

## 2021-12-30 MED ORDER — MECLIZINE HCL 12.5 MG PO TABS: 12.5000 mg | ORAL_TABLET | Freq: Three times a day (TID) | ORAL | 0 refills | Status: DC | PRN

## 2021-12-30 NOTE — Progress Notes (Signed)
? ?Acute Office Visit ? ?Subjective:  ? ?  ?Patient ID: Cynthia Mays, female    DOB: 1940/09/21, 81 y.o.   MRN: [id number] ? ?Chief Complaint  ?Patient presents with  ? Blood Pressure Check  ? ? ?HPI ?Patient is in today for elevated BP readings at home. Reports readings of 150s/100s. She denies chest pain, shortness of breath, visual changes, edema, orthopnea, or focal weakness. She has felt off balanced for the last few days, like vertigo which she has a history of. She has had increased nasal congestion, HA, and sinus pressure for the last few days. She reports a history of chronic allergies for which she takes allegra and flonase for. She denies fever or chills. Denies falls or head trauma.  ? ?ROS ?As per HPI.  ? ?   ?Objective:  ?  ?BP 130/72   Pulse 65   Temp 98 ?F (36.7 ?C) (Temporal)   Ht 4\' 11"  (1.499 m)   Wt 180 lb 8 oz (81.9 kg)   BMI 36.46 kg/m?  ?BP Readings from Last 3 Encounters:  ?12/30/21 130/72  ?11/11/21 118/65  ?10/01/21 (!) 143/85  ? ?  ? ?Physical Exam ?Vitals and nursing note reviewed.  ?Constitutional:   ?   General: She is not in acute distress. ?   Appearance: She is not ill-appearing, toxic-appearing or diaphoretic.  ?HENT:  ?   Head: Normocephalic and atraumatic.  ?   Right Ear: Tympanic membrane, ear canal and external ear normal.  ?   Left Ear: Tympanic membrane, ear canal and external ear normal.  ?   Nose: Congestion present.  ?   Right Sinus: No maxillary sinus tenderness or frontal sinus tenderness.  ?   Left Sinus: No maxillary sinus tenderness.  ?   Mouth/Throat:  ?   Mouth: Mucous membranes are moist.  ?   Pharynx: Oropharynx is clear.  ?Eyes:  ?   Extraocular Movements: Extraocular movements intact.  ?   Conjunctiva/sclera: Conjunctivae normal.  ?   Pupils: Pupils are equal, round, and reactive to light.  ?Neck:  ?   Vascular: No carotid bruit.  ?Cardiovascular:  ?   Rate and Rhythm: Normal rate and regular rhythm.  ?   Heart sounds: Normal heart sounds. No murmur  heard. ?Pulmonary:  ?   Effort: Pulmonary effort is normal. No respiratory distress.  ?   Breath sounds: Normal breath sounds.  ?Musculoskeletal:  ?   Right lower leg: No edema.  ?   Left lower leg: No edema.  ?Skin: ?   General: Skin is warm and dry.  ?Neurological:  ?   General: No focal deficit present.  ?   Mental Status: She is alert and oriented to person, place, and time.  ?   Cranial Nerves: No cranial nerve deficit.  ?   Sensory: No sensory deficit.  ?   Motor: No weakness.  ?   Coordination: Coordination normal.  ?   Gait: Gait normal.  ?Psychiatric:     ?   Mood and Affect: Mood normal.     ?   Behavior: Behavior normal.  ? ? ?No results found for any visits on 12/30/21. ? ? ?   ?Assessment & Plan:  ? ?Juliaann was seen today for blood pressure check. ? ?Diagnoses and all orders for this visit: ? ?Essential hypertension ?Reporting elevated home readings. Well controlled today in office. Continue to monitor BP at home. Will have patient schedule nurse visit for BP and bring  her monitor from home to check its accuracy.  ? ?Chronic allergic rhinitis ?Will try switching from allegra to xyzal. Continue flonase.  ?-     levocetirizine (XYZAL) 5 MG tablet; Take 0.5 tablets (2.5 mg total) by mouth every evening. ? ?Vertigo ?Normal neuro exam today. Meclizine as below.  ?-     meclizine (ANTIVERT) 12.5 MG tablet; Take 1 tablet (12.5 mg total) by mouth 3 (three) times daily as needed for dizziness. ? ?Return for schedule nurse visit for BP check, bring monitor from home. ? ?The patient indicates understanding of these issues and agrees with the plan. ? ?Gwenlyn Perking, FNP ? ? ?

## 2021-12-30 NOTE — Patient Instructions (Signed)
Vertigo Vertigo is the feeling that you or your surroundings are moving when they are not. This feeling can come and go at any time. Vertigo often goes away on its own. Vertigo can be dangerous if it occurs while you are doing something that could endanger yourself or others, such as driving or operating machinery. Your health care provider will do tests to try to determine the cause of your vertigo. Tests will also help your health care provider decide how best to treat your condition. Follow these instructions at home: Eating and drinking     Dehydration can make vertigo worse. Drink enough fluid to keep your urine pale yellow. Do not drink alcohol. Activity Return to your normal activities as told by your health care provider. Ask your health care provider what activities are safe for you. In the morning, first sit up on the side of the bed. When you feel okay, stand slowly while you hold onto something until you know that your balance is fine. Move slowly. Avoid sudden body or head movements or certain positions, as told by your health care provider. If you have trouble walking or keeping your balance, try using a cane for stability. If you feel dizzy or unstable, sit down right away. Avoid doing any tasks that would cause danger to you or others if vertigo occurs. Avoid bending down if you feel dizzy. Place items in your home so that they are easy for you to reach without bending or leaning over. Do not drive or use machinery if you feel dizzy. General instructions Take over-the-counter and prescription medicines only as told by your health care provider. Keep all follow-up visits. This is important. Contact a health care provider if: Your medicines do not relieve your vertigo or they make it worse. Your condition gets worse or you develop new symptoms. You have a fever. You develop nausea or vomiting, or if nausea gets worse. Your family or friends notice any behavioral changes. You  have numbness or a prickling and tingling sensation in part of your body. Get help right away if you: Are always dizzy or you faint. Develop severe headaches. Develop a stiff neck. Develop sensitivity to light. Have difficulty moving or speaking. Have weakness in your hands, arms, or legs. Have changes in your hearing or vision. These symptoms may represent a serious problem that is an emergency. Do not wait to see if the symptoms will go away. Get medical help right away. Call your local emergency services (911 in the [place] Do not drive yourself to the hospital. Summary Vertigo is the feeling that you or your surroundings are moving when they are not. Your health care provider will do tests to try to determine the cause of your vertigo. Follow instructions for home care. You may be told to avoid certain tasks, positions, or movements. Contact a health care provider if your medicines do not relieve your symptoms, or if you have a fever, nausea, vomiting, or changes in behavior. Get help right away if you have severe headaches or difficulty speaking, or you develop hearing or vision problems. This information is not intended to replace advice given to you by your health care provider. Make sure you discuss any questions you have with your health care provider. Document Revised: 07/18/2020 Document Reviewed: 07/18/2020 Elsevier Patient Education  2023 [employer]  

## 2022-01-21 DIAGNOSIS — E7800 Pure hypercholesterolemia, unspecified: Secondary | ICD-10-CM | POA: Diagnosis not present

## 2022-01-21 DIAGNOSIS — I10 Essential (primary) hypertension: Secondary | ICD-10-CM | POA: Diagnosis not present

## 2022-01-21 DIAGNOSIS — E119 Type 2 diabetes mellitus without complications: Secondary | ICD-10-CM | POA: Diagnosis not present

## 2022-01-24 ENCOUNTER — Other Ambulatory Visit: Payer: Self-pay | Admitting: Family

## 2022-01-24 DIAGNOSIS — E039 Hypothyroidism, unspecified: Secondary | ICD-10-CM

## 2022-01-24 DIAGNOSIS — Z7689 Persons encountering health services in other specified circumstances: Secondary | ICD-10-CM

## 2022-01-24 DIAGNOSIS — E119 Type 2 diabetes mellitus without complications: Secondary | ICD-10-CM

## 2022-01-24 DIAGNOSIS — G4700 Insomnia, unspecified: Secondary | ICD-10-CM

## 2022-01-24 DIAGNOSIS — E1169 Type 2 diabetes mellitus with other specified complication: Secondary | ICD-10-CM

## 2022-01-30 ENCOUNTER — Other Ambulatory Visit: Payer: Self-pay | Admitting: Family

## 2022-01-30 DIAGNOSIS — I10 Essential (primary) hypertension: Secondary | ICD-10-CM

## 2022-02-04 DIAGNOSIS — M2042 Other hammer toe(s) (acquired), left foot: Secondary | ICD-10-CM | POA: Diagnosis not present

## 2022-02-04 DIAGNOSIS — M79675 Pain in left toe(s): Secondary | ICD-10-CM | POA: Diagnosis not present

## 2022-02-11 ENCOUNTER — Encounter: Payer: Self-pay | Admitting: Family

## 2022-02-11 ENCOUNTER — Ambulatory Visit (INDEPENDENT_AMBULATORY_CARE_PROVIDER_SITE_OTHER): Payer: Medicare Other | Admitting: Family

## 2022-02-11 VITALS — BP 131/78 | HR 69 | Temp 98.0°F | Ht 59.0 in | Wt 182.6 lb

## 2022-02-11 DIAGNOSIS — I10 Essential (primary) hypertension: Secondary | ICD-10-CM

## 2022-02-11 DIAGNOSIS — E039 Hypothyroidism, unspecified: Secondary | ICD-10-CM | POA: Diagnosis not present

## 2022-02-11 DIAGNOSIS — I2583 Coronary atherosclerosis due to lipid rich plaque: Secondary | ICD-10-CM

## 2022-02-11 DIAGNOSIS — E1169 Type 2 diabetes mellitus with other specified complication: Secondary | ICD-10-CM | POA: Diagnosis not present

## 2022-02-11 DIAGNOSIS — E785 Hyperlipidemia, unspecified: Secondary | ICD-10-CM | POA: Diagnosis not present

## 2022-02-11 DIAGNOSIS — G8929 Other chronic pain: Secondary | ICD-10-CM

## 2022-02-11 DIAGNOSIS — K219 Gastro-esophageal reflux disease without esophagitis: Secondary | ICD-10-CM | POA: Diagnosis not present

## 2022-02-11 DIAGNOSIS — Z79899 Other long term (current) drug therapy: Secondary | ICD-10-CM

## 2022-02-11 DIAGNOSIS — M5440 Lumbago with sciatica, unspecified side: Secondary | ICD-10-CM | POA: Diagnosis not present

## 2022-02-11 DIAGNOSIS — G4700 Insomnia, unspecified: Secondary | ICD-10-CM | POA: Diagnosis not present

## 2022-02-11 DIAGNOSIS — Z7689 Persons encountering health services in other specified circumstances: Secondary | ICD-10-CM

## 2022-02-11 DIAGNOSIS — I2510 Atherosclerotic heart disease of native coronary artery without angina pectoris: Secondary | ICD-10-CM

## 2022-02-11 DIAGNOSIS — F411 Generalized anxiety disorder: Secondary | ICD-10-CM

## 2022-02-11 MED ORDER — TRAZODONE HCL 50 MG PO TABS: 50.0000 mg | ORAL_TABLET | Freq: Every day | ORAL | 2 refills | Status: DC

## 2022-02-11 MED ORDER — ROSUVASTATIN CALCIUM 5 MG PO TABS
5.0000 mg | ORAL_TABLET | Freq: Every day | ORAL | 1 refills | Status: DC
Start: 2022-02-11 — End: 2022-04-04

## 2022-02-11 MED ORDER — SERTRALINE HCL 50 MG PO TABS: ORAL_TABLET | ORAL | 2 refills | Status: DC

## 2022-02-11 MED ORDER — LEVOTHYROXINE SODIUM 112 MCG PO TABS: 112.0000 ug | ORAL_TABLET | Freq: Every day | ORAL | 1 refills | Status: DC

## 2022-02-11 MED ORDER — DAPAGLIFLOZIN PROPANEDIOL 5 MG PO TABS: 5.0000 mg | ORAL_TABLET | Freq: Every day | ORAL | 1 refills | Status: DC

## 2022-02-11 MED ORDER — DIAZEPAM 5 MG PO TABS: 5.0000 mg | ORAL_TABLET | Freq: Four times a day (QID) | ORAL | 2 refills | Status: DC | PRN

## 2022-02-11 NOTE — Patient Instructions (Signed)
Health Maintenance After Age 80 After age 80, you are at a higher risk for certain long-term diseases and infections as well as injuries from falls. Falls are a major cause of broken bones and head injuries in people who are older than age 80. Getting regular preventive care can help to keep you healthy and well. Preventive care includes getting regular testing and making lifestyle changes as recommended by your health care provider. Talk with your health care provider about: Which screenings and tests you should have. A screening is a test that checks for a disease when you have no symptoms. A diet and exercise plan that is right for you. What should I know about screenings and tests to prevent falls? Screening and testing are the best ways to find a health problem early. Early diagnosis and treatment give you the best chance of managing medical conditions that are common after age 80. Certain conditions and lifestyle choices may make you more likely to have a fall. Your health care provider may recommend: Regular vision checks. Poor vision and conditions such as cataracts can make you more likely to have a fall. If you wear glasses, make sure to get your prescription updated if your vision changes. Medicine review. Work with your health care provider to regularly review all of the medicines you are taking, including over-the-counter medicines. Ask your health care provider about any side effects that may make you more likely to have a fall. Tell your health care provider if any medicines that you take make you feel dizzy or sleepy. Strength and balance checks. Your health care provider may recommend certain tests to check your strength and balance while standing, walking, or changing positions. Foot health exam. Foot pain and numbness, as well as not wearing proper footwear, can make you more likely to have a fall. Screenings, including: Osteoporosis screening. Osteoporosis is a condition that causes  the bones to get weaker and break more easily. Blood pressure screening. Blood pressure changes and medicines to control blood pressure can make you feel dizzy. Depression screening. You may be more likely to have a fall if you have a fear of falling, feel depressed, or feel unable to do activities that you used to do. Alcohol use screening. Using too much alcohol can affect your balance and may make you more likely to have a fall. Follow these instructions at home: Lifestyle Do not drink alcohol if: Your health care provider tells you not to drink. If you drink alcohol: Limit how much you have to: 0-1 drink a day for women. 0-2 drinks a day for men. Know how much alcohol is in your drink. In the [place] one drink equals one 12 oz bottle of beer (355 mL), one 5 oz glass of wine (148 mL), or one 1 oz glass of hard liquor (44 mL). Do not use any products that contain nicotine or tobacco. These products include cigarettes, chewing tobacco, and vaping devices, such as e-cigarettes. If you need help quitting, ask your health care provider. Activity  Follow a regular exercise program to stay fit. This will help you maintain your balance. Ask your health care provider what types of exercise are appropriate for you. If you need a cane or walker, use it as recommended by your health care provider. Wear supportive shoes that have nonskid soles. Safety  Remove any tripping hazards, such as rugs, cords, and clutter. Install safety equipment such as grab bars in bathrooms and safety rails on stairs. Keep rooms and walkways   well-lit. General instructions Talk with your health care provider about your risks for falling. Tell your health care provider if: You fall. Be sure to tell your health care provider about all falls, even ones that seem minor. You feel dizzy, tiredness (fatigue), or off-balance. Take over-the-counter and prescription medicines only as told by your health care provider. These include  supplements. Eat a healthy diet and maintain a healthy weight. A healthy diet includes low-fat dairy products, low-fat (lean) meats, and fiber from whole grains, beans, and lots of fruits and vegetables. Stay current with your vaccines. Schedule regular health, dental, and eye exams. Summary Having a healthy lifestyle and getting preventive care can help to protect your health and wellness after age 80. Screening and testing are the best way to find a health problem early and help you avoid having a fall. Early diagnosis and treatment give you the best chance for managing medical conditions that are more common for people who are older than age 80. Falls are a major cause of broken bones and head injuries in people who are older than age 80. Take precautions to prevent a fall at home. Work with your health care provider to learn what changes you can make to improve your health and wellness and to prevent falls. This information is not intended to replace advice given to you by your health care provider. Make sure you discuss any questions you have with your health care provider. Document Revised: 01/07/2021 Document Reviewed: 01/07/2021 Elsevier Patient Education  2023 Elsevier Inc.  

## 2022-02-11 NOTE — Progress Notes (Signed)
Subjective:    Patient ID: Cynthia Mays, female    DOB: Jul 10, 1941, 80 y.o.   MRN: [id number]  Chief Complaint  Patient presents with   Medical Management of Chronic Issues   Fatigue    Ongoing    PT presents to the office today for chronic follow up. She is morbid obese with a BMI of 36 and DM and HTN.  Hypertension This is a chronic problem. The current episode started more than 1 year ago. The problem has been resolved since onset. The problem is controlled. Associated symptoms include anxiety and malaise/fatigue. Pertinent negatives include no blurred vision, peripheral edema or shortness of breath. Risk factors for coronary artery disease include dyslipidemia and obesity. The current treatment provides moderate improvement. Identifiable causes of hypertension include a thyroid problem.  Gastroesophageal Reflux She complains of belching and heartburn. This is a chronic problem. The current episode started more than 1 year ago. The problem occurs occasionally. Associated symptoms include fatigue. She has tried a PPI for the symptoms. The treatment provided moderate relief.  Thyroid Problem Presents for follow-up visit. Symptoms include anxiety and fatigue. Patient reports no depressed mood or dry skin. The symptoms have been stable. Her past medical history is significant for hyperlipidemia.  Hyperlipidemia This is a chronic problem. The current episode started more than 1 year ago. The problem is controlled. Exacerbating diseases include obesity. Pertinent negatives include no shortness of breath. Current antihyperlipidemic treatment includes statins. The current treatment provides moderate improvement of lipids. Risk factors for coronary artery disease include dyslipidemia, diabetes mellitus, hypertension, a sedentary lifestyle and post-menopausal.  Diabetes She presents for her follow-up diabetic visit. Hypoglycemia symptoms include nervousness/anxiousness. Associated symptoms  include fatigue. Pertinent negatives for diabetes include no blurred vision and no foot paresthesias. Symptoms are stable. Diabetic complications include heart disease. Risk factors for coronary artery disease include dyslipidemia, diabetes mellitus, hypertension and sedentary lifestyle. (Does not check regularly )  Insomnia Primary symptoms: difficulty falling asleep, frequent awakening, malaise/fatigue.   The current episode started more than one year. The onset quality is gradual.  Anxiety Presents for follow-up visit. Symptoms include excessive worry, insomnia, irritability and nervous/anxious behavior. Patient reports no depressed mood or shortness of breath. Symptoms occur most days. The severity of symptoms is moderate.        Review of Systems  Constitutional:  Positive for fatigue, irritability and malaise/fatigue.  Eyes:  Negative for blurred vision.  Respiratory:  Negative for shortness of breath.   Gastrointestinal:  Positive for heartburn.  Psychiatric/Behavioral:  The patient is nervous/anxious and has insomnia.   All other systems reviewed and are negative.      Objective:   Physical Exam Vitals reviewed.  Constitutional:      General: She is not in acute distress.    Appearance: She is well-developed. She is obese.  HENT:     Head: Normocephalic and atraumatic.     Right Ear: Tympanic membrane normal.     Left Ear: Tympanic membrane normal.  Eyes:     Pupils: Pupils are equal, round, and reactive to light.  Neck:     Thyroid: No thyromegaly.  Cardiovascular:     Rate and Rhythm: Normal rate and regular rhythm.     Heart sounds: Normal heart sounds. No murmur heard. Pulmonary:     Effort: Pulmonary effort is normal. No respiratory distress.     Breath sounds: Normal breath sounds. No wheezing.  Abdominal:     General: Bowel sounds are normal.  There is no distension.     Palpations: Abdomen is soft.     Tenderness: There is no abdominal tenderness.   Musculoskeletal:        General: No tenderness. Normal range of motion.     Cervical back: Normal range of motion and neck supple.  Skin:    General: Skin is warm and dry.  Neurological:     Mental Status: She is alert and oriented to person, place, and time.     Cranial Nerves: No cranial nerve deficit.     Deep Tendon Reflexes: Reflexes are normal and symmetric.  Psychiatric:        Behavior: Behavior normal.        Thought Content: Thought content normal.        Judgment: Judgment normal.       BP 131/78   Pulse 69   Temp 98 F (36.7 C) (Temporal)   Ht 4\' 11"  (1.499 m)   Wt 182 lb 9.6 oz (82.8 kg)   SpO2 97%   BMI 36.88 kg/m      Assessment & Plan:  Cynthia Mays comes in today with chief complaint of Medical Management of Chronic Issues and Fatigue (Ongoing )   Diagnosis and orders addressed:  1. Acquired hypothyroidism - levothyroxine (SYNTHROID) 112 MCG tablet; Take 1 tablet (112 mcg total) by mouth daily before breakfast.  Dispense: 90 tablet; Refill: 1  2. Anxiety, generalized - diazepam (VALIUM) 5 MG tablet; Take 1 tablet (5 mg total) by mouth every 6 (six) hours as needed for anxiety or muscle spasms.  Dispense: 30 tablet; Refill: 2 - sertraline (ZOLOFT) 50 MG tablet; TAKE 1 & 1/2 (ONE & ONE-HALF) TABLETS BY MOUTH ONCE DAILY  Dispense: 135 tablet; Refill: 2  3. Chronic use of benzodiazepine for therapeutic purpose - diazepam (VALIUM) 5 MG tablet; Take 1 tablet (5 mg total) by mouth every 6 (six) hours as needed for anxiety or muscle spasms.  Dispense: 30 tablet; Refill: 2  4. Hyperlipidemia associated with type 2 diabetes mellitus (HCC) - rosuvastatin (CRESTOR) 5 MG tablet; Take 1 tablet (5 mg total) by mouth daily.  Dispense: 90 tablet; Refill: 1  5. Encounter to establish care  6. Insomnia, unspecified type  - traZODone (DESYREL) 50 MG tablet; Take 1-2 tablets (50-100 mg total) by mouth at bedtime.  Dispense: 180 tablet; Refill: 2 - sertraline  (ZOLOFT) 50 MG tablet; TAKE 1 & 1/2 (ONE & ONE-HALF) TABLETS BY MOUTH ONCE DAILY  Dispense: 135 tablet; Refill: 2  7. Essential hypertension  8. Coronary artery disease due to lipid rich plaque  9. Gastroesophageal reflux disease without esophagitis  10. Type 2 diabetes mellitus with other specified complication, without long-term current use of insulin (HCC) - dapagliflozin propanediol (FARXIGA) 5 MG TABS tablet; Take 1 tablet (5 mg total) by mouth daily before breakfast.  Dispense: 90 tablet; Refill: 1  11. Chronic left-sided low back pain with sciatica, sciatica laterality unspecified  12. Morbid obesity (HCC)   Labs reviewed from last visit, will do next office visit.  Patient reviewed in [place] controlled database, no flags noted. Contract and drug screen are up to date.  Health Maintenance reviewed Diet and exercise encouraged  Follow up plan: 3 months    [name], FNP

## 2022-02-27 DIAGNOSIS — E119 Type 2 diabetes mellitus without complications: Secondary | ICD-10-CM | POA: Diagnosis not present

## 2022-02-27 DIAGNOSIS — E7800 Pure hypercholesterolemia, unspecified: Secondary | ICD-10-CM | POA: Diagnosis not present

## 2022-02-27 DIAGNOSIS — I10 Essential (primary) hypertension: Secondary | ICD-10-CM | POA: Diagnosis not present

## 2022-03-30 DIAGNOSIS — E119 Type 2 diabetes mellitus without complications: Secondary | ICD-10-CM | POA: Diagnosis not present

## 2022-03-30 DIAGNOSIS — I10 Essential (primary) hypertension: Secondary | ICD-10-CM | POA: Diagnosis not present

## 2022-03-30 DIAGNOSIS — E7800 Pure hypercholesterolemia, unspecified: Secondary | ICD-10-CM | POA: Diagnosis not present

## 2022-04-01 ENCOUNTER — Other Ambulatory Visit: Payer: Self-pay | Admitting: Obstetrics & Gynecology

## 2022-04-01 DIAGNOSIS — N838 Other noninflammatory disorders of ovary, fallopian tube and broad ligament: Secondary | ICD-10-CM

## 2022-04-02 ENCOUNTER — Ambulatory Visit (INDEPENDENT_AMBULATORY_CARE_PROVIDER_SITE_OTHER): Payer: Medicare Other

## 2022-04-02 DIAGNOSIS — N838 Other noninflammatory disorders of ovary, fallopian tube and broad ligament: Secondary | ICD-10-CM

## 2022-04-02 NOTE — Progress Notes (Signed)
PELVIC US TA/TV: heterogeneous anteverted uterus with multiple fibroids,(#1) calcified fundal right subserosal fibroid 3.1 x 2.9 x 2.9 cm,(#2) anterior intramural fibroid 1.3 x .9 x 1.3 cm,EEC 2.1 mm,complex unilocular left ovarian cyst with low level echoes 3.5 x 3.6 x 3.4 cm,normal right ovary,no free fluid,no pain during ultrasound  Chaperone Peggy

## 2022-04-03 ENCOUNTER — Encounter: Payer: Self-pay | Admitting: Orthopaedic Surgery

## 2022-04-03 ENCOUNTER — Ambulatory Visit (INDEPENDENT_AMBULATORY_CARE_PROVIDER_SITE_OTHER): Payer: Medicare Other | Admitting: Orthopaedic Surgery

## 2022-04-03 VITALS — BP 123/68 | HR 61 | Ht 59.0 in | Wt 185.0 lb

## 2022-04-03 DIAGNOSIS — M25511 Pain in right shoulder: Secondary | ICD-10-CM

## 2022-04-03 DIAGNOSIS — G8929 Other chronic pain: Secondary | ICD-10-CM | POA: Diagnosis not present

## 2022-04-03 NOTE — Progress Notes (Signed)
PROCEDURE NOTE:  The patient request injection, verbal consent was obtained.  The right shoulder was prepped appropriately after time out was performed.   Sterile technique was observed and injection of 1 cc of DepoMedrol 40mg  with several cc's of plain xylocaine. Anesthesia was provided by ethyl chloride and a 20-gauge needle was used to inject the shoulder area. A posterior approach was used.  The injection was tolerated well.  A band aid dressing was applied.  The patient was advised to apply ice later today and tomorrow to the injection sight as needed.  Encounter Diagnosis  Name Primary?   Chronic right shoulder pain Yes   Return in one month.  Call if any problem.  Precautions discussed.  Electronically Signed , MD 8/3/20239:21 AM

## 2022-04-04 ENCOUNTER — Ambulatory Visit (INDEPENDENT_AMBULATORY_CARE_PROVIDER_SITE_OTHER): Payer: Medicare Other | Admitting: Obstetrics & Gynecology

## 2022-04-04 ENCOUNTER — Encounter: Payer: Self-pay | Admitting: Obstetrics & Gynecology

## 2022-04-04 VITALS — BP 134/81 | HR 72 | Ht 59.0 in | Wt 185.5 lb

## 2022-04-04 DIAGNOSIS — N838 Other noninflammatory disorders of ovary, fallopian tube and broad ligament: Secondary | ICD-10-CM | POA: Diagnosis not present

## 2022-04-04 NOTE — Progress Notes (Signed)
Follow up appointment for results: Left ovarian mass  Chief Complaint  Patient presents with   discuss [place] results    Blood pressure 134/81, pulse 72, height 4\' 11"  (1.499 m), weight 185 lb 8 oz (84.1 kg).  PELVIC COMPLETE WITH TRANSVAGINAL  Result Date: 04/02/2022  .10/2/2023an [facility] Financial trader) accredited practice Center for [facility] [address] [place] Ordering Provider: [id number], MD                                                                                           GYNECOLOGIC SONOGRAM Cynthia Mays is a 81 y.o. No obstetric history on file. No LMP recorded. Patient is postmenopausal. She is here for a pelvic sonogram to follow up left ovarian cyst. Uterus                      4.9 x 3.4 x 5.3 cm, Total uterine volume 47 cc,heterogeneous anteverted uterus with multiple fibroids,(#1) calcified fundal right subserosal fibroid 3.1 x 2.9 x 2.9 cm,(#2) anterior intramural fibroid 1.3 x .9 x 1.3 cm Endometrium          2.1 mm, symmetrical, WNL Right ovary             1.7 x 2 x 1.5 cm, WNL Left ovary                4.2 x 4.4 x 4.1 cm, complex unilocular left ovarian cyst with low level echoes 3.5 x 3.6 x 3.4 cm No free fluid Technician Comments: PELVIC 96 TA/TV: heterogeneous anteverted uterus with multiple fibroids,(#1) calcified fundal right subserosal fibroid 3.1 x 2.9 x 2.9 cm,(#2) anterior intramural fibroid 1.3 x .9 x 1.3 cm,EEC 2.1 mm,complex unilocular left ovarian cyst with low level echoes 3.5 x 3.6 x 3.4 cm,normal right ovary,no free fluid,no pain during ultrasound Chaperone [place] 04/02/2022 12:00 PM  Clinical Impression and recommendations: I have reviewed the sonogram results above, combined with the patient's current clinical course, below are my impressions and any appropriate recommendations for management based on the sonographic findings. Uterus small size consistent with menopause, stable  fibroids Endometrium thin consistent with menopause Ovaries: stable left ovarian cyst, unchanged from exam 09/2021, normal CA 125 at that time, right ovary normal unchanged Recommend repeat scan 1 year to ensure longer term stability 10/2021 04/02/2022 3:29 PM      MEDS ordered this encounter: No orders of the defined types were placed in this encounter.   Orders for this encounter: Orders Placed This Encounter  Procedures   CA 125    Impression + Management Plan   ICD-10-CM   1. Ovarian mass, left, stable, with normal CA 125  N83.8 CA 125   repeat CA 125, then follow up 1 year with sonogram CA 125      Follow Up: Return in about 1 year (around 04/05/2023) for GYN sono, Follow up, with Dr 06/05/2023.     All questions were answered.  Past Medical History:  Diagnosis Date   Complication of anesthesia  Depression    Diabetes mellitus    Fibromyalgia    GERD (gastroesophageal reflux disease)    Hiatal hernia    Hyperlipidemia    Hypertension    Hypothyroidism    Hypothyroidism 07/23/2016   PONV (postoperative nausea and vomiting)     Past Surgical History:  Procedure Laterality Date   APPENDECTOMY  2002   arthroscopy  right 2002, left 2007   bilateral knees   BACK SURGERY  10/2007, 09/2008   x2   BIOPSY  10/02/2016   Procedure: BIOPSY;  Surgeon: Rogene Houston, MD;  [facility];  Service: Endoscopy;;  gastric   BIOPSY  05/04/2020   Procedure: BIOPSY;  Surgeon: Harvel Quale, MD;  Location: [facility];  Service: Gastroenterology;;   [place]   bilateral   [place]   x2   COLONOSCOPY WITH PROPOFOL N/A 05/04/2020   [place]aneda:The examined portion of the ileum was normal(focally active, nonspecific ileitis, neg for features of chronicity or granulomas)- A single (solitary) ulcer in the cecum.(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 tortuous colon, non bleeding internal hemorrhoids   ESOPHAGOGASTRODUODENOSCOPY N/A 10/02/2016   Procedure: ESOPHAGOGASTRODUODENOSCOPY (EGD);  Surgeon: Rogene Houston, MD;  [facility]: [facility];  Service: Endoscopy;  Laterality: N/A;  11:15 - moved to 2/1 @ 3:00   ESOPHAGOGASTRODUODENOSCOPY (EGD) WITH PROPOFOL N/A 05/04/2020   Castaneda: normal esophagus, 4cm hh with four cameron ulcers, multiple gastric polyps-fundic gland polyps, erythematous mucosa in pylorus, gastric antral mucosa and oxyntic mucosa with mild chronic gastritis and reactive changes, neg h pylori, normal examined duodenum   KNEE SURGERY     both    OB History   No obstetric history on file.     Allergies  Allergen Reactions   Codeine Other (See Comments)    Headache, gi upset   Penicillins Rash    Has patient had a PCN reaction causing immediate rash, facial/tongue/throat swelling, SOB or lightheadedness with hypotension: Yes Has patient had a PCN reaction causing severe rash involving mucus membranes or skin necrosis: No Has patient had a PCN reaction that required hospitalization No Has patient had a PCN reaction occurring within the last 10 years: No If all of the above answers are "NO", then may proceed with Cephalosporin use.    Pravastatin     Leg cramps    Social History   Socioeconomic History   Marital status: Widowed    Spouse name: Not on file   Number of children: 4   Years of education: 24   Highest education level: 12th grade  Occupational History    Employer: RETIRED  Tobacco Use   Smoking status: Former    Packs/day: 1.00    Years: 43.00    Total pack years: 43.00    Types: Cigarettes    Quit date: 05/02/1970    Years since quitting: 51.9   Smokeless tobacco: Never   Tobacco comments:    > 20 years quit  Vaping Use   Vaping Use: Never used  Substance and Sexual Activity   Alcohol use: No    Comment: occasionally wine   Drug use: No    Sexual activity: Not Currently    Birth control/protection: Post-menopausal  Other Topics Concern   Not on file  Social History Narrative   Lives alone.  Widowed    1 dog, 2 cats   2  daughters, 1 local   Enjoys dancing and sewing   Daughter lives 5 miles away, grandchildren grown and visit often   Social Determinants of Health   Financial Resource Strain: Low Risk  (12/02/2021)   Overall Financial Resource Strain (CARDIA)    Difficulty of Paying Living Expenses: Not very hard  Food Insecurity: No Food Insecurity (12/02/2021)   Hunger Vital Sign    Worried About Running Out of Food in the Last Year: Never true    Ran Out of Food in the Last Year: Never true  Transportation Needs: No Transportation Needs (12/02/2021)   PRAPARE - Administrator, Civil Service (Medical): No    Lack of Transportation (Non-Medical): No  Physical Activity: Insufficiently Active (12/02/2021)   Exercise Vital Sign    Days of Exercise per Week: 7 days    Minutes of Exercise per Session: 20 min  Stress: No Stress Concern Present (12/02/2021)   [employer] of Occupational Health - Occupational Stress Questionnaire    Feeling of Stress : Only a little  Social Connections: Moderately Integrated (12/02/2021)   Social Connection and Isolation Panel [NHANES]    Frequency of Communication with Friends and Family: More than three times a week    Frequency of Social Gatherings with Friends and Family: Once a week    Attends Religious Services: More than 4 times per year    Active Member of [employer] or Organizations: Yes    Attends Banker Meetings: More than 4 times per year    Marital Status: Widowed    Family History  Problem Relation Age of Onset   Anesthesia problems Mother    CAD Mother 79   Cancer Mother        Pancreatic cancer   Hypertension Father    Suicidality Father        gunshot   Deafness Sister    Heart disease Brother    Suicidality Son    Cancer Sister         female    Suicidality Brother    Hypotension Neg Hx    Pseudochol deficiency Neg Hx    Malignant hyperthermia Neg Hx

## 2022-04-05 LAB — CA 125: Cancer Antigen (CA) 125: 9.1 U/mL (ref 0.0–38.1)

## 2022-05-01 ENCOUNTER — Encounter: Payer: Self-pay | Admitting: Orthopaedic Surgery

## 2022-05-01 ENCOUNTER — Ambulatory Visit (INDEPENDENT_AMBULATORY_CARE_PROVIDER_SITE_OTHER): Payer: Medicare Other | Admitting: Orthopaedic Surgery

## 2022-05-01 DIAGNOSIS — G8929 Other chronic pain: Secondary | ICD-10-CM

## 2022-05-01 DIAGNOSIS — M25511 Pain in right shoulder: Secondary | ICD-10-CM

## 2022-05-01 NOTE — Progress Notes (Signed)
PROCEDURE NOTE:  The patient request injection, verbal consent was obtained.  The right shoulder was prepped appropriately after time out was performed.   Sterile technique was observed and injection of 1 cc of DepoMedrol 40mg  with several cc's of plain xylocaine. Anesthesia was provided by ethyl chloride and a 20-gauge needle was used to inject the shoulder area. A posterior approach was used.  The injection was tolerated well.  A band aid dressing was applied.  The patient was advised to apply ice later today and tomorrow to the injection sight as needed.  Encounter Diagnosis  Name Primary?   Chronic right shoulder pain Yes   Return in one month.  Call if any problem.  Precautions discussed.  Electronically Signed , MD 8/31/20239:07 AM

## 2022-05-16 ENCOUNTER — Encounter: Payer: Self-pay | Admitting: Family

## 2022-05-16 ENCOUNTER — Ambulatory Visit (INDEPENDENT_AMBULATORY_CARE_PROVIDER_SITE_OTHER): Payer: Medicare Other | Admitting: Family

## 2022-05-16 VITALS — BP 119/74 | HR 68 | Temp 97.3°F | Ht 59.0 in | Wt 187.8 lb

## 2022-05-16 DIAGNOSIS — E039 Hypothyroidism, unspecified: Secondary | ICD-10-CM | POA: Diagnosis not present

## 2022-05-16 DIAGNOSIS — Z23 Encounter for immunization: Secondary | ICD-10-CM

## 2022-05-16 DIAGNOSIS — K219 Gastro-esophageal reflux disease without esophagitis: Secondary | ICD-10-CM

## 2022-05-16 DIAGNOSIS — M5440 Lumbago with sciatica, unspecified side: Secondary | ICD-10-CM

## 2022-05-16 DIAGNOSIS — G4700 Insomnia, unspecified: Secondary | ICD-10-CM | POA: Diagnosis not present

## 2022-05-16 DIAGNOSIS — I2510 Atherosclerotic heart disease of native coronary artery without angina pectoris: Secondary | ICD-10-CM | POA: Diagnosis not present

## 2022-05-16 DIAGNOSIS — R1030 Lower abdominal pain, unspecified: Secondary | ICD-10-CM

## 2022-05-16 DIAGNOSIS — I2583 Coronary atherosclerosis due to lipid rich plaque: Secondary | ICD-10-CM | POA: Diagnosis not present

## 2022-05-16 DIAGNOSIS — F411 Generalized anxiety disorder: Secondary | ICD-10-CM

## 2022-05-16 DIAGNOSIS — G8929 Other chronic pain: Secondary | ICD-10-CM

## 2022-05-16 DIAGNOSIS — Z79899 Other long term (current) drug therapy: Secondary | ICD-10-CM | POA: Diagnosis not present

## 2022-05-16 DIAGNOSIS — E1169 Type 2 diabetes mellitus with other specified complication: Secondary | ICD-10-CM | POA: Diagnosis not present

## 2022-05-16 DIAGNOSIS — E785 Hyperlipidemia, unspecified: Secondary | ICD-10-CM

## 2022-05-16 DIAGNOSIS — I10 Essential (primary) hypertension: Secondary | ICD-10-CM

## 2022-05-16 LAB — BAYER DCA HB A1C WAIVED: HB A1C (BAYER DCA - WAIVED): 7 % — ABNORMAL HIGH (ref 4.8–5.6)

## 2022-05-16 MED ORDER — HYDROCODONE-ACETAMINOPHEN 5-325 MG PO TABS: 1.0000 | ORAL_TABLET | Freq: Four times a day (QID) | ORAL | 0 refills | Status: DC | PRN

## 2022-05-16 MED ORDER — DIAZEPAM 5 MG PO TABS: 5.0000 mg | ORAL_TABLET | Freq: Four times a day (QID) | ORAL | 2 refills | Status: DC | PRN

## 2022-05-16 MED ORDER — ROSUVASTATIN CALCIUM 5 MG PO TABS: 5.0000 mg | ORAL_TABLET | Freq: Every day | ORAL | 3 refills | Status: DC

## 2022-05-16 NOTE — Patient Instructions (Signed)

## 2022-05-16 NOTE — Progress Notes (Addendum)
Subjective:    Patient ID: Cynthia Mays, female    DOB: 01/20/1941, 81 y.o.   MRN: [id number]  Chief Complaint  Patient presents with   Medical Management of Chronic Issues   PT presents to the office today for chronic follow up. She is morbid obese with a BMI of 37 and DM and HTN.   She has CAD and does not take statin at this time.  Hypertension This is a chronic problem. The current episode started more than 1 year ago. The problem has been resolved since onset. The problem is controlled. Associated symptoms include anxiety. Pertinent negatives include no blurred vision, malaise/fatigue, peripheral edema or shortness of breath. Risk factors for coronary artery disease include dyslipidemia, obesity and sedentary lifestyle. The current treatment provides moderate improvement. Identifiable causes of hypertension include a thyroid problem.  Gastroesophageal Reflux She complains of belching and heartburn. She reports no hoarse voice. This is a chronic problem. The current episode started more than 1 year ago. The problem occurs occasionally. The symptoms are aggravated by certain foods. Associated symptoms include fatigue. Risk factors include obesity. She has tried a PPI for the symptoms. The treatment provided moderate relief.  Thyroid Problem Presents for follow-up visit. Symptoms include fatigue. Patient reports no anxiety, constipation, depressed mood, dry skin or hoarse voice. The symptoms have been stable. Her past medical history is significant for hyperlipidemia.  Hyperlipidemia This is a chronic problem. The current episode started more than 1 year ago. The problem is controlled. Exacerbating diseases include obesity. Pertinent negatives include no shortness of breath. Current antihyperlipidemic treatment includes diet change. The current treatment provides mild improvement of lipids. Risk factors for coronary artery disease include dyslipidemia, hypertension, a sedentary lifestyle,  post-menopausal and diabetes mellitus.  Diabetes She presents for her follow-up diabetic visit. She has type 2 diabetes mellitus. Pertinent negatives for hypoglycemia include no nervousness/anxiousness. Associated symptoms include fatigue. Pertinent negatives for diabetes include no blurred vision. Symptoms are stable. Pertinent negatives for diabetic complications include no nephropathy or peripheral neuropathy. Risk factors for coronary artery disease include dyslipidemia, diabetes mellitus, hypertension, sedentary lifestyle and post-menopausal. (Does not check at home ) Eye exam is not current.  Back Pain This is a chronic problem. The current episode started more than 1 year ago. The problem occurs intermittently. The problem is unchanged. The pain is present in the lumbar spine. The quality of the pain is described as aching. The pain is at a severity of 5/10. The pain is mild. She has tried bed rest for the symptoms. The treatment provided mild relief.  Anxiety Presents for follow-up visit. Symptoms include excessive worry, irritability and restlessness. Patient reports no depressed mood, nervous/anxious behavior or shortness of breath. Symptoms occur occasionally. The severity of symptoms is mild.      Current opioids rx- Norco 5-325 mg # 28 for three months, Valium 5 mg #30 Effectiveness of current meds-stable Adverse reactions from pain meds- n/a Morphine equivalent- 20  Pill count performed-No Last drug screen - 11/11/21 ( high risk q61m moderate risk q667mlow risk yearly ) U[place] drug [place]- [place]  If yes were their any concerning findings? - gets Valium and Norco. Only takes as needed and not daily.   Pain contract signed on:11/12/21    Review of Systems  Constitutional:  Positive for fatigue and irritability. Negative for malaise/fatigue.  HENT:  Negative for hoarse voice.   Eyes:  Negative for blurred vision.  Respiratory:  Negative  for  shortness of breath.   Gastrointestinal:  Positive for heartburn. Negative for constipation.  Musculoskeletal:  Positive for back pain.  Psychiatric/Behavioral:  The patient is not nervous/anxious.   All other systems reviewed and are negative.      Objective:   Physical Exam Vitals reviewed.  Constitutional:      General: She is not in acute distress.    Appearance: She is well-developed. She is obese.  HENT:     Head: Normocephalic and atraumatic.     Right Ear: Tympanic membrane normal.     Left Ear: Tympanic membrane normal.  Eyes:     Pupils: Pupils are equal, round, and reactive to light.  Neck:     Thyroid: No thyromegaly.  Cardiovascular:     Rate and Rhythm: Normal rate and regular rhythm.     Heart sounds: Normal heart sounds. No murmur heard. Pulmonary:     Effort: Pulmonary effort is normal. No respiratory distress.     Breath sounds: Normal breath sounds. No wheezing.  Abdominal:     General: Bowel sounds are normal. There is no distension.     Palpations: Abdomen is soft.     Tenderness: There is no abdominal tenderness.  Musculoskeletal:        General: No tenderness. Normal range of motion.     Cervical back: Normal range of motion and neck supple.  Skin:    General: Skin is warm and dry.  Neurological:     Mental Status: She is alert and oriented to person, place, and time.     Cranial Nerves: No cranial nerve deficit.     Deep Tendon Reflexes: Reflexes are normal and symmetric.  Psychiatric:        Behavior: Behavior normal.        Thought Content: Thought content normal.        Judgment: Judgment normal.       BP 119/74   Pulse 68   Temp (!) 97.3 F (36.3 C) (Temporal)   Ht 4' 11" (1.499 m)   Wt 187 lb 12.8 oz (85.2 kg)   SpO2 96%   BMI 37.93 kg/m      Assessment & Plan:  Cynthia Mays comes in today with chief complaint of Medical Management of Chronic Issues   Diagnosis and orders addressed:  1. Need for immunization  against influenza - Flu Vaccine QUAD High Dose(Fluad) - CMP14+EGFR - CBC with Differential/Platelet  2. Essential hypertension - CMP14+EGFR - CBC with Differential/Platelet  3. Coronary artery disease due to lipid rich plaque - CMP14+EGFR - CBC with Differential/Platelet  4. Gastroesophageal reflux disease without esophagitis - CMP14+EGFR - CBC with Differential/Platelet  5. Hyperlipidemia associated with type 2 diabetes mellitus (HCC) - CMP14+EGFR - CBC with Differential/Platelet - rosuvastatin (CRESTOR) 5 MG tablet; Take 1 tablet (5 mg total) by mouth daily.  Dispense: 90 tablet; Refill: 3 - Lipid panel  6. Acquired hypothyroidism  - CMP14+EGFR - CBC with Differential/Platelet  7. Type 2 diabetes mellitus with other specified complication, without long-term current use of insulin (HCC) - CMP14+EGFR - CBC with Differential/Platelet - Bayer DCA Hb A1c Waived - Microalbumin / creatinine urine ratio  8. Chronic left-sided low back pain with sciatica, sciatica laterality unspecified  - HYDROcodone-acetaminophen (NORCO/VICODIN) 5-325 MG tablet; Take 1 tablet by mouth every 6 (six) hours as needed for moderate pain.  Dispense: 28 tablet; Refill: 0 - CMP14+EGFR - CBC with Differential/Platelet  9. Insomnia, unspecified type - CMP14+EGFR - CBC with  Differential/Platelet  10. Morbid obesity ([place])  - CMP14+EGFR - CBC with Differential/Platelet  11. Anxiety, generalized - diazepam (VALIUM) 5 MG tablet; Take 1 tablet (5 mg total) by mouth every 6 (six) hours as needed for anxiety or muscle spasms.  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  Dispense: 30 tablet; Refill: 2 - CMP14+EGFR - CBC with Differential/Platelet  13. Lower abdominal pain  -  HYDROcodone-acetaminophen (NORCO/VICODIN) 5-325 MG tablet; Take 1 tablet by mouth every 6 (six) hours as needed for moderate pain.  Dispense: 28 tablet; Refill: 0 - CMP14+EGFR - CBC with Differential/Platelet   Labs pending Health Maintenance reviewed Diet and exercise encouraged  Follow up plan: 3 months    Evelina Dun, FNP

## 2022-05-17 LAB — CBC WITH DIFFERENTIAL/PLATELET
Basophils Absolute: 0.1 10*3/uL (ref 0.0–0.2)
Basos: 1 %
EOS (ABSOLUTE): 0.2 10*3/uL (ref 0.0–0.4)
Eos: 4 %
Hematocrit: 43.3 % (ref 34.0–46.6)
Hemoglobin: 13.9 g/dL (ref 11.1–15.9)
Immature Grans (Abs): 0 10*3/uL (ref 0.0–0.1)
Immature Granulocytes: 0 %
Lymphocytes Absolute: 1.4 10*3/uL (ref 0.7–3.1)
Lymphs: 28 %
MCH: 27.5 pg (ref 26.6–33.0)
MCHC: 32.1 g/dL (ref 31.5–35.7)
MCV: 86 fL (ref 79–97)
Monocytes Absolute: 0.4 10*3/uL (ref 0.1–0.9)
Monocytes: 8 %
Neutrophils Absolute: 3 10*3/uL (ref 1.4–7.0)
Neutrophils: 59 %
Platelets: 198 10*3/uL (ref 150–450)
RBC: 5.05 x10E6/uL (ref 3.77–5.28)
RDW: 15.1 % (ref 11.7–15.4)
WBC: 5.1 10*3/uL (ref 3.4–10.8)

## 2022-05-17 LAB — CMP14+EGFR
ALT: 12 IU/L (ref 0–32)
AST: 20 IU/L (ref 0–40)
Albumin/Globulin Ratio: 1.3 (ref 1.2–2.2)
Albumin: 4 g/dL (ref 3.8–4.8)
Alkaline Phosphatase: 109 IU/L (ref 44–121)
BUN/Creatinine Ratio: 18 (ref 12–28)
BUN: 16 mg/dL (ref 8–27)
Bilirubin Total: 0.3 mg/dL (ref 0.0–1.2)
CO2: 23 mmol/L (ref 20–29)
Calcium: 9.3 mg/dL (ref 8.7–10.3)
Chloride: 108 mmol/L — ABNORMAL HIGH (ref 96–106)
Creatinine, Ser: 0.91 mg/dL (ref 0.57–1.00)
Globulin, Total: 3.2 g/dL (ref 1.5–4.5)
Glucose: 115 mg/dL — ABNORMAL HIGH (ref 70–99)
Potassium: 4.5 mmol/L (ref 3.5–5.2)
Sodium: 143 mmol/L (ref 134–144)
Total Protein: 7.2 g/dL (ref 6.0–8.5)
eGFR: 64 mL/min/{1.73_m2} (ref >59)

## 2022-05-17 LAB — LIPID PANEL
Chol/HDL Ratio: 3.4 ratio (ref 0.0–4.4)
Cholesterol, Total: 198 mg/dL (ref 100–199)
HDL: 58 mg/dL (ref >39)
LDL Chol Calc (NIH): 113 mg/dL — ABNORMAL HIGH (ref 0–99)
Triglycerides: 152 mg/dL — ABNORMAL HIGH (ref 0–149)
VLDL Cholesterol Cal: 27 mg/dL (ref 5–40)

## 2022-05-18 LAB — MICROALBUMIN / CREATININE URINE RATIO
Creatinine, Urine: 97.2 mg/dL
Microalb/Creat Ratio: 11 mg/g creat (ref 0–29)
Microalbumin, Urine: 10.4 ug/mL

## 2022-05-22 ENCOUNTER — Telehealth: Payer: Self-pay | Admitting: Family

## 2022-05-22 NOTE — Telephone Encounter (Signed)
  Prescription Request  05/22/2022  Is this a "Controlled Substance" medicine? NIFEdipine (PROCARDIA-XL/NIFEDICAL-XL) 30 MG 24 hr tablet  Have you seen your PCP in the last 2 weeks? Yes pt was seen 05/16/2022. Upcoming apt 08/15/2022  If YES, route message to pool  -  If NO, patient needs to be scheduled for appointment.  What is the name of the medication or equipment? NIFEdipine (PROCARDIA-XL/NIFEDICAL-XL) 30 MG 24 hr tablet  Have you contacted your pharmacy to request a refill? Yes but was refused   Which pharmacy would you like this sent to? [facility] has 5 days left   Patient notified that their request is being sent to the clinical staff for review and that they should receive a response within 2 business days.

## 2022-05-26 ENCOUNTER — Other Ambulatory Visit: Payer: Self-pay | Admitting: Family Medicine

## 2022-05-26 DIAGNOSIS — I10 Essential (primary) hypertension: Secondary | ICD-10-CM

## 2022-05-26 MED ORDER — NIFEDIPINE ER OSMOTIC RELEASE 30 MG PO TB24: 30.0000 mg | ORAL_TABLET | Freq: Every day | ORAL | 3 refills | Status: DC

## 2022-05-29 ENCOUNTER — Ambulatory Visit: Payer: Medicare Other | Admitting: Orthopaedic Surgery

## 2022-06-18 DIAGNOSIS — M25562 Pain in left knee: Secondary | ICD-10-CM | POA: Diagnosis not present

## 2022-06-18 DIAGNOSIS — M25561 Pain in right knee: Secondary | ICD-10-CM | POA: Diagnosis not present

## 2022-06-18 DIAGNOSIS — M170 Bilateral primary osteoarthritis of knee: Secondary | ICD-10-CM | POA: Diagnosis not present

## 2022-06-26 ENCOUNTER — Telehealth: Payer: Self-pay | Admitting: Orthopedic Surgery

## 2022-06-26 NOTE — Telephone Encounter (Signed)
Patient had left message with no name or information then called back. Spoke briefly with [place] about her back hurting "from someone giving her a bear hug."  We relayed next available with Dr Aline Brochure. Patient was deciding. I called back to follow up, reached voice mail, left message to return call.

## 2022-06-27 ENCOUNTER — Ambulatory Visit
Admission: EM | Admit: 2022-06-27 | Discharge: 2022-06-27 | Disposition: A | Discharge status: Home / Self Care | Payer: Medicare Other | Attending: Nurse Practitioner | Admitting: Nurse Practitioner

## 2022-06-27 ENCOUNTER — Telehealth: Payer: Self-pay

## 2022-06-27 ENCOUNTER — Ambulatory Visit (INDEPENDENT_AMBULATORY_CARE_PROVIDER_SITE_OTHER): Payer: Medicare Other

## 2022-06-27 DIAGNOSIS — M5441 Lumbago with sciatica, right side: Secondary | ICD-10-CM | POA: Diagnosis not present

## 2022-06-27 DIAGNOSIS — M545 Low back pain, unspecified: Secondary | ICD-10-CM | POA: Diagnosis not present

## 2022-06-27 DIAGNOSIS — M47816 Spondylosis without myelopathy or radiculopathy, lumbar region: Secondary | ICD-10-CM | POA: Diagnosis not present

## 2022-06-27 MED ORDER — DEXAMETHASONE SODIUM PHOSPHATE 10 MG/ML IJ SOLN
10.0000 mg | Freq: Once | INTRAMUSCULAR | Status: AC
  Administered 2022-06-27: 10 mg via INTRAMUSCULAR

## 2022-06-27 NOTE — Discharge Instructions (Addendum)
X-ray today does not show any new fractures, lumbar fusion looks stable.  We have treated the pain/shooting pain down your leg with a steroid injection that we will stay in your system for the next few days.  I will elevate your blood sugars temporarily.  Follow-up with orthopedic provider with no improvement in symptoms despite treatment.

## 2022-06-27 NOTE — Telephone Encounter (Signed)
Patient called stating that her neighbor's son gave her a "bear hug" and she has been in pain for three days.  I advised her of Dr. Ruthe Mannan schedule and she stated she may go ahead to the ER. I advised her that she may not have  to wait as long if she wants to try going to Urgent Care, but it is her choice. She was very Patent attorney.

## 2022-06-27 NOTE — ED Provider Notes (Signed)
[facility]    CSN: [id number] Arrival date & time: 06/27/22  1017      History   Chief Complaint No chief complaint on file.   HPI Cynthia Mays is a 81 y.o. female.   Patient presents with acute low back pain for the past 6 days.  Reports her neighbor's son recently gave her a very tight hug and she has felt the pain ever since.  No recent fall, accident, injury, or known trauma to the back.  Reports the pain is moderate to severe and sharp and shooting down her right leg.  She denies numbness or tingling in her toes, weakness, decreased sensation in her leg, saddle anesthesia, new bowel or bladder incontinence, fever, nausea/vomiting, changes in urination since the pain started.  Has taken Percocet, muscle relaxants without improvement.  Patient has a history significant for back surgery with rods in her back per her report.    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   Colonic ulcer 08/13/2020   Diarrhea 08/03/2019   Morbid obesity (HCC) 03/31/2019   Chronic use of benzodiazepine for therapeutic purpose 01/26/2018   Hiatal hernia 10/29/2017   Hyperlipidemia associated with type 2 diabetes mellitus (HCC) 12/31/2015   Coronary artery disease due to lipid rich plaque 12/31/2015   Left knee pain 10/24/2015   Macular degeneration, 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   Essential hypertension 02/20/2015   Chronic low back pain with sciatica 02/20/2015   Gastroesophageal reflux disease without esophagitis 11/20/2014   Anxiety, generalized 11/20/2014    Hypothyroidism 12/27/2013   DM (diabetes mellitus) (HCC) 12/27/2013   Precordial pain 01/15/2013    Past Surgical History:  Procedure Laterality Date   APPENDECTOMY  2002   arthroscopy  right 2002, left 2007   bilateral knees   BACK SURGERY  10/2007, 09/2008   x2   BIOPSY  10/02/2016   Procedure: BIOPSY;  Surgeon: Malissa Hippo, MD;  Location: [facility];  Service: Endoscopy;;  gastric   BIOPSY  05/04/2020   Procedure: BIOPSY;  Surgeon: Dolores Frame, MD;  Location: [facility];  Service: Gastroenterology;;   CARPAL TUNNEL RELEASE  1986   bilateral   CESAREAN SECTION  1969, 1967   x2   COLONOSCOPY WITH PROPOFOL N/A 05/04/2020   Castaneda:The examined portion of the ileum was normal(focally active, nonspecific ileitis, neg for features of chronicity or granulomas)- A single (solitary) ulcer in the cecum.(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 tortuous colon, non bleeding internal hemorrhoids   ESOPHAGOGASTRODUODENOSCOPY N/A 10/02/2016   Procedure: ESOPHAGOGASTRODUODENOSCOPY (EGD);  Surgeon: Malissa Hippo, MD;  Location: [facility];  Service: Endoscopy;  Laterality: N/A;  11:15 - moved to 2/1 @ 3:00   ESOPHAGOGASTRODUODENOSCOPY (EGD) WITH PROPOFOL N/A 05/04/2020   Castaneda: normal esophagus, 4cm hh with four cameron ulcers, multiple gastric polyps-fundic gland polyps, erythematous mucosa in pylorus, gastric antral mucosa and oxyntic mucosa with mild chronic gastritis and reactive  changes, neg h pylori, normal examined duodenum   KNEE SURGERY     both    OB History   No obstetric history on file.      Home Medications    Prior to Admission medications   Medication Sig Start Date End Date Taking? Authorizing Provider  cholecalciferol (VITAMIN D3) 25 MCG (1000 UNIT) tablet Take 1,000 Units by mouth daily.     [provider]  clobetasol  cream (TEMOVATE) 0.05 % APPLY 1 APPLICATION TOPICALLY ONCE DAILY AS NEEDED FOR  RASH 10/01/21   Lazaro Arms, MD  dapagliflozin propanediol (FARXIGA) 5 MG TABS tablet Take 1 tablet (5 mg total) by mouth daily before breakfast. 02/11/22   Junie Spencer, FNP  diazepam (VALIUM) 5 MG tablet Take 1 tablet (5 mg total) by mouth every 6 (six) hours as needed for anxiety or muscle spasms. 05/16/22   Junie Spencer, FNP  fluticasone (FLONASE) 50 MCG/ACT nasal spray Place 2 sprays into the nose daily.    [provider]  HYDROcodone-acetaminophen (NORCO/VICODIN) 5-325 MG tablet Take 1 tablet by mouth every 6 (six) hours as needed for moderate pain. 05/16/22   Junie Spencer, FNP  levocetirizine (XYZAL) 5 MG tablet Take 0.5 tablets (2.5 mg total) by mouth every evening. 12/30/21   Gabriel Earing, FNP  levothyroxine (SYNTHROID) 112 MCG tablet Take 1 tablet (112 mcg total) by mouth daily before breakfast. 02/11/22   Junie Spencer, FNP  meclizine (ANTIVERT) 12.5 MG tablet Take 1 tablet (12.5 mg total) by mouth 3 (three) times daily as needed for dizziness. 12/30/21   Gabriel Earing, FNP  mesalamine (LIALDA) 1.2 g EC tablet Take 2 tablets (2.4 g total) by mouth daily with breakfast. 08/01/21   Carlan, Jeral Pinch, NP  Multiple Vitamins-Minerals (MULTIVITAMINS THER. W/MINERALS) TABS Take 1 tablet by mouth daily.    [provider]  NIFEdipine (PROCARDIA-XL/NIFEDICAL-XL) 30 MG 24 hr tablet Take 1 tablet (30 mg total) by mouth daily. 05/26/22   [name] A, FNP  ondansetron (ZOFRAN ODT) 8 MG disintegrating tablet Take 1 tablet (8 mg total) by mouth every 8 (eight) hours as needed for nausea or vomiting. 09/19/21   [name] A, FNP  pantoprazole (PROTONIX) 40 MG tablet Take 1 tablet (40 mg total) by mouth 2 (two) times daily. 08/01/21   Raquel James, NP  Probiotic Product (PHILLIPS COLON HEALTH) CAPS Take 1 capsule by mouth daily.     [provider]  rosuvastatin (CRESTOR) 5  MG tablet Take 1 tablet (5 mg total) by mouth daily. 05/16/22   [name] A, FNP  sertraline (ZOLOFT) 50 MG tablet TAKE 1 & 1/2 (ONE & ONE-HALF) TABLETS BY MOUTH ONCE DAILY 02/11/22   [name] A, FNP  traZODone (DESYREL) 50 MG tablet Take 1-2 tablets (50-100 mg total) by mouth at bedtime. 02/11/22   Junie Spencer, FNP    Family History Family History  Problem Relation Age of Onset   Anesthesia problems Mother    CAD Mother 22   Cancer Mother        Pancreatic cancer   Hypertension Father    Suicidality Father        gunshot   Deafness Sister    Heart disease Brother    Suicidality Son    Cancer Sister        female    Suicidality Brother    Hypotension Neg Hx    Pseudochol deficiency Neg Hx  Malignant hyperthermia Neg Hx     Social History Social History   Tobacco Use   Smoking status: Former    Packs/day: 1.00    Years: 43.00    Total pack years: 43.00    Types: Cigarettes    Quit date: 05/02/1970    Years since quitting: 52.1   Smokeless tobacco: Never   Tobacco comments:    > 20 years quit  Vaping Use   Vaping Use: Never used  Substance Use Topics   Alcohol use: No    Comment: occasionally wine   Drug use: No     Allergies   Codeine, Penicillins, and Pravastatin   Review of Systems Review of Systems Per HPI  Physical Exam Triage Vital Signs ED Triage Vitals  Enc Vitals Group     BP 06/27/22 1141 (!) 144/85     Pulse Rate 06/27/22 1141 64     Resp 06/27/22 1141 20     Temp 06/27/22 1141 97.8 F (36.6 C)     Temp Source 06/27/22 1141 Oral     SpO2 06/27/22 1141 93 %     Weight --      Height --      Head Circumference --      Peak Flow --      Pain Score 06/27/22 1144 8     Pain Loc --      Pain Edu? --      Excl. in GC? --    No data found.  Updated Vital Signs BP (!) 144/85 (BP Location: Right Arm)   Pulse 64   Temp 97.8 F (36.6 C) (Oral)   Resp 20   SpO2 93%   Visual Acuity Right Eye Distance:   Left Eye  Distance:   Bilateral Distance:    Right Eye Near:   Left Eye Near:    Bilateral Near:     Physical Exam Vitals and nursing note reviewed.  Constitutional:      General: She is not in acute distress.    Appearance: Normal appearance. She is not toxic-appearing.  HENT:     Mouth/Throat:     Mouth: Mucous membranes are moist.     Pharynx: Oropharynx is clear.  Pulmonary:     Effort: Pulmonary effort is normal. No respiratory distress.  Musculoskeletal:     Lumbar back: Tenderness and bony tenderness present. No swelling, edema or deformity. Normal range of motion.       Back:     Comments: Surgical incision noted to lumbar spine.  No obvious deformity, redness, swelling, or bruising to lumbar spine.  Tenderness to palpation of lumbar spine in approximately area marked.  Full range of motion to spine, bilateral lower extremities.  Strength equal bilateral lower extremities.  Patient is neurovascularly intact right and left lower extremity.  Skin:    General: Skin is warm and dry.     Capillary Refill: Capillary refill takes less than 2 seconds.     Coloration: Skin is not jaundiced or pale.     Findings: No erythema.  Neurological:     Mental Status: She is alert and oriented to person, place, and time.  Psychiatric:        Behavior: Behavior is cooperative.      UC Treatments / Results  Labs (all labs ordered are listed, but only abnormal results are displayed) Labs Reviewed - No data to display  EKG   Radiology DG Lumbar Spine Complete  Result Date: 06/27/2022 CLINICAL  DATA:  Back pain EXAM: LUMBAR SPINE - COMPLETE 4+ VIEW COMPARISON:  03/29/2021 FINDINGS: No recent fracture is seen. There is surgical fusion from L2-L4 levels. L5 vertebra is transitional. There is mild dextroscoliosis. Degenerative changes are noted with disc space narrowing, bony spurs and facet hypertrophy, more so at L1-L2 and L4-L5 levels. Extensive arterial calcifications are seen. IMPRESSION:  Lumbar spondylosis. No recent fracture is seen. There is surgical fusion from L2-L4 levels. Electronically Signed   By: Elmer Picker M.D.   On: 06/27/2022 12:23    Procedures Procedures (including critical care time)  Medications Ordered in UC Medications  dexamethasone (DECADRON) injection 10 mg (10 mg Intramuscular Given 06/27/22 1238)    Initial Impression / Assessment and Plan / UC Course  I have reviewed the triage vital signs and the nursing notes.  Pertinent labs & imaging results that were available during my care of the patient were reviewed by me and considered in my medical decision making (see chart for details).   Patient is well-appearing, normotensive, afebrile, not tachycardic, not tachypneic, oxygenating well on room air.    Acute right-sided low back pain with right-sided sciatica Back x-ray today does not show any acute fracture; surgical fusion at the L2-L4 Discussed results of back x-ray with patient Patient unable to take NSAIDs secondary to hiatal hernia, she is already taken narcotics and muscle relaxant without relief Given possible inflammation of the nerve with sciatica of right side, will treat with Decadron 10 mg IM today in urgent care ER and return precautions discussed Follow-up with primary care provider if no improvement in symptoms  The patient was given the opportunity to ask questions.  All questions answered to their satisfaction.  The patient is in agreement to this plan.    Final Clinical Impressions(s) / UC Diagnoses   Final diagnoses:  Acute right-sided low back pain with right-sided sciatica     Discharge Instructions      X-ray today does not show any new fractures, lumbar fusion looks stable.  We have treated the pain/shooting pain down your leg with a steroid injection that we will stay in your system for the next few days.  I will elevate your blood sugars temporarily.  Follow-up with orthopedic provider with no improvement  in symptoms despite treatment.     ED Prescriptions   None    I have reviewed the PDMP during this encounter.   Eulogio Bear, NP 06/27/22 1337

## 2022-06-27 NOTE — ED Triage Notes (Signed)
Pt reports low  back pain for about 6 days she took tylenol and percocet and musclke relaxer provides slight relief at night time. Hurst worst when walking     Has rod in pack

## 2022-07-01 DIAGNOSIS — I10 Essential (primary) hypertension: Secondary | ICD-10-CM | POA: Diagnosis not present

## 2022-07-01 DIAGNOSIS — E119 Type 2 diabetes mellitus without complications: Secondary | ICD-10-CM | POA: Diagnosis not present

## 2022-07-16 DIAGNOSIS — M170 Bilateral primary osteoarthritis of knee: Secondary | ICD-10-CM | POA: Diagnosis not present

## 2022-07-30 DIAGNOSIS — M170 Bilateral primary osteoarthritis of knee: Secondary | ICD-10-CM | POA: Diagnosis not present

## 2022-08-05 ENCOUNTER — Other Ambulatory Visit (INDEPENDENT_AMBULATORY_CARE_PROVIDER_SITE_OTHER): Payer: Self-pay | Admitting: Gastroenterology

## 2022-08-05 DIAGNOSIS — K633 Ulcer of intestine: Secondary | ICD-10-CM

## 2022-08-05 DIAGNOSIS — K529 Noninfective gastroenteritis and colitis, unspecified: Secondary | ICD-10-CM

## 2022-08-05 NOTE — Telephone Encounter (Signed)
Last seen 08/01/2021.

## 2022-08-06 DIAGNOSIS — M170 Bilateral primary osteoarthritis of knee: Secondary | ICD-10-CM | POA: Diagnosis not present

## 2022-08-08 DIAGNOSIS — R0981 Nasal congestion: Secondary | ICD-10-CM | POA: Diagnosis not present

## 2022-08-08 DIAGNOSIS — R059 Cough, unspecified: Secondary | ICD-10-CM | POA: Diagnosis not present

## 2022-08-08 DIAGNOSIS — J0100 Acute maxillary sinusitis, unspecified: Secondary | ICD-10-CM | POA: Diagnosis not present

## 2022-08-08 DIAGNOSIS — R509 Fever, unspecified: Secondary | ICD-10-CM | POA: Diagnosis not present

## 2022-08-13 DIAGNOSIS — M170 Bilateral primary osteoarthritis of knee: Secondary | ICD-10-CM | POA: Diagnosis not present

## 2022-08-13 DIAGNOSIS — M25562 Pain in left knee: Secondary | ICD-10-CM | POA: Diagnosis not present

## 2022-08-13 DIAGNOSIS — M25561 Pain in right knee: Secondary | ICD-10-CM | POA: Diagnosis not present

## 2022-08-15 ENCOUNTER — Encounter: Payer: Self-pay | Admitting: Family

## 2022-08-15 ENCOUNTER — Ambulatory Visit (INDEPENDENT_AMBULATORY_CARE_PROVIDER_SITE_OTHER): Payer: Medicare Other | Admitting: Family

## 2022-08-15 VITALS — BP 131/77 | HR 63 | Temp 98.0°F | Ht 59.0 in | Wt 185.0 lb

## 2022-08-15 DIAGNOSIS — F411 Generalized anxiety disorder: Secondary | ICD-10-CM

## 2022-08-15 DIAGNOSIS — I2583 Coronary atherosclerosis due to lipid rich plaque: Secondary | ICD-10-CM

## 2022-08-15 DIAGNOSIS — M5440 Lumbago with sciatica, unspecified side: Secondary | ICD-10-CM | POA: Diagnosis not present

## 2022-08-15 DIAGNOSIS — I2510 Atherosclerotic heart disease of native coronary artery without angina pectoris: Secondary | ICD-10-CM

## 2022-08-15 DIAGNOSIS — E785 Hyperlipidemia, unspecified: Secondary | ICD-10-CM

## 2022-08-15 DIAGNOSIS — R1030 Lower abdominal pain, unspecified: Secondary | ICD-10-CM

## 2022-08-15 DIAGNOSIS — G8929 Other chronic pain: Secondary | ICD-10-CM

## 2022-08-15 DIAGNOSIS — E039 Hypothyroidism, unspecified: Secondary | ICD-10-CM | POA: Diagnosis not present

## 2022-08-15 DIAGNOSIS — K219 Gastro-esophageal reflux disease without esophagitis: Secondary | ICD-10-CM

## 2022-08-15 DIAGNOSIS — E1169 Type 2 diabetes mellitus with other specified complication: Secondary | ICD-10-CM

## 2022-08-15 DIAGNOSIS — Z981 Arthrodesis status: Secondary | ICD-10-CM | POA: Diagnosis not present

## 2022-08-15 DIAGNOSIS — I10 Essential (primary) hypertension: Secondary | ICD-10-CM | POA: Diagnosis not present

## 2022-08-15 DIAGNOSIS — Z79899 Other long term (current) drug therapy: Secondary | ICD-10-CM

## 2022-08-15 DIAGNOSIS — G4700 Insomnia, unspecified: Secondary | ICD-10-CM | POA: Diagnosis not present

## 2022-08-15 LAB — BAYER DCA HB A1C WAIVED: HB A1C (BAYER DCA - WAIVED): 7.4 % — ABNORMAL HIGH (ref 4.8–5.6)

## 2022-08-15 MED ORDER — DIAZEPAM 5 MG PO TABS: 5.0000 mg | ORAL_TABLET | Freq: Four times a day (QID) | ORAL | 2 refills | Status: DC | PRN

## 2022-08-15 MED ORDER — HYDROCODONE-ACETAMINOPHEN 5-325 MG PO TABS: 1.0000 | ORAL_TABLET | Freq: Four times a day (QID) | ORAL | 0 refills | Status: DC | PRN

## 2022-08-15 MED ORDER — DICLOFENAC SODIUM 75 MG PO TBEC: 75.0000 mg | DELAYED_RELEASE_TABLET | Freq: Two times a day (BID) | ORAL | 2 refills | Status: DC

## 2022-08-15 NOTE — Progress Notes (Signed)
Subjective:    Patient ID: Cynthia Mays, female    DOB: September 12, 1940, 81 y.o.   MRN: [id number]  Chief Complaint  Patient presents with   Medical Management of Chronic Issues   PT presents to the office today for chronic follow up. She is morbid obese with a BMI of 37 and DM and HTN.    She has CAD and taking Crestor daily.  Hypertension This is a chronic problem. The current episode started more than 1 year ago. The problem has been resolved since onset. The problem is controlled. Associated symptoms include anxiety and malaise/fatigue. Pertinent negatives include no blurred vision, peripheral edema or shortness of breath. Risk factors for coronary artery disease include dyslipidemia, obesity and sedentary lifestyle. The current treatment provides moderate improvement. There is no history of heart failure. Identifiable causes of hypertension include a thyroid problem.  Gastroesophageal Reflux She complains of belching, heartburn and nausea. She reports no hoarse voice. This is a chronic problem. The current episode started more than 1 year ago. The problem occurs occasionally. The symptoms are aggravated by certain foods. Associated symptoms include fatigue. Risk factors include obesity. She has tried a PPI for the symptoms. The treatment provided moderate relief.  Thyroid Problem Presents for follow-up visit. Symptoms include anxiety, depressed mood and fatigue. Patient reports no constipation, diarrhea, dry skin or hoarse voice. The symptoms have been stable. Her past medical history is significant for hyperlipidemia. There is no history of heart failure.  Hyperlipidemia This is a chronic problem. The current episode started more than 1 year ago. The problem is controlled. Exacerbating diseases include obesity. Associated symptoms include leg pain. Pertinent negatives include no shortness of breath. Current antihyperlipidemic treatment includes statins. The current treatment provides  moderate improvement of lipids.  Diabetes She presents for her follow-up diabetic visit. She has type 2 diabetes mellitus. Hypoglycemia symptoms include nervousness/anxiousness. Associated symptoms include fatigue. Pertinent negatives for diabetes include no blurred vision and no foot paresthesias. Symptoms are stable. Risk factors for coronary artery disease include dyslipidemia, diabetes mellitus, hypertension, sedentary lifestyle and post-menopausal. She is following a generally healthy diet. (Does not check at home) Eye exam is not current.  Back Pain This is a chronic problem. The current episode started more than 1 year ago. The problem occurs intermittently. The problem has been waxing and waning since onset. The pain is present in the gluteal. The quality of the pain is described as aching. The pain radiates to the right thigh. The pain is at a severity of 10/10 (when walking). The pain is moderate. Associated symptoms include leg pain and tingling. Risk factors include obesity. The treatment provided mild relief.  Insomnia Primary symptoms: difficulty falling asleep, malaise/fatigue.   The current episode started more than one year. The onset quality is gradual. Past treatments include medication. The treatment provided moderate relief.  Anxiety Presents for follow-up visit. Symptoms include depressed mood, excessive worry, insomnia, irritability, nausea and nervous/anxious behavior. Patient reports no shortness of breath. Symptoms occur occasionally. The severity of symptoms is moderate.        Review of Systems  Constitutional:  Positive for fatigue, irritability and malaise/fatigue.  HENT:  Negative for hoarse voice.   Eyes:  Negative for blurred vision.  Respiratory:  Negative for shortness of breath.   Gastrointestinal:  Positive for heartburn and nausea. Negative for constipation and diarrhea.  Musculoskeletal:  Positive for back pain.  Neurological:  Positive for tingling.   Psychiatric/Behavioral:  The patient is nervous/anxious and  has insomnia.   All other systems reviewed and are negative.      Objective:   Physical Exam Vitals reviewed.  Constitutional:      General: She is not in acute distress.    Appearance: She is well-developed. She is obese.  HENT:     Head: Normocephalic and atraumatic.     Right Ear: Tympanic membrane normal.     Left Ear: Tympanic membrane normal.  Eyes:     Pupils: Pupils are equal, round, and reactive to light.  Neck:     Thyroid: No thyromegaly.  Cardiovascular:     Rate and Rhythm: Normal rate and regular rhythm.     Heart sounds: Normal heart sounds. No murmur heard. Pulmonary:     Effort: Pulmonary effort is normal. No respiratory distress.     Breath sounds: Normal breath sounds. No wheezing.  Abdominal:     General: Bowel sounds are normal. There is no distension.     Palpations: Abdomen is soft.     Tenderness: There is no abdominal tenderness.  Musculoskeletal:        General: No tenderness.     Cervical back: Normal range of motion and neck supple.     Comments: Pain in right buttocks with walking  Skin:    General: Skin is warm and dry.  Neurological:     Mental Status: She is alert and oriented to person, place, and time.     Cranial Nerves: No cranial nerve deficit.     Motor: Weakness present.     Gait: Gait abnormal.     Deep Tendon Reflexes: Reflexes are normal and symmetric.     Comments: Using cane to walk  Psychiatric:        Behavior: Behavior normal.        Thought Content: Thought content normal.        Judgment: Judgment normal.       BP 131/77   Pulse 63   Temp 98 F (36.7 C)   Ht _0  (1.499 m)   Wt 185 lb (83.9 kg)   SpO2 94%   BMI 37.37 kg/m      Assessment & Plan:  Cynthia Mays comes in today with chief complaint of Medical Management of Chronic Issues   Diagnosis and orders addressed:  1. Acquired hypothyroidism - CMP14+EGFR  2. Hyperlipidemia  associated with type 2 diabetes mellitus ([place]) - CMP14+EGFR  3. Insomnia, unspecified type - CMP14+EGFR  4. Morbid obesity (HCC) - CMP14+EGFR  5. History of lumbar fusion - CMP14+EGFR  6. Gastroesophageal reflux disease without esophagitis - CMP14+EGFR  7. Essential hypertension - CMP14+EGFR  8. Type 2 diabetes mellitus with other specified complication, without long-term current use of insulin (HCC) - Bayer DCA Hb A1c Waived - CMP14+EGFR  9. Coronary artery disease due to lipid rich plaque - CMP14+EGFR  10. Chronic left-sided low back pain with sciatica, sciatica laterality unspecified Referral to PT Start diclofenac BID No other NSAID's - CMP14+EGFR - HYDROcodone-acetaminophen (NORCO/VICODIN) 5-325 MG tablet; Take 1 tablet by mouth every 6 (six) hours as needed for moderate pain.  Dispense: 28 tablet; Refill: 0 - Ambulatory referral to Physical Therapy  11. Anxiety, generalized - CMP14+EGFR - diazepam (VALIUM) 5 MG tablet; Take 1 tablet (5 mg total) by mouth every 6 (six) hours as needed for anxiety or muscle spasms.  Dispense: 30 tablet; Refill: 2  12. Chronic use of benzodiazepine for therapeutic purpose  - CMP14+EGFR - diazepam (VALIUM) 5 MG  tablet; Take 1 tablet (5 mg total) by mouth every 6 (six) hours as needed for anxiety or muscle spasms.  Dispense: 30 tablet; Refill: 2  13. Controlled substance agreement signed - CMP14+EGFR  14. Lower abdominal pain - CMP14+EGFR - HYDROcodone-acetaminophen (NORCO/VICODIN) 5-325 MG tablet; Take 1 tablet by mouth every 6 (six) hours as needed for moderate pain.  Dispense: 28 tablet; Refill: 0   Labs pending Patient reviewed in [place] controlled database, no flags noted. Contract and drug screen are up to date.  Health Maintenance reviewed Diet and exercise encouraged  Follow up plan: 3 months    Evelina Dun, FNP

## 2022-08-15 NOTE — Patient Instructions (Signed)

## 2022-08-16 LAB — CMP14+EGFR
ALT: 10 IU/L (ref 0–32)
AST: 20 IU/L (ref 0–40)
Albumin/Globulin Ratio: 1.4 (ref 1.2–2.2)
Albumin: 4.1 g/dL (ref 3.8–4.8)
Alkaline Phosphatase: 109 IU/L (ref 44–121)
BUN/Creatinine Ratio: 15 (ref 12–28)
BUN: 17 mg/dL (ref 8–27)
Bilirubin Total: 0.4 mg/dL (ref 0.0–1.2)
CO2: 24 mmol/L (ref 20–29)
Calcium: 9.1 mg/dL (ref 8.7–10.3)
Chloride: 103 mmol/L (ref 96–106)
Creatinine, Ser: 1.12 mg/dL — ABNORMAL HIGH (ref 0.57–1.00)
Globulin, Total: 3 g/dL (ref 1.5–4.5)
Glucose: 143 mg/dL — ABNORMAL HIGH (ref 70–99)
Potassium: 4.3 mmol/L (ref 3.5–5.2)
Sodium: 142 mmol/L (ref 134–144)
Total Protein: 7.1 g/dL (ref 6.0–8.5)
eGFR: 50 mL/min/{1.73_m2} — ABNORMAL LOW (ref >59)

## 2022-08-26 ENCOUNTER — Ambulatory Visit: Payer: Medicare Other | Attending: Family | Admitting: Physical Therapy

## 2022-08-26 ENCOUNTER — Other Ambulatory Visit: Payer: Self-pay

## 2022-08-26 ENCOUNTER — Encounter: Payer: Self-pay | Admitting: Physical Therapy

## 2022-08-26 DIAGNOSIS — G8929 Other chronic pain: Secondary | ICD-10-CM | POA: Insufficient documentation

## 2022-08-26 DIAGNOSIS — M5440 Lumbago with sciatica, unspecified side: Secondary | ICD-10-CM | POA: Diagnosis not present

## 2022-08-26 DIAGNOSIS — M5459 Other low back pain: Secondary | ICD-10-CM | POA: Insufficient documentation

## 2022-08-26 DIAGNOSIS — M62830 Muscle spasm of back: Secondary | ICD-10-CM | POA: Diagnosis not present

## 2022-08-26 NOTE — Therapy (Signed)
OUTPATIENT PHYSICAL THERAPY THORACOLUMBAR EVALUATION   Patient Name: Cynthia Mays MRN: [id number] DOB:Mar 24, 1941, 81 y.o., female Today's Date: 08/26/2022  END OF SESSION:  PT End of Session - 08/26/22 1145     Visit Number 1    Number of Visits 12    Date for PT Re-Evaluation 11/24/22    Authorization Type FOTO AT LEAST EVERY 5TH VISIT.  PROGRESS NOTE AT 10TH VISIT.  KX MODIFIER AFTER 15 VISITS.    PT Start Time 1116    Activity Tolerance Patient tolerated treatment well    Behavior During Therapy [facility] for tasks assessed/performed             Past Medical History:  Diagnosis Date   Complication of anesthesia    Depression    Diabetes mellitus    Fibromyalgia    GERD (gastroesophageal reflux disease)    Hiatal hernia    Hyperlipidemia    Hypertension    Hypothyroidism    Hypothyroidism 07/23/2016   PONV (postoperative nausea and vomiting)    Past Surgical History:  Procedure Laterality Date   APPENDECTOMY  2002   arthroscopy  right 2002, left 2007   bilateral knees   BACK SURGERY  10/2007, 09/2008   x2   BIOPSY  10/02/2016   Procedure: BIOPSY;  Surgeon: Malissa Hippo, MD;  [facility];  Service: Endoscopy;;  gastric   BIOPSY  05/04/2020   Procedure: BIOPSY;  Surgeon: Dolores Frame, MD;  [facility];  Service: Gastroenterology;;   CARPAL TUNNEL RELEASE  1986   bilateral   CESAREAN SECTION  1969, 1967   x2   COLONOSCOPY WITH PROPOFOL N/A 05/04/2020   Castaneda:The examined portion of the ileum was normal(focally active, nonspecific ileitis, neg for features of chronicity or granulomas)- A single (solitary) ulcer in the cecum.(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 tortuous colon, non bleeding internal hemorrhoids   ESOPHAGOGASTRODUODENOSCOPY N/A 10/02/2016   Procedure: ESOPHAGOGASTRODUODENOSCOPY (EGD);  Surgeon:  Malissa Hippo, MD;  [facility];  Service: Endoscopy;  Laterality: N/A;  11:15 - moved to 2/1 @ 3:00   ESOPHAGOGASTRODUODENOSCOPY (EGD) WITH PROPOFOL N/A 05/04/2020   Castaneda: normal esophagus, 4cm hh with four cameron ulcers, multiple gastric polyps-fundic gland polyps, erythematous mucosa in pylorus, gastric antral mucosa and oxyntic mucosa with mild chronic gastritis and reactive changes, neg h pylori, normal examined duodenum   KNEE SURGERY     both   Patient Active Problem List   Diagnosis Date Noted   Controlled substance agreement signed 08/15/2022   History of lumbar fusion 12/02/2021   Insomnia 05/14/2021   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   Colonic ulcer 08/13/2020   Morbid obesity (HCC) 03/31/2019   Chronic use of benzodiazepine for therapeutic purpose 01/26/2018   Hiatal hernia 10/29/2017   Hyperlipidemia associated with type 2 diabetes mellitus (HCC) 12/31/2015   Coronary artery disease due to lipid rich plaque 12/31/2015   Left knee pain 10/24/2015   Macular degeneration, 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   Essential hypertension 02/20/2015   Chronic low back pain with sciatica 02/20/2015   Gastroesophageal reflux disease without esophagitis 11/20/2014   Anxiety, generalized 11/20/2014   Hypothyroidism 12/27/2013   DM (diabetes mellitus) (HCC) 12/27/2013   Precordial pain 01/15/2013    REFERRING PROVIDER: [name]  REFERRING DIAG: Chronic right-sided low back pain with Sciatica.  Rationale for Evaluation and Treatment: Rehabilitation  THERAPY DIAG:  Other  low back pain - Plan: PT plan of care cert/re-cert  Muscle spasm of back - Plan: PT plan of care cert/re-cert  ONSET DATE: ~2 months.  SUBJECTIVE:                                                                                                                                                                                           SUBJECTIVE STATEMENT: The patient presents to the clinic with  c/o right-sided low back that that came on about two months ago.  She experiences pain and burning into her right buttock and to her calf.  She has had low back pain in the past and two lumbar surgeries but this pain came on for no apparent reason.  She states her pain is a 4/10 and increases when walking.  Sitting decreases pain.    PERTINENT HISTORY:  HTN, DM, adhesive allergy, two lumbar surgeries, Fibromyalgia.  PAIN:  Are you having pain? Yes: NPRS scale: 4/10 Pain location: Right low back with radiation into buttock and to calf. Pain description: Burning, sharp. Aggravating factors: As above. Relieving factors: As above.  PRECAUTIONS: None  WEIGHT BEARING RESTRICTIONS: No  FALLS:  Has patient fallen in last 6 months? No  LIVING ENVIRONMENT: Lives with: lives alone Lives in: House/apartment Stairs: 4 with railing. Has following equipment at home: None  OCCUPATION: Retired.  PLOF: Independent with basic ADLs  PATIENT GOALS: Not have pain into right buttock and LE.  NEXT MD VISIT:   OBJECTIVE:   DIAGNOSTIC FINDINGS:  X-RAY:  IMPRESSION: Lumbar spondylosis. No recent fracture is seen. There is surgical fusion from L2-L4 levels.  PATIENT SURVEYS:  FOTO Complete.    POSTURE: No Significant postural limitations, rounded shoulders, and flexed trunk   PALPATION: Very tender to palpation over right low back musculature and buttock region.  LUMBAR ROM:   Active lumbar flexion limited by 25% and active extension to 15 degrees.   LOWER EXTREMITY MMT:   Normal LE strength.  LUMBAR SPECIAL TESTS:  Equal leg lengths.  Pain reproduction with right SLR.    GAIT: Slow transition from sit to stand and a slow and purposeful gait pattern.  TODAY'S TREATMENT:  DATE: IFC at 80-150 Hz on 40 scan x 20 minutes to patient's right low back/gluteal  region. Patient tolerated treatment without complaint with normal modality response following removal of modality.         ASSESSMENT:  CLINICAL IMPRESSION: The patient presents to [facility]PPT with c/o right-sided low back with radiation pain and burning into her right buttock, posterior thigh and calf.  This has been ongoing for about two months with no known mechanism of injury.  The patient has a h/o low back pain and two surgeries.  She is very tender to palpation over her right low back musculature and buttock region.  She has increased pain with a right SLR test.  LE strength is normal.  Patient will benefit from skilled physical therapy intervention to address pain and deficits.  OBJECTIVE IMPAIRMENTS: Abnormal gait, decreased activity tolerance, decreased mobility, decreased ROM, increased muscle spasms, postural dysfunction, and pain.   ACTIVITY LIMITATIONS: locomotion level  PARTICIPATION LIMITATIONS: meal prep, cleaning, and laundry  PERSONAL FACTORS: 1 comorbidity: h/o low back pain and two surgeries  are also affecting patient's functional outcome.   REHAB POTENTIAL: Good  CLINICAL DECISION MAKING: Evolving/moderate complexity  EVALUATION COMPLEXITY: Low   GOALS: SHORT TERM GOALS: Target date: 09/09/21  Ind with an HEP. Goal status: INITIAL   LONG TERM GOALS: Target date: 11/24/22  Eliminate right LE symptoms Goal status: INITIAL  2.  Walk a community distance with pain not > 2-3/10. Goal status: INITIAL  3.  Perform ADL's with pain not > 2-3/10. Goal status: INITIAL  PLAN:  PT FREQUENCY: 2x/week  PT DURATION: 6 weeks  PLANNED INTERVENTIONS: Therapeutic exercises, Therapeutic activity, Patient/Family education, Self Care, Dry Needling, Electrical stimulation, Cryotherapy, Moist heat, Ultrasound, and Manual therapy.  PLAN FOR NEXT SESSION: Combo e'stim/US, STW/M, core exercise progression.   Honour Schwieger, [place], PT 08/26/2022, 12:13 PM

## 2022-08-28 ENCOUNTER — Encounter: Payer: Self-pay | Admitting: Family

## 2022-08-28 ENCOUNTER — Telehealth (INDEPENDENT_AMBULATORY_CARE_PROVIDER_SITE_OTHER): Payer: Medicare Other | Admitting: Family

## 2022-08-28 ENCOUNTER — Ambulatory Visit: Payer: Medicare Other | Admitting: *Deleted

## 2022-08-28 ENCOUNTER — Encounter: Payer: Self-pay | Admitting: *Deleted

## 2022-08-28 DIAGNOSIS — J069 Acute upper respiratory infection, unspecified: Secondary | ICD-10-CM

## 2022-08-28 DIAGNOSIS — M62830 Muscle spasm of back: Secondary | ICD-10-CM

## 2022-08-28 DIAGNOSIS — G8929 Other chronic pain: Secondary | ICD-10-CM | POA: Diagnosis not present

## 2022-08-28 DIAGNOSIS — M5459 Other low back pain: Secondary | ICD-10-CM | POA: Diagnosis not present

## 2022-08-28 DIAGNOSIS — M5440 Lumbago with sciatica, unspecified side: Secondary | ICD-10-CM | POA: Diagnosis not present

## 2022-08-28 MED ORDER — BENZONATATE 200 MG PO CAPS: 200.0000 mg | ORAL_CAPSULE | Freq: Three times a day (TID) | ORAL | 1 refills | Status: DC | PRN

## 2022-08-28 MED ORDER — PROMETHAZINE-DM 6.25-15 MG/5ML PO SYRP: 5.0000 mL | ORAL_SOLUTION | Freq: Three times a day (TID) | ORAL | 0 refills | Status: DC | PRN

## 2022-08-28 MED ORDER — CETIRIZINE HCL 10 MG PO TABS: 10.0000 mg | ORAL_TABLET | Freq: Every day | ORAL | 1 refills | Status: DC

## 2022-08-28 NOTE — Therapy (Signed)
OUTPATIENT PHYSICAL THERAPY THORACOLUMBAR EVALUATION   Patient Name: Cynthia Mays MRN: [id number] DOB:[birth year], 81 y.o., female Today's Date: 08/28/2022  END OF SESSION:  PT End of Session - 08/28/22 1117     Visit Number 2    Number of Visits 12    Date for PT Re-Evaluation 11/24/22    Authorization Type FOTO AT LEAST EVERY 5TH VISIT.  PROGRESS NOTE AT 10TH VISIT.  KX MODIFIER AFTER 15 VISITS.    PT Start Time 1115    PT Stop Time 1205    PT Time Calculation (min) 50 min             Past Medical History:  Diagnosis Date   Complication of anesthesia    Depression    Diabetes mellitus    Fibromyalgia    GERD (gastroesophageal reflux disease)    Hiatal hernia    Hyperlipidemia    Hypertension    Hypothyroidism    Hypothyroidism 07/23/2016   PONV (postoperative nausea and vomiting)    Past Surgical History:  Procedure Laterality Date   APPENDECTOMY  2002   arthroscopy  right 2002, left 2007   bilateral knees   BACK SURGERY  10/2007, 09/2008   x2   BIOPSY  10/02/2016   Procedure: BIOPSY;  Surgeon: Malissa Hippo, MD;  [facility];  Service: Endoscopy;;  gastric   BIOPSY  05/04/2020   Procedure: BIOPSY;  Surgeon: Dolores Frame, MD;  [facility];  Service: Gastroenterology;;   CARPAL TUNNEL RELEASE  1986   bilateral   CESAREAN SECTION  1969, 1967   x2   COLONOSCOPY WITH PROPOFOL N/A 05/04/2020   Castaneda:The examined portion of the ileum was normal(focally active, nonspecific ileitis, neg for features of chronicity or granulomas)- A single (solitary) ulcer in the cecum.(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 tortuous colon, non bleeding internal hemorrhoids   ESOPHAGOGASTRODUODENOSCOPY N/A 10/02/2016   Procedure: ESOPHAGOGASTRODUODENOSCOPY (EGD);  Surgeon: Malissa Hippo, MD;  [facility];  Service:  Endoscopy;  Laterality: N/A;  11:15 - moved to 2/1 @ 3:00   ESOPHAGOGASTRODUODENOSCOPY (EGD) WITH PROPOFOL N/A 05/04/2020   Castaneda: normal esophagus, 4cm hh with four cameron ulcers, multiple gastric polyps-fundic gland polyps, erythematous mucosa in pylorus, gastric antral mucosa and oxyntic mucosa with mild chronic gastritis and reactive changes, neg h pylori, normal examined duodenum   KNEE SURGERY     both   Patient Active Problem List   Diagnosis Date Noted   Controlled substance agreement signed 08/15/2022   History of lumbar fusion 12/02/2021   Insomnia 05/14/2021   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   Colonic ulcer 08/13/2020   Morbid obesity (HCC) 03/31/2019   Chronic use of benzodiazepine for therapeutic purpose 01/26/2018   Hiatal hernia 10/29/2017   Hyperlipidemia associated with type 2 diabetes mellitus (HCC) 12/31/2015   Coronary artery disease due to lipid rich plaque 12/31/2015   Left knee pain 10/24/2015   Macular degeneration, 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   Essential hypertension 02/20/2015   Chronic low back pain with sciatica 02/20/2015   Gastroesophageal reflux disease without esophagitis 11/20/2014   Anxiety, generalized 11/20/2014   Hypothyroidism 12/27/2013   DM (diabetes mellitus) (HCC) 12/27/2013   Precordial pain 01/15/2013    REFERRING PROVIDER: [name]  REFERRING DIAG: Chronic right-sided low back pain with Sciatica.  Rationale for Evaluation and Treatment: Rehabilitation  THERAPY DIAG:  Muscle spasm of back  ONSET DATE: ~2 months.  SUBJECTIVE:                                                                                                                                                                                           SUBJECTIVE STATEMENT:  Doing better 2/10 pain today LBP   PERTINENT HISTORY:  HTN, DM, adhesive allergy, two lumbar surgeries, Fibromyalgia.  PAIN:  Are you having pain? Yes: NPRS scale: 2/10 Pain location:  Right low back with radiation into buttock and to calf. Pain description: Burning, sharp. Aggravating factors: As above. Relieving factors: As above.  PRECAUTIONS: None  WEIGHT BEARING RESTRICTIONS: No  FALLS:  Has patient fallen in last 6 months? No  LIVING ENVIRONMENT: Lives with: lives alone Lives in: House/apartment Stairs: 4 with railing. Has following equipment at home: None  OCCUPATION: Retired.  PLOF: Independent with basic ADLs  PATIENT GOALS: Not have pain into right buttock and LE.  NEXT MD VISIT:   OBJECTIVE:   DIAGNOSTIC FINDINGS:  X-RAY:  IMPRESSION: Lumbar spondylosis. No recent fracture is seen. There is surgical fusion from L2-L4 levels.  PATIENT SURVEYS:  FOTO Complete.    POSTURE: No Significant postural limitations, rounded shoulders, and flexed trunk   PALPATION: Very tender to palpation over right low back musculature and buttock region.  LUMBAR ROM:   Active lumbar flexion limited by 25% and active extension to 15 degrees.   LOWER EXTREMITY MMT:   Normal LE strength.  LUMBAR SPECIAL TESTS:  Equal leg lengths.  Pain reproduction with right SLR.    GAIT: Slow transition from sit to stand and a slow and purposeful gait pattern.  TODAY'S TREATMENT:                                                                                                                                                              DATE:     08-28-22 [place] estim combo at  1.5 w/cm2 x 12 mins to RT side LB paras with Pt RT side lying  Manual: STW to RT LB paras and glute LT side lying  Discussed decreasing LBP by taking breaks before her LBP gets to high to decrease sensitivity.   IFC at 80-150 Hz on 40 scan x 20 minutes to patient's right low back/gluteal region. Patient tolerated treatment without complaint with normal modality response following removal of modality.         ASSESSMENT:  CLINICAL IMPRESSION: Pt arrived today doing a little better with  decreased LBP. Rx focused on discusssion of reducing pain triggers with ADL's and resting or taking a break before pain gets bad. Pt reports pain gets worse the longer she stands. [place] combo and STW were performed f/b IFC and was tolerated well.       OBJECTIVE IMPAIRMENTS: Abnormal gait, decreased activity tolerance, decreased mobility, decreased ROM, increased muscle spasms, postural dysfunction, and pain.   ACTIVITY LIMITATIONS: locomotion level  PARTICIPATION LIMITATIONS: meal prep, cleaning, and laundry  PERSONAL FACTORS: 1 comorbidity: h/o low back pain and two surgeries  are also affecting patient's functional outcome.   REHAB POTENTIAL: Good  CLINICAL DECISION MAKING: Evolving/moderate complexity  EVALUATION COMPLEXITY: Low   GOALS: SHORT TERM GOALS: Target date: 09/09/21  Ind with an HEP. Goal status: INITIAL   LONG TERM GOALS: Target date: 11/24/22  Eliminate right LE symptoms Goal status: INITIAL  2.  Walk a community distance with pain not > 2-3/10. Goal status: INITIAL  3.  Perform ADL's with pain not > 2-3/10. Goal status: INITIAL  PLAN:  PT FREQUENCY: 2x/week  PT DURATION: 6 weeks  PLANNED INTERVENTIONS: Therapeutic exercises, Therapeutic activity, Patient/Family education, Self Care, Dry Needling, Electrical stimulation, Cryotherapy, Moist heat, Ultrasound, and Manual therapy.  PLAN FOR NEXT SESSION: Combo e'stim/US, STW/M, core exercise progression.   Shirah Roseman,CHRIS, PTA 08/28/2022, 1:38 PM

## 2022-08-28 NOTE — Progress Notes (Signed)
Virtual Visit  Note Due to COVID-19 pandemic this visit was conducted virtually. This visit type was conducted due to national recommendations for restrictions regarding the COVID-19 Pandemic (e.g. social distancing, sheltering in place) in an effort to limit this patient's exposure and mitigate transmission in our community. All issues noted in this document were discussed and addressed.  A physical exam was not performed with this format.  I connected with Cynthia Mays on 08/28/22 at 1:38 pm by telephone and verified that I am speaking with the correct person using two identifiers. Cynthia Mays is currently located at [facility] and no one is currently with her during visit. The provider, [name], FNP is located in their office at time of visit.  I discussed the limitations, risks, security and privacy concerns of performing an evaluation and management service by telephone and the availability of in person appointments. I also discussed with the patient that there may be a patient responsible charge related to this service. The patient expressed understanding and agreed to proceed.   History and Present Illness:  Pt calls the office today with URI symptoms that started two days. States she had a negative COVID test at home.  URI  This is a new problem. The current episode started in the past 7 days. The problem has been unchanged. There has been no fever. Associated symptoms include congestion, coughing, ear pain, rhinorrhea, sinus pain and a sore throat. Pertinent negatives include no nausea or vomiting. She has tried decongestant and increased fluids for the symptoms. The treatment provided mild relief.      Review of Systems  HENT:  Positive for congestion, ear pain, rhinorrhea, sinus pain and sore throat.   Respiratory:  Positive for cough.   Gastrointestinal:  Negative for nausea and vomiting.     Observations/Objective: No SOB or congestion noted, nasal  congestion   Assessment and Plan: 1. Viral URI with cough - Take meds as prescribed - Use a cool mist humidifier  -Use saline nose sprays frequently -Force fluids -For any cough or congestion  Use plain Mucinex- regular strength or max strength is fine -For fever or aces or pains- take tylenol or ibuprofen. -Throat lozenges if help -Follow up if symptom worsen or do not improve  - cetirizine (ZYRTEC ALLERGY) 10 MG tablet; Take 1 tablet (10 mg total) by mouth daily.  Dispense: 90 tablet; Refill: 1 - benzonatate (TESSALON) 200 MG capsule; Take 1 capsule (200 mg total) by mouth 3 (three) times daily as needed.  Dispense: 30 capsule; Refill: 1 - promethazine-dextromethorphan (PROMETHAZINE-DM) 6.25-15 MG/5ML syrup; Take 5 mLs by mouth 3 (three) times daily as needed for cough.  Dispense: 240 mL; Refill: 0     I discussed the assessment and treatment plan with the patient. The patient was provided an opportunity to ask questions and all were answered. The patient agreed with the plan and demonstrated an understanding of the instructions.   The patient was advised to call back or seek an in-person evaluation if the symptoms worsen or if the condition fails to improve as anticipated.  The above assessment and management plan was discussed with the patient. The patient verbalized understanding of and has agreed to the management plan. Patient is aware to call the clinic if symptoms persist or worsen. Patient is aware when to return to the clinic for a follow-up visit. Patient educated on when it is appropriate to go to the emergency department.   Time call ended:  1:50 pm  I provided 12 minutes of  non face-to-face time during this encounter.    [name], FNP

## 2022-09-04 ENCOUNTER — Ambulatory Visit: Payer: 59 | Attending: Family

## 2022-09-04 DIAGNOSIS — M62830 Muscle spasm of back: Secondary | ICD-10-CM | POA: Diagnosis not present

## 2022-09-04 DIAGNOSIS — M5459 Other low back pain: Secondary | ICD-10-CM | POA: Insufficient documentation

## 2022-09-04 NOTE — Therapy (Signed)
OUTPATIENT PHYSICAL THERAPY THORACOLUMBAR TREATMENT   Patient Name: Cynthia Mays MRN: [id number] DOB:06-01-1941, 82 y.o., female Today's Date: 09/04/2022  END OF SESSION:  PT End of Session - 09/04/22 1035     Visit Number 3    Number of Visits 12    Date for PT Re-Evaluation 11/24/22    Authorization Type FOTO AT LEAST EVERY 5TH VISIT.  PROGRESS NOTE AT 10TH VISIT.  KX MODIFIER AFTER 15 VISITS.    PT Start Time 1030    PT Stop Time 1128    PT Time Calculation (min) 58 min             Past Medical History:  Diagnosis Date   Complication of anesthesia    Depression    Diabetes mellitus    Fibromyalgia    GERD (gastroesophageal reflux disease)    Hiatal hernia    Hyperlipidemia    Hypertension    Hypothyroidism    Hypothyroidism 07/23/2016   PONV (postoperative nausea and vomiting)    Past Surgical History:  Procedure Laterality Date   APPENDECTOMY  2002   arthroscopy  right 2002, left 2007   bilateral knees   BACK SURGERY  10/2007, 09/2008   x2   BIOPSY  10/02/2016   Procedure: BIOPSY;  Surgeon: Rogene Houston, MD;  [facility];  Service: Endoscopy;;  gastric   BIOPSY  05/04/2020   Procedure: BIOPSY;  Surgeon: Harvel Quale, MD;  [facility];  Service: Gastroenterology;;   [place]   bilateral   [place]   x2   COLONOSCOPY WITH PROPOFOL N/A 05/04/2020   Castaneda:The examined portion of the ileum was normal(focally active, nonspecific ileitis, neg for features of chronicity or granulomas)- A single (solitary) ulcer in the cecum.(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 tortuous colon, non bleeding internal hemorrhoids   ESOPHAGOGASTRODUODENOSCOPY N/A 10/02/2016   Procedure: ESOPHAGOGASTRODUODENOSCOPY (EGD);  Surgeon: Rogene Houston, MD;  [facility];  Service:  Endoscopy;  Laterality: N/A;  11:15 - moved to 2/1 @ 3:00   ESOPHAGOGASTRODUODENOSCOPY (EGD) WITH PROPOFOL N/A 05/04/2020   Castaneda: normal esophagus, 4cm hh with four cameron ulcers, multiple gastric polyps-fundic gland polyps, erythematous mucosa in pylorus, gastric antral mucosa and oxyntic mucosa with mild chronic gastritis and reactive changes, neg h pylori, normal examined duodenum   KNEE SURGERY     both   Patient Active Problem List   Diagnosis Date Noted   Controlled substance agreement signed 08/15/2022   History of lumbar fusion 12/02/2021   Insomnia 05/14/2021   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[place]) 03/19/2021   Colonic ulcer 08/13/2020   Morbid obesity ([place]) 03/31/2019   Chronic use of benzodiazepine for therapeutic purpose 01/26/2018   Hiatal hernia 10/29/2017   Hyperlipidemia associated with type 2 diabetes mellitus ([place]) 12/31/2015   Coronary artery disease due to lipid rich plaque 12/31/2015   Left knee pain 10/24/2015   Macular degeneration, 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   Essential hypertension 02/20/2015   Chronic low back pain with sciatica 02/20/2015   Gastroesophageal reflux disease without esophagitis 11/20/2014   Anxiety, generalized 11/20/2014   Hypothyroidism 12/27/2013   DM (diabetes mellitus) ([place]) 12/27/2013   Precordial pain 01/15/2013    REFERRING PROVIDER: Evelina Dun  REFERRING DIAG: Chronic right-sided low back pain with Sciatica.  Rationale for Evaluation and Treatment: Rehabilitation  THERAPY DIAG:  Muscle spasm of back  Other low back pain  ONSET DATE: ~2 months.  SUBJECTIVE:                                                                                                                                                                                           SUBJECTIVE STATEMENT:  Pt denies any pain today.  Reports going up and down a ladder yesterday without issue.    PERTINENT HISTORY:  HTN, DM, adhesive allergy, two lumbar surgeries,  Fibromyalgia.  PAIN:  Are you having pain? No  PRECAUTIONS: None  WEIGHT BEARING RESTRICTIONS: No  FALLS:  Has patient fallen in last 6 months? No  LIVING ENVIRONMENT: Lives with: lives alone Lives in: House/apartment Stairs: 4 with railing. Has following equipment at home: None  OCCUPATION: Retired.  PLOF: Independent with basic ADLs  PATIENT GOALS: Not have pain into right buttock and LE.  NEXT MD VISIT:   OBJECTIVE:   DIAGNOSTIC FINDINGS:  X-RAY:  IMPRESSION: Lumbar spondylosis. No recent fracture is seen. There is surgical fusion from L2-L4 levels.  PATIENT SURVEYS:  FOTO Complete.    POSTURE: No Significant postural limitations, rounded shoulders, and flexed trunk   PALPATION: Very tender to palpation over right low back musculature and buttock region.  LUMBAR ROM:   Active lumbar flexion limited by 25% and active extension to 15 degrees.   LOWER EXTREMITY MMT:   Normal LE strength.  LUMBAR SPECIAL TESTS:  Equal leg lengths.  Pain reproduction with right SLR.    GAIT: Slow transition from sit to stand and a slow and purposeful gait pattern.  TODAY'S TREATMENT:                                                                                                                              DATE:    09/04/22                                    EXERCISE LOG  Exercise Repetitions and Resistance Comments  Nustep  Lvl 3  x 15 mins        Blank cell = exercise not performed today     Modalities  Date:  Unattended Estim: Lumbar, IFC 80-150 hz, 15 mins, Pain Combo: Lumbar, 1.5 w/cm2, 10 mins, Pain  Manual Therapy Soft Tissue Mobilization: right low back, STW/M to right lumbar paraspinals and upper glute to decrease pain and tone with pt in left side-lying   ASSESSMENT:  CLINICAL IMPRESSION: Pt arrives for today's treatment session denying any pain.  Pt introduced to Nustep for warm up today without issue or complaint of pain.  STW/M performed to  right lumbar paraspinals and upper glute to decrease tone.  Normal responses to all modalities noted today.  Pt denied any pain at completion of today's treatment session.        OBJECTIVE IMPAIRMENTS: Abnormal gait, decreased activity tolerance, decreased mobility, decreased ROM, increased muscle spasms, postural dysfunction, and pain.   ACTIVITY LIMITATIONS: locomotion level  PARTICIPATION LIMITATIONS: meal prep, cleaning, and laundry  PERSONAL FACTORS: 1 comorbidity: h/o low back pain and two surgeries  are also affecting patient's functional outcome.   REHAB POTENTIAL: Good  CLINICAL DECISION MAKING: Evolving/moderate complexity  EVALUATION COMPLEXITY: Low   GOALS: SHORT TERM GOALS: Target date: 09/09/21  Ind with an HEP. Goal status: INITIAL   LONG TERM GOALS: Target date: 11/24/22  Eliminate right LE symptoms Goal status: INITIAL  2.  Walk a community distance with pain not > 2-3/10. Goal status: INITIAL  3.  Perform ADL's with pain not > 2-3/10. Goal status: INITIAL  PLAN:  PT FREQUENCY: 2x/week  PT DURATION: 6 weeks  PLANNED INTERVENTIONS: Therapeutic exercises, Therapeutic activity, Patient/Family education, Self Care, Dry Needling, Electrical stimulation, Cryotherapy, Moist heat, Ultrasound, and Manual therapy.  PLAN FOR NEXT SESSION: Combo e'stim/US, STW/M, core exercise progression.   Newman Pies, PTA 09/04/2022, 11:29 AM

## 2022-09-09 ENCOUNTER — Ambulatory Visit: Payer: 59

## 2022-09-11 ENCOUNTER — Ambulatory Visit: Payer: 59 | Admitting: Physical Therapy

## 2022-09-11 DIAGNOSIS — M5459 Other low back pain: Secondary | ICD-10-CM

## 2022-09-11 DIAGNOSIS — M62830 Muscle spasm of back: Secondary | ICD-10-CM | POA: Diagnosis not present

## 2022-09-11 NOTE — Therapy (Signed)
OUTPATIENT PHYSICAL THERAPY THORACOLUMBAR TREATMENT   Patient Name: Cynthia Mays MRN: [id number] DOB:11-Jun-1941, 82 y.o., female Today's Date: 09/11/2022  END OF SESSION:  PT End of Session - 09/11/22 1122     Visit Number 4    Number of Visits 12    Date for PT Re-Evaluation 11/24/22    Authorization Type FOTO AT LEAST EVERY 5TH VISIT.  PROGRESS NOTE AT 10TH VISIT.  KX MODIFIER AFTER 15 VISITS.    PT Start Time 1115    Activity Tolerance Patient tolerated treatment well    Behavior During Therapy [facility] for tasks assessed/performed             Past Medical History:  Diagnosis Date   Complication of anesthesia    Depression    Diabetes mellitus    Fibromyalgia    GERD (gastroesophageal reflux disease)    Hiatal hernia    Hyperlipidemia    Hypertension    Hypothyroidism    Hypothyroidism 07/23/2016   PONV (postoperative nausea and vomiting)    Past Surgical History:  Procedure Laterality Date   APPENDECTOMY  2002   arthroscopy  right 2002, left 2007   bilateral knees   BACK SURGERY  10/2007, 09/2008   x2   BIOPSY  10/02/2016   Procedure: BIOPSY;  Surgeon: Rogene Houston, MD;  [facility];  Service: Endoscopy;;  gastric   BIOPSY  05/04/2020   Procedure: BIOPSY;  Surgeon: Harvel Quale, MD;  [facility];  Service: Gastroenterology;;   [place]   bilateral   [place]   x2   COLONOSCOPY WITH PROPOFOL N/A 05/04/2020   Castaneda:The examined portion of the ileum was normal(focally active, nonspecific ileitis, neg for features of chronicity or granulomas)- A single (solitary) ulcer in the cecum.(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 tortuous colon, non bleeding internal hemorrhoids   ESOPHAGOGASTRODUODENOSCOPY N/A 10/02/2016   Procedure: ESOPHAGOGASTRODUODENOSCOPY (EGD);  Surgeon:  Rogene Houston, MD;  [facility];  Service: Endoscopy;  Laterality: N/A;  11:15 - moved to 2/1 @ 3:00   ESOPHAGOGASTRODUODENOSCOPY (EGD) WITH PROPOFOL N/A 05/04/2020   Castaneda: normal esophagus, 4cm hh with four cameron ulcers, multiple gastric polyps-fundic gland polyps, erythematous mucosa in pylorus, gastric antral mucosa and oxyntic mucosa with mild chronic gastritis and reactive changes, neg h pylori, normal examined duodenum   KNEE SURGERY     both   Patient Active Problem List   Diagnosis Date Noted   Controlled substance agreement signed 08/15/2022   History of lumbar fusion 12/02/2021   Insomnia 05/14/2021   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[place]) 03/19/2021   Colonic ulcer 08/13/2020   Morbid obesity ([place]) 03/31/2019   Chronic use of benzodiazepine for therapeutic purpose 01/26/2018   Hiatal hernia 10/29/2017   Hyperlipidemia associated with type 2 diabetes mellitus ([place]) 12/31/2015   Coronary artery disease due to lipid rich plaque 12/31/2015   Left knee pain 10/24/2015   Macular degeneration, 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   Essential hypertension 02/20/2015   Chronic low back pain with sciatica 02/20/2015   Gastroesophageal reflux disease without esophagitis 11/20/2014   Anxiety, generalized 11/20/2014   Hypothyroidism 12/27/2013   DM (diabetes mellitus) ([place]) 12/27/2013   Precordial pain 01/15/2013    REFERRING PROVIDER: Evelina Dun  REFERRING DIAG: Chronic right-sided low back pain with Sciatica.  Rationale for Evaluation and Treatment: Rehabilitation  THERAPY DIAG:  No  diagnosis found.  ONSET DATE: ~2 months.  SUBJECTIVE:                                                                                                                                                                                           SUBJECTIVE STATEMENT:  Pain high today.  A lot of driving and helping a friend in and out of car.  PERTINENT HISTORY:  HTN, DM, adhesive allergy, two lumbar  surgeries, Fibromyalgia.  PAIN:  Are you having pain? No  PRECAUTIONS: None  WEIGHT BEARING RESTRICTIONS: No  FALLS:  Has patient fallen in last 6 months? No  LIVING ENVIRONMENT: Lives with: lives alone Lives in: House/apartment Stairs: 4 with railing. Has following equipment at home: None  OCCUPATION: Retired.  PLOF: Independent with basic ADLs  PATIENT GOALS: Not have pain into right buttock and LE.  NEXT MD VISIT:   OBJECTIVE:   DIAGNOSTIC FINDINGS:  X-RAY:  IMPRESSION: Lumbar spondylosis. No recent fracture is seen. There is surgical fusion from L2-L4 levels.  PATIENT SURVEYS:  FOTO Complete.    POSTURE: No Significant postural limitations, rounded shoulders, and flexed trunk   PALPATION: Very tender to palpation over right low back musculature and buttock region.  LUMBAR ROM:   Active lumbar flexion limited by 25% and active extension to 15 degrees.   LOWER EXTREMITY MMT:   Normal LE strength.  LUMBAR SPECIAL TESTS:  Equal leg lengths.  Pain reproduction with right SLR.    GAIT: Slow transition from sit to stand and a slow and purposeful gait pattern.  TODAY'S TREATMENT:                                                                                                                              DATE:    09/04/22                                    EXERCISE LOG  Exercise Repetitions and Resistance Comments  Nustep  Lvl 3 x 10 mins  Blank cell = exercise not performed today     Modalities  Date: 09/11/22.  Manual Therapy 13 minutes.Marland KitchenMarland KitchenMarland KitchenSoft Tissue Mobilization: right low back, STW/M to right lumbar paraspinals and upper glute to decrease pain and tone with pt in left side-lying  f/b IFC at 80-150 Hz on 40% scan x 15 minutes.   ASSESSMENT:  CLINICAL IMPRESSION: Patient with increased pain due to driving and helping a friend in and out of a car.  Her right QL was very palpably tender.  Good response to soft tissue work that included  ischemic release technique.  Patient tolerated treatment without complaint with normal modality response following removal of modality.      OBJECTIVE IMPAIRMENTS: Abnormal gait, decreased activity tolerance, decreased mobility, decreased ROM, increased muscle spasms, postural dysfunction, and pain.   ACTIVITY LIMITATIONS: locomotion level  PARTICIPATION LIMITATIONS: meal prep, cleaning, and laundry  PERSONAL FACTORS: 1 comorbidity: h/o low back pain and two surgeries  are also affecting patient's functional outcome.   REHAB POTENTIAL: Good  CLINICAL DECISION MAKING: Evolving/moderate complexity  EVALUATION COMPLEXITY: Low   GOALS: SHORT TERM GOALS: Target date: 09/09/21  Ind with an HEP. Goal status: INITIAL   LONG TERM GOALS: Target date: 11/24/22  Eliminate right LE symptoms Goal status: INITIAL  2.  Walk a community distance with pain not > 2-3/10. Goal status: INITIAL  3.  Perform ADL's with pain not > 2-3/10. Goal status: INITIAL  PLAN:  PT FREQUENCY: 2x/week  PT DURATION: 6 weeks  PLANNED INTERVENTIONS: Therapeutic exercises, Therapeutic activity, Patient/Family education, Self Care, Dry Needling, Electrical stimulation, Cryotherapy, Moist heat, Ultrasound, and Manual therapy.  PLAN FOR NEXT SESSION: Combo e'stim/US, STW/M, core exercise progression.   Emaan Gary, [place], PT 09/11/2022, 11:24 AM

## 2022-09-15 ENCOUNTER — Ambulatory Visit: Payer: 59

## 2022-09-15 DIAGNOSIS — M5459 Other low back pain: Secondary | ICD-10-CM

## 2022-09-15 DIAGNOSIS — M62830 Muscle spasm of back: Secondary | ICD-10-CM

## 2022-09-15 NOTE — Therapy (Signed)
OUTPATIENT PHYSICAL THERAPY THORACOLUMBAR TREATMENT   Patient Name: Cynthia Mays MRN: [id number] DOB:21-Feb-1941, 82 y.o., female Today's Date: 09/15/2022  END OF SESSION:  PT End of Session - 09/15/22 1117     Visit Number 5    Number of Visits 12    Date for PT Re-Evaluation 11/24/22    Authorization Type FOTO AT LEAST EVERY 5TH VISIT.  PROGRESS NOTE AT 10TH VISIT.  KX MODIFIER AFTER 15 VISITS.    PT Start Time 1115    PT Stop Time 1210    PT Time Calculation (min) 55 min    Activity Tolerance Patient tolerated treatment well    Behavior During Therapy [facility] for tasks assessed/performed             Past Medical History:  Diagnosis Date   Complication of anesthesia    Depression    Diabetes mellitus    Fibromyalgia    GERD (gastroesophageal reflux disease)    Hiatal hernia    Hyperlipidemia    Hypertension    Hypothyroidism    Hypothyroidism 07/23/2016   PONV (postoperative nausea and vomiting)    Past Surgical History:  Procedure Laterality Date   APPENDECTOMY  2002   arthroscopy  right 2002, left 2007   bilateral knees   BACK SURGERY  10/2007, 09/2008   x2   BIOPSY  10/02/2016   Procedure: BIOPSY;  Surgeon: Rogene Houston, MD;  [facility];  Service: Endoscopy;;  gastric   BIOPSY  05/04/2020   Procedure: BIOPSY;  Surgeon: Harvel Quale, MD;  [facility];  Service: Gastroenterology;;   [place]   bilateral   [place]   x2   COLONOSCOPY WITH PROPOFOL N/A 05/04/2020   Castaneda:The examined portion of the ileum was normal(focally active, nonspecific ileitis, neg for features of chronicity or granulomas)- A single (solitary) ulcer in the cecum.(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 tortuous colon, non bleeding internal hemorrhoids   ESOPHAGOGASTRODUODENOSCOPY N/A 10/02/2016    Procedure: ESOPHAGOGASTRODUODENOSCOPY (EGD);  Surgeon: Rogene Houston, MD;  [facility];  Service: Endoscopy;  Laterality: N/A;  11:15 - moved to 2/1 @ 3:00   ESOPHAGOGASTRODUODENOSCOPY (EGD) WITH PROPOFOL N/A 05/04/2020   Castaneda: normal esophagus, 4cm hh with four cameron ulcers, multiple gastric polyps-fundic gland polyps, erythematous mucosa in pylorus, gastric antral mucosa and oxyntic mucosa with mild chronic gastritis and reactive changes, neg h pylori, normal examined duodenum   KNEE SURGERY     both   Patient Active Problem List   Diagnosis Date Noted   Controlled substance agreement signed 08/15/2022   History of lumbar fusion 12/02/2021   Insomnia 05/14/2021   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[place]) 03/19/2021   Colonic ulcer 08/13/2020   Morbid obesity ([place]) 03/31/2019   Chronic use of benzodiazepine for therapeutic purpose 01/26/2018   Hiatal hernia 10/29/2017   Hyperlipidemia associated with type 2 diabetes mellitus ([place]) 12/31/2015   Coronary artery disease due to lipid rich plaque 12/31/2015   Left knee pain 10/24/2015   Macular degeneration, 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   Essential hypertension 02/20/2015   Chronic low back pain with sciatica 02/20/2015   Gastroesophageal reflux disease without esophagitis 11/20/2014   Anxiety, generalized 11/20/2014   Hypothyroidism 12/27/2013   DM (diabetes mellitus) ([place]) 12/27/2013   Precordial pain 01/15/2013    REFERRING PROVIDER: Evelina Dun  REFERRING DIAG: Chronic right-sided low  back pain with Sciatica.  Rationale for Evaluation and Treatment: Rehabilitation  THERAPY DIAG:  Muscle spasm of back  Other low back pain  ONSET DATE: ~2 months.  SUBJECTIVE:                                                                                                                                                                                           SUBJECTIVE STATEMENT:  Pain high today.  A lot of driving and helping a  friend in and out of car.  PERTINENT HISTORY:  HTN, DM, adhesive allergy, two lumbar surgeries, Fibromyalgia.  PAIN:  Are you having pain? No  PRECAUTIONS: None  WEIGHT BEARING RESTRICTIONS: No  FALLS:  Has patient fallen in last 6 months? No  LIVING ENVIRONMENT: Lives with: lives alone Lives in: House/apartment Stairs: 4 with railing. Has following equipment at home: None  OCCUPATION: Retired.  PLOF: Independent with basic ADLs  PATIENT GOALS: Not have pain into right buttock and LE.  NEXT MD VISIT:   OBJECTIVE:   DIAGNOSTIC FINDINGS:  X-RAY:  IMPRESSION: Lumbar spondylosis. No recent fracture is seen. There is surgical fusion from L2-L4 levels.  PATIENT SURVEYS:  FOTO Complete.    POSTURE: No Significant postural limitations, rounded shoulders, and flexed trunk   PALPATION: Very tender to palpation over right low back musculature and buttock region.  LUMBAR ROM:   Active lumbar flexion limited by 25% and active extension to 15 degrees.   LOWER EXTREMITY MMT:   Normal LE strength.  LUMBAR SPECIAL TESTS:  Equal leg lengths.  Pain reproduction with right SLR.    GAIT: Slow transition from sit to stand and a slow and purposeful gait pattern.  TODAY'S TREATMENT:                                                                                                                              DATE:    09/15/22  EXERCISE LOG  Exercise Repetitions and Resistance Comments  Nustep  Lvl 3 x 15 mins        Blank cell = exercise not performed today     Modalities  Date:  Unattended Estim: Lumbar, IFC 80-150 Hz, 15 mins, Pain and Tone Combo: Lumbar, 1.5 w/cm2, 8 mins, Pain and Tone  Manual Therapy Soft Tissue Mobilization: right low back, STW/M to right lumbar paraspinals and upper glute to decrease pain and tone with pt in left side-lying     ASSESSMENT:  CLINICAL IMPRESSION: Pt arrives for today's treatment session  reporting 5/10 low back pain. Pt able to increase FOTO score to 52 today and is making progress towards all of her goals at this time.  Pt reports back pain has increased since last night and is unsure of the cause.  Pt able to tolerate increased time on Nustep today without issue or complaint.  STW/M performed to right lumbar paraspinals and QL to decrease pain and tone. Normal responses to all modalities noted upon completion.  Pt reported decreased pain at completion of today's treatment session.    OBJECTIVE IMPAIRMENTS: Abnormal gait, decreased activity tolerance, decreased mobility, decreased ROM, increased muscle spasms, postural dysfunction, and pain.   ACTIVITY LIMITATIONS: locomotion level  PARTICIPATION LIMITATIONS: meal prep, cleaning, and laundry  PERSONAL FACTORS: 1 comorbidity: h/o low back pain and two surgeries  are also affecting patient's functional outcome.   REHAB POTENTIAL: Good  CLINICAL DECISION MAKING: Evolving/moderate complexity  EVALUATION COMPLEXITY: Low   GOALS: SHORT TERM GOALS: Target date: 09/09/21  Ind with an HEP. Goal status: MET   LONG TERM GOALS: Target date: 11/24/22  Eliminate right LE symptoms Goal status: IN PROGRESS  2.  Walk a community distance with pain not > 2-3/10. Goal status: IN PROGRESS  3.  Perform ADL's with pain not > 2-3/10. Goal status: IN PROGRESS  PLAN:  PT FREQUENCY: 2x/week  PT DURATION: 6 weeks  PLANNED INTERVENTIONS: Therapeutic exercises, Therapeutic activity, Patient/Family education, Self Care, Dry Needling, Electrical stimulation, Cryotherapy, Moist heat, Ultrasound, and Manual therapy.  PLAN FOR NEXT SESSION: Combo e'stim/US, STW/M, core exercise progression.   Kathrynn Ducking, PTA 09/15/2022, 12:10 PM

## 2022-09-16 ENCOUNTER — Other Ambulatory Visit: Payer: Self-pay

## 2022-09-16 ENCOUNTER — Emergency Department (HOSPITAL_COMMUNITY)
Admission: EM | Admit: 2022-09-16 | Discharge: 2022-09-17 | Disposition: A | Discharge status: Home / Self Care | Payer: 59 | Attending: Emergency Medicine | Admitting: Emergency Medicine

## 2022-09-16 ENCOUNTER — Emergency Department (HOSPITAL_COMMUNITY): Payer: 59

## 2022-09-16 DIAGNOSIS — M5459 Other low back pain: Secondary | ICD-10-CM | POA: Diagnosis not present

## 2022-09-16 DIAGNOSIS — E039 Hypothyroidism, unspecified: Secondary | ICD-10-CM | POA: Insufficient documentation

## 2022-09-16 DIAGNOSIS — Z87891 Personal history of nicotine dependence: Secondary | ICD-10-CM | POA: Diagnosis not present

## 2022-09-16 DIAGNOSIS — E119 Type 2 diabetes mellitus without complications: Secondary | ICD-10-CM | POA: Insufficient documentation

## 2022-09-16 DIAGNOSIS — I10 Essential (primary) hypertension: Secondary | ICD-10-CM | POA: Diagnosis not present

## 2022-09-16 DIAGNOSIS — M545 Low back pain, unspecified: Secondary | ICD-10-CM | POA: Insufficient documentation

## 2022-09-16 MED ORDER — ACETAMINOPHEN 500 MG PO TABS
1000.0000 mg | ORAL_TABLET | Freq: Once | ORAL | Status: AC
  Administered 2022-09-16: 1000 mg via ORAL
  Filled 2022-09-16: qty 2

## 2022-09-16 MED ORDER — HYDROMORPHONE HCL 1 MG/ML IJ SOLN
1.0000 mg | Freq: Once | INTRAMUSCULAR | Status: AC
  Administered 2022-09-16: 1 mg via INTRAMUSCULAR
  Filled 2022-09-16: qty 1

## 2022-09-16 NOTE — ED Provider Notes (Signed)
[facility] Emergency Department Provider Note MRN:  [id number]  Arrival date & time: 09/17/22     Chief Complaint   Back pain History of Present Illness   Cynthia Mays is a 82 y.o. year-old female with a history of diabetes, hypertension, fibromyalgia presenting to the ED with chief complaint of back pain.  Patient was sitting on the toilet and bent down to grab her pants and threw them in the hamper.  During the bending down she experienced a sudden onset pain to her lower back.  Described as a meat fork going into her back.  Severe pain since then, started about 7 hours ago.  Denies numbness or weakness to the arms or legs, no bowel or bladder dysfunction.  No fever, no fall or trauma.  History of sciatica but this is different, pain does not radiate down the legs.  Has a history of hardware in the back.  Review of Systems  A thorough review of systems was obtained and all systems are negative except as noted in the HPI and PMH.   Patient's Health History    Past Medical History:  Diagnosis Date   Complication of anesthesia    Depression    Diabetes mellitus    Fibromyalgia    GERD (gastroesophageal reflux disease)    Hiatal hernia    Hyperlipidemia    Hypertension    Hypothyroidism    Hypothyroidism 07/23/2016   PONV (postoperative nausea and vomiting)     Past Surgical History:  Procedure Laterality Date   APPENDECTOMY  2002   arthroscopy  right 2002, left 2007   bilateral knees   BACK SURGERY  10/2007, 09/2008   x2   BIOPSY  10/02/2016   Procedure: BIOPSY;  Surgeon: Rogene Houston, MD;  [facility];  Service: Endoscopy;;  gastric   BIOPSY  05/04/2020   Procedure: BIOPSY;  Surgeon: Harvel Quale, MD;  [facility];  Service: Gastroenterology;;   [place]   bilateral   [place]   x2   COLONOSCOPY WITH PROPOFOL N/A 05/04/2020   Castaneda:The examined portion of  the ileum was normal(focally active, nonspecific ileitis, neg for features of chronicity or granulomas)- A single (solitary) ulcer in the cecum.(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 tortuous colon, non bleeding internal hemorrhoids   ESOPHAGOGASTRODUODENOSCOPY N/A 10/02/2016   Procedure: ESOPHAGOGASTRODUODENOSCOPY (EGD);  Surgeon: Rogene Houston, MD;  [facility];  Service: Endoscopy;  Laterality: N/A;  11:15 - moved to 2/1 @ 3:00   ESOPHAGOGASTRODUODENOSCOPY (EGD) WITH PROPOFOL N/A 05/04/2020   Castaneda: normal esophagus, 4cm hh with four cameron ulcers, multiple gastric polyps-fundic gland polyps, erythematous mucosa in pylorus, gastric antral mucosa and oxyntic mucosa with mild chronic gastritis and reactive changes, neg h pylori, normal examined duodenum   KNEE SURGERY     both    Family History  Problem Relation Age of Onset   Anesthesia problems Mother    CAD Mother 63   Cancer Mother        Pancreatic cancer   Hypertension Father    Suicidality Father        gunshot   Deafness Sister    Heart disease Brother    Suicidality Son    Cancer Sister        female    Suicidality Brother    Hypotension Neg Hx  Pseudochol deficiency Neg Hx    Malignant hyperthermia Neg Hx     Social History   Socioeconomic History   Marital status: Widowed    Spouse name: Not on file   Number of children: 4   Years of education: 23   Highest education level: 12th grade  Occupational History    Employer: RETIRED  Tobacco Use   Smoking status: Former    Packs/day: 1.00    Years: 43.00    Total pack years: 43.00    Types: Cigarettes    Quit date: 05/02/1970    Years since quitting: 52.4   Smokeless tobacco: Never   Tobacco comments:    > 20 years quit  Vaping Use   Vaping Use: Never used  Substance and Sexual Activity   Alcohol use: No    Comment: occasionally  wine   Drug use: No   Sexual activity: Not Currently    Birth control/protection: Post-menopausal  Other Topics Concern   Not on file  Social History Narrative   Lives alone.  Widowed    1 dog, 2 cats   2 daughters, 1 local   Enjoys dancing and sewing   Daughter lives 5 miles away, grandchildren grown and visit often   Social Determinants of Health   Financial Resource Strain: Low Risk  (12/02/2021)   Overall Financial Resource Strain (CARDIA)    Difficulty of Paying Living Expenses: Not very hard  Food Insecurity: No Food Insecurity (12/02/2021)   Hunger Vital Sign    Worried About Running Out of Food in the Last Year: Never true    Ran Out of Food in the Last Year: Never true  Transportation Needs: No Transportation Needs (12/02/2021)   PRAPARE - Administrator, Civil Service (Medical): No    Lack of Transportation (Non-Medical): No  Physical Activity: Insufficiently Active (12/02/2021)   Exercise Vital Sign    Days of Exercise per Week: 7 days    Minutes of Exercise per Session: 20 min  Stress: No Stress Concern Present (12/02/2021)   [employer] of Occupational Health - Occupational Stress Questionnaire    Feeling of Stress : Only a little  Social Connections: Moderately Integrated (12/02/2021)   Social Connection and Isolation Panel [NHANES]    Frequency of Communication with Friends and Family: More than three times a week    Frequency of Social Gatherings with Friends and Family: Once a week    Attends Religious Services: More than 4 times per year    Active Member of [employer] or Organizations: Yes    Attends Banker Meetings: More than 4 times per year    Marital Status: Widowed  Intimate Partner Violence: Not At Risk (12/02/2021)   Humiliation, Afraid, Rape, and Kick questionnaire    Fear of Current or Ex-Partner: No    Emotionally Abused: No    Physically Abused: No    Sexually Abused: No     Physical Exam   Vitals:   09/17/22 0100 09/17/22  0130  BP: (!) 140/70 136/73  Pulse: (!) 48 (!) 47  Resp:  18  Temp:    SpO2: 95% 93%    CONSTITUTIONAL: Well-appearing, NAD NEURO/PSYCH:  Alert and oriented x 3, no focal deficits EYES:  eyes equal and reactive ENT/NECK:  no LAD, no JVD CARDIO: Regular rate, well-perfused, normal S1 and S2 PULM:  CTAB no wheezing or rhonchi GI/GU:  non-distended, non-tender MSK/SPINE:  No gross deformities, no edema, tender to palpation to the  spinal and paraspinal lumbar spine SKIN:  no rash, atraumatic   *Additional and/or pertinent findings included in MDM below  Diagnostic and Interventional Summary    EKG Interpretation  Date/Time:    Ventricular Rate:    PR Interval:    QRS Duration:   QT Interval:    QTC Calculation:   R Axis:     Text Interpretation:         Labs Reviewed - No data to display  CT Lumbar Spine Wo Contrast  Final Result      Medications  HYDROmorphone (DILAUDID) injection 1 mg (1 mg Intramuscular Given 09/16/22 2341)  acetaminophen (TYLENOL) tablet 1,000 mg (1,000 mg Oral Given 09/16/22 2341)     Procedures  /  Critical Care Procedures  ED Course and Medical Decision Making  Initial Impression and Ddx Differential diagnosis includes muscular strain or spasm, compression fracture, hardware fracture or displacement.  Patient has reassuring motor function and sensation to the legs, no bowel or bladder dysfunction, doubt myelopathy.  Given her age and history of hardware, will obtain advanced imaging.  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  Patient management required discussion with the following services or  consulting groups:  None  Complexity of Problems Addressed Acute illness or injury that poses threat of life of bodily function  Additional Data Reviewed and Analyzed Further history obtained from: Further history from spouse/family member  Additional Factors Impacting ED Encounter Risk None  Barth Kirks. [place] Small, [place] [email or web address]  Final Clinical Impressions(s) / ED Diagnoses     ICD-10-CM   1. Acute midline low back pain without sciatica  M54.50       ED Discharge Orders     None        Discharge Instructions Discussed with and Provided to Patient:     Discharge Instructions      You were evaluated in the Emergency Department and after careful evaluation, we did not find any emergent condition requiring admission or further testing in the hospital.  Your exam/testing today is overall reassuring.  Symptoms likely due to a muscular strain or spasm.  Your CT scan did not show any broken bones or emergencies.  Recommend continuing your home pain medicine and following up with your spinal expert.  Please return to the Emergency Department if you experience any worsening of your condition.   Thank you for allowing [place] to be a part of your care.       Maudie Flakes, MD 09/17/22 [phone]

## 2022-09-16 NOTE — ED Triage Notes (Addendum)
Pt has chronic back pain and is in therapy for sciatica. Pt states when was bending over to get something off floor when she felt sudden severe pain in lower back. Reports she is now having difficulty walking.   Pt took oxycodone and muscle relaxer with no pain relief

## 2022-09-17 DIAGNOSIS — M545 Low back pain, unspecified: Secondary | ICD-10-CM | POA: Diagnosis not present

## 2022-09-17 NOTE — Discharge Instructions (Signed)
You were evaluated in the Emergency Department and after careful evaluation, we did not find any emergent condition requiring admission or further testing in the hospital.  Your exam/testing today is overall reassuring.  Symptoms likely due to a muscular strain or spasm.  Your CT scan did not show any broken bones or emergencies.  Recommend continuing your home pain medicine and following up with your spinal expert.  Please return to the Emergency Department if you experience any worsening of your condition.   Thank you for allowing [place] to be a part of your care.

## 2022-09-17 NOTE — ED Notes (Signed)
Went over [employer]. Pt ambulatory with a cane to wheelchair. Wheeled out to lobby.

## 2022-09-18 ENCOUNTER — Ambulatory Visit: Payer: 59 | Admitting: *Deleted

## 2022-09-18 DIAGNOSIS — M62830 Muscle spasm of back: Secondary | ICD-10-CM | POA: Diagnosis not present

## 2022-09-18 DIAGNOSIS — M5459 Other low back pain: Secondary | ICD-10-CM | POA: Diagnosis not present

## 2022-09-18 NOTE — Therapy (Signed)
OUTPATIENT PHYSICAL THERAPY THORACOLUMBAR TREATMENT   Patient Name: Cynthia Mays MRN: [id number] DOB:1940/09/28, 82 y.o., female Today's Date: 09/18/2022  END OF SESSION:  PT End of Session - 09/18/22 1149     Visit Number 6    Number of Visits 12    Date for PT Re-Evaluation 11/24/22    Authorization Type FOTO AT LEAST EVERY 5TH VISIT.  PROGRESS NOTE AT 10TH VISIT.  KX MODIFIER AFTER 15 VISITS.    PT Start Time 1115    PT Stop Time 1202    PT Time Calculation (min) 47 min             Past Medical History:  Diagnosis Date   Complication of anesthesia    Depression    Diabetes mellitus    Fibromyalgia    GERD (gastroesophageal reflux disease)    Hiatal hernia    Hyperlipidemia    Hypertension    Hypothyroidism    Hypothyroidism 07/23/2016   PONV (postoperative nausea and vomiting)    Past Surgical History:  Procedure Laterality Date   APPENDECTOMY  2002   arthroscopy  right 2002, left 2007   bilateral knees   BACK SURGERY  10/2007, 09/2008   x2   BIOPSY  10/02/2016   Procedure: BIOPSY;  Surgeon: Rogene Houston, MD;  [facility];  Service: Endoscopy;;  gastric   BIOPSY  05/04/2020   Procedure: BIOPSY;  Surgeon: Harvel Quale, MD;  [facility];  Service: Gastroenterology;;   [place]   bilateral   [place]   x2   COLONOSCOPY WITH PROPOFOL N/A 05/04/2020   Castaneda:The examined portion of the ileum was normal(focally active, nonspecific ileitis, neg for features of chronicity or granulomas)- A single (solitary) ulcer in the cecum.(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 tortuous colon, non bleeding internal hemorrhoids   ESOPHAGOGASTRODUODENOSCOPY N/A 10/02/2016   Procedure: ESOPHAGOGASTRODUODENOSCOPY (EGD);  Surgeon: Rogene Houston, MD;  [facility];  Service:  Endoscopy;  Laterality: N/A;  11:15 - moved to 2/1 @ 3:00   ESOPHAGOGASTRODUODENOSCOPY (EGD) WITH PROPOFOL N/A 05/04/2020   Castaneda: normal esophagus, 4cm hh with four cameron ulcers, multiple gastric polyps-fundic gland polyps, erythematous mucosa in pylorus, gastric antral mucosa and oxyntic mucosa with mild chronic gastritis and reactive changes, neg h pylori, normal examined duodenum   KNEE SURGERY     both   Patient Active Problem List   Diagnosis Date Noted   Controlled substance agreement signed 08/15/2022   History of lumbar fusion 12/02/2021   Insomnia 05/14/2021   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[place]) 03/19/2021   Colonic ulcer 08/13/2020   Morbid obesity ([place]) 03/31/2019   Chronic use of benzodiazepine for therapeutic purpose 01/26/2018   Hiatal hernia 10/29/2017   Hyperlipidemia associated with type 2 diabetes mellitus ([place]) 12/31/2015   Coronary artery disease due to lipid rich plaque 12/31/2015   Left knee pain 10/24/2015   Macular degeneration, 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   Essential hypertension 02/20/2015   Chronic low back pain with sciatica 02/20/2015   Gastroesophageal reflux disease without esophagitis 11/20/2014   Anxiety, generalized 11/20/2014   Hypothyroidism 12/27/2013   DM (diabetes mellitus) ([place]) 12/27/2013   Precordial pain 01/15/2013    REFERRING PROVIDER: Evelina Dun  REFERRING DIAG: Chronic right-sided low back pain with Sciatica.  Rationale for Evaluation and Treatment: Rehabilitation  THERAPY DIAG:  Muscle spasm of back  ONSET DATE: ~2 months.  SUBJECTIVE:                                                                                                                                                                                           SUBJECTIVE STATEMENT:  Pain high today.  I bent over to pick up my pajamas and strained my back. I went to [facility] and they did an X-ray and was negative    PERTINENT HISTORY:  HTN, DM, adhesive allergy, two lumbar  surgeries, Fibromyalgia.  PAIN:  Are you having pain? Yes 8-9/10 today from straining it.  PRECAUTIONS: None  WEIGHT BEARING RESTRICTIONS: No  FALLS:  Has patient fallen in last 6 months? No  LIVING ENVIRONMENT: Lives with: lives alone Lives in: House/apartment Stairs: 4 with railing. Has following equipment at home: None  OCCUPATION: Retired.  PLOF: Independent with basic ADLs  PATIENT GOALS: Not have pain into right buttock and LE.  NEXT MD VISIT:   OBJECTIVE:   DIAGNOSTIC FINDINGS:  X-RAY:  IMPRESSION: Lumbar spondylosis. No recent fracture is seen. There is surgical fusion from L2-L4 levels.  PATIENT SURVEYS:  FOTO Complete.    POSTURE: No Significant postural limitations, rounded shoulders, and flexed trunk   PALPATION: Very tender to palpation over right low back musculature and buttock region.  LUMBAR ROM:   Active lumbar flexion limited by 25% and active extension to 15 degrees.   LOWER EXTREMITY MMT:   Normal LE strength.  LUMBAR SPECIAL TESTS:  Equal leg lengths.  Pain reproduction with right SLR.    GAIT: Slow transition from sit to stand and a slow and purposeful gait pattern.  TODAY'S TREATMENT:                                                                                                                              DATE:    09/18/22                                    EXERCISE  LOG  Exercise Repetitions and Resistance Comments  Nustep          Blank cell = exercise not performed today     Modalities  Date:  Unattended Estim: Lumbar, IFC 80-150 Hz, 15 mins, Pain and Tone Combo: Lumbar, 1.5 w/cm2, 12 mins, Pain and Tone  Manual Therapy Soft Tissue Mobilization: right low back, STW/M to Bil.  lumbar paraspinals and upper glute to decrease pain and tone with pt in left side-lying     ASSESSMENT:  CLINICAL IMPRESSION: Pt arrives for today's treatment session reporting 8-9/10 low back pain due to bending over at home picking up  my pajamas. Pt not feeling like performing any exercises today and Rx focused on [place] combo and STW as well as IFC to decrease pain. Pt reported decreased pain at completion of today's treatment session.    OBJECTIVE IMPAIRMENTS: Abnormal gait, decreased activity tolerance, decreased mobility, decreased ROM, increased muscle spasms, postural dysfunction, and pain.   ACTIVITY LIMITATIONS: locomotion level  PARTICIPATION LIMITATIONS: meal prep, cleaning, and laundry  PERSONAL FACTORS: 1 comorbidity: h/o low back pain and two surgeries  are also affecting patient's functional outcome.   REHAB POTENTIAL: Good  CLINICAL DECISION MAKING: Evolving/moderate complexity  EVALUATION COMPLEXITY: Low   GOALS: SHORT TERM GOALS: Target date: 09/09/21  Ind with an HEP. Goal status: MET   LONG TERM GOALS: Target date: 11/24/22  Eliminate right LE symptoms Goal status: IN PROGRESS  2.  Walk a community distance with pain not > 2-3/10. Goal status: IN PROGRESS  3.  Perform ADL's with pain not > 2-3/10. Goal status: IN PROGRESS  PLAN:  PT FREQUENCY: 2x/week  PT DURATION: 6 weeks  PLANNED INTERVENTIONS: Therapeutic exercises, Therapeutic activity, Patient/Family education, Self Care, Dry Needling, Electrical stimulation, Cryotherapy, Moist heat, Ultrasound, and Manual therapy.  PLAN FOR NEXT SESSION: Combo e'stim/US, STW/M, core exercise progression.   Dariann Huckaba,CHRIS, PTA 09/18/2022, 12:02 PM

## 2022-09-23 ENCOUNTER — Ambulatory Visit: Payer: 59 | Admitting: Physical Therapy

## 2022-09-23 DIAGNOSIS — M5459 Other low back pain: Secondary | ICD-10-CM

## 2022-09-23 DIAGNOSIS — M62830 Muscle spasm of back: Secondary | ICD-10-CM | POA: Diagnosis not present

## 2022-09-23 NOTE — Therapy (Signed)
OUTPATIENT PHYSICAL THERAPY THORACOLUMBAR TREATMENT   Patient Name: Cynthia Mays MRN: [id number] DOB:11-Sep-1940, 82 y.o., female Today's Date: 09/23/2022  END OF SESSION:  PT End of Session - 09/23/22 1106     Visit Number 7    Number of Visits 12    Date for PT Re-Evaluation 11/24/22    Authorization Type FOTO AT LEAST EVERY 5TH VISIT.  PROGRESS NOTE AT 10TH VISIT.  KX MODIFIER AFTER 15 VISITS.    PT Start Time 1030    PT Stop Time 1125    PT Time Calculation (min) 55 min    Activity Tolerance Patient tolerated treatment well    Behavior During Therapy WFL for tasks assessed/performed             Past Medical History:  Diagnosis Date   Complication of anesthesia    Depression    Diabetes mellitus    Fibromyalgia    GERD (gastroesophageal reflux disease)    Hiatal hernia    Hyperlipidemia    Hypertension    Hypothyroidism    Hypothyroidism 07/23/2016   PONV (postoperative nausea and vomiting)    Past Surgical History:  Procedure Laterality Date   APPENDECTOMY  2002   arthroscopy  right 2002, left 2007   bilateral knees   BACK SURGERY  10/2007, 09/2008   x2   BIOPSY  10/02/2016   Procedure: BIOPSY;  Surgeon: Malissa Hippo, MD;  [facility];  Service: Endoscopy;;  gastric   BIOPSY  05/04/2020   Procedure: BIOPSY;  Surgeon: Dolores Frame, MD;  [facility];  Service: Gastroenterology;;   CARPAL TUNNEL RELEASE  1986   bilateral   CESAREAN SECTION  1969, 1967   x2   COLONOSCOPY WITH PROPOFOL N/A 05/04/2020   Castaneda:The examined portion of the ileum was normal(focally active, nonspecific ileitis, neg for features of chronicity or granulomas)- A single (solitary) ulcer in the cecum.(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 tortuous colon, non bleeding internal hemorrhoids   ESOPHAGOGASTRODUODENOSCOPY N/A 10/02/2016    Procedure: ESOPHAGOGASTRODUODENOSCOPY (EGD);  Surgeon: Malissa Hippo, MD;  [facility];  Service: Endoscopy;  Laterality: N/A;  11:15 - moved to 2/1 @ 3:00   ESOPHAGOGASTRODUODENOSCOPY (EGD) WITH PROPOFOL N/A 05/04/2020   Castaneda: normal esophagus, 4cm hh with four cameron ulcers, multiple gastric polyps-fundic gland polyps, erythematous mucosa in pylorus, gastric antral mucosa and oxyntic mucosa with mild chronic gastritis and reactive changes, neg h pylori, normal examined duodenum   KNEE SURGERY     both   Patient Active Problem List   Diagnosis Date Noted   Controlled substance agreement signed 08/15/2022   History of lumbar fusion 12/02/2021   Insomnia 05/14/2021   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   Colonic ulcer 08/13/2020   Morbid obesity ([facility]) 03/31/2019   Chronic use of benzodiazepine for therapeutic purpose 01/26/2018   Hiatal hernia 10/29/2017   Hyperlipidemia associated with type 2 diabetes mellitus ([facility]) 12/31/2015   Coronary artery disease due to lipid rich plaque 12/31/2015   Left knee pain 10/24/2015   Macular degeneration, 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   Essential hypertension 02/20/2015   Chronic low back pain with sciatica 02/20/2015   Gastroesophageal reflux disease without esophagitis 11/20/2014   Anxiety, generalized 11/20/2014   Hypothyroidism 12/27/2013   DM (diabetes mellitus) ([facility]) 12/27/2013   Precordial pain 01/15/2013    REFERRING PROVIDER: [name]  REFERRING DIAG: Chronic right-sided low  back pain with Sciatica.  Rationale for Evaluation and Treatment: Rehabilitation  THERAPY DIAG:  Muscle spasm of back  Other low back pain  ONSET DATE: ~2 months.  SUBJECTIVE:                                                                                                                                                                                           SUBJECTIVE STATEMENT: Actually having a good day.  Some pain on left  today.  PERTINENT HISTORY:  HTN, DM, adhesive allergy, two lumbar surgeries, Fibromyalgia.  PAIN:  Are you having pain? Yes 2-3/10 today from straining it.  PRECAUTIONS: None  WEIGHT BEARING RESTRICTIONS: No  FALLS:  Has patient fallen in last 6 months? No  LIVING ENVIRONMENT: Lives with: lives alone Lives in: House/apartment Stairs: 4 with railing. Has following equipment at home: None  OCCUPATION: Retired.  PLOF: Independent with basic ADLs  PATIENT GOALS: Not have pain into right buttock and LE.  NEXT MD VISIT:   OBJECTIVE:   DIAGNOSTIC FINDINGS:  X-RAY:  IMPRESSION: Lumbar spondylosis. No recent fracture is seen. There is surgical fusion from L2-L4 levels.  PATIENT SURVEYS:  FOTO Complete.    POSTURE: No Significant postural limitations, rounded shoulders, and flexed trunk   PALPATION: Very tender to palpation over right low back musculature and buttock region.  LUMBAR ROM:   Active lumbar flexion limited by 25% and active extension to 15 degrees.   LOWER EXTREMITY MMT:   Normal LE strength.  LUMBAR SPECIAL TESTS:  Equal leg lengths.  Pain reproduction with right SLR.    GAIT: Slow transition from sit to stand and a slow and purposeful gait pattern.  TODAY'S TREATMENT:                                                                                                                              DATE:    09/23/22        Modalities  Date:  [place] at 1.50 W/CM2 x 12 minutes f/b STW/M x 11 minutes to bilateral lumbar region f/b HMP at 80-150  Hz on 40% scan x 20 minutes.  Patient tolerated treatment without complaint with normal modality response following removal of modality.   ASSESSMENT:  CLINICAL IMPRESSION: Patient doing well today.  She was found to have some palpable tenderness over her left SIJ with a good response to STW/M  OBJECTIVE IMPAIRMENTS: Abnormal gait, decreased activity tolerance, decreased mobility, decreased ROM, increased  muscle spasms, postural dysfunction, and pain.   ACTIVITY LIMITATIONS: locomotion level  PARTICIPATION LIMITATIONS: meal prep, cleaning, and laundry  PERSONAL FACTORS: 1 comorbidity: h/o low back pain and two surgeries  are also affecting patient's functional outcome.   REHAB POTENTIAL: Good  CLINICAL DECISION MAKING: Evolving/moderate complexity  EVALUATION COMPLEXITY: Low   GOALS: SHORT TERM GOALS: Target date: 09/09/21  Ind with an HEP. Goal status: MET   LONG TERM GOALS: Target date: 11/24/22  Eliminate right LE symptoms Goal status: IN PROGRESS  2.  Walk a community distance with pain not > 2-3/10. Goal status: IN PROGRESS  3.  Perform ADL's with pain not > 2-3/10. Goal status: IN PROGRESS  PLAN:  PT FREQUENCY: 2x/week  PT DURATION: 6 weeks  PLANNED INTERVENTIONS: Therapeutic exercises, Therapeutic activity, Patient/Family education, Self Care, Dry Needling, Electrical stimulation, Cryotherapy, Moist heat, Ultrasound, and Manual therapy.  PLAN FOR NEXT SESSION: Combo e'stim/US, STW/M, core exercise progression.   Ataya Murdy, [place], PT 09/23/2022, 11:32 AM

## 2022-09-26 ENCOUNTER — Ambulatory Visit: Payer: 59

## 2022-09-26 DIAGNOSIS — M5459 Other low back pain: Secondary | ICD-10-CM

## 2022-09-26 DIAGNOSIS — M62830 Muscle spasm of back: Secondary | ICD-10-CM | POA: Diagnosis not present

## 2022-09-26 NOTE — Therapy (Signed)
OUTPATIENT PHYSICAL THERAPY THORACOLUMBAR TREATMENT   Patient Name: Cynthia Mays MRN: [id number] DOB:1940/09/29, 82 y.o., female Today's Date: 09/26/2022  END OF SESSION:  PT End of Session - 09/26/22 1137     Visit Number 8    Number of Visits 12    Date for PT Re-Evaluation 11/24/22    Authorization Type FOTO AT LEAST EVERY 5TH VISIT.  PROGRESS NOTE AT 10TH VISIT.  KX MODIFIER AFTER 15 VISITS.    PT Start Time 1100    PT Stop Time 1151    PT Time Calculation (min) 51 min    Activity Tolerance Patient tolerated treatment well    Behavior During Therapy WFL for tasks assessed/performed              Past Medical History:  Diagnosis Date   Complication of anesthesia    Depression    Diabetes mellitus    Fibromyalgia    GERD (gastroesophageal reflux disease)    Hiatal hernia    Hyperlipidemia    Hypertension    Hypothyroidism    Hypothyroidism 07/23/2016   PONV (postoperative nausea and vomiting)    Past Surgical History:  Procedure Laterality Date   APPENDECTOMY  2002   arthroscopy  right 2002, left 2007   bilateral knees   BACK SURGERY  10/2007, 09/2008   x2   BIOPSY  10/02/2016   Procedure: BIOPSY;  Surgeon: Rogene Houston, MD;  [facility];  Service: Endoscopy;;  gastric   BIOPSY  05/04/2020   Procedure: BIOPSY;  Surgeon: Harvel Quale, MD;  [facility];  Service: Gastroenterology;;   [place]   bilateral   [place]   x2   COLONOSCOPY WITH PROPOFOL N/A 05/04/2020   Castaneda:The examined portion of the ileum was normal(focally active, nonspecific ileitis, neg for features of chronicity or granulomas)- A single (solitary) ulcer in the cecum.(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 tortuous colon, non bleeding internal hemorrhoids   ESOPHAGOGASTRODUODENOSCOPY N/A 10/02/2016    Procedure: ESOPHAGOGASTRODUODENOSCOPY (EGD);  Surgeon: Rogene Houston, MD;  [facility];  Service: Endoscopy;  Laterality: N/A;  11:15 - moved to 2/1 @ 3:00   ESOPHAGOGASTRODUODENOSCOPY (EGD) WITH PROPOFOL N/A 05/04/2020   Castaneda: normal esophagus, 4cm hh with four cameron ulcers, multiple gastric polyps-fundic gland polyps, erythematous mucosa in pylorus, gastric antral mucosa and oxyntic mucosa with mild chronic gastritis and reactive changes, neg h pylori, normal examined duodenum   KNEE SURGERY     both   Patient Active Problem List   Diagnosis Date Noted   Controlled substance agreement signed 08/15/2022   History of lumbar fusion 12/02/2021   Insomnia 05/14/2021   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[place]) 03/19/2021   Colonic ulcer 08/13/2020   Morbid obesity ([place]) 03/31/2019   Chronic use of benzodiazepine for therapeutic purpose 01/26/2018   Hiatal hernia 10/29/2017   Hyperlipidemia associated with type 2 diabetes mellitus ([place]) 12/31/2015   Coronary artery disease due to lipid rich plaque 12/31/2015   Left knee pain 10/24/2015   Macular degeneration, 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   Essential hypertension 02/20/2015   Chronic low back pain with sciatica 02/20/2015   Gastroesophageal reflux disease without esophagitis 11/20/2014   Anxiety, generalized 11/20/2014   Hypothyroidism 12/27/2013   DM (diabetes mellitus) ([place]) 12/27/2013   Precordial pain 01/15/2013    REFERRING PROVIDER: Evelina Dun  REFERRING DIAG: Chronic right-sided  low back pain with Sciatica.  Rationale for Evaluation and Treatment: Rehabilitation  THERAPY DIAG:  Muscle spasm of back  Other low back pain  ONSET DATE: ~2 months.  SUBJECTIVE:                                                                                                                                                                                           SUBJECTIVE STATEMENT: Patient reports that she is really hurting today. She  notes that it got so bad that it woke her up last night. She is unsure what caused her pain to get so bad.   PERTINENT HISTORY:  HTN, DM, adhesive allergy, two lumbar surgeries, Fibromyalgia.  PAIN:  Are you having pain? Yes "100"/10 radiating down RLE into the foot  PRECAUTIONS: None  WEIGHT BEARING RESTRICTIONS: No  FALLS:  Has patient fallen in last 6 months? No  LIVING ENVIRONMENT: Lives with: lives alone Lives in: House/apartment Stairs: 4 with railing. Has following equipment at home: None  OCCUPATION: Retired.  PLOF: Independent with basic ADLs  PATIENT GOALS: Not have pain into right buttock and LE.  NEXT MD VISIT:   OBJECTIVE:   DIAGNOSTIC FINDINGS:  X-RAY:  IMPRESSION: Lumbar spondylosis. No recent fracture is seen. There is surgical fusion from L2-L4 levels.  PATIENT SURVEYS:  FOTO Complete.  POSTURE: No Significant postural limitations, rounded shoulders, and flexed trunk   PALPATION: Very tender to palpation over right low back musculature and buttock region.  LUMBAR ROM:   Active lumbar flexion limited by 25% and active extension to 15 degrees.   LOWER EXTREMITY MMT:   Normal LE strength.  LUMBAR SPECIAL TESTS:  Equal leg lengths.  Pain reproduction with right SLR.  GAIT: Slow transition from sit to stand and a slow and purposeful gait pattern.  TODAY'S TREATMENT:                                                                                                                              DATE:       09/26/22 Treatment:  Manual Therapy Soft Tissue  Mobilization: right lumbar paraspinals, gluteals, piriformis, and hamstrings, for reduced pain and tone Joint Mobilizations: Lumbar CPA's, grade I   Modalities: no redness or adverse reaction to today's modalities  Date:  Unattended Estim: Lumbar, IFC @ 80-150 Hz w/ 40% scan, 15 mins, Pain and Tone    09/23/22 Modalities  Date:  [place] at 1.50 W/CM2 x 12 minutes f/b STW/M x 11 minutes to  bilateral lumbar region f/b HMP at 80-150 Hz on 40% scan x 20 minutes.  Patient tolerated treatment without complaint with normal modality response following removal of modality.   ASSESSMENT:  CLINICAL IMPRESSION: Patient presented treatment reporting a significant increase in her familiar low back and right lower extremity pain with no known cause. Treatment focused on manual therapy to her right low back and hip for reduced pain and tone. This along with electrical stimulation to her right lumbar musculature was able to completely resolve her pain as she was not hurting upon the conclusion of treatment. She continues to require skilled physical therapy to address her remaining impairments to maximize her functional mobility.   OBJECTIVE IMPAIRMENTS: Abnormal gait, decreased activity tolerance, decreased mobility, decreased ROM, increased muscle spasms, postural dysfunction, and pain.   ACTIVITY LIMITATIONS: locomotion level  PARTICIPATION LIMITATIONS: meal prep, cleaning, and laundry  PERSONAL FACTORS: 1 comorbidity: h/o low back pain and two surgeries  are also affecting patient's functional outcome.   REHAB POTENTIAL: Good  CLINICAL DECISION MAKING: Evolving/moderate complexity  EVALUATION COMPLEXITY: Low   GOALS: SHORT TERM GOALS: Target date: 09/09/21  Ind with an HEP. Goal status: MET   LONG TERM GOALS: Target date: 11/24/22  Eliminate right LE symptoms Goal status: IN PROGRESS  2.  Walk a community distance with pain not > 2-3/10. Goal status: IN PROGRESS  3.  Perform ADL's with pain not > 2-3/10. Goal status: IN PROGRESS  PLAN:  PT FREQUENCY: 2x/week  PT DURATION: 6 weeks  PLANNED INTERVENTIONS: Therapeutic exercises, Therapeutic activity, Patient/Family education, Self Care, Dry Needling, Electrical stimulation, Cryotherapy, Moist heat, Ultrasound, and Manual therapy.  PLAN FOR NEXT SESSION: Combo e'stim/US, STW/M, core exercise progression.   [place]anville Lewis, PT 09/26/2022, 12:05 PM

## 2022-09-29 ENCOUNTER — Ambulatory Visit: Payer: 59

## 2022-09-29 DIAGNOSIS — M5459 Other low back pain: Secondary | ICD-10-CM | POA: Diagnosis not present

## 2022-09-29 DIAGNOSIS — M62830 Muscle spasm of back: Secondary | ICD-10-CM | POA: Diagnosis not present

## 2022-09-29 NOTE — Therapy (Signed)
OUTPATIENT PHYSICAL THERAPY THORACOLUMBAR TREATMENT   Patient Name: Cynthia Mays MRN: [id number] DOB:[birth year], 82 y.o., female Today's Date: 09/29/2022  END OF SESSION:  PT End of Session - 09/29/22 1437     Visit Number 9    Number of Visits 12    Date for PT Re-Evaluation 11/24/22    Authorization Type FOTO AT LEAST EVERY 5TH VISIT.  PROGRESS NOTE AT 10TH VISIT.  KX MODIFIER AFTER 15 VISITS.    PT Start Time 1430    Activity Tolerance Patient tolerated treatment well    Behavior During Therapy [facility] for tasks assessed/performed              Past Medical History:  Diagnosis Date   Complication of anesthesia    Depression    Diabetes mellitus    Fibromyalgia    GERD (gastroesophageal reflux disease)    Hiatal hernia    Hyperlipidemia    Hypertension    Hypothyroidism    Hypothyroidism 07/23/2016   PONV (postoperative nausea and vomiting)    Past Surgical History:  Procedure Laterality Date   APPENDECTOMY  2002   arthroscopy  right 2002, left 2007   bilateral knees   BACK SURGERY  10/2007, 09/2008   x2   BIOPSY  10/02/2016   Procedure: BIOPSY;  Surgeon: Rogene Houston, MD;  [facility];  Service: Endoscopy;;  gastric   BIOPSY  05/04/2020   Procedure: BIOPSY;  Surgeon: Harvel Quale, MD;  [facility];  Service: Gastroenterology;;   [place]   bilateral   [place]   x2   COLONOSCOPY WITH PROPOFOL N/A 05/04/2020   Castaneda:The examined portion of the ileum was normal(focally active, nonspecific ileitis, neg for features of chronicity or granulomas)- A single (solitary) ulcer in the cecum.(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 tortuous colon, non bleeding internal hemorrhoids   ESOPHAGOGASTRODUODENOSCOPY N/A 10/02/2016   Procedure: ESOPHAGOGASTRODUODENOSCOPY (EGD);  Surgeon:  Rogene Houston, MD;  [facility];  Service: Endoscopy;  Laterality: N/A;  11:15 - moved to 2/1 @ 3:00   ESOPHAGOGASTRODUODENOSCOPY (EGD) WITH PROPOFOL N/A 05/04/2020   Castaneda: normal esophagus, 4cm hh with four cameron ulcers, multiple gastric polyps-fundic gland polyps, erythematous mucosa in pylorus, gastric antral mucosa and oxyntic mucosa with mild chronic gastritis and reactive changes, neg h pylori, normal examined duodenum   KNEE SURGERY     both   Patient Active Problem List   Diagnosis Date Noted   Controlled substance agreement signed 08/15/2022   History of lumbar fusion 12/02/2021   Insomnia 05/14/2021   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[place]) 03/19/2021   Colonic ulcer 08/13/2020   Morbid obesity ([place]) 03/31/2019   Chronic use of benzodiazepine for therapeutic purpose 01/26/2018   Hiatal hernia 10/29/2017   Hyperlipidemia associated with type 2 diabetes mellitus ([place]) 12/31/2015   Coronary artery disease due to lipid rich plaque 12/31/2015   Left knee pain 10/24/2015   Macular degeneration, 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   Essential hypertension 02/20/2015   Chronic low back pain with sciatica 02/20/2015   Gastroesophageal reflux disease without esophagitis 11/20/2014   Anxiety, generalized 11/20/2014   Hypothyroidism 12/27/2013   DM (diabetes mellitus) ([place]) 12/27/2013   Precordial pain 01/15/2013    REFERRING PROVIDER: Evelina Dun  REFERRING DIAG: Chronic right-sided low back pain with Sciatica.  Rationale for Evaluation and Treatment: Rehabilitation  THERAPY DIAG:  Muscle spasm of back  Other low back pain  ONSET DATE: ~2 months.  SUBJECTIVE:                                                                                                                                                                                           SUBJECTIVE STATEMENT: Patient reports that her back felt really good after last treatment until she went to the grocery store.     PERTINENT HISTORY:  HTN, DM, adhesive allergy, two lumbar surgeries, Fibromyalgia.  PAIN:  Are you having pain? Yes 8/10 radiating down RLE into the foot  PRECAUTIONS: None  WEIGHT BEARING RESTRICTIONS: No  FALLS:  Has patient fallen in last 6 months? No  LIVING ENVIRONMENT: Lives with: lives alone Lives in: House/apartment Stairs: 4 with railing. Has following equipment at home: None  OCCUPATION: Retired.  PLOF: Independent with basic ADLs  PATIENT GOALS: Not have pain into right buttock and LE.  NEXT MD VISIT:   OBJECTIVE:   DIAGNOSTIC FINDINGS:  X-RAY:  IMPRESSION: Lumbar spondylosis. No recent fracture is seen. There is surgical fusion from L2-L4 levels.  PATIENT SURVEYS:  FOTO Complete.  POSTURE: No Significant postural limitations, rounded shoulders, and flexed trunk   PALPATION: Very tender to palpation over right low back musculature and buttock region.  LUMBAR ROM:   Active lumbar flexion limited by 25% and active extension to 15 degrees.   LOWER EXTREMITY MMT:   Normal LE strength.  LUMBAR SPECIAL TESTS:  Equal leg lengths.  Pain reproduction with right SLR.  GAIT: Slow transition from sit to stand and a slow and purposeful gait pattern.  TODAY'S TREATMENT:                                                                                                                              DATE:       09/29/22 Treatment:   Manual Therapy Soft Tissue Mobilization: lumbar spine, STW/M to bil lumbar paraspinals, QL, and upper glutes to decrease pain and tone with pt in left side-lying for comfort and pillow between her knees  Modalities: no redness or adverse reaction to today's modalities  Date:  Unattended Estim: Lumbar, IFC @ 80-150 Hz w/ 40% scan, 15 mins, Pain and Tone Combo: Lumbar, 1.5 w/cm2, 10 mins, Pain    09/23/22 Modalities  Date:  [place] at 1.50 W/CM2 x 12 minutes f/b STW/M x 11 minutes to bilateral lumbar region f/b HMP at 80-150 Hz  on 40% scan x 20 minutes.  Patient tolerated treatment without complaint with normal modality response following removal of modality.   ASSESSMENT:  CLINICAL IMPRESSION: Pt arrives for today's treatment session reporting 8/10 low back pain. STW/M to bil lumbar paraspinals, QL, and upper glutes to decrease pain and tone.  Normal responses to all modalities noted upon completion. Pt reported decreased pain at completion of today's treatment session.  OBJECTIVE IMPAIRMENTS: Abnormal gait, decreased activity tolerance, decreased mobility, decreased ROM, increased muscle spasms, postural dysfunction, and pain.   ACTIVITY LIMITATIONS: locomotion level  PARTICIPATION LIMITATIONS: meal prep, cleaning, and laundry  PERSONAL FACTORS: 1 comorbidity: h/o low back pain and two surgeries  are also affecting patient's functional outcome.   REHAB POTENTIAL: Good  CLINICAL DECISION MAKING: Evolving/moderate complexity  EVALUATION COMPLEXITY: Low   GOALS: SHORT TERM GOALS: Target date: 09/09/21  Ind with an HEP. Goal status: MET   LONG TERM GOALS: Target date: 11/24/22  Eliminate right LE symptoms Goal status: IN PROGRESS  2.  Walk a community distance with pain not > 2-3/10. Goal status: IN PROGRESS  3.  Perform ADL's with pain not > 2-3/10. Goal status: IN PROGRESS  PLAN:  PT FREQUENCY: 2x/week  PT DURATION: 6 weeks  PLANNED INTERVENTIONS: Therapeutic exercises, Therapeutic activity, Patient/Family education, Self Care, Dry Needling, Electrical stimulation, Cryotherapy, Moist heat, Ultrasound, and Manual therapy.  PLAN FOR NEXT SESSION: Combo e'stim/US, STW/M, core exercise progression.   Newman Pies, PTA 09/29/2022, 3:33 PM

## 2022-10-02 ENCOUNTER — Ambulatory Visit: Payer: 59 | Attending: Family | Admitting: Physical Therapy

## 2022-10-02 ENCOUNTER — Encounter: Payer: Self-pay | Admitting: Physical Therapy

## 2022-10-02 DIAGNOSIS — M5459 Other low back pain: Secondary | ICD-10-CM | POA: Insufficient documentation

## 2022-10-02 DIAGNOSIS — M62830 Muscle spasm of back: Secondary | ICD-10-CM | POA: Diagnosis not present

## 2022-10-02 NOTE — Therapy (Addendum)
OUTPATIENT PHYSICAL THERAPY THORACOLUMBAR TREATMENT   Patient Name: Cynthia Mays MRN: [id number] DOB:10/04/1940, 82 y.o., female Today's Date: 10/02/2022  END OF SESSION:  PT End of Session - 10/02/22 1349     Visit Number 10    Number of Visits 12    Date for PT Re-Evaluation 11/24/22    Authorization Type FOTO AT LEAST EVERY 5TH VISIT.  PROGRESS NOTE AT 10TH VISIT.  KX MODIFIER AFTER 15 VISITS.    PT Start Time 7893    PT Stop Time 1434    PT Time Calculation (min) 45 min    Activity Tolerance Patient tolerated treatment well    Behavior During Therapy [facility] for tasks assessed/performed            Past Medical History:  Diagnosis Date   Complication of anesthesia    Depression    Diabetes mellitus    Fibromyalgia    GERD (gastroesophageal reflux disease)    Hiatal hernia    Hyperlipidemia    Hypertension    Hypothyroidism    Hypothyroidism 07/23/2016   PONV (postoperative nausea and vomiting)    Past Surgical History:  Procedure Laterality Date   APPENDECTOMY  2002   arthroscopy  right 2002, left 2007   bilateral knees   BACK SURGERY  10/2007, 09/2008   x2   BIOPSY  10/02/2016   Procedure: BIOPSY;  Surgeon: Rogene Houston, MD;  [facility];  Service: Endoscopy;;  gastric   BIOPSY  05/04/2020   Procedure: BIOPSY;  Surgeon: Harvel Quale, MD;  [facility];  Service: Gastroenterology;;   [place]   bilateral   [place]   x2   COLONOSCOPY WITH PROPOFOL N/A 05/04/2020   Castaneda:The examined portion of the ileum was normal(focally active, nonspecific ileitis, neg for features of chronicity or granulomas)- A single (solitary) ulcer in the cecum.(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 tortuous colon, non bleeding internal hemorrhoids   ESOPHAGOGASTRODUODENOSCOPY N/A 10/02/2016    Procedure: ESOPHAGOGASTRODUODENOSCOPY (EGD);  Surgeon: Rogene Houston, MD;  [facility];  Service: Endoscopy;  Laterality: N/A;  11:15 - moved to 2/1 @ 3:00   ESOPHAGOGASTRODUODENOSCOPY (EGD) WITH PROPOFOL N/A 05/04/2020   Castaneda: normal esophagus, 4cm hh with four cameron ulcers, multiple gastric polyps-fundic gland polyps, erythematous mucosa in pylorus, gastric antral mucosa and oxyntic mucosa with mild chronic gastritis and reactive changes, neg h pylori, normal examined duodenum   KNEE SURGERY     both   Patient Active Problem List   Diagnosis Date Noted   Controlled substance agreement signed 08/15/2022   History of lumbar fusion 12/02/2021   Insomnia 05/14/2021   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[place]) 03/19/2021   Colonic ulcer 08/13/2020   Morbid obesity ([place]) 03/31/2019   Chronic use of benzodiazepine for therapeutic purpose 01/26/2018   Hiatal hernia 10/29/2017   Hyperlipidemia associated with type 2 diabetes mellitus ([place]) 12/31/2015   Coronary artery disease due to lipid rich plaque 12/31/2015   Left knee pain 10/24/2015   Macular degeneration, 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   Essential hypertension 02/20/2015   Chronic low back pain with sciatica 02/20/2015   Gastroesophageal reflux disease without esophagitis 11/20/2014   Anxiety, generalized 11/20/2014   Hypothyroidism 12/27/2013   DM (diabetes mellitus) ([place]) 12/27/2013   Precordial pain 01/15/2013   REFERRING PROVIDER: Evelina Dun  REFERRING DIAG: Chronic right-sided low back pain  with Sciatica.  Rationale for Evaluation and Treatment: Rehabilitation  THERAPY DIAG:  Muscle spasm of back  Other low back pain  ONSET DATE: ~2 months.  SUBJECTIVE:                                                                                                                                                                                           SUBJECTIVE STATEMENT: Was good until she went to the grocery store yesterday and  turned to put buggy back and felt a shooting pain down RLE. Sees Dr. Arnoldo Morale next week. Tired of hurting.  PERTINENT HISTORY:  HTN, DM, adhesive allergy, two lumbar surgeries, Fibromyalgia.  PAIN:  Are you having pain? Yes 10+/10 radiating down RLE into the foot  PRECAUTIONS: None  WEIGHT BEARING RESTRICTIONS: No  FALLS:  Has patient fallen in last 6 months? No  LIVING ENVIRONMENT: Lives with: lives alone Lives in: House/apartment Stairs: 4 with railing. Has following equipment at home: None  OCCUPATION: Retired.  PLOF: Independent with basic ADLs  PATIENT GOALS: Not have pain into right buttock and LE.  NEXT MD VISIT:   OBJECTIVE:   DIAGNOSTIC FINDINGS:  X-RAY:  IMPRESSION: Lumbar spondylosis. No recent fracture is seen. There is surgical fusion from L2-L4 levels.  PATIENT SURVEYS:  FOTO Complete.  POSTURE: No Significant postural limitations, rounded shoulders, and flexed trunk   PALPATION: Very tender to palpation over right low back musculature and buttock region.  LUMBAR ROM:   Active lumbar flexion limited by 25% and active extension to 15 degrees.   LOWER EXTREMITY MMT:   Normal LE strength.  LUMBAR SPECIAL TESTS:  Equal leg lengths.  Pain reproduction with right SLR.  GAIT: Slow transition from sit to stand and a slow and purposeful gait pattern.  TODAY'S TREATMENT:                                                                                                                              DATE:      Modalities  Date:  Unattended Estim: Lumbar, IFC, 15 mins, Pain Combo: Lumbar, 1.5  w/cm2, 100%, 1 mhz, 10 mins, Pain  Manual Therapy Soft Tissue Mobilization: B lumbar paraspinals/ QL, reduce pain and mild tightness    ASSESSMENT:  CLINICAL IMPRESSION: Patient presented in clinic in 10+/10 LBP with radicular pain down RLE since going to grocery store and turning to return her cart in the parking lot and felt a shooting pain. Patient states  that prior to the grocery store trip she had no pain and had done well with ADLs. Patient very guarded and tense with transfers and bed mobility due to pain today. Mild tightness palpable in B low back upon palpation. Normal modalities response noted following removal of the modalities. Patient reported feeling some better upon end of treatment.  OBJECTIVE IMPAIRMENTS: Abnormal gait, decreased activity tolerance, decreased mobility, decreased ROM, increased muscle spasms, postural dysfunction, and pain.   ACTIVITY LIMITATIONS: locomotion level  PARTICIPATION LIMITATIONS: meal prep, cleaning, and laundry  PERSONAL FACTORS: 1 comorbidity: h/o low back pain and two surgeries  are also affecting patient's functional outcome.   REHAB POTENTIAL: Good  CLINICAL DECISION MAKING: Evolving/moderate complexity  EVALUATION COMPLEXITY: Low   GOALS: SHORT TERM GOALS: Target date: 09/09/21  Ind with an HEP. Goal status: MET   LONG TERM GOALS: Target date: 11/24/22  Eliminate right LE symptoms Goal status: IN PROGRESS  2.  Walk a community distance with pain not > 2-3/10. Goal status: IN PROGRESS  3.  Perform ADL's with pain not > 2-3/10. Goal status: IN PROGRESS  PLAN:  PT FREQUENCY: 2x/week  PT DURATION: 6 weeks  PLANNED INTERVENTIONS: Therapeutic exercises, Therapeutic activity, Patient/Family education, Self Care, Dry Needling, Electrical stimulation, Cryotherapy, Moist heat, Ultrasound, and Manual therapy.  PLAN FOR NEXT SESSION: Combo e'stim/US, STW/M, core exercise progression.   Standley Brooking, PTA 10/02/2022, 2:40 PM   Progress Note Reporting Period 08/27/23 to 10/02/22.  See note below for Objective Data and Assessment of Progress/Goals. Patient with continued pain and high today due to the aforementioned (see clinical impression statement).    Mali Applegate MPT

## 2022-10-06 ENCOUNTER — Ambulatory Visit: Payer: 59

## 2022-10-06 ENCOUNTER — Other Ambulatory Visit: Payer: Self-pay | Admitting: Family

## 2022-10-06 DIAGNOSIS — M62830 Muscle spasm of back: Secondary | ICD-10-CM

## 2022-10-06 DIAGNOSIS — E1169 Type 2 diabetes mellitus with other specified complication: Secondary | ICD-10-CM

## 2022-10-06 DIAGNOSIS — M5459 Other low back pain: Secondary | ICD-10-CM | POA: Diagnosis not present

## 2022-10-06 NOTE — Therapy (Signed)
OUTPATIENT PHYSICAL THERAPY THORACOLUMBAR TREATMENT   Patient Name: Cynthia Mays MRN: [id number] DOB:12/09/1940, 82 y.o., female Today's Date: 10/06/2022  END OF SESSION:  PT End of Session - 10/06/22 1347     Visit Number 11    Number of Visits 12    Date for PT Re-Evaluation 11/24/22    Authorization Type FOTO AT LEAST EVERY 5TH VISIT.  PROGRESS NOTE AT 10TH VISIT.  KX MODIFIER AFTER 15 VISITS.    PT Start Time 5956    PT Stop Time 1425    PT Time Calculation (min) 40 min    Activity Tolerance Patient limited by pain    Behavior During Therapy [facility] for tasks assessed/performed            Past Medical History:  Diagnosis Date   Complication of anesthesia    Depression    Diabetes mellitus    Fibromyalgia    GERD (gastroesophageal reflux disease)    Hiatal hernia    Hyperlipidemia    Hypertension    Hypothyroidism    Hypothyroidism 07/23/2016   PONV (postoperative nausea and vomiting)    Past Surgical History:  Procedure Laterality Date   APPENDECTOMY  2002   arthroscopy  right 2002, left 2007   bilateral knees   BACK SURGERY  10/2007, 09/2008   x2   BIOPSY  10/02/2016   Procedure: BIOPSY;  Surgeon: Rogene Houston, MD;  [facility];  Service: Endoscopy;;  gastric   BIOPSY  05/04/2020   Procedure: BIOPSY;  Surgeon: Harvel Quale, MD;  [facility];  Service: Gastroenterology;;   [place]   bilateral   [place]   x2   COLONOSCOPY WITH PROPOFOL N/A 05/04/2020   Castaneda:The examined portion of the ileum was normal(focally active, nonspecific ileitis, neg for features of chronicity or granulomas)- A single (solitary) ulcer in the cecum.(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 tortuous colon, non bleeding internal hemorrhoids   ESOPHAGOGASTRODUODENOSCOPY N/A 10/02/2016    Procedure: ESOPHAGOGASTRODUODENOSCOPY (EGD);  Surgeon: Rogene Houston, MD;  [facility];  Service: Endoscopy;  Laterality: N/A;  11:15 - moved to 2/1 @ 3:00   ESOPHAGOGASTRODUODENOSCOPY (EGD) WITH PROPOFOL N/A 05/04/2020   Castaneda: normal esophagus, 4cm hh with four cameron ulcers, multiple gastric polyps-fundic gland polyps, erythematous mucosa in pylorus, gastric antral mucosa and oxyntic mucosa with mild chronic gastritis and reactive changes, neg h pylori, normal examined duodenum   KNEE SURGERY     both   Patient Active Problem List   Diagnosis Date Noted   Controlled substance agreement signed 08/15/2022   History of lumbar fusion 12/02/2021   Insomnia 05/14/2021   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[place]) 03/19/2021   Colonic ulcer 08/13/2020   Morbid obesity ([place]) 03/31/2019   Chronic use of benzodiazepine for therapeutic purpose 01/26/2018   Hiatal hernia 10/29/2017   Hyperlipidemia associated with type 2 diabetes mellitus ([place]) 12/31/2015   Coronary artery disease due to lipid rich plaque 12/31/2015   Left knee pain 10/24/2015   Macular degeneration, 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   Essential hypertension 02/20/2015   Chronic low back pain with sciatica 02/20/2015   Gastroesophageal reflux disease without esophagitis 11/20/2014   Anxiety, generalized 11/20/2014   Hypothyroidism 12/27/2013   DM (diabetes mellitus) ([place]) 12/27/2013   Precordial pain 01/15/2013   REFERRING PROVIDER: Evelina Dun  REFERRING DIAG: Chronic right-sided low back pain  with Sciatica.  Rationale for Evaluation and Treatment: Rehabilitation  THERAPY DIAG:  Muscle spasm of back  Other low back pain  ONSET DATE: ~2 months.  SUBJECTIVE:                                                                                                                                                                                           SUBJECTIVE STATEMENT: Patient reports that her back is really hurting today. She  notes that she feels a little better after therapy, but it does not last long.   PERTINENT HISTORY:  HTN, DM, adhesive allergy, two lumbar surgeries, Fibromyalgia.  PAIN:  Are you having pain? Yes 50/10 radiating down RLE into the foot  PRECAUTIONS: None  WEIGHT BEARING RESTRICTIONS: No  FALLS:  Has patient fallen in last 6 months? No  LIVING ENVIRONMENT: Lives with: lives alone Lives in: House/apartment Stairs: 4 with railing. Has following equipment at home: None  OCCUPATION: Retired.  PLOF: Independent with basic ADLs  PATIENT GOALS: Not have pain into right buttock and LE.  NEXT MD VISIT: 10/07/22  OBJECTIVE:   DIAGNOSTIC FINDINGS:  X-RAY:  IMPRESSION: Lumbar spondylosis. No recent fracture is seen. There is surgical fusion from L2-L4 levels.  PATIENT SURVEYS:  FOTO Complete.  POSTURE: No Significant postural limitations, rounded shoulders, and flexed trunk   PALPATION: Very tender to palpation over right low back musculature and buttock region.  LUMBAR ROM:   Active lumbar flexion limited by 25% and active extension to 15 degrees.   LOWER EXTREMITY MMT:   Normal LE strength.  LUMBAR SPECIAL TESTS:  Equal leg lengths.  Pain reproduction with right SLR.  GAIT: Slow transition from sit to stand and a slow and purposeful gait pattern.  TODAY'S TREATMENT:                                                                                                                              DATE:     Manual Therapy Soft Tissue Mobilization: lumbar paraspinals, light pressure for reduced pain and tone   Modalities: no redness or  adverse reaction to today's modalities  Date:  Unattended Estim: Lumbar, IFC @ 80-150 Hz w/ 40% scan, 15 mins, Pain and Tone  ASSESSMENT:  CLINICAL IMPRESSION: Patient presented to treatment with high pain severity and irritability which limited her ability to complete new or familiar interventions. Manual therapy was attempted, but this  limited to light soft tissue mobilization to her lumbar paraspinals due to increased sensitivity to touch. Electrical stimulation to her lumbar spine was the most effective at reducing her familiar pain. She reported that her back felt a little better upon the conclusion of treatment. She may benefit from additional medical intervention due to her high pain severity and irritability.   OBJECTIVE IMPAIRMENTS: Abnormal gait, decreased activity tolerance, decreased mobility, decreased ROM, increased muscle spasms, postural dysfunction, and pain.   ACTIVITY LIMITATIONS: locomotion level  PARTICIPATION LIMITATIONS: meal prep, cleaning, and laundry  PERSONAL FACTORS: 1 comorbidity: h/o low back pain and two surgeries  are also affecting patient's functional outcome.   REHAB POTENTIAL: Good  CLINICAL DECISION MAKING: Evolving/moderate complexity  EVALUATION COMPLEXITY: Low   GOALS: SHORT TERM GOALS: Target date: 09/09/21  Ind with an HEP. Goal status: MET   LONG TERM GOALS: Target date: 11/24/22  Eliminate right LE symptoms Goal status: IN PROGRESS  2.  Walk a community distance with pain not > 2-3/10. Goal status: IN PROGRESS  3.  Perform ADL's with pain not > 2-3/10. Goal status: IN PROGRESS  PLAN:  PT FREQUENCY: 2x/week  PT DURATION: 6 weeks  PLANNED INTERVENTIONS: Therapeutic exercises, Therapeutic activity, Patient/Family education, Self Care, Dry Needling, Electrical stimulation, Cryotherapy, Moist heat, Ultrasound, and Manual therapy.  PLAN FOR NEXT SESSION: Combo e'stim/US, STW/M, core exercise progression.   Darlin Coco, PT 10/06/2022, 3:29 PM

## 2022-10-07 DIAGNOSIS — M47816 Spondylosis without myelopathy or radiculopathy, lumbar region: Secondary | ICD-10-CM | POA: Diagnosis not present

## 2022-10-07 DIAGNOSIS — M4316 Spondylolisthesis, lumbar region: Secondary | ICD-10-CM | POA: Diagnosis not present

## 2022-10-07 DIAGNOSIS — M79604 Pain in right leg: Secondary | ICD-10-CM | POA: Diagnosis not present

## 2022-10-09 ENCOUNTER — Ambulatory Visit: Payer: 59 | Admitting: Physical Therapy

## 2022-10-09 DIAGNOSIS — M5459 Other low back pain: Secondary | ICD-10-CM | POA: Diagnosis not present

## 2022-10-09 DIAGNOSIS — M62830 Muscle spasm of back: Secondary | ICD-10-CM | POA: Diagnosis not present

## 2022-10-09 NOTE — Therapy (Signed)
OUTPATIENT PHYSICAL THERAPY THORACOLUMBAR TREATMENT   Patient Name: Cynthia Mays MRN: [id number] DOB:02-12-1941, 82 y.o., female Today's Date: 10/09/2022  END OF SESSION:  PT End of Session - 10/09/22 1410     Visit Number 12    Number of Visits 12    Date for PT Re-Evaluation 11/24/22    Authorization Type FOTO AT LEAST EVERY 5TH VISIT.  PROGRESS NOTE AT 10TH VISIT.  KX MODIFIER AFTER 15 VISITS.    PT Start Time 0123            Past Medical History:  Diagnosis Date   Complication of anesthesia    Depression    Diabetes mellitus    Fibromyalgia    GERD (gastroesophageal reflux disease)    Hiatal hernia    Hyperlipidemia    Hypertension    Hypothyroidism    Hypothyroidism 07/23/2016   PONV (postoperative nausea and vomiting)    Past Surgical History:  Procedure Laterality Date   APPENDECTOMY  2002   arthroscopy  right 2002, left 2007   bilateral knees   BACK SURGERY  10/2007, 09/2008   x2   BIOPSY  10/02/2016   Procedure: BIOPSY;  Surgeon: Rogene Houston, MD;  [facility];  Service: Endoscopy;;  gastric   BIOPSY  05/04/2020   Procedure: BIOPSY;  Surgeon: Harvel Quale, MD;  Location: [facility];  Service: Gastroenterology;;   [place]   bilateral   [place]   x2   COLONOSCOPY WITH PROPOFOL N/A 05/04/2020   [place]:The examined portion of the ileum was normal(focally active, nonspecific ileitis, neg for features of chronicity or granulomas)- A single (solitary) ulcer in the cecum.(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 tortuous colon, non bleeding internal hemorrhoids   ESOPHAGOGASTRODUODENOSCOPY N/A 10/02/2016   Procedure: ESOPHAGOGASTRODUODENOSCOPY (EGD);  Surgeon: Rogene Houston, MD;  [facility];  Service: Endoscopy;  Laterality: N/A;  11:15 - moved to 2/1 @ 3:00    ESOPHAGOGASTRODUODENOSCOPY (EGD) WITH PROPOFOL N/A 05/04/2020   Castaneda: normal esophagus, 4cm hh with four cameron ulcers, multiple gastric polyps-fundic gland polyps, erythematous mucosa in pylorus, gastric antral mucosa and oxyntic mucosa with mild chronic gastritis and reactive changes, neg h pylori, normal examined duodenum   KNEE SURGERY     both   Patient Active Problem List   Diagnosis Date Noted   Controlled substance agreement signed 08/15/2022   History of lumbar fusion 12/02/2021   Insomnia 05/14/2021   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[place]) 03/19/2021   Colonic ulcer 08/13/2020   Morbid obesity ([place]) 03/31/2019   Chronic use of benzodiazepine for therapeutic purpose 01/26/2018   Hiatal hernia 10/29/2017   Hyperlipidemia associated with type 2 diabetes mellitus ([place]) 12/31/2015   Coronary artery disease due to lipid rich plaque 12/31/2015   Left knee pain 10/24/2015   Macular degeneration, 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   Essential hypertension 02/20/2015   Chronic low back pain with sciatica 02/20/2015   Gastroesophageal reflux disease without esophagitis 11/20/2014   Anxiety, generalized 11/20/2014   Hypothyroidism 12/27/2013   DM (diabetes mellitus) ([place]) 12/27/2013   Precordial pain 01/15/2013   REFERRING PROVIDER: Evelina Dun  REFERRING DIAG: Chronic right-sided low back pain with Sciatica.  Rationale for Evaluation and Treatment: Rehabilitation  THERAPY DIAG:  Muscle spasm of back  Other low back pain  ONSET DATE: ~2 months.  SUBJECTIVE:  SUBJECTIVE STATEMENT:Back hurting badly.  Getting further testing.  PERTINENT HISTORY:  HTN, DM, adhesive allergy, two lumbar surgeries, Fibromyalgia.  PAIN:  Are you having pain? Yes 9-10/10 radiating down RLE into the foot  PRECAUTIONS:  None  WEIGHT BEARING RESTRICTIONS: No  FALLS:  Has patient fallen in last 6 months? No  LIVING ENVIRONMENT: Lives with: lives alone Lives in: House/apartment Stairs: 4 with railing. Has following equipment at home: None  OCCUPATION: Retired.  PLOF: Independent with basic ADLs  PATIENT GOALS: Not have pain into right buttock and LE.  NEXT MD VISIT: 10/07/22  OBJECTIVE:   DIAGNOSTIC FINDINGS:  X-RAY:  IMPRESSION: Lumbar spondylosis. No recent fracture is seen. There is surgical fusion from L2-L4 levels.  PATIENT SURVEYS:  FOTO Complete.  POSTURE: No Significant postural limitations, rounded shoulders, and flexed trunk   PALPATION: Very tender to palpation over right low back musculature and buttock region.  LUMBAR ROM:   Active lumbar flexion limited by 25% and active extension to 15 degrees.   LOWER EXTREMITY MMT:   Normal LE strength.  LUMBAR SPECIAL TESTS:  Equal leg lengths.  Pain reproduction with right SLR.  GAIT: Slow transition from sit to stand and a slow and purposeful gait pattern.  TODAY'S TREATMENT:                                                                                                                              DATE:     Manual Therapy In left sdly position with folded pillow between knees for comfort: [place] at 1.50 W/CM2 x 12 minutes to patient's right lb/gluteal region f/b STW/M x 11 minutes f/b HMP x 10 minutes.   Normal modality response following removal of modality.      ASSESSMENT:  CLINICAL IMPRESSION: Patient completed all her PT visits.  Goals were not met.  She states she will be returning to her MD for further testing and hopes to get an MRI. OBJECTIVE IMPAIRMENTS: Abnormal gait, decreased activity tolerance, decreased mobility, decreased ROM, increased muscle spasms, postural dysfunction, and pain.   ACTIVITY LIMITATIONS: locomotion level  PARTICIPATION LIMITATIONS: meal prep, cleaning, and laundry  PERSONAL FACTORS: 1  comorbidity: h/o low back pain and two surgeries  are also affecting patient's functional outcome.   REHAB POTENTIAL: Good  CLINICAL DECISION MAKING: Evolving/moderate complexity  EVALUATION COMPLEXITY: Low   GOALS: SHORT TERM GOALS: Target date: 09/09/21  Ind with an HEP. Goal status: MET   LONG TERM GOALS: Target date: 11/24/22  Eliminate right LE symptoms Goal status: IN PROGRESS  2.  Walk a community distance with pain not > 2-3/10. Goal status: IN PROGRESS  3.  Perform ADL's with pain not > 2-3/10. Goal status: IN PROGRESS  PLAN:  PT FREQUENCY: 2x/week  PT DURATION: 6 weeks  PLANNED INTERVENTIONS: Therapeutic exercises, Therapeutic activity, Patient/Family education, Self Care, Dry Needling, Electrical stimulation, Cryotherapy, Moist heat, Ultrasound, and Manual therapy.  PLAN FOR NEXT SESSION: Combo e'stim/US, STW/M,  core exercise progression.  PHYSICAL THERAPY DISCHARGE SUMMARY  Visits from Start of Care: 12.  Current functional level related to goals / functional outcomes: See above.   Remaining deficits: LTG's not met.   Education / Equipment: HEP.   Patient agrees to discharge. Patient goals were not met. Patient is being discharged due to lack of progress.    Floreine Kingdon, [place], PT 10/09/2022, 2:16 PM

## 2022-10-10 ENCOUNTER — Other Ambulatory Visit (HOSPITAL_COMMUNITY): Payer: Self-pay | Admitting: Registered Nurse

## 2022-10-10 DIAGNOSIS — M4316 Spondylolisthesis, lumbar region: Secondary | ICD-10-CM

## 2022-10-30 ENCOUNTER — Encounter: Payer: Self-pay | Admitting: Radiology

## 2022-11-03 DIAGNOSIS — G5731 Lesion of lateral popliteal nerve, right lower limb: Secondary | ICD-10-CM | POA: Diagnosis not present

## 2022-11-04 ENCOUNTER — Ambulatory Visit (HOSPITAL_COMMUNITY)
Admission: RE | Admit: 2022-11-04 | Discharge: 2022-11-04 | Disposition: A | Discharge status: Home / Self Care | Payer: 59 | Source: Ambulatory Visit | Attending: Registered Nurse | Admitting: Registered Nurse

## 2022-11-04 DIAGNOSIS — M545 Low back pain, unspecified: Secondary | ICD-10-CM | POA: Diagnosis not present

## 2022-11-04 DIAGNOSIS — M4316 Spondylolisthesis, lumbar region: Secondary | ICD-10-CM | POA: Diagnosis not present

## 2022-11-04 LAB — HM DIABETES EYE EXAM

## 2022-11-17 ENCOUNTER — Ambulatory Visit: Payer: 59 | Admitting: Family

## 2022-11-21 ENCOUNTER — Encounter: Payer: Self-pay | Admitting: Family

## 2022-11-21 ENCOUNTER — Ambulatory Visit (INDEPENDENT_AMBULATORY_CARE_PROVIDER_SITE_OTHER): Payer: 59 | Admitting: Family

## 2022-11-21 VITALS — BP 131/73 | HR 62 | Temp 97.0°F | Ht 59.0 in | Wt 179.0 lb

## 2022-11-21 DIAGNOSIS — E785 Hyperlipidemia, unspecified: Secondary | ICD-10-CM

## 2022-11-21 DIAGNOSIS — Z79899 Other long term (current) drug therapy: Secondary | ICD-10-CM | POA: Diagnosis not present

## 2022-11-21 DIAGNOSIS — F411 Generalized anxiety disorder: Secondary | ICD-10-CM

## 2022-11-21 DIAGNOSIS — E039 Hypothyroidism, unspecified: Secondary | ICD-10-CM

## 2022-11-21 DIAGNOSIS — K529 Noninfective gastroenteritis and colitis, unspecified: Secondary | ICD-10-CM

## 2022-11-21 DIAGNOSIS — K633 Ulcer of intestine: Secondary | ICD-10-CM | POA: Diagnosis not present

## 2022-11-21 DIAGNOSIS — G4700 Insomnia, unspecified: Secondary | ICD-10-CM | POA: Diagnosis not present

## 2022-11-21 DIAGNOSIS — M5440 Lumbago with sciatica, unspecified side: Secondary | ICD-10-CM | POA: Diagnosis not present

## 2022-11-21 DIAGNOSIS — I2510 Atherosclerotic heart disease of native coronary artery without angina pectoris: Secondary | ICD-10-CM | POA: Diagnosis not present

## 2022-11-21 DIAGNOSIS — R1030 Lower abdominal pain, unspecified: Secondary | ICD-10-CM

## 2022-11-21 DIAGNOSIS — I2583 Coronary atherosclerosis due to lipid rich plaque: Secondary | ICD-10-CM

## 2022-11-21 DIAGNOSIS — G8929 Other chronic pain: Secondary | ICD-10-CM | POA: Diagnosis not present

## 2022-11-21 DIAGNOSIS — E1169 Type 2 diabetes mellitus with other specified complication: Secondary | ICD-10-CM | POA: Diagnosis not present

## 2022-11-21 DIAGNOSIS — I10 Essential (primary) hypertension: Secondary | ICD-10-CM

## 2022-11-21 LAB — BAYER DCA HB A1C WAIVED: HB A1C (BAYER DCA - WAIVED): 6.2 % — ABNORMAL HIGH (ref 4.8–5.6)

## 2022-11-21 MED ORDER — MESALAMINE 1.2 G PO TBEC: DELAYED_RELEASE_TABLET | ORAL | 1 refills | Status: DC

## 2022-11-21 MED ORDER — DIAZEPAM 5 MG PO TABS: 5.0000 mg | ORAL_TABLET | Freq: Four times a day (QID) | ORAL | 2 refills | Status: DC | PRN

## 2022-11-21 MED ORDER — HYDROCODONE-ACETAMINOPHEN 5-325 MG PO TABS: 1.0000 | ORAL_TABLET | Freq: Four times a day (QID) | ORAL | 0 refills | Status: DC | PRN

## 2022-11-21 MED ORDER — TRAZODONE HCL 50 MG PO TABS: 50.0000 mg | ORAL_TABLET | Freq: Every day | ORAL | 2 refills | Status: DC

## 2022-11-21 MED ORDER — LEVOTHYROXINE SODIUM 112 MCG PO TABS: 112.0000 ug | ORAL_TABLET | Freq: Every day | ORAL | 1 refills | Status: DC

## 2022-11-21 MED ORDER — ROSUVASTATIN CALCIUM 5 MG PO TABS: 5.0000 mg | ORAL_TABLET | Freq: Every day | ORAL | 3 refills | Status: DC

## 2022-11-21 NOTE — Patient Instructions (Signed)
Health Maintenance After Age 81 After age 81, you are at a higher risk for certain long-term diseases and infections as well as injuries from falls. Falls are a major cause of broken bones and head injuries in people who are older than age 81. Getting regular preventive care can help to keep you healthy and well. Preventive care includes getting regular testing and making lifestyle changes as recommended by your health care provider. Talk with your health care provider about: Which screenings and tests you should have. A screening is a test that checks for a disease when you have no symptoms. A diet and exercise plan that is right for you. What should I know about screenings and tests to prevent falls? Screening and testing are the best ways to find a health problem early. Early diagnosis and treatment give you the best chance of managing medical conditions that are common after age 81. Certain conditions and lifestyle choices may make you more likely to have a fall. Your health care provider may recommend: Regular vision checks. Poor vision and conditions such as cataracts can make you more likely to have a fall. If you wear glasses, make sure to get your prescription updated if your vision changes. Medicine review. Work with your health care provider to regularly review all of the medicines you are taking, including over-the-counter medicines. Ask your health care provider about any side effects that may make you more likely to have a fall. Tell your health care provider if any medicines that you take make you feel dizzy or sleepy. Strength and balance checks. Your health care provider may recommend certain tests to check your strength and balance while standing, walking, or changing positions. Foot health exam. Foot pain and numbness, as well as not wearing proper footwear, can make you more likely to have a fall. Screenings, including: Osteoporosis screening. Osteoporosis is a condition that causes  the bones to get weaker and break more easily. Blood pressure screening. Blood pressure changes and medicines to control blood pressure can make you feel dizzy. Depression screening. You may be more likely to have a fall if you have a fear of falling, feel depressed, or feel unable to do activities that you used to do. Alcohol use screening. Using too much alcohol can affect your balance and may make you more likely to have a fall. Follow these instructions at home: Lifestyle Do not drink alcohol if: Your health care provider tells you not to drink. If you drink alcohol: Limit how much you have to: 0-1 drink a day for women. 0-2 drinks a day for men. Know how much alcohol is in your drink. In the [place] one drink equals one 12 oz bottle of beer (355 mL), one 5 oz glass of wine (148 mL), or one 1 oz glass of hard liquor (44 mL). Do not use any products that contain nicotine or tobacco. These products include cigarettes, chewing tobacco, and vaping devices, such as e-cigarettes. If you need help quitting, ask your health care provider. Activity  Follow a regular exercise program to stay fit. This will help you maintain your balance. Ask your health care provider what types of exercise are appropriate for you. If you need a cane or walker, use it as recommended by your health care provider. Wear supportive shoes that have nonskid soles. Safety  Remove any tripping hazards, such as rugs, cords, and clutter. Install safety equipment such as grab bars in bathrooms and safety rails on stairs. Keep rooms and walkways   well-lit. General instructions Talk with your health care provider about your risks for falling. Tell your health care provider if: You fall. Be sure to tell your health care provider about all falls, even ones that seem minor. You feel dizzy, tiredness (fatigue), or off-balance. Take over-the-counter and prescription medicines only as told by your health care provider. These include  supplements. Eat a healthy diet and maintain a healthy weight. A healthy diet includes low-fat dairy products, low-fat (lean) meats, and fiber from whole grains, beans, and lots of fruits and vegetables. Stay current with your vaccines. Schedule regular health, dental, and eye exams. Summary Having a healthy lifestyle and getting preventive care can help to protect your health and wellness after age 81. Screening and testing are the best way to find a health problem early and help you avoid having a fall. Early diagnosis and treatment give you the best chance for managing medical conditions that are more common for people who are older than age 81. Falls are a major cause of broken bones and head injuries in people who are older than age 81. Take precautions to prevent a fall at home. Work with your health care provider to learn what changes you can make to improve your health and wellness and to prevent falls. This information is not intended to replace advice given to you by your health care provider. Make sure you discuss any questions you have with your health care provider. Document Revised: 01/07/2021 Document Reviewed: 01/07/2021 Elsevier Patient Education  2023 Elsevier Inc.  

## 2022-11-21 NOTE — Progress Notes (Signed)
Subjective:    Patient ID: Cynthia Mays, female    DOB: 1941/03/11, 82 y.o.   MRN: [id number]  Chief Complaint  Patient presents with   Medical Management of Chronic Issues   Fatigue    Been going on for months goes to bed tired gets up tired    PT presents to the office today for chronic follow up. She is morbid obese with a BMI of 36 and DM and HTN.    She has CAD and taking Crestor daily.  Hypertension This is a chronic problem. The current episode started more than 1 year ago. The problem has been resolved since onset. The problem is controlled. Associated symptoms include anxiety and malaise/fatigue. Pertinent negatives include no blurred vision, peripheral edema or shortness of breath. Risk factors for coronary artery disease include dyslipidemia, obesity and sedentary lifestyle. The current treatment provides moderate improvement. Identifiable causes of hypertension include a thyroid problem.  Gastroesophageal Reflux She complains of belching and heartburn. This is a chronic problem. The current episode started more than 1 year ago. The problem occurs occasionally. The problem has been resolved. Associated symptoms include fatigue. She has tried a PPI for the symptoms. The treatment provided moderate relief.  Thyroid Problem Presents for follow-up visit. Symptoms include fatigue. Patient reports no anxiety, constipation or diarrhea. The symptoms have been stable. Her past medical history is significant for hyperlipidemia.  Hyperlipidemia This is a chronic problem. The current episode started more than 1 year ago. The problem is uncontrolled. Exacerbating diseases include obesity. Pertinent negatives include no shortness of breath. Current antihyperlipidemic treatment includes statins. The current treatment provides moderate improvement of lipids. Risk factors for coronary artery disease include diabetes mellitus, dyslipidemia, hypertension, a sedentary lifestyle and  post-menopausal.  Diabetes She presents for her follow-up diabetic visit. She has type 2 diabetes mellitus. Pertinent negatives for hypoglycemia include no nervousness/anxiousness. Associated symptoms include fatigue. Pertinent negatives for diabetes include no blurred vision. Symptoms are stable. Risk factors for coronary artery disease include dyslipidemia, diabetes mellitus, hypertension, post-menopausal and sedentary lifestyle. She is following a generally healthy diet. (Does not check BS ) Eye exam is current.  Insomnia Primary symptoms: difficulty falling asleep, frequent awakening, malaise/fatigue.   The current episode started more than one year.  Anxiety Symptoms include insomnia. Patient reports no nervous/anxious behavior or shortness of breath.        Review of Systems  Constitutional:  Positive for fatigue and malaise/fatigue.  Eyes:  Negative for blurred vision.  Respiratory:  Negative for shortness of breath.   Gastrointestinal:  Positive for heartburn. Negative for constipation and diarrhea.  Psychiatric/Behavioral:  The patient has insomnia. The patient is not nervous/anxious.   All other systems reviewed and are negative.      Objective:   Physical Exam Vitals reviewed.  Constitutional:      General: She is not in acute distress.    Appearance: She is well-developed. She is obese.  HENT:     Head: Normocephalic and atraumatic.     Right Ear: Tympanic membrane normal.     Left Ear: Tympanic membrane normal.  Eyes:     Pupils: Pupils are equal, round, and reactive to light.  Neck:     Thyroid: No thyromegaly.  Cardiovascular:     Rate and Rhythm: Normal rate and regular rhythm.     Heart sounds: Normal heart sounds. No murmur heard. Pulmonary:     Effort: Pulmonary effort is normal. No respiratory distress.  Breath sounds: Normal breath sounds. No wheezing.  Abdominal:     General: Bowel sounds are normal. There is no distension.     Palpations:  Abdomen is soft.     Tenderness: There is no abdominal tenderness.  Musculoskeletal:        General: No tenderness. Normal range of motion.     Cervical back: Normal range of motion and neck supple.  Skin:    General: Skin is warm and dry.  Neurological:     Mental Status: She is alert and oriented to person, place, and time.     Cranial Nerves: No cranial nerve deficit.     Deep Tendon Reflexes: Reflexes are normal and symmetric.  Psychiatric:        Behavior: Behavior normal.        Thought Content: Thought content normal.        Judgment: Judgment normal.         BP 131/73   Pulse 62   Temp (!) 97 F (36.1 C) (Temporal)   Ht 4\' 11"  (1.499 m)   Wt 179 lb (81.2 kg)   SpO2 96%   BMI 36.15 kg/m   Assessment & Plan:  Cynthia Mays comes in today with chief complaint of Medical Management of Chronic Issues and Fatigue (Been going on for months goes to bed tired gets up tired )   Diagnosis and orders addressed:  1. Anxiety, generalized - diazepam (VALIUM) 5 MG tablet; Take 1 tablet (5 mg total) by mouth every 6 (six) hours as needed for anxiety or muscle spasms.  Dispense: 30 tablet; Refill: 2 - CMP14+EGFR  2. Chronic use of benzodiazepine for therapeutic purpose - diazepam (VALIUM) 5 MG tablet; Take 1 tablet (5 mg total) by mouth every 6 (six) hours as needed for anxiety or muscle spasms.  Dispense: 30 tablet; Refill: 2 - ToxASSURE Select 13 (MW), Urine - CMP14+EGFR  3. Lower abdominal pain - HYDROcodone-acetaminophen (NORCO/VICODIN) 5-325 MG tablet; Take 1 tablet by mouth every 6 (six) hours as needed for moderate pain.  Dispense: 28 tablet; Refill: 0 - CMP14+EGFR  4. Chronic left-sided low back pain with sciatica, sciatica laterality unspecified - HYDROcodone-acetaminophen (NORCO/VICODIN) 5-325 MG tablet; Take 1 tablet by mouth every 6 (six) hours as needed for moderate pain.  Dispense: 28 tablet; Refill: 0 - ToxASSURE Select 13 (MW), Urine - CMP14+EGFR  5.  Acquired hypothyroidism - levothyroxine (SYNTHROID) 112 MCG tablet; Take 1 tablet (112 mcg total) by mouth daily before breakfast.  Dispense: 90 tablet; Refill: 1 - CMP14+EGFR - TSH  6. Chronic diarrhea - mesalamine (LIALDA) 1.2 g EC tablet; TAKE 2 TABLETS BY MOUTH ONCE DAILY WITH BREAKFAST  Dispense: 180 tablet; Refill: 1 - CMP14+EGFR  7. Colonic ulcer - mesalamine (LIALDA) 1.2 g EC tablet; TAKE 2 TABLETS BY MOUTH ONCE DAILY WITH BREAKFAST  Dispense: 180 tablet; Refill: 1 - CMP14+EGFR  8. Hyperlipidemia associated with type 2 diabetes mellitus (HCC) - rosuvastatin (CRESTOR) 5 MG tablet; Take 1 tablet (5 mg total) by mouth daily.  Dispense: 90 tablet; Refill: 3 - CMP14+EGFR  9. Insomnia, unspecified type - traZODone (DESYREL) 50 MG tablet; Take 1-2 tablets (50-100 mg total) by mouth at bedtime.  Dispense: 180 tablet; Refill: 2 - CMP14+EGFR  10. Essential hypertension  - CMP14+EGFR  11. Type 2 diabetes mellitus with other specified complication, without long-term current use of insulin (HCC) - CMP14+EGFR - Bayer DCA Hb A1c Waived  12. Coronary artery disease due to lipid rich plaque -  CMP14+EGFR  13. Morbid obesity ([place]) - CMP14+EGFR  14. Controlled substance agreement signed  - CMP14+EGFR   Labs pending Patient reviewed in [place] controlled database, no flags noted. Contract and drug screen up dated today Health Maintenance reviewed Diet and exercise encouraged  Follow up plan: 3 months    Evelina Dun, FNP

## 2022-11-22 LAB — CMP14+EGFR
ALT: 10 IU/L (ref 0–32)
AST: 17 IU/L (ref 0–40)
Albumin/Globulin Ratio: 1.4 (ref 1.2–2.2)
Albumin: 4.4 g/dL (ref 3.8–4.8)
Alkaline Phosphatase: 101 IU/L (ref 44–121)
BUN/Creatinine Ratio: 21 (ref 12–28)
BUN: 21 mg/dL (ref 8–27)
Bilirubin Total: 0.5 mg/dL (ref 0.0–1.2)
CO2: 20 mmol/L (ref 20–29)
Calcium: 9.2 mg/dL (ref 8.7–10.3)
Chloride: 106 mmol/L (ref 96–106)
Creatinine, Ser: 1.01 mg/dL — ABNORMAL HIGH (ref 0.57–1.00)
Globulin, Total: 3.2 g/dL (ref 1.5–4.5)
Glucose: 134 mg/dL — ABNORMAL HIGH (ref 70–99)
Potassium: 3.6 mmol/L (ref 3.5–5.2)
Sodium: 143 mmol/L (ref 134–144)
Total Protein: 7.6 g/dL (ref 6.0–8.5)
eGFR: 56 mL/min/{1.73_m2} — ABNORMAL LOW (ref >59)

## 2022-11-22 LAB — TSH: TSH: 2.79 u[IU]/mL (ref 0.450–4.500)

## 2022-11-27 LAB — TOXASSURE SELECT 13 (MW), URINE

## 2022-12-04 ENCOUNTER — Ambulatory Visit (INDEPENDENT_AMBULATORY_CARE_PROVIDER_SITE_OTHER): Payer: 59

## 2022-12-04 VITALS — Ht 59.0 in | Wt 177.0 lb

## 2022-12-04 DIAGNOSIS — Z780 Asymptomatic menopausal state: Secondary | ICD-10-CM

## 2022-12-04 DIAGNOSIS — Z0000 Encounter for general adult medical examination without abnormal findings: Secondary | ICD-10-CM | POA: Diagnosis not present

## 2022-12-04 NOTE — Patient Instructions (Signed)
Cynthia Mays , Thank you for taking time to come for your Medicare Wellness Visit. I appreciate your ongoing commitment to your health goals. Please review the following plan we discussed and let me know if I can assist you in the future.   These are the goals we discussed:  Goals      Exercise 3x per week (30 min per time)     Increase shoulder strength/ROM and reduce pain - Get more active     Patient Stated     12/02/2021 AWV Goal: Fall Prevention  Over the next year, patient will decrease their risk for falls by: Using assistive devices, such as a cane or walker, as needed Identifying fall risks within their home and correcting them by: Removing throw rugs Adding handrails to stairs or ramps Removing clutter and keeping a clear pathway throughout the home Increasing light, especially at night Adding shower handles/bars Raising toilet seat Identifying potential personal risk factors for falls: Medication side effects Incontinence/urgency Vestibular dysfunction Hearing loss Musculoskeletal disorders Neurological disorders Orthostatic hypotension  11/29/2019 AWV Goal: Exercise for General Health  Patient will verbalize understanding of the benefits of increased physical activity: Exercising regularly is important. It will improve your overall fitness, flexibility, and endurance. Regular exercise also will improve your overall health. It can help you control your weight, reduce stress, and improve your bone density. Over the next year, patient will increase physical activity as tolerated with a goal of at least 150 minutes of moderate physical activity per week.  You can tell that you are exercising at a moderate intensity if your heart starts beating faster and you start breathing faster but can still hold a conversation. Moderate-intensity exercise ideas include: Walking 1 mile (1.6 km) in about 15 minutes Biking Hiking Golfing Dancing Water aerobics Patient will verbalize  understanding of everyday activities that increase physical activity by providing examples like the following: Yard work, such as: Sales promotion account executive Gardening Washing windows or floors Patient will be able to explain general safety guidelines for exercising:  Before you start a new exercise program, talk with your health care provider. Do not exercise so much that you hurt yourself, feel dizzy, or get very short of breath. Wear comfortable clothes and wear shoes with good support. Drink plenty of water while you exercise to prevent dehydration or heat stroke. Work out until your breathing and your heartbeat get faster.         This is a list of the screening recommended for you and due dates:  Health Maintenance  Topic Date Due   Eye exam for diabetics  01/21/2022   COVID-19 Vaccine (4 - 2023-24 season) 05/02/2022   Flu Shot  04/02/2023   Yearly kidney health urinalysis for diabetes  05/17/2023   Hemoglobin A1C  05/24/2023   Yearly kidney function blood test for diabetes  11/21/2023   Complete foot exam   11/21/2023   Medicare Annual Wellness Visit  12/04/2023   DTaP/Tdap/Td vaccine (3 - Td or Tdap) 07/31/2024   Pneumonia Vaccine  Completed   DEXA scan (bone density measurement)  Completed   Zoster (Shingles) Vaccine  Completed   HPV Vaccine  Aged Out    Advanced directives: Advance directive discussed with you today. I have provided a copy for you to complete at home and have notarized. Once this is complete please bring a copy in to our office so we can scan it  into your chart.   Conditions/risks identified: Aim for 30 minutes of exercise or brisk walking, 6-8 glasses of water, and 5 servings of fruits and vegetables each day.   Next appointment: Follow up in one year for your annual wellness visit    Preventive Care 65 Years and Older, Female Preventive care refers to lifestyle choices and  visits with your health care provider that can promote health and wellness. What does preventive care include? A yearly physical exam. This is also called an annual well check. Dental exams once or twice a year. Routine eye exams. Ask your health care provider how often you should have your eyes checked. Personal lifestyle choices, including: Daily care of your teeth and gums. Regular physical activity. Eating a healthy diet. Avoiding tobacco and drug use. Limiting alcohol use. Practicing safe sex. Taking low-dose aspirin every day. Taking vitamin and mineral supplements as recommended by your health care provider. What happens during an annual well check? The services and screenings done by your health care provider during your annual well check will depend on your age, overall health, lifestyle risk factors, and family history of disease. Counseling  Your health care provider may ask you questions about your: Alcohol use. Tobacco use. Drug use. Emotional well-being. Home and relationship well-being. Sexual activity. Eating habits. History of falls. Memory and ability to understand (cognition). Work and work Statistician. Reproductive health. Screening  You may have the following tests or measurements: Height, weight, and BMI. Blood pressure. Lipid and cholesterol levels. These may be checked every 5 years, or more frequently if you are over 45 years old. Skin check. Lung cancer screening. You may have this screening every year starting at age 109 if you have a 30-pack-year history of smoking and currently smoke or have quit within the past 15 years. Fecal occult blood test (FOBT) of the stool. You may have this test every year starting at age 9. Flexible sigmoidoscopy or colonoscopy. You may have a sigmoidoscopy every 5 years or a colonoscopy every 10 years starting at age 55. Hepatitis C blood test. Hepatitis B blood test. Sexually transmitted disease (STD)  testing. Diabetes screening. This is done by checking your blood sugar (glucose) after you have not eaten for a while (fasting). You may have this done every 1-3 years. Bone density scan. This is done to screen for osteoporosis. You may have this done starting at age 63. Mammogram. This may be done every 1-2 years. Talk to your health care provider about how often you should have regular mammograms. Talk with your health care provider about your test results, treatment options, and if necessary, the need for more tests. Vaccines  Your health care provider may recommend certain vaccines, such as: Influenza vaccine. This is recommended every year. Tetanus, diphtheria, and acellular pertussis (Tdap, Td) vaccine. You may need a Td booster every 10 years. Zoster vaccine. You may need this after age 85. Pneumococcal 13-valent conjugate (PCV13) vaccine. One dose is recommended after age 50. Pneumococcal polysaccharide (PPSV23) vaccine. One dose is recommended after age 60. Talk to your health care provider about which screenings and vaccines you need and how often you need them. This information is not intended to replace advice given to you by your health care provider. Make sure you discuss any questions you have with your health care provider. Document Released: 09/14/2015 Document Revised: 05/07/2016 Document Reviewed: 06/19/2015 Elsevier Interactive Patient Education  2017 [place] Prevention in the Home Falls can cause injuries. They can  happen to people of all ages. There are many things you can do to make your home safe and to help prevent falls. What can I do on the outside of my home? Regularly fix the edges of walkways and driveways and fix any cracks. Remove anything that might make you trip as you walk through a door, such as a raised step or threshold. Trim any bushes or trees on the path to your home. Use bright outdoor lighting. Clear any walking paths of anything that  might make someone trip, such as rocks or tools. Regularly check to see if handrails are loose or broken. Make sure that both sides of any steps have handrails. Any raised decks and porches should have guardrails on the edges. Have any leaves, snow, or ice cleared regularly. Use sand or salt on walking paths during winter. Clean up any spills in your garage right away. This includes oil or grease spills. What can I do in the bathroom? Use night lights. Install grab bars by the toilet and in the tub and shower. Do not use towel bars as grab bars. Use non-skid mats or decals in the tub or shower. If you need to sit down in the shower, use a plastic, non-slip stool. Keep the floor dry. Clean up any water that spills on the floor as soon as it happens. Remove soap buildup in the tub or shower regularly. Attach bath mats securely with double-sided non-slip rug tape. Do not have throw rugs and other things on the floor that can make you trip. What can I do in the bedroom? Use night lights. Make sure that you have a light by your bed that is easy to reach. Do not use any sheets or blankets that are too big for your bed. They should not hang down onto the floor. Have a firm chair that has side arms. You can use this for support while you get dressed. Do not have throw rugs and other things on the floor that can make you trip. What can I do in the kitchen? Clean up any spills right away. Avoid walking on wet floors. Keep items that you use a lot in easy-to-reach places. If you need to reach something above you, use a strong step stool that has a grab bar. Keep electrical cords out of the way. Do not use floor polish or wax that makes floors slippery. If you must use wax, use non-skid floor wax. Do not have throw rugs and other things on the floor that can make you trip. What can I do with my stairs? Do not leave any items on the stairs. Make sure that there are handrails on both sides of the  stairs and use them. Fix handrails that are broken or loose. Make sure that handrails are as long as the stairways. Check any carpeting to make sure that it is firmly attached to the stairs. Fix any carpet that is loose or worn. Avoid having throw rugs at the top or bottom of the stairs. If you do have throw rugs, attach them to the floor with carpet tape. Make sure that you have a light switch at the top of the stairs and the bottom of the stairs. If you do not have them, ask someone to add them for you. What else can I do to help prevent falls? Wear shoes that: Do not have high heels. Have rubber bottoms. Are comfortable and fit you well. Are closed at the toe. Do not wear sandals.  If you use a stepladder: Make sure that it is fully opened. Do not climb a closed stepladder. Make sure that both sides of the stepladder are locked into place. Ask someone to hold it for you, if possible. Clearly mark and make sure that you can see: Any grab bars or handrails. First and last steps. Where the edge of each step is. Use tools that help you move around (mobility aids) if they are needed. These include: Canes. Walkers. Scooters. Crutches. Turn on the lights when you go into a dark area. Replace any light bulbs as soon as they burn out. Set up your furniture so you have a clear path. Avoid moving your furniture around. If any of your floors are uneven, fix them. If there are any pets around you, be aware of where they are. Review your medicines with your doctor. Some medicines can make you feel dizzy. This can increase your chance of falling. Ask your doctor what other things that you can do to help prevent falls. This information is not intended to replace advice given to you by your health care provider. Make sure you discuss any questions you have with your health care provider. Document Released: 06/14/2009 Document Revised: 01/24/2016 Document Reviewed: 09/22/2014 Elsevier Interactive  Patient Education  2017 [employer].

## 2022-12-04 NOTE — Progress Notes (Signed)
Subjective:   Cynthia Mays is a 82 y.o. female who presents for Medicare Annual (Subsequent) preventive examination. I connected with  Ala Dach on 12/04/22 by a audio enabled telemedicine application and verified that I am speaking with the correct person using two identifiers.  Patient Location: Home  Provider Location: Home Office  I discussed the limitations of evaluation and management by telemedicine. The patient expressed understanding and agreed to proceed.  Review of Systems     Cardiac Risk Factors include: advanced age (>66men, >65 women);diabetes mellitus;dyslipidemia     Objective:    Today's Vitals   12/04/22 1121  Weight: 177 lb (80.3 kg)  Height: 4\' 11"  (1.499 m)   Body mass index is 35.75 kg/m.     12/04/2022   11:24 AM 09/16/2022    8:28 PM 08/26/2022   11:42 AM 12/02/2021   12:23 PM 11/29/2020   10:11 AM 07/05/2020    2:13 PM 05/04/2020    9:16 AM  Advanced Directives  Does Patient Have a Medical Advance Directive? Yes No No Yes Yes Yes No  Type of Paramedic of [place];Living will   [place];Living will [place];Living will    Copy of [place] in Chart? No - copy requested   No - copy requested No - copy requested    Would patient like information on creating a medical advance directive?       No - Patient declined    Current Medications (verified) Outpatient Encounter Medications as of 12/04/2022  Medication Sig   cetirizine (ZYRTEC ALLERGY) 10 MG tablet Take 1 tablet (10 mg total) by mouth daily.   cholecalciferol (VITAMIN D3) 25 MCG (1000 UNIT) tablet Take 1,000 Units by mouth daily.    clobetasol cream (TEMOVATE) [id number] % APPLY 1 APPLICATION TOPICALLY ONCE DAILY AS NEEDED FOR  RASH   diazepam (VALIUM) 5 MG tablet Take 1 tablet (5 mg total) by mouth every 6 (six) hours as needed for anxiety or muscle spasms.   diclofenac (VOLTAREN) 75 MG EC tablet Take 1  tablet (75 mg total) by mouth 2 (two) times daily.   FARXIGA 5 MG TABS tablet TAKE 1 TABLET BY MOUTH ONCE DAILY BEFORE BREAKFAST   fluticasone (FLONASE) 50 MCG/ACT nasal spray Place 2 sprays into the nose daily.   HYDROcodone-acetaminophen (NORCO/VICODIN) 5-325 MG tablet Take 1 tablet by mouth every 6 (six) hours as needed for moderate pain.   levothyroxine (SYNTHROID) 112 MCG tablet Take 1 tablet (112 mcg total) by mouth daily before breakfast.   meclizine (ANTIVERT) 12.5 MG tablet Take 1 tablet (12.5 mg total) by mouth 3 (three) times daily as needed for dizziness.   mesalamine (LIALDA) 1.2 g EC tablet TAKE 2 TABLETS BY MOUTH ONCE DAILY WITH BREAKFAST   Multiple Vitamins-Minerals (MULTIVITAMINS THER. W/MINERALS) TABS Take 1 tablet by mouth daily.   NIFEdipine (PROCARDIA-XL/NIFEDICAL-XL) 30 MG 24 hr tablet Take 1 tablet (30 mg total) by mouth daily.   ondansetron (ZOFRAN ODT) 8 MG disintegrating tablet Take 1 tablet (8 mg total) by mouth every 8 (eight) hours as needed for nausea or vomiting.   pantoprazole (PROTONIX) 40 MG tablet Take 1 tablet by mouth twice daily   Probiotic Product ([place]) CAPS Take 1 capsule by mouth daily.    rosuvastatin (CRESTOR) 5 MG tablet Take 1 tablet (5 mg total) by mouth daily.   sertraline (ZOLOFT) 50 MG tablet TAKE 1 & 1/2 (ONE & ONE-HALF)  TABLETS BY MOUTH ONCE DAILY   traZODone (DESYREL) 50 MG tablet Take 1-2 tablets (50-100 mg total) by mouth at bedtime.   No facility-administered encounter medications on file as of 12/04/2022.    Allergies (verified) Codeine, Penicillins, and Pravastatin   History: Past Medical History:  Diagnosis Date   Complication of anesthesia    Depression    Diabetes mellitus    Fibromyalgia    GERD (gastroesophageal reflux disease)    Hiatal hernia    Hyperlipidemia    Hypertension    Hypothyroidism    Hypothyroidism 07/23/2016   PONV (postoperative nausea and vomiting)    Past Surgical History:  Procedure  Laterality Date   APPENDECTOMY  2002   arthroscopy  right 2002, left 2007   bilateral knees   BACK SURGERY  10/2007, 09/2008   x2   BIOPSY  10/02/2016   Procedure: BIOPSY;  Surgeon: Rogene Houston, MD;  [facility];  Service: Endoscopy;;  gastric   BIOPSY  05/04/2020   Procedure: BIOPSY;  Surgeon: Harvel Quale, MD;  Location: [facility];  Service: Gastroenterology;;   [place]   bilateral   [place]   x2   COLONOSCOPY WITH PROPOFOL N/A 05/04/2020   [place]eda:The examined portion of the ileum was normal(focally active, nonspecific ileitis, neg for features of chronicity or granulomas)- A single (solitary) ulcer in the cecum.(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 tortuous colon, non bleeding internal hemorrhoids   ESOPHAGOGASTRODUODENOSCOPY N/A 10/02/2016   Procedure: ESOPHAGOGASTRODUODENOSCOPY (EGD);  Surgeon: Rogene Houston, MD;  [facility];  Service: Endoscopy;  Laterality: N/A;  11:15 - moved to 2/1 @ 3:00   ESOPHAGOGASTRODUODENOSCOPY (EGD) WITH PROPOFOL N/A 05/04/2020   Castaneda: normal esophagus, 4cm hh with four cameron ulcers, multiple gastric polyps-fundic gland polyps, erythematous mucosa in pylorus, gastric antral mucosa and oxyntic mucosa with mild chronic gastritis and reactive changes, neg h pylori, normal examined duodenum   KNEE SURGERY     both   Family History  Problem Relation Age of Onset   Anesthesia problems Mother    CAD Mother 16   Cancer Mother        Pancreatic cancer   Hypertension Father    Suicidality Father        gunshot   Deafness Sister    Heart disease Brother    Suicidality Son    Cancer Sister        female    Suicidality Brother    Hypotension Neg Hx    Pseudochol deficiency Neg Hx    Malignant hyperthermia Neg Hx    Social History   Socioeconomic  History   Marital status: Widowed    Spouse name: Not on file   Number of children: 4   Years of education: 54   Highest education level: 12th grade  Occupational History    Employer: RETIRED  Tobacco Use   Smoking status: Former    Packs/day: 1.00    Years: 43.00    Additional pack years: 0.00    Total pack years: 43.00    Types: Cigarettes    Quit date: 05/02/1970    Years since quitting: 52.6   Smokeless tobacco: Never   Tobacco comments:    > 20 years quit  Vaping Use   Vaping Use: Never used  Substance and Sexual Activity   Alcohol use: No  Comment: occasionally wine   Drug use: No   Sexual activity: Not Currently    Birth control/protection: Post-menopausal  Other Topics Concern   Not on file  Social History Narrative   Lives alone.  Widowed    1 dog, 2 cats   2 daughters, 1 local   Enjoys dancing and sewing   Daughter lives 5 miles away, grandchildren grown and visit often   Social Determinants of Health   Financial Resource Strain: Low Risk  (12/04/2022)   Overall Financial Resource Strain (CARDIA)    Difficulty of Paying Living Expenses: Not hard at all  Food Insecurity: No Food Insecurity (12/04/2022)   Hunger Vital Sign    Worried About Running Out of Food in the Last Year: Never true    [place] in the Last Year: Never true  Transportation Needs: No Transportation Needs (12/04/2022)   PRAPARE - Hydrologist (Medical): No    Lack of Transportation (Non-Medical): No  Physical Activity: Insufficiently Active (12/04/2022)   Exercise Vital Sign    Days of Exercise per Week: 3 days    Minutes of Exercise per Session: 30 min  Stress: No Stress Concern Present (12/04/2022)   [place]    Feeling of Stress : Not at all  Social Connections: Socially Isolated (12/04/2022)   Social Connection and Isolation Panel [NHANES]    Frequency of Communication with Friends and  Family: More than three times a week    Frequency of Social Gatherings with Friends and Family: More than three times a week    Attends Religious Services: Never    Marine scientist or Organizations: No    Attends Archivist Meetings: Never    Marital Status: Widowed    Tobacco Counseling Counseling given: Not Answered Tobacco comments: > 20 years quit   Clinical Intake:  Pre-visit preparation completed: Yes  Pain : No/denies pain     Nutritional Risks: None Diabetes: Yes CBG done?: No Did pt. bring in CBG monitor from home?: No  How often do you need to have someone help you when you read instructions, pamphlets, or other written materials from your doctor or pharmacy?: 1 - Never  Diabetic?yes  Nutrition Risk Assessment:  Has the patient had any N/V/D within the last 2 months?  No  Does the patient have any non-healing wounds?  No  Has the patient had any unintentional weight loss or weight gain?  No   Diabetes:  Is the patient diabetic?  Yes  If diabetic, was a CBG obtained today?  No  Did the patient bring in their glucometer from home?  No  How often do you monitor your CBG's? Never .   Financial Strains and Diabetes Management:  Are you having any financial strains with the device, your supplies or your medication? No .  Does the patient want to be seen by Chronic Care Management for management of their diabetes?  No  Would the patient like to be referred to a Nutritionist or for Diabetic Management?  No   Diabetic Exams:  Diabetic Eye Exam: Completed 12/2022 Diabetic Foot Exam: Overdue, Pt has been advised about the importance in completing this exam. Pt is scheduled for diabetic foot exam on next office visit .   Interpreter Needed?: No  Information entered by :: Jadene Pierini, LPN   Activities of Daily Living    12/04/2022   11:24 AM  In your present state of health, do you have any difficulty performing the following activities:   Hearing? 0  Vision? 0  Difficulty concentrating or making decisions? 0  Walking or climbing stairs? 0  Dressing or bathing? 0  Doing errands, shopping? 0  Preparing Food and eating ? N  Using the Toilet? N  In the past six months, have you accidently leaked urine? N  Do you have problems with loss of bowel control? N  Managing your Medications? N  Managing your Finances? N  Housekeeping or managing your Housekeeping? N    Patient Care Team: Sharion Balloon, FNP as PCP - General (Family Medicine) Lavera Guise, [facility] as Pharmacist (Family Medicine) Ariba Crumb, MD as Consulting Physician (Orthopedic Surgery) Steffanie Rainwater, DPM as Consulting Physician (Podiatry) Elonda Husky Mertie Clause, MD as Consulting Physician (Obstetrics and Gynecology) Harlen Labs, MD as Referring Physician (Optometry)  Indicate any recent Medical Services you may have received from other than [facility] providers in the past year (date may be approximate).     Assessment:   This is a routine wellness examination for [place].  Hearing/Vision screen Vision Screening - Comments:: Wears rx glasses - up to date with routine eye exams with  Dr.lee   Dietary issues and exercise activities discussed: Current Exercise Habits: Home exercise routine, Type of exercise: walking, Time (Minutes): 30, Frequency (Times/Week): 3, Weekly Exercise (Minutes/Week): 90, Intensity: Mild, Exercise limited by: None identified   Goals Addressed             This Visit's Progress    Exercise 3x per week (30 min per time)   On track    Increase shoulder strength/ROM and reduce pain - Get more active       Depression Screen    12/04/2022   11:23 AM 11/21/2022   10:18 AM 08/15/2022   10:02 AM 02/11/2022   10:54 AM 12/02/2021   12:14 PM 11/11/2021   10:45 AM 08/13/2021    9:55 AM  PHQ 2/9 Scores  PHQ - 2 Score 0 0 0 1 0 0 0  PHQ- 9 Score 0 3 6 4 4 3      Fall Risk    12/04/2022   11:22 AM 11/21/2022   10:15 AM 08/15/2022    10:00 AM 05/16/2022   10:56 AM 02/11/2022   10:54 AM  Fall Risk   Falls in the past year? 0 1 0 0 1  Number falls in past yr: 0 0 0  0  Injury with Fall? 0 0 0  1  Risk for fall due to : No Fall Risks Impaired mobility No Fall Risks    Follow up Falls prevention discussed Falls evaluation completed Falls evaluation completed  Falls prevention discussed    FALL RISK PREVENTION PERTAINING TO THE HOME:  Any stairs in or around the home? Yes  If so, are there any without handrails? No  Home free of loose throw rugs in walkways, pet beds, electrical cords, etc? Yes  Adequate lighting in your home to reduce risk of falls? Yes   ASSISTIVE DEVICES UTILIZED TO PREVENT FALLS:  Life alert? No  Use of a cane, walker or w/c? No  Grab bars in the bathroom? Yes  Shower chair or bench in shower? Yes  Elevated toilet seat or a handicapped toilet? Yes          12/04/2022   11:25 AM 12/02/2021   12:21 PM 11/29/2019    9:10 AM  6CIT Screen  What Year? 0 points 0 points 0 points  What month? 0 points 0 points 0 points  What time? 0 points 0 points 0 points  Count back from 20 0 points 0 points 0 points  Months in reverse 0 points 0 points 2 points  Repeat phrase 0 points 2 points 4 points  Total Score 0 points 2 points 6 points    Immunizations Immunization History  Administered Date(s) Administered   Fluad Quad(high Dose 65+) 05/24/2020, 05/16/2022   Influenza, High Dose Seasonal PF 06/13/2013, 06/18/2016, 06/21/2018, 05/03/2019   Influenza-Unspecified 06/01/2021   Moderna Sars-Covid-2 Vaccination 09/13/2019, 10/14/2019, 08/29/2020   PFIZER SARS-COV-2 Pediatric Vaccination 5-69yrs 06/06/2020   Pneumococcal Conjugate-13 10/01/2015   Pneumococcal Polysaccharide-23 10/01/2007   Pneumococcal-Unspecified 06/05/2017   Td,absorbed, Preservative Free, Adult Use, Lf Unspecified 07/17/2000   Tdap 07/17/2000, 07/31/2014   Zoster Recombinat (Shingrix) 05/14/2021, 11/11/2021    TDAP status: Up to  date  Flu Vaccine status: Up to date  Pneumococcal vaccine status: Up to date  Covid-19 vaccine status: Completed vaccines  Qualifies for Shingles Vaccine? Yes   Zostavax completed Yes   Shingrix Completed?: Yes  Screening Tests Health Maintenance  Topic Date Due   OPHTHALMOLOGY EXAM  01/21/2022   COVID-19 Vaccine (4 - 2023-24 season) 05/02/2022   INFLUENZA VACCINE  04/02/2023   Diabetic kidney evaluation - Urine ACR  05/17/2023   HEMOGLOBIN A1C  05/24/2023   Diabetic kidney evaluation - eGFR measurement  11/21/2023   FOOT EXAM  11/21/2023   Medicare Annual Wellness (AWV)  12/04/2023   DTaP/Tdap/Td (3 - Td or Tdap) 07/31/2024   Pneumonia Vaccine 45+ Years old  Completed   DEXA SCAN  Completed   Zoster Vaccines- Shingrix  Completed   HPV VACCINES  Aged Out    Health Maintenance  Health Maintenance Due  Topic Date Due   OPHTHALMOLOGY EXAM  01/21/2022   COVID-19 Vaccine (4 - 2023-24 season) 05/02/2022    Colorectal cancer screening: No longer required.   Mammogram status: No longer required due to age .  Bone Density status: Ordered 12/01/2022. Pt provided with contact info and advised to call to schedule appt.  Lung Cancer Screening: (Low Dose CT Chest recommended if Age 10-80 years, 30 pack-year currently smoking OR have quit w/in 15years.) does not qualify.   Lung Cancer Screening Referral: n/a  Additional Screening:  Hepatitis C Screening: does not qualify;   Vision Screening: Recommended annual ophthalmology exams for early detection of glaucoma and other disorders of the eye. Is the patient up to date with their annual eye exam?  Yes  Who is the provider or what is the name of the office in which the patient attends annual eye exams? Dr.Lee  If pt is not established with a provider, would they like to be referred to a provider to establish care? No .   Dental Screening: Recommended annual dental exams for proper oral hygiene  Community Resource Referral /  Chronic Care Management: CRR required this visit?  No   CCM required this visit?  No      Plan:     I have personally reviewed and noted the following in the patient's chart:   Medical and social history Use of alcohol, tobacco or illicit drugs  Current medications and supplements including opioid prescriptions. Patient is not currently taking opioid prescriptions. Functional ability and status Nutritional status Physical activity Advanced directives List of other physicians Hospitalizations, surgeries, and ER visits in previous 12 months Vitals Screenings to include  cognitive, depression, and falls Referrals and appointments  In addition, I have reviewed and discussed with patient certain preventive protocols, quality metrics, and best practice recommendations. A written personalized care plan for preventive services as well as general preventive health recommendations were provided to patient.     Daphane Shepherd, LPN   [id number]   Nurse Notes: none

## 2023-01-02 DIAGNOSIS — M4807 Spinal stenosis, lumbosacral region: Secondary | ICD-10-CM | POA: Diagnosis not present

## 2023-01-02 DIAGNOSIS — G5731 Lesion of lateral popliteal nerve, right lower limb: Secondary | ICD-10-CM | POA: Diagnosis not present

## 2023-01-02 DIAGNOSIS — M5417 Radiculopathy, lumbosacral region: Secondary | ICD-10-CM | POA: Diagnosis not present

## 2023-01-02 DIAGNOSIS — M48062 Spinal stenosis, lumbar region with neurogenic claudication: Secondary | ICD-10-CM | POA: Diagnosis not present

## 2023-01-08 DIAGNOSIS — R262 Difficulty in walking, not elsewhere classified: Secondary | ICD-10-CM | POA: Diagnosis not present

## 2023-01-08 DIAGNOSIS — M170 Bilateral primary osteoarthritis of knee: Secondary | ICD-10-CM | POA: Diagnosis not present

## 2023-01-08 DIAGNOSIS — M25562 Pain in left knee: Secondary | ICD-10-CM | POA: Diagnosis not present

## 2023-01-08 DIAGNOSIS — M25561 Pain in right knee: Secondary | ICD-10-CM | POA: Diagnosis not present

## 2023-01-15 DIAGNOSIS — M25562 Pain in left knee: Secondary | ICD-10-CM | POA: Diagnosis not present

## 2023-01-15 DIAGNOSIS — R262 Difficulty in walking, not elsewhere classified: Secondary | ICD-10-CM | POA: Diagnosis not present

## 2023-01-15 DIAGNOSIS — M170 Bilateral primary osteoarthritis of knee: Secondary | ICD-10-CM | POA: Diagnosis not present

## 2023-01-15 DIAGNOSIS — M25561 Pain in right knee: Secondary | ICD-10-CM | POA: Diagnosis not present

## 2023-01-22 DIAGNOSIS — R262 Difficulty in walking, not elsewhere classified: Secondary | ICD-10-CM | POA: Diagnosis not present

## 2023-01-22 DIAGNOSIS — M25561 Pain in right knee: Secondary | ICD-10-CM | POA: Diagnosis not present

## 2023-01-22 DIAGNOSIS — M170 Bilateral primary osteoarthritis of knee: Secondary | ICD-10-CM | POA: Diagnosis not present

## 2023-01-22 DIAGNOSIS — M25562 Pain in left knee: Secondary | ICD-10-CM | POA: Diagnosis not present

## 2023-01-29 DIAGNOSIS — M170 Bilateral primary osteoarthritis of knee: Secondary | ICD-10-CM | POA: Diagnosis not present

## 2023-01-29 DIAGNOSIS — R262 Difficulty in walking, not elsewhere classified: Secondary | ICD-10-CM | POA: Diagnosis not present

## 2023-01-29 DIAGNOSIS — M25562 Pain in left knee: Secondary | ICD-10-CM | POA: Diagnosis not present

## 2023-01-29 DIAGNOSIS — M25561 Pain in right knee: Secondary | ICD-10-CM | POA: Diagnosis not present

## 2023-02-10 DIAGNOSIS — M7751 Other enthesopathy of right foot: Secondary | ICD-10-CM | POA: Diagnosis not present

## 2023-02-10 DIAGNOSIS — M79671 Pain in right foot: Secondary | ICD-10-CM | POA: Diagnosis not present

## 2023-02-12 DIAGNOSIS — M170 Bilateral primary osteoarthritis of knee: Secondary | ICD-10-CM | POA: Diagnosis not present

## 2023-02-12 DIAGNOSIS — M62830 Muscle spasm of back: Secondary | ICD-10-CM | POA: Diagnosis not present

## 2023-02-12 DIAGNOSIS — M2569 Stiffness of other specified joint, not elsewhere classified: Secondary | ICD-10-CM | POA: Diagnosis not present

## 2023-02-12 DIAGNOSIS — M545 Low back pain, unspecified: Secondary | ICD-10-CM | POA: Diagnosis not present

## 2023-02-12 DIAGNOSIS — R262 Difficulty in walking, not elsewhere classified: Secondary | ICD-10-CM | POA: Diagnosis not present

## 2023-02-12 DIAGNOSIS — M25561 Pain in right knee: Secondary | ICD-10-CM | POA: Diagnosis not present

## 2023-02-12 DIAGNOSIS — M25562 Pain in left knee: Secondary | ICD-10-CM | POA: Diagnosis not present

## 2023-02-23 ENCOUNTER — Other Ambulatory Visit: Payer: 59

## 2023-02-23 ENCOUNTER — Ambulatory Visit (INDEPENDENT_AMBULATORY_CARE_PROVIDER_SITE_OTHER): Payer: 59 | Admitting: Family

## 2023-02-23 ENCOUNTER — Encounter: Payer: Self-pay | Admitting: Family

## 2023-02-23 VITALS — BP 127/75 | HR 51 | Temp 97.2°F | Ht 59.0 in | Wt 182.4 lb

## 2023-02-23 DIAGNOSIS — K219 Gastro-esophageal reflux disease without esophagitis: Secondary | ICD-10-CM

## 2023-02-23 DIAGNOSIS — F411 Generalized anxiety disorder: Secondary | ICD-10-CM

## 2023-02-23 DIAGNOSIS — R1030 Lower abdominal pain, unspecified: Secondary | ICD-10-CM | POA: Diagnosis not present

## 2023-02-23 DIAGNOSIS — I10 Essential (primary) hypertension: Secondary | ICD-10-CM

## 2023-02-23 DIAGNOSIS — E1169 Type 2 diabetes mellitus with other specified complication: Secondary | ICD-10-CM

## 2023-02-23 DIAGNOSIS — I2510 Atherosclerotic heart disease of native coronary artery without angina pectoris: Secondary | ICD-10-CM

## 2023-02-23 DIAGNOSIS — Z981 Arthrodesis status: Secondary | ICD-10-CM

## 2023-02-23 DIAGNOSIS — Z0000 Encounter for general adult medical examination without abnormal findings: Secondary | ICD-10-CM

## 2023-02-23 DIAGNOSIS — M5441 Lumbago with sciatica, right side: Secondary | ICD-10-CM | POA: Diagnosis not present

## 2023-02-23 DIAGNOSIS — E785 Hyperlipidemia, unspecified: Secondary | ICD-10-CM

## 2023-02-23 DIAGNOSIS — E039 Hypothyroidism, unspecified: Secondary | ICD-10-CM

## 2023-02-23 DIAGNOSIS — Z79899 Other long term (current) drug therapy: Secondary | ICD-10-CM | POA: Diagnosis not present

## 2023-02-23 DIAGNOSIS — G8929 Other chronic pain: Secondary | ICD-10-CM

## 2023-02-23 DIAGNOSIS — G4700 Insomnia, unspecified: Secondary | ICD-10-CM

## 2023-02-23 DIAGNOSIS — I2583 Coronary atherosclerosis due to lipid rich plaque: Secondary | ICD-10-CM

## 2023-02-23 DIAGNOSIS — Z0001 Encounter for general adult medical examination with abnormal findings: Secondary | ICD-10-CM | POA: Diagnosis not present

## 2023-02-23 DIAGNOSIS — Z6836 Body mass index (BMI) 36.0-36.9, adult: Secondary | ICD-10-CM

## 2023-02-23 LAB — BAYER DCA HB A1C WAIVED: HB A1C (BAYER DCA - WAIVED): 6.1 % — ABNORMAL HIGH (ref 4.8–5.6)

## 2023-02-23 MED ORDER — DIAZEPAM 5 MG PO TABS
5.0000 mg | ORAL_TABLET | Freq: Four times a day (QID) | ORAL | 2 refills | Status: DC | PRN
Start: 2023-02-23 — End: 2023-05-26

## 2023-02-23 MED ORDER — HYDROCODONE-ACETAMINOPHEN 5-325 MG PO TABS
1.0000 | ORAL_TABLET | Freq: Four times a day (QID) | ORAL | 0 refills | Status: DC | PRN
Start: 2023-02-23 — End: 2023-03-05

## 2023-02-23 MED ORDER — GABAPENTIN 300 MG PO CAPS
300.0000 mg | ORAL_CAPSULE | Freq: Three times a day (TID) | ORAL | 3 refills | Status: DC
Start: 2023-02-23 — End: 2023-11-24

## 2023-02-23 NOTE — Progress Notes (Signed)
Subjective:    Patient ID: Cynthia Mays, female    DOB: [birth year], 82 y.o.   MRN: [id number]  Chief Complaint  Patient presents with   Medical Management of Chronic Issues    3 month follow up    PT presents to the office today for CPE and chronic follow up. She is morbid obese with a BMI of 36 and DM and HTN.    She has CAD and taking Crestor daily.  Hypertension This is a chronic problem. The current episode started more than 1 year ago. The problem has been resolved since onset. The problem is controlled. Associated symptoms include anxiety, malaise/fatigue and peripheral edema (right foot). Pertinent negatives include no blurred vision or shortness of breath. Risk factors for coronary artery disease include diabetes mellitus, dyslipidemia, obesity and sedentary lifestyle. The current treatment provides moderate improvement. Identifiable causes of hypertension include a thyroid problem.  Gastroesophageal Reflux She complains of belching and heartburn. This is a chronic problem. The current episode started more than 1 year ago. The problem occurs occasionally. Associated symptoms include fatigue. Risk factors include obesity. She has tried a PPI for the symptoms. The treatment provided moderate relief.  Thyroid Problem Presents for follow-up visit. Symptoms include fatigue. Patient reports no anxiety, constipation or diarrhea. The symptoms have been stable. Her past medical history is significant for hyperlipidemia.  Hyperlipidemia This is a chronic problem. The current episode started more than 1 year ago. The problem is uncontrolled. Exacerbating diseases include obesity. Pertinent negatives include no shortness of breath. Current antihyperlipidemic treatment includes statins. The current treatment provides moderate improvement of lipids. Risk factors for coronary artery disease include dyslipidemia, hypertension, a sedentary lifestyle, post-menopausal and diabetes mellitus.   Diabetes She presents for her follow-up diabetic visit. She has type 2 diabetes mellitus. Pertinent negatives for hypoglycemia include no nervousness/anxiousness. Associated symptoms include fatigue and foot paresthesias. Pertinent negatives for diabetes include no blurred vision. Risk factors for coronary artery disease include diabetes mellitus, dyslipidemia, hypertension and sedentary lifestyle. She is following a generally unhealthy diet. (Does not check glucose at home)  Back Pain This is a chronic problem. The current episode started more than 1 year ago. The problem occurs intermittently. The problem has been waxing and waning since onset. The pain is present in the lumbar spine. The quality of the pain is described as aching. The pain radiates to the right thigh. The pain is at a severity of 4/10. The pain is moderate. She has tried analgesics and bed rest for the symptoms. The treatment provided mild relief.  Insomnia Primary symptoms: difficulty falling asleep, malaise/fatigue.   The current episode started more than one year. The onset quality is gradual. The problem occurs intermittently. Past treatments include medication. The treatment provided moderate relief.  Anxiety Presents for follow-up visit. Symptoms include excessive worry and insomnia. Patient reports no nervous/anxious behavior or shortness of breath. Symptoms occur occasionally. The severity of symptoms is moderate.        Review of Systems  Constitutional:  Positive for fatigue and malaise/fatigue.  Eyes:  Negative for blurred vision.  Respiratory:  Negative for shortness of breath.   Gastrointestinal:  Positive for heartburn. Negative for constipation and diarrhea.  Musculoskeletal:  Positive for back pain.  Psychiatric/Behavioral:  The patient has insomnia. The patient is not nervous/anxious.   All other systems reviewed and are negative.  Family History  Problem Relation Age of Onset   Anesthesia problems  Mother    CAD Mother 70  Cancer Mother        Pancreatic cancer   Hypertension Father    Suicidality Father        gunshot   Deafness Sister    Heart disease Brother    Suicidality Son    Cancer Sister        female    Suicidality Brother    Hypotension Neg Hx    Pseudochol deficiency Neg Hx    Malignant hyperthermia Neg Hx    Social History   Socioeconomic History   Marital status: Widowed    Spouse name: Not on file   Number of children: 4   Years of education: 81   Highest education level: 12th grade  Occupational History    Employer: RETIRED  Tobacco Use   Smoking status: Former    Packs/day: 1.00    Years: 43.00    Additional pack years: 0.00    Total pack years: 43.00    Types: Cigarettes    Quit date: 05/02/1970    Years since quitting: 52.8   Smokeless tobacco: Never   Tobacco comments:    > 20 years quit  Vaping Use   Vaping Use: Never used  Substance and Sexual Activity   Alcohol use: No    Comment: occasionally wine   Drug use: No   Sexual activity: Not Currently    Birth control/protection: Post-menopausal  Other Topics Concern   Not on file  Social History Narrative   Lives alone.  Widowed    1 dog, 2 cats   2 daughters, 1 local   Enjoys dancing and sewing   Daughter lives 5 miles away, grandchildren grown and visit often   Social Determinants of Health   Financial Resource Strain: Low Risk  (12/04/2022)   Overall Financial Resource Strain (CARDIA)    Difficulty of Paying Living Expenses: Not hard at all  Food Insecurity: No Food Insecurity (12/04/2022)   Hunger Vital Sign    Worried About Running Out of Food in the Last Year: Never true    Ran Out of Food in the Last Year: Never true  Transportation Needs: No Transportation Needs (12/04/2022)   PRAPARE - Administrator, Civil Service (Medical): No    Lack of Transportation (Non-Medical): No  Physical Activity: Insufficiently Active (12/04/2022)   Exercise Vital Sign    Days of  Exercise per Week: 3 days    Minutes of Exercise per Session: 30 min  Stress: No Stress Concern Present (12/04/2022)   [employer] of Occupational Health - Occupational Stress Questionnaire    Feeling of Stress : Not at all  Social Connections: Socially Isolated (12/04/2022)   Social Connection and Isolation Panel [NHANES]    Frequency of Communication with Friends and Family: More than three times a week    Frequency of Social Gatherings with Friends and Family: More than three times a week    Attends Religious Services: Never    Database administrator or Organizations: No    Attends Banker Meetings: Never    Marital Status: Widowed       Objective:   Physical Exam Vitals reviewed.  Constitutional:      General: She is not in acute distress.    Appearance: She is well-developed. She is obese.  HENT:     Head: Normocephalic and atraumatic.     Right Ear: Tympanic membrane normal.     Left Ear: Tympanic membrane normal.  Eyes:  Pupils: Pupils are equal, round, and reactive to light.  Neck:     Thyroid: No thyromegaly.  Cardiovascular:     Rate and Rhythm: Normal rate and regular rhythm.     Heart sounds: Normal heart sounds. No murmur heard. Pulmonary:     Effort: Pulmonary effort is normal. No respiratory distress.     Breath sounds: Normal breath sounds. No wheezing.  Abdominal:     General: Bowel sounds are normal. There is no distension.     Palpations: Abdomen is soft.     Tenderness: There is no abdominal tenderness.  Musculoskeletal:        General: No tenderness. Normal range of motion.     Cervical back: Normal range of motion and neck supple.     Right lower leg: Edema (trace) present.  Skin:    General: Skin is warm and dry.  Neurological:     Mental Status: She is alert and oriented to person, place, and time.     Cranial Nerves: No cranial nerve deficit.     Deep Tendon Reflexes: Reflexes are normal and symmetric.  Psychiatric:         Behavior: Behavior normal.        Thought Content: Thought content normal.        Judgment: Judgment normal.      BP 127/75   Pulse (!) 51   Temp (!) 97.2 F (36.2 C) (Temporal)   Ht 4\' 11"  (1.499 m)   Wt 182 lb 6.4 oz (82.7 kg)   SpO2 96%   BMI 36.84 kg/m      Assessment & Plan:  DREYA BUHRMAN comes in today with chief complaint of Medical Management of Chronic Issues (3 month follow up )   Diagnosis and orders addressed:  1. Anxiety, generalized - diazepam (VALIUM) 5 MG tablet; Take 1 tablet (5 mg total) by mouth every 6 (six) hours as needed for anxiety or muscle spasms.  Dispense: 30 tablet; Refill: 2 - CMP14+EGFR - CBC with Differential/Platelet  2. Chronic use of benzodiazepine for therapeutic purpose - diazepam (VALIUM) 5 MG tablet; Take 1 tablet (5 mg total) by mouth every 6 (six) hours as needed for anxiety or muscle spasms.  Dispense: 30 tablet; Refill: 2 - CMP14+EGFR - CBC with Differential/Platelet  3. Lower abdominal pain - HYDROcodone-acetaminophen (NORCO/VICODIN) 5-325 MG tablet; Take 1 tablet by mouth every 6 (six) hours as needed for moderate pain.  Dispense: 28 tablet; Refill: 0 - CMP14+EGFR - CBC with Differential/Platelet  4. 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  Dispense: 28 tablet; Refill: 0 - CMP14+EGFR - CBC with Differential/Platelet - gabapentin (NEURONTIN) 300 MG capsule; Take 1 capsule (300 mg total) by mouth 3 (three) times daily.  Dispense: 90 capsule; Refill: 3  5. Controlled substance agreement signed - CMP14+EGFR - CBC with Differential/Platelet  6. Coronary artery disease due to lipid rich plaque - CMP14+EGFR - CBC with Differential/Platelet - Lipid panel  7. Type 2 diabetes mellitus with other specified complication, without long-term current use of insulin (HCC) -  CMP14+EGFR - CBC with Differential/Platelet - Bayer DCA Hb A1c Waived  8. Essential hypertension - CMP14+EGFR - CBC with Differential/Platelet  9. Gastroesophageal reflux disease without esophagitis - CMP14+EGFR - CBC with Differential/Platelet  10. History of lumbar fusion - CMP14+EGFR - CBC with Differential/Platelet  11. Hyperlipidemia associated with type 2  diabetes mellitus (HCC) - CMP14+EGFR - CBC with Differential/Platelet - Lipid panel  12. Acquired hypothyroidism - CMP14+EGFR - CBC with Differential/Platelet - TSH  13. Insomnia, unspecified type - CMP14+EGFR - CBC with Differential/Platelet  14. Morbid obesity (HCC) - CMP14+EGFR - CBC with Differential/Platelet  15. Annual physical exam - CMP14+EGFR - CBC with Differential/Platelet - Lipid panel - TSH   Labs pending Patient reviewed in [place] controlled database, no flags noted. Contract and drug screen are up to date.  Health Maintenance reviewed Diet and exercise encouraged  Follow up plan: 3 months    [name], FNP

## 2023-02-23 NOTE — Patient Instructions (Signed)

## 2023-02-24 ENCOUNTER — Other Ambulatory Visit: Payer: Self-pay | Admitting: Family

## 2023-02-24 LAB — CBC WITH DIFFERENTIAL/PLATELET
Basophils Absolute: 0.1 10*3/uL (ref 0.0–0.2)
Basos: 1 %
EOS (ABSOLUTE): 0.2 10*3/uL (ref 0.0–0.4)
Eos: 5 %
Hematocrit: 39.2 % (ref 34.0–46.6)
Hemoglobin: 12.9 g/dL (ref 11.1–15.9)
Immature Grans (Abs): 0 10*3/uL (ref 0.0–0.1)
Immature Granulocytes: 0 %
Lymphocytes Absolute: 1.3 10*3/uL (ref 0.7–3.1)
Lymphs: 28 %
MCH: 28.4 pg (ref 26.6–33.0)
MCHC: 32.9 g/dL (ref 31.5–35.7)
MCV: 86 fL (ref 79–97)
Monocytes Absolute: 0.4 10*3/uL (ref 0.1–0.9)
Monocytes: 7 %
Neutrophils Absolute: 2.8 10*3/uL (ref 1.4–7.0)
Neutrophils: 59 %
Platelets: 191 10*3/uL (ref 150–450)
RBC: 4.55 x10E6/uL (ref 3.77–5.28)
RDW: 14.6 % (ref 11.7–15.4)
WBC: 4.8 10*3/uL (ref 3.4–10.8)

## 2023-02-24 LAB — CMP14+EGFR
ALT: 9 IU/L (ref 0–32)
AST: 21 IU/L (ref 0–40)
Albumin: 4.1 g/dL (ref 3.8–4.8)
Alkaline Phosphatase: 95 IU/L (ref 44–121)
BUN/Creatinine Ratio: 16 (ref 12–28)
BUN: 18 mg/dL (ref 8–27)
Bilirubin Total: 0.5 mg/dL (ref 0.0–1.2)
CO2: 22 mmol/L (ref 20–29)
Calcium: 9.1 mg/dL (ref 8.7–10.3)
Chloride: 108 mmol/L — ABNORMAL HIGH (ref 96–106)
Creatinine, Ser: 1.16 mg/dL — ABNORMAL HIGH (ref 0.57–1.00)
Globulin, Total: 2.9 g/dL (ref 1.5–4.5)
Glucose: 105 mg/dL — ABNORMAL HIGH (ref 70–99)
Potassium: 4 mmol/L (ref 3.5–5.2)
Sodium: 143 mmol/L (ref 134–144)
Total Protein: 7 g/dL (ref 6.0–8.5)
eGFR: 48 mL/min/{1.73_m2} — ABNORMAL LOW (ref >59)

## 2023-02-24 LAB — LIPID PANEL
Chol/HDL Ratio: 4.3 ratio (ref 0.0–4.4)
Cholesterol, Total: 163 mg/dL (ref 100–199)
HDL: 38 mg/dL — ABNORMAL LOW (ref >39)
LDL Chol Calc (NIH): 92 mg/dL (ref 0–99)
Triglycerides: 191 mg/dL — ABNORMAL HIGH (ref 0–149)
VLDL Cholesterol Cal: 33 mg/dL (ref 5–40)

## 2023-02-24 LAB — TSH: TSH: 8.21 u[IU]/mL — ABNORMAL HIGH (ref 0.450–4.500)

## 2023-02-24 MED ORDER — LEVOTHYROXINE SODIUM 125 MCG PO TABS: 125.0000 ug | ORAL_TABLET | Freq: Every day | ORAL | 1 refills | Status: DC

## 2023-03-03 ENCOUNTER — Observation Stay (HOSPITAL_COMMUNITY)
Admission: EM | Admit: 2023-03-03 | Discharge: 2023-03-05 | Disposition: A | Discharge status: Home Health | Payer: No Typology Code available for payment source | Attending: Family Medicine | Admitting: Family Medicine

## 2023-03-03 ENCOUNTER — Other Ambulatory Visit: Payer: Self-pay

## 2023-03-03 ENCOUNTER — Emergency Department (HOSPITAL_COMMUNITY): Payer: No Typology Code available for payment source

## 2023-03-03 ENCOUNTER — Encounter (HOSPITAL_COMMUNITY): Payer: Self-pay | Admitting: Emergency Medicine

## 2023-03-03 DIAGNOSIS — J9601 Acute respiratory failure with hypoxia: Secondary | ICD-10-CM | POA: Diagnosis not present

## 2023-03-03 DIAGNOSIS — E119 Type 2 diabetes mellitus without complications: Secondary | ICD-10-CM | POA: Diagnosis not present

## 2023-03-03 DIAGNOSIS — S32029A Unspecified fracture of second lumbar vertebra, initial encounter for closed fracture: Principal | ICD-10-CM | POA: Insufficient documentation

## 2023-03-03 DIAGNOSIS — M6281 Muscle weakness (generalized): Secondary | ICD-10-CM | POA: Insufficient documentation

## 2023-03-03 DIAGNOSIS — S3993XA Unspecified injury of pelvis, initial encounter: Secondary | ICD-10-CM | POA: Diagnosis not present

## 2023-03-03 DIAGNOSIS — K449 Diaphragmatic hernia without obstruction or gangrene: Secondary | ICD-10-CM | POA: Diagnosis not present

## 2023-03-03 DIAGNOSIS — K4020 Bilateral inguinal hernia, without obstruction or gangrene, not specified as recurrent: Secondary | ICD-10-CM | POA: Insufficient documentation

## 2023-03-03 DIAGNOSIS — Z87891 Personal history of nicotine dependence: Secondary | ICD-10-CM | POA: Insufficient documentation

## 2023-03-03 DIAGNOSIS — R52 Pain, unspecified: Secondary | ICD-10-CM | POA: Diagnosis present

## 2023-03-03 DIAGNOSIS — R2681 Unsteadiness on feet: Secondary | ICD-10-CM | POA: Diagnosis not present

## 2023-03-03 DIAGNOSIS — R2689 Other abnormalities of gait and mobility: Secondary | ICD-10-CM | POA: Insufficient documentation

## 2023-03-03 DIAGNOSIS — K219 Gastro-esophageal reflux disease without esophagitis: Secondary | ICD-10-CM | POA: Diagnosis present

## 2023-03-03 DIAGNOSIS — I700 Atherosclerosis of aorta: Secondary | ICD-10-CM | POA: Insufficient documentation

## 2023-03-03 DIAGNOSIS — R918 Other nonspecific abnormal finding of lung field: Secondary | ICD-10-CM | POA: Diagnosis not present

## 2023-03-03 DIAGNOSIS — K760 Fatty (change of) liver, not elsewhere classified: Secondary | ICD-10-CM | POA: Insufficient documentation

## 2023-03-03 DIAGNOSIS — E039 Hypothyroidism, unspecified: Secondary | ICD-10-CM | POA: Insufficient documentation

## 2023-03-03 DIAGNOSIS — S0990XA Unspecified injury of head, initial encounter: Secondary | ICD-10-CM | POA: Diagnosis not present

## 2023-03-03 DIAGNOSIS — I10 Essential (primary) hypertension: Secondary | ICD-10-CM | POA: Insufficient documentation

## 2023-03-03 DIAGNOSIS — K429 Umbilical hernia without obstruction or gangrene: Secondary | ICD-10-CM | POA: Diagnosis not present

## 2023-03-03 DIAGNOSIS — Z79899 Other long term (current) drug therapy: Secondary | ICD-10-CM | POA: Insufficient documentation

## 2023-03-03 DIAGNOSIS — N838 Other noninflammatory disorders of ovary, fallopian tube and broad ligament: Secondary | ICD-10-CM | POA: Insufficient documentation

## 2023-03-03 DIAGNOSIS — S299XXA Unspecified injury of thorax, initial encounter: Secondary | ICD-10-CM | POA: Diagnosis not present

## 2023-03-03 DIAGNOSIS — I2510 Atherosclerotic heart disease of native coronary artery without angina pectoris: Secondary | ICD-10-CM | POA: Diagnosis not present

## 2023-03-03 DIAGNOSIS — S3991XA Unspecified injury of abdomen, initial encounter: Secondary | ICD-10-CM | POA: Diagnosis not present

## 2023-03-03 DIAGNOSIS — F411 Generalized anxiety disorder: Secondary | ICD-10-CM | POA: Diagnosis present

## 2023-03-03 DIAGNOSIS — I6782 Cerebral ischemia: Secondary | ICD-10-CM | POA: Diagnosis not present

## 2023-03-03 DIAGNOSIS — S32010A Wedge compression fracture of first lumbar vertebra, initial encounter for closed fracture: Secondary | ICD-10-CM | POA: Diagnosis not present

## 2023-03-03 DIAGNOSIS — M545 Low back pain, unspecified: Secondary | ICD-10-CM | POA: Diagnosis not present

## 2023-03-03 DIAGNOSIS — E1169 Type 2 diabetes mellitus with other specified complication: Secondary | ICD-10-CM | POA: Diagnosis present

## 2023-03-03 DIAGNOSIS — S32020A Wedge compression fracture of second lumbar vertebra, initial encounter for closed fracture: Secondary | ICD-10-CM | POA: Insufficient documentation

## 2023-03-03 DIAGNOSIS — R079 Chest pain, unspecified: Secondary | ICD-10-CM | POA: Diagnosis not present

## 2023-03-03 DIAGNOSIS — S199XXA Unspecified injury of neck, initial encounter: Secondary | ICD-10-CM | POA: Diagnosis not present

## 2023-03-03 DIAGNOSIS — G319 Degenerative disease of nervous system, unspecified: Secondary | ICD-10-CM | POA: Diagnosis not present
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
## 2023-03-04 ENCOUNTER — Emergency Department (HOSPITAL_COMMUNITY): Payer: No Typology Code available for payment source

## 2023-03-04 DIAGNOSIS — K219 Gastro-esophageal reflux disease without esophagitis: Secondary | ICD-10-CM | POA: Diagnosis not present

## 2023-03-04 DIAGNOSIS — I10 Essential (primary) hypertension: Secondary | ICD-10-CM | POA: Diagnosis not present

## 2023-03-04 DIAGNOSIS — I6782 Cerebral ischemia: Secondary | ICD-10-CM | POA: Diagnosis not present

## 2023-03-04 DIAGNOSIS — E1169 Type 2 diabetes mellitus with other specified complication: Secondary | ICD-10-CM

## 2023-03-04 DIAGNOSIS — S32020A Wedge compression fracture of second lumbar vertebra, initial encounter for closed fracture: Secondary | ICD-10-CM | POA: Insufficient documentation

## 2023-03-04 DIAGNOSIS — F411 Generalized anxiety disorder: Secondary | ICD-10-CM

## 2023-03-04 DIAGNOSIS — J9601 Acute respiratory failure with hypoxia: Secondary | ICD-10-CM | POA: Insufficient documentation

## 2023-03-04 DIAGNOSIS — R52 Pain, unspecified: Secondary | ICD-10-CM | POA: Diagnosis not present

## 2023-03-04 DIAGNOSIS — E785 Hyperlipidemia, unspecified: Secondary | ICD-10-CM

## 2023-03-04 DIAGNOSIS — E039 Hypothyroidism, unspecified: Secondary | ICD-10-CM

## 2023-03-04 DIAGNOSIS — S299XXA Unspecified injury of thorax, initial encounter: Secondary | ICD-10-CM | POA: Diagnosis not present

## 2023-03-04 DIAGNOSIS — S0990XA Unspecified injury of head, initial encounter: Secondary | ICD-10-CM | POA: Diagnosis not present

## 2023-03-04 DIAGNOSIS — S32029A Unspecified fracture of second lumbar vertebra, initial encounter for closed fracture: Secondary | ICD-10-CM | POA: Diagnosis not present

## 2023-03-04 DIAGNOSIS — S3991XA Unspecified injury of abdomen, initial encounter: Secondary | ICD-10-CM | POA: Diagnosis not present

## 2023-03-04 DIAGNOSIS — S3993XA Unspecified injury of pelvis, initial encounter: Secondary | ICD-10-CM | POA: Diagnosis not present

## 2023-03-04 DIAGNOSIS — R079 Chest pain, unspecified: Secondary | ICD-10-CM | POA: Diagnosis not present

## 2023-03-04 DIAGNOSIS — G319 Degenerative disease of nervous system, unspecified: Secondary | ICD-10-CM | POA: Diagnosis not present

## 2023-03-04 DIAGNOSIS — S199XXA Unspecified injury of neck, initial encounter: Secondary | ICD-10-CM | POA: Diagnosis not present

## 2023-03-04 LAB — CBC
HCT: 36.7 % (ref 36.0–46.0)
Hemoglobin: 12.1 g/dL (ref 12.0–15.0)
MCH: 28.6 pg (ref 26.0–34.0)
MCHC: 33 g/dL (ref 30.0–36.0)
MCV: 86.8 fL (ref 80.0–100.0)
Platelets: 158 10*3/uL (ref 150–400)
RBC: 4.23 MIL/uL (ref 3.87–5.11)
RDW: 14.9 % (ref 11.5–15.5)
WBC: 8.3 10*3/uL (ref 4.0–10.5)
nRBC: 0 % (ref 0.0–0.2)

## 2023-03-04 LAB — COMPREHENSIVE METABOLIC PANEL
ALT: 17 U/L (ref 0–44)
ALT: 15 U/L (ref 0–44)
AST: 32 U/L (ref 15–41)
AST: 29 U/L (ref 15–41)
Albumin: 3.7 g/dL (ref 3.5–5.0)
Albumin: 3.8 g/dL (ref 3.5–5.0)
Alkaline Phosphatase: 76 U/L (ref 38–126)
Alkaline Phosphatase: 76 U/L (ref 38–126)
Anion gap: 11 (ref 5–15)
Anion gap: 8 (ref 5–15)
BUN: 22 mg/dL (ref 8–23)
BUN: 21 mg/dL (ref 8–23)
CO2: 22 mmol/L (ref 22–32)
CO2: 24 mmol/L (ref 22–32)
Calcium: 9 mg/dL (ref 8.9–10.3)
Calcium: 8.7 mg/dL — ABNORMAL LOW (ref 8.9–10.3)
Chloride: 105 mmol/L (ref 98–111)
Chloride: 105 mmol/L (ref 98–111)
Creatinine, Ser: 1.23 mg/dL — ABNORMAL HIGH (ref 0.44–1.00)
Creatinine, Ser: 1.09 mg/dL — ABNORMAL HIGH (ref 0.44–1.00)
GFR, Estimated: 44 mL/min — ABNORMAL LOW (ref >60)
GFR, Estimated: 51 mL/min — ABNORMAL LOW (ref >60)
Glucose, Bld: 219 mg/dL — ABNORMAL HIGH (ref 70–99)
Glucose, Bld: 122 mg/dL — ABNORMAL HIGH (ref 70–99)
Potassium: 3.5 mmol/L (ref 3.5–5.1)
Potassium: 3.9 mmol/L (ref 3.5–5.1)
Sodium: 138 mmol/L (ref 135–145)
Sodium: 137 mmol/L (ref 135–145)
Total Bilirubin: 0.7 mg/dL (ref 0.3–1.2)
Total Bilirubin: 0.7 mg/dL (ref 0.3–1.2)
Total Protein: 7.4 g/dL (ref 6.5–8.1)
Total Protein: 7.5 g/dL (ref 6.5–8.1)

## 2023-03-04 LAB — CBC WITH DIFFERENTIAL/PLATELET
Abs Immature Granulocytes: 0.03 10*3/uL (ref 0.00–0.07)
Basophils Absolute: 0 10*3/uL (ref 0.0–0.1)
Basophils Relative: 0 %
Eosinophils Absolute: 0.1 10*3/uL (ref 0.0–0.5)
Eosinophils Relative: 1 %
HCT: 38.2 % (ref 36.0–46.0)
Hemoglobin: 12.4 g/dL (ref 12.0–15.0)
Immature Granulocytes: 0 %
Lymphocytes Relative: 18 %
Lymphs Abs: 1.2 10*3/uL (ref 0.7–4.0)
MCH: 28.6 pg (ref 26.0–34.0)
MCHC: 32.5 g/dL (ref 30.0–36.0)
MCV: 88 fL (ref 80.0–100.0)
Monocytes Absolute: 0.4 10*3/uL (ref 0.1–1.0)
Monocytes Relative: 5 %
Neutro Abs: 5.1 10*3/uL (ref 1.7–7.7)
Neutrophils Relative %: 76 %
Platelets: 176 10*3/uL (ref 150–400)
RBC: 4.34 MIL/uL (ref 3.87–5.11)
RDW: 15 % (ref 11.5–15.5)
WBC: 6.8 10*3/uL (ref 4.0–10.5)
nRBC: 0 % (ref 0.0–0.2)

## 2023-03-04 LAB — PROTIME-INR
INR: 1.1 (ref 0.8–1.2)
Prothrombin Time: 14.4 seconds (ref 11.4–15.2)

## 2023-03-04 LAB — GLUCOSE, CAPILLARY
Glucose-Capillary: 129 mg/dL — ABNORMAL HIGH (ref 70–99)
Glucose-Capillary: 135 mg/dL — ABNORMAL HIGH (ref 70–99)
Glucose-Capillary: 139 mg/dL — ABNORMAL HIGH (ref 70–99)
Glucose-Capillary: 147 mg/dL — ABNORMAL HIGH (ref 70–99)

## 2023-03-04 LAB — MAGNESIUM: Magnesium: 2.1 mg/dL (ref 1.7–2.4)

## 2023-03-04 MED ORDER — OXYCODONE HCL 5 MG PO TABS
10.0000 mg | ORAL_TABLET | ORAL | Status: DC | PRN
  Administered 2023-03-04 – 2023-03-05 (×3): 10 mg via ORAL
  Filled 2023-03-04 (×3): qty 2

## 2023-03-04 MED ORDER — MESALAMINE 1.2 G PO TBEC
2.4000 g | DELAYED_RELEASE_TABLET | Freq: Every day | ORAL | Status: DC
  Administered 2023-03-04 – 2023-03-05 (×2): 2.4 g via ORAL
  Filled 2023-03-04 (×3): qty 2

## 2023-03-04 MED ORDER — ONDANSETRON HCL 4 MG/2ML IJ SOLN: 4.0000 mg | Freq: Four times a day (QID) | INTRAMUSCULAR | Status: DC | PRN

## 2023-03-04 MED ORDER — HEPARIN SODIUM (PORCINE) 5000 UNIT/ML IJ SOLN
5000.0000 [IU] | Freq: Three times a day (TID) | INTRAMUSCULAR | Status: DC
  Administered 2023-03-04 – 2023-03-05 (×4): 5000 [IU] via SUBCUTANEOUS
  Filled 2023-03-04 (×4): qty 1

## 2023-03-04 MED ORDER — TRAZODONE HCL 50 MG PO TABS
50.0000 mg | ORAL_TABLET | Freq: Every day | ORAL | Status: DC
  Administered 2023-03-04: 50 mg via ORAL
  Filled 2023-03-04: qty 1

## 2023-03-04 MED ORDER — LEVOTHYROXINE SODIUM 125 MCG PO TABS
125.0000 ug | ORAL_TABLET | Freq: Every day | ORAL | Status: DC
  Administered 2023-03-04 – 2023-03-05 (×2): 125 ug via ORAL
  Filled 2023-03-04 (×2): qty 1

## 2023-03-04 MED ORDER — SERTRALINE HCL 50 MG PO TABS
75.0000 mg | ORAL_TABLET | Freq: Every day | ORAL | Status: DC
  Administered 2023-03-04: 75 mg via ORAL
  Filled 2023-03-04: qty 2

## 2023-03-04 MED ORDER — METHOCARBAMOL 500 MG PO TABS
500.0000 mg | ORAL_TABLET | Freq: Two times a day (BID) | ORAL | Status: AC
  Administered 2023-03-04 (×2): 500 mg via ORAL
  Filled 2023-03-04 (×2): qty 1

## 2023-03-04 MED ORDER — DIAZEPAM 5 MG PO TABS: 5.0000 mg | ORAL_TABLET | Freq: Four times a day (QID) | ORAL | Status: DC | PRN

## 2023-03-04 MED ORDER — ACETAMINOPHEN 325 MG PO TABS: 650.0000 mg | ORAL_TABLET | Freq: Four times a day (QID) | ORAL | Status: DC | PRN

## 2023-03-04 MED ORDER — INSULIN ASPART 100 UNIT/ML IJ SOLN: 0.0000 [IU] | Freq: Three times a day (TID) | INTRAMUSCULAR | Status: DC

## 2023-03-04 MED ORDER — INSULIN ASPART 100 UNIT/ML IJ SOLN: 0.0000 [IU] | Freq: Every day | INTRAMUSCULAR | Status: DC

## 2023-03-04 MED ORDER — MORPHINE SULFATE (PF) 2 MG/ML IV SOLN: 2.0000 mg | INTRAVENOUS | Status: DC | PRN

## 2023-03-04 MED ORDER — DICLOFENAC SODIUM 75 MG PO TBEC: 75.0000 mg | DELAYED_RELEASE_TABLET | Freq: Two times a day (BID) | ORAL | Status: DC

## 2023-03-04 MED ORDER — GABAPENTIN 300 MG PO CAPS
300.0000 mg | ORAL_CAPSULE | Freq: Three times a day (TID) | ORAL | Status: DC
  Administered 2023-03-04 – 2023-03-05 (×4): 300 mg via ORAL
  Filled 2023-03-04 (×4): qty 1

## 2023-03-04 MED ORDER — HYDROMORPHONE HCL 1 MG/ML IJ SOLN
0.5000 mg | Freq: Once | INTRAMUSCULAR | Status: AC
  Administered 2023-03-04: 0.5 mg via INTRAVENOUS
  Filled 2023-03-04: qty 0.5

## 2023-03-04 MED ORDER — ACETAMINOPHEN 650 MG RE SUPP: 650.0000 mg | Freq: Four times a day (QID) | RECTAL | Status: DC | PRN

## 2023-03-04 MED ORDER — ROSUVASTATIN CALCIUM 10 MG PO TABS
5.0000 mg | ORAL_TABLET | Freq: Every day | ORAL | Status: DC
  Administered 2023-03-04 – 2023-03-05 (×2): 5 mg via ORAL
  Filled 2023-03-04 (×2): qty 1

## 2023-03-04 MED ORDER — IOHEXOL 300 MG/ML  SOLN
75.0000 mL | Freq: Once | INTRAMUSCULAR | Status: AC | PRN
  Administered 2023-03-04: 75 mL via INTRAVENOUS

## 2023-03-04 MED ORDER — PANTOPRAZOLE SODIUM 40 MG PO TBEC
40.0000 mg | DELAYED_RELEASE_TABLET | Freq: Two times a day (BID) | ORAL | Status: DC
  Administered 2023-03-04 – 2023-03-05 (×3): 40 mg via ORAL
  Filled 2023-03-04 (×3): qty 1

## 2023-03-04 MED ORDER — NIFEDIPINE ER OSMOTIC RELEASE 30 MG PO TB24
30.0000 mg | ORAL_TABLET | Freq: Every day | ORAL | Status: DC
  Administered 2023-03-04 – 2023-03-05 (×2): 30 mg via ORAL
  Filled 2023-03-04 (×2): qty 1

## 2023-03-04 MED ORDER — ONDANSETRON HCL 4 MG PO TABS: 4.0000 mg | ORAL_TABLET | Freq: Four times a day (QID) | ORAL | Status: DC | PRN

## 2023-03-04 NOTE — Progress Notes (Signed)
Orthopedic Tech Progress Note Patient Details:  Cynthia Mays October 18, 1940 [id number]  Ortho Devices Type of Ortho Device: Lumbar corsett Ortho Device/Splint Location: Back Ortho Device/Splint Interventions: Ordered      Al Decant 03/04/2023, 4:06 AM
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
## 2023-03-04 NOTE — Assessment & Plan Note (Signed)
Continue Crestor 
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
## 2023-03-04 NOTE — Assessment & Plan Note (Signed)
Continue Synthroid °
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
## 2023-03-04 NOTE — Assessment & Plan Note (Signed)
-   Continue Valium from PTA medication list - Continue SSRI

## 2023-03-04 NOTE — Plan of Care (Signed)
  Problem: Acute Rehab PT Goals(only PT should resolve) Goal: Pt Will Go Supine/Side To Sit Outcome: Progressing Flowsheets (Taken 03/04/2023 1234) Pt will go Supine/Side to Sit: with supervision Goal: Patient Will Transfer Sit To/From Stand Outcome: Progressing Flowsheets (Taken 03/04/2023 1234) Patient will transfer sit to/from stand: with supervision Goal: Pt Will Transfer Bed To Chair/Chair To Bed Outcome: Progressing Flowsheets (Taken 03/04/2023 1234) Pt will Transfer Bed to Chair/Chair to Bed:  with modified independence  with supervision Goal: Pt Will Ambulate Outcome: Progressing Flowsheets (Taken 03/04/2023 1234) Pt will Ambulate:  75 feet  with supervision  with rolling walker   12:35 PM, 03/04/23 Ocie Bob, MPT Physical Therapist with [facility] 336 [phone] office [phone] mobile phone

## 2023-03-04 NOTE — Assessment & Plan Note (Signed)
Continue Protonix °
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
## 2023-03-04 NOTE — Assessment & Plan Note (Signed)
-   Continue Procardia

## 2023-03-04 NOTE — ED Notes (Signed)
Ortho called regarding pt's LSO brace

## 2023-03-04 NOTE — Progress Notes (Signed)
Patient admitted to 300 floor form ED.  Patient took medication without any issue

## 2023-03-04 NOTE — Progress Notes (Signed)
Orthopedic Tech Progress Note Patient Details:  Cynthia Mays [birth year] [id number]  HANGER PERSONNEL needed to know where patient was since she was no longer in the ED Patient ID: Cynthia Mays, female   DOB: [birth year], 82 y.o.   MRN: [id number]  Cynthia Mays 03/04/2023, 7:50 AM

## 2023-03-04 NOTE — ED Notes (Signed)
Took patient to restroom via wheelchair, and patient came back and stated her pain was a 8/10. Patient asked for pain meds EDP aware.

## 2023-03-04 NOTE — Assessment & Plan Note (Signed)
-   Patient required 2 L nasal cannula after being given opiate pain medication in the ER - CT chest she does have bilateral pulmonary nodules with recommendation for repeat CT in 6-12 months
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
## 2023-03-05 DIAGNOSIS — S32029A Unspecified fracture of second lumbar vertebra, initial encounter for closed fracture: Secondary | ICD-10-CM | POA: Diagnosis not present

## 2023-03-05 DIAGNOSIS — R52 Pain, unspecified: Secondary | ICD-10-CM | POA: Diagnosis not present

## 2023-03-05 LAB — GLUCOSE, CAPILLARY
Glucose-Capillary: 121 mg/dL — ABNORMAL HIGH (ref 70–99)
Glucose-Capillary: 148 mg/dL — ABNORMAL HIGH (ref 70–99)

## 2023-03-05 MED ORDER — SENNOSIDES-DOCUSATE SODIUM 8.6-50 MG PO TABS
1.0000 | ORAL_TABLET | Freq: Every day | ORAL | Status: DC
  Administered 2023-03-05: 1 via ORAL
  Filled 2023-03-05: qty 1

## 2023-03-05 MED ORDER — POLYETHYLENE GLYCOL 3350 17 G PO PACK
17.0000 g | PACK | Freq: Every day | ORAL | Status: DC
  Administered 2023-03-05: 17 g via ORAL
  Filled 2023-03-05: qty 1

## 2023-03-05 MED ORDER — OXYCODONE HCL 5 MG PO TABS: 5.0000 mg | ORAL_TABLET | Freq: Four times a day (QID) | ORAL | 0 refills | Status: AC | PRN

## 2023-03-05 NOTE — TOC Transition Note (Addendum)
Transition of Care [facility]) - CM/SW Discharge Note   Patient Details  Name: Cynthia Mays MRN: [id number] Date of Birth: 1941/03/10  Transition of Care [facility]) CM/SW Contact:  Elliot Gault, LCSW Phone Number: 03/05/2023, 12:49 PM   Clinical Narrative:     Per MD, pt may dc later today or tomorrow. Plan remains for dc home with [facility] to start next week. Messaged MD to request HHPT order be placed prior to dc.  Updated [name] at [facility] and they will follow up after dc as planned.  No other TOC needs identified.  Final next level of care: Home w Home Health Services Barriers to Discharge: Barriers Resolved   Patient Goals and CMS Choice   Choice offered to / list presented to : Adult Children  Discharge Placement                         Discharge Plan and Services Additional resources added to the After Visit Summary for   In-house Referral: Clinical Social Work   Post Acute Care Choice: Home Health                    HH Arranged: PT [facility] Agency: [facility] Date [facility] Agency Contacted: 03/04/23 Time HH Agency Contacted: 1151 Representative spoke with at [facility] Agency: [name]  Social Determinants of Health (SDOH) Interventions SDOH Screenings   Food Insecurity: No Food Insecurity (03/04/2023)  Housing: Low Risk  (03/04/2023)  Transportation Needs: No Transportation Needs (03/04/2023)  Utilities: Not At Risk (03/04/2023)  Alcohol Screen: Low Risk  (12/04/2022)  Depression (PHQ2-9): Medium Risk (02/23/2023)  Financial Resource Strain: Low Risk  (12/04/2022)  Physical Activity: Insufficiently Active (12/04/2022)  Social Connections: Socially Isolated (12/04/2022)  Stress: No Stress Concern Present (12/04/2022)  Tobacco Use: Medium Risk (03/03/2023)     Readmission Risk Interventions     No data to display
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
## 2023-03-09 ENCOUNTER — Telehealth: Payer: Self-pay | Admitting: Family
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
## 2023-03-09 NOTE — Telephone Encounter (Signed)
Appointment made

## 2023-03-09 NOTE — Telephone Encounter (Signed)
PT needs to be seen in person or video visit for hospital follow up.   [name], FNP

## 2023-03-13 ENCOUNTER — Telehealth (INDEPENDENT_AMBULATORY_CARE_PROVIDER_SITE_OTHER): Payer: 59 | Admitting: Family

## 2023-03-13 ENCOUNTER — Inpatient Hospital Stay: Payer: 59

## 2023-03-13 ENCOUNTER — Encounter: Payer: Self-pay | Admitting: Family

## 2023-03-13 DIAGNOSIS — R911 Solitary pulmonary nodule: Secondary | ICD-10-CM

## 2023-03-13 DIAGNOSIS — R42 Dizziness and giddiness: Secondary | ICD-10-CM | POA: Diagnosis not present

## 2023-03-13 DIAGNOSIS — S32020G Wedge compression fracture of second lumbar vertebra, subsequent encounter for fracture with delayed healing: Secondary | ICD-10-CM

## 2023-03-13 DIAGNOSIS — N839 Noninflammatory disorder of ovary, fallopian tube and broad ligament, unspecified: Secondary | ICD-10-CM

## 2023-03-13 DIAGNOSIS — I10 Essential (primary) hypertension: Secondary | ICD-10-CM

## 2023-03-13 MED ORDER — HYDROCODONE-ACETAMINOPHEN 5-325 MG PO TABS
1.0000 | ORAL_TABLET | Freq: Four times a day (QID) | ORAL | 0 refills | Status: DC | PRN
Start: 2023-03-13 — End: 2023-05-26

## 2023-03-13 MED ORDER — MECLIZINE HCL 12.5 MG PO TABS
12.5000 mg | ORAL_TABLET | Freq: Three times a day (TID) | ORAL | 1 refills | Status: DC | PRN
Start: 2023-03-13 — End: 2023-06-29

## 2023-03-13 MED ORDER — NIFEDIPINE ER OSMOTIC RELEASE 30 MG PO TB24: 30.0000 mg | ORAL_TABLET | Freq: Every day | ORAL | 3 refills | Status: DC
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
## 2023-03-26 ENCOUNTER — Other Ambulatory Visit: Payer: Self-pay | Admitting: Obstetrics & Gynecology

## 2023-03-26 ENCOUNTER — Other Ambulatory Visit (INDEPENDENT_AMBULATORY_CARE_PROVIDER_SITE_OTHER): Payer: Self-pay | Admitting: Gastroenterology

## 2023-03-26 NOTE — Telephone Encounter (Signed)
Needs ov not seen since 2022.

## 2023-04-03 DIAGNOSIS — S32020D Wedge compression fracture of second lumbar vertebra, subsequent encounter for fracture with routine healing: Secondary | ICD-10-CM | POA: Diagnosis not present

## 2023-04-14 ENCOUNTER — Other Ambulatory Visit: Payer: Self-pay | Admitting: Family

## 2023-04-14 DIAGNOSIS — E1169 Type 2 diabetes mellitus with other specified complication: Secondary | ICD-10-CM

## 2023-04-27 ENCOUNTER — Other Ambulatory Visit: Payer: Self-pay | Admitting: Family

## 2023-04-27 DIAGNOSIS — F411 Generalized anxiety disorder: Secondary | ICD-10-CM

## 2023-04-27 DIAGNOSIS — G4700 Insomnia, unspecified: Secondary | ICD-10-CM

## 2023-05-07 ENCOUNTER — Other Ambulatory Visit: Payer: Self-pay | Admitting: Family

## 2023-05-07 MED ORDER — PANTOPRAZOLE SODIUM 40 MG PO TBEC: 40.0000 mg | DELAYED_RELEASE_TABLET | Freq: Two times a day (BID) | ORAL | 0 refills | Status: DC

## 2023-05-07 NOTE — Telephone Encounter (Signed)
LMOVM refill sent to pharmacy 

## 2023-05-07 NOTE — Telephone Encounter (Signed)
  Prescription Request  05/07/2023  Is this a "Controlled Substance" medicine? pantoprazole (PROTONIX) 40 MG tablet   Have you seen your PCP in the last 2 weeks? No. Pt is aware that med has not been prescribed since 2023 and we have to ask for refill. She is aware she may need to be seen.  If YES, route message to pool  -  If NO, patient needs to be scheduled for appointment.  What is the name of the medication or equipment? pantoprazole (PROTONIX) 40 MG tablet   Have you contacted your pharmacy to request a refill? no   Which pharmacy would you like this sent to? [facility]   Patient notified that their request is being sent to the clinical staff for review and that they should receive a response within 2 business days.

## 2023-05-21 DIAGNOSIS — M170 Bilateral primary osteoarthritis of knee: Secondary | ICD-10-CM | POA: Diagnosis not present

## 2023-05-21 DIAGNOSIS — M25562 Pain in left knee: Secondary | ICD-10-CM | POA: Diagnosis not present

## 2023-05-21 DIAGNOSIS — R262 Difficulty in walking, not elsewhere classified: Secondary | ICD-10-CM | POA: Diagnosis not present

## 2023-05-21 DIAGNOSIS — M25561 Pain in right knee: Secondary | ICD-10-CM | POA: Diagnosis not present

## 2023-05-26 ENCOUNTER — Ambulatory Visit: Payer: 59 | Admitting: Family

## 2023-05-26 ENCOUNTER — Other Ambulatory Visit: Payer: Self-pay | Admitting: Family

## 2023-05-26 ENCOUNTER — Encounter: Payer: Self-pay | Admitting: Family

## 2023-05-26 ENCOUNTER — Other Ambulatory Visit (INDEPENDENT_AMBULATORY_CARE_PROVIDER_SITE_OTHER): Payer: 59

## 2023-05-26 VITALS — BP 136/75 | HR 53 | Temp 97.8°F | Ht 59.0 in | Wt 173.0 lb

## 2023-05-26 DIAGNOSIS — M8589 Other specified disorders of bone density and structure, multiple sites: Secondary | ICD-10-CM | POA: Diagnosis not present

## 2023-05-26 DIAGNOSIS — Z79899 Other long term (current) drug therapy: Secondary | ICD-10-CM

## 2023-05-26 DIAGNOSIS — K219 Gastro-esophageal reflux disease without esophagitis: Secondary | ICD-10-CM | POA: Diagnosis not present

## 2023-05-26 DIAGNOSIS — E785 Hyperlipidemia, unspecified: Secondary | ICD-10-CM | POA: Diagnosis not present

## 2023-05-26 DIAGNOSIS — E039 Hypothyroidism, unspecified: Secondary | ICD-10-CM

## 2023-05-26 DIAGNOSIS — I10 Essential (primary) hypertension: Secondary | ICD-10-CM

## 2023-05-26 DIAGNOSIS — Z780 Asymptomatic menopausal state: Secondary | ICD-10-CM

## 2023-05-26 DIAGNOSIS — G4700 Insomnia, unspecified: Secondary | ICD-10-CM

## 2023-05-26 DIAGNOSIS — Z23 Encounter for immunization: Secondary | ICD-10-CM | POA: Diagnosis not present

## 2023-05-26 DIAGNOSIS — F411 Generalized anxiety disorder: Secondary | ICD-10-CM | POA: Diagnosis not present

## 2023-05-26 DIAGNOSIS — S32020A Wedge compression fracture of second lumbar vertebra, initial encounter for closed fracture: Secondary | ICD-10-CM

## 2023-05-26 DIAGNOSIS — E1169 Type 2 diabetes mellitus with other specified complication: Secondary | ICD-10-CM

## 2023-05-26 DIAGNOSIS — S32020G Wedge compression fracture of second lumbar vertebra, subsequent encounter for fracture with delayed healing: Secondary | ICD-10-CM

## 2023-05-26 LAB — CMP14+EGFR
ALT: 11 IU/L (ref 0–32)
AST: 16 IU/L (ref 0–40)
Albumin: 4.4 g/dL (ref 3.7–4.7)
Alkaline Phosphatase: 94 IU/L (ref 44–121)
BUN/Creatinine Ratio: 18 (ref 12–28)
BUN: 18 mg/dL (ref 8–27)
Bilirubin Total: 0.4 mg/dL (ref 0.0–1.2)
CO2: 21 mmol/L (ref 20–29)
Calcium: 9.7 mg/dL (ref 8.7–10.3)
Chloride: 106 mmol/L (ref 96–106)
Creatinine, Ser: 1.01 mg/dL — ABNORMAL HIGH (ref 0.57–1.00)
Globulin, Total: 3.3 g/dL (ref 1.5–4.5)
Glucose: 135 mg/dL — ABNORMAL HIGH (ref 70–99)
Potassium: 4.3 mmol/L (ref 3.5–5.2)
Sodium: 142 mmol/L (ref 134–144)
Total Protein: 7.7 g/dL (ref 6.0–8.5)
eGFR: 56 mL/min/{1.73_m2} — ABNORMAL LOW (ref >59)

## 2023-05-26 LAB — BAYER DCA HB A1C WAIVED: HB A1C (BAYER DCA - WAIVED): 6.2 % — ABNORMAL HIGH (ref 4.8–5.6)

## 2023-05-26 MED ORDER — HYDROCODONE-ACETAMINOPHEN 5-325 MG PO TABS
1.0000 | ORAL_TABLET | Freq: Four times a day (QID) | ORAL | 0 refills | Status: DC | PRN
Start: 2023-05-26 — End: 2023-09-21

## 2023-05-26 MED ORDER — DIAZEPAM 5 MG PO TABS
5.0000 mg | ORAL_TABLET | Freq: Four times a day (QID) | ORAL | 2 refills | Status: DC | PRN
Start: 2023-05-26 — End: 2023-09-21

## 2023-05-26 MED ORDER — HYDROCODONE-ACETAMINOPHEN 5-325 MG PO TABS
1.0000 | ORAL_TABLET | Freq: Four times a day (QID) | ORAL | 0 refills | Status: DC | PRN
Start: 2023-07-22 — End: 2023-09-21

## 2023-05-26 MED ORDER — HYDROCODONE-ACETAMINOPHEN 5-325 MG PO TABS
1.0000 | ORAL_TABLET | Freq: Four times a day (QID) | ORAL | 0 refills | Status: DC | PRN
Start: 2023-06-22 — End: 2023-09-21
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
## 2023-05-26 NOTE — Patient Instructions (Signed)
Spinal Compression Fracture  A spinal compression fracture is a collapse of the bones that form the spine (vertebrae). With this type of fracture, the vertebrae become pushed (compressed) into a wedge shape. Most compression fractures happen in the middle or lower part of the spine. What are the causes? This condition may be caused by: Thinning and loss of density in the bones (osteoporosis). This is the most common cause. A fall. A car or motorcycle accident. Cancer. Trauma, such as a heavy, direct hit to the head or back. What increases the risk? You are more likely to develop this condition if: You are 60 years of age or older. You have osteoporosis. You have certain types of cancer, including: Multiple myeloma. Lymphoma. Prostate cancer. Lung cancer. Breast cancer. What are the signs or symptoms? Symptoms of this condition include: Severe pain with simple movements such as coughing or sneezing. Pain that gets worse over time. Pain that is worse when you stand, walk, sit, or bend. Sudden pain that is so bad that it is hard for you to move. Bending or humping of the spine. Gradual loss of height. Numbness, tingling, or weakness in the back and legs. Trouble walking. Your symptoms will depend on the cause of the fracture and how quickly it develops. How is this diagnosed? This condition may be diagnosed based on symptoms, medical history, and a physical exam. During the physical exam, your health care provider may tap along the length of your spine to check for tenderness. Tests may be done to confirm the diagnosis. They may include: A bone mineral density test to check for osteoporosis. Imaging tests, such as a spine X-ray, CT scan, or MRI. How is this treated? Treatment depends on the cause and severity of the condition. Some fractures may heal on their own with supportive care. Treatment may include: Pain medicine. Rest. A back brace. Physical therapy exercises. Medicine  to strengthen bone. Calcium and vitamin D supplements. Fractures that cause the back to become misshapen, cause nerve pain or weakness, or do not respond to other treatment may be treated with surgery. This may include: Vertebroplasty. Bone cement is injected into the collapsed vertebrae to stabilize them. Balloon kyphoplasty. The collapsed vertebrae are expanded with a balloon and then bone cement is injected into them. Spinal fusion. The collapsed vertebrae are connected (fused) to normal vertebrae. Follow these instructions at home: Medicines Take over-the-counter and prescription medicines only as told by your health care provider. Ask your health care provider if the medicine prescribed to you: Requires you to avoid driving or using machinery. Can cause constipation. You may need to take these actions to prevent or treat constipation: Drink enough fluid to keep your urine pale yellow. Take over-the-counter or prescription medicines. Eat foods that are high in fiber, such as beans, whole grains, and fresh fruits and vegetables. Limit foods that are high in fat and processed sugars, such as fried or sweet foods. If you have a brace: Wear the brace as told by your health care provider. Remove it only as told by your health care provider. Loosen the brace if your fingers or toes tingle, become numb, or turn cold and blue. Keep the brace clean. If the brace is not waterproof: Do not let it get wet. Cover it with a watertight covering when you take a bath or a shower. Managing pain, stiffness, and swelling  If directed, put ice on the injured area. To do this: If you have a removable brace, remove it as  told by your health care provider. Put ice in a plastic bag. Place a towel between your skin and the bag. Leave the ice on for 20 minutes, 2-3 times a day. Remove the ice if your skin turns bright red. This is very important. If you cannot feel pain, heat, or cold, you have a greater risk  of damage to the area. Activity Rest as told by your health care provider. Avoid sitting for a long time without moving. Get up to take short walks every 1-2 hours. This is important to improve blood flow and breathing. Ask for help if you feel weak or unsteady. Return to your normal activities as told by your health care provider. Ask what activities are safe for you. Do physical therapy exercises to improve movement and strength in your back, as recommended by your health care provider. Exercise regularly as directed by your health care provider. General instructions  Do not drink alcohol. Alcohol can interfere with your treatment. Do not use any products that contain nicotine or tobacco, such as cigarettes, e-cigarettes, and chewing tobacco. These can delay bone healing. If you need help quitting, ask your health care provider. Keep all follow-up visits. This is important. It can help to prevent permanent injury, disability, and long-lasting (chronic) pain. Contact a health care provider if: You have a fever. Your pain medicine is not helping. Your pain does not get better over time. You cannot return to your normal activities as planned or expected. Get help right away if: Your pain is very bad and it suddenly gets worse. You are unable to move any body part (paralysis) that is below the level of your injury. You have numbness, tingling, or weakness in any body part that is below the level of your injury. You cannot control your bladder or bowels. Summary A spinal compression fracture is a collapse of the bones that form the spine (vertebrae). With this type of fracture, the vertebrae become pushed (compressed) into a wedge shape. Your symptoms and treatment will depend on the cause and severity of the fracture and how quickly it develops. Some fractures may heal on their own with supportive care. Fractures that cause the back to become misshapen, cause nerve pain or weakness, or do not  respond to other treatment may be treated with surgery. This information is not intended to replace advice given to you by your health care provider. Make sure you discuss any questions you have with your health care provider. Document Revised: 12/07/2019 Document Reviewed: 12/07/2019 Elsevier Patient Education  2024 [employer].

## 2023-05-27 LAB — MICROALBUMIN / CREATININE URINE RATIO
Creatinine, Urine: 74.4 mg/dL
Microalb/Creat Ratio: 18 mg/g creat (ref 0–29)
Microalbumin, Urine: 13.6 ug/mL

## 2023-06-02 DIAGNOSIS — M170 Bilateral primary osteoarthritis of knee: Secondary | ICD-10-CM | POA: Diagnosis not present

## 2023-06-27 ENCOUNTER — Other Ambulatory Visit: Payer: Self-pay | Admitting: Family

## 2023-06-27 DIAGNOSIS — R42 Dizziness and giddiness: Secondary | ICD-10-CM

## 2023-07-07 DIAGNOSIS — S32020D Wedge compression fracture of second lumbar vertebra, subsequent encounter for fracture with routine healing: Secondary | ICD-10-CM | POA: Diagnosis not present

## 2023-07-09 DIAGNOSIS — M170 Bilateral primary osteoarthritis of knee: Secondary | ICD-10-CM | POA: Diagnosis not present

## 2023-07-18 NOTE — Progress Notes (Signed)
Cynthia Mays M. Elanna Bert, MD [facility] [email or web address]  

## 2023-07-20 ENCOUNTER — Other Ambulatory Visit: Payer: Self-pay

## 2023-07-20 ENCOUNTER — Ambulatory Visit: Payer: 59 | Attending: Student

## 2023-07-20 DIAGNOSIS — M62830 Muscle spasm of back: Secondary | ICD-10-CM | POA: Insufficient documentation

## 2023-07-20 DIAGNOSIS — M5459 Other low back pain: Secondary | ICD-10-CM | POA: Insufficient documentation

## 2023-07-20 DIAGNOSIS — M6281 Muscle weakness (generalized): Secondary | ICD-10-CM | POA: Insufficient documentation
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
## 2023-07-23 ENCOUNTER — Ambulatory Visit: Payer: 59

## 2023-07-23 DIAGNOSIS — M5459 Other low back pain: Secondary | ICD-10-CM

## 2023-07-23 DIAGNOSIS — M6281 Muscle weakness (generalized): Secondary | ICD-10-CM

## 2023-07-23 DIAGNOSIS — M62830 Muscle spasm of back: Secondary | ICD-10-CM | POA: Diagnosis not present

## 2023-07-23 NOTE — Therapy (Signed)
OUTPATIENT PHYSICAL THERAPY THORACOLUMBAR TREATMENT   Patient Name: Cynthia Mays MRN: [id number] DOB:04/17/1941, 82 y.o., female Today's Date: 07/23/2023  END OF SESSION:  PT End of Session - 07/23/23 0940     Visit Number 2    Number of Visits 12    Date for PT Re-Evaluation 10/02/23    PT Start Time 0931    PT Stop Time 1027    PT Time Calculation (min) 56 min    Activity Tolerance Patient limited by pain    Behavior During Therapy [facility] for tasks assessed/performed              Past Medical History:  Diagnosis Date   Complication of anesthesia    Depression    Diabetes mellitus    Fibromyalgia    GERD (gastroesophageal reflux disease)    Hiatal hernia    Hyperlipidemia    Hypertension    Hypothyroidism    Hypothyroidism 07/23/2016   PONV (postoperative nausea and vomiting)    Past Surgical History:  Procedure Laterality Date   APPENDECTOMY  2002   arthroscopy  right 2002, left 2007   bilateral knees   BACK SURGERY  10/2007, 09/2008   x2   BIOPSY  10/02/2016   Procedure: BIOPSY;  Surgeon: Malissa Hippo, MD;  [facility];  Service: Endoscopy;;  gastric   BIOPSY  05/04/2020   Procedure: BIOPSY;  Surgeon: Dolores Frame, MD;  [facility]: [facility];  Service: Gastroenterology;;   CARPAL TUNNEL RELEASE  1986   bilateral   CESAREAN SECTION  1969, 1967   x2   COLONOSCOPY WITH PROPOFOL N/A 05/04/2020   Castaneda:The examined portion of the ileum was normal(focally active, nonspecific ileitis, neg for features of chronicity or granulomas)- A single (solitary) ulcer in the cecum.(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 tortuous colon, non bleeding internal hemorrhoids   ESOPHAGOGASTRODUODENOSCOPY N/A 10/02/2016   Procedure: ESOPHAGOGASTRODUODENOSCOPY (EGD);  Surgeon: Malissa Hippo, MD;  [facility];  Service:  Endoscopy;  Laterality: N/A;  11:15 - moved to 2/1 @ 3:00   ESOPHAGOGASTRODUODENOSCOPY (EGD) WITH PROPOFOL N/A 05/04/2020   Castaneda: normal esophagus, 4cm hh with four cameron ulcers, multiple gastric polyps-fundic gland polyps, erythematous mucosa in pylorus, gastric antral mucosa and oxyntic mucosa with mild chronic gastritis and reactive changes, neg h pylori, normal examined duodenum   KNEE SURGERY     both   Patient Active Problem List   Diagnosis Date Noted   Intractable pain 03/04/2023   Closed compression fracture of L2 lumbar vertebra, initial encounter (HCC) 03/04/2023   Controlled substance agreement signed 08/15/2022   History of lumbar fusion 12/02/2021   Insomnia 05/14/2021   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   Colonic ulcer 08/13/2020   Obesity (BMI 30-39.9) 03/31/2019   Chronic use of benzodiazepine for therapeutic purpose 01/26/2018   Hiatal hernia 10/29/2017   Hyperlipidemia associated with type 2 diabetes mellitus (HCC) 12/31/2015   Coronary artery disease due to lipid rich plaque 12/31/2015   Left knee pain 10/24/2015   Macular degeneration, 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   Essential hypertension 02/20/2015   Chronic low back pain with sciatica 02/20/2015   Gastroesophageal reflux disease without esophagitis 11/20/2014   Anxiety, generalized 11/20/2014   Hypothyroidism 12/27/2013   DM (diabetes mellitus) (HCC) 12/27/2013   Precordial pain 01/15/2013   REFERRING PROVIDER: Floreen Comber, NP   REFERRING DIAG: Wedge compression fracture of second  lumbar vertebra, subsequent encounter for fracture with routine healing   Rationale for Evaluation and Treatment: Rehabilitation  THERAPY DIAG:  Other low back pain  Muscle weakness (generalized)  ONSET DATE: 03/03/23  SUBJECTIVE:                                                                                                                                                                                            SUBJECTIVE STATEMENT: Patient reports that her back is still hurting today. She tried digging weeds in her garden yesterday which really bothered her back last night.   PERTINENT HISTORY:  Hypertension, diabetes, anxiety, history of a lumbar fusion, depression, and fibromyalgia  PAIN:  Are you having pain? Yes: NPRS scale: 5/10 Pain location: left low back  Pain description: constant sharp, burning knife Aggravating factors: bending, any lifting, driving, and stairs Relieving factors: medication and not moving  PRECAUTIONS: Fall  RED FLAGS: None   WEIGHT BEARING RESTRICTIONS: No  FALLS:  Has patient fallen in last 6 months? Yes. Number of falls 1  LIVING ENVIRONMENT: Lives with: lives alone Lives in: House/apartment Stairs: Yes: Internal: 22 steps; on right going up and External: 3 steps; on right going up Has following equipment at home: None  OCCUPATION: retired  PLOF: Independent  PATIENT GOALS: reduced pain, and be able to do her yard work  NEXT MD VISIT: none scheduled  OBJECTIVE:  Note: Objective measures were completed at Evaluation unless otherwise noted.  PATIENT SURVEYS:  FOTO 36.20  SCREENING FOR RED FLAGS: Bowel or bladder incontinence: No Spinal tumors: No Cauda equina syndrome: No Compression fracture: No Abdominal aneurysm: No  COGNITION: Overall cognitive status: Within functional limits for tasks assessed     SENSATION: Patient reports no numbness or tingling.   POSTURE: rounded shoulders, forward head, decreased lumbar lordosis, increased thoracic kyphosis, posterior pelvic tilt, and flexed trunk   PALPATION: TTP: right QL, and left lumbar paraspinals (reproduced familiar pain)  LUMBAR ROM:   AROM eval  Flexion 8; limited by familiar pain  Extension 6; limited by familiar pain  Right lateral flexion   Left lateral flexion   Right rotation 75% limited   Left rotation 90% limited; familiar pain   (Blank rows = not  tested)  LOWER EXTREMITY ROM: WFL for activities assessed  LOWER EXTREMITY MMT:    MMT Right eval Left eval  Hip flexion 3+/5 3/5; limited by pain  Hip extension    Hip abduction    Hip adduction    Hip internal rotation    Hip external rotation    Knee flexion 4/5 3+/5  Knee extension 4-/5 3/5;  limited by pain  Ankle dorsiflexion 3+/5 3+/5  Ankle plantarflexion    Ankle inversion    Ankle eversion     (Blank rows = not tested)  GAIT: Assistive device utilized: None Level of assistance: SBA Comments: flexed trunk with decreased gait speed and stride length   TODAY'S TREATMENT:                                                                                                                              DATE:                                     07/23/23 EXERCISE LOG  Exercise Repetitions and Resistance Comments  Nustep  L3 x 16 minutes     Blank cell = exercise not performed today  Manual Therapy Soft Tissue Mobilization: bilateral lumbar paraspinals and quadratus lumborum , for reduced pain and tone Joint Mobilizations: L3-5, CPA's grade I   Modalities: no redness or adverse reaction to today's modalities  Date:  Unattended Estim: Lumbar, IFC @ 80-150 Hz w/ 40% scan, 15 mins, Pain and Tone  PATIENT EDUCATION:  Education details: POC, prognosis, healing, and goals for therapy Person educated: Patient Education method: Explanation Education comprehension: verbalized understanding  HOME EXERCISE PROGRAM:   ASSESSMENT:  CLINICAL IMPRESSION: Today's treatment focused on light lumbar and lower extremity mobility followed by manual therapy for reduced pain and tone. Manual therapy focused on soft tissue mobilization to her bilateral lumbar paraspinals and grade I lumbar CPA's for reduced pain and tone with good effectiveness. She reported that her back felt better upon the conclusion of treatment. She continues to require skilled physical therapy to address her remaining  impairments to return to her prior level of function.   OBJECTIVE IMPAIRMENTS: Abnormal gait, decreased activity tolerance, decreased balance, decreased mobility, difficulty walking, decreased ROM, decreased strength, hypomobility, impaired tone, postural dysfunction, and pain.   ACTIVITY LIMITATIONS: carrying, lifting, bending, standing, squatting, sleeping, stairs, transfers, bed mobility, reach over head, and locomotion level  PARTICIPATION LIMITATIONS: meal prep, cleaning, laundry, driving, shopping, community activity, and yard work  PERSONAL FACTORS: Age, Past/current experiences, Time since onset of injury/illness/exacerbation, and 3+ comorbidities: Hypertension, diabetes, anxiety, history of a lumbar fusion, depression, and fibromyalgia  are also affecting patient's functional outcome.   REHAB POTENTIAL: Good  CLINICAL DECISION MAKING: Evolving/moderate complexity  EVALUATION COMPLEXITY: Moderate   GOALS: Goals reviewed with patient? Yes  SHORT TERM GOALS: Target date: 08/10/23  Patient will be independent with her initial HEP.  Baseline: Goal status: INITIAL  2.  Patient will be able to complete her daily activities without her familiar pain exceeding 7/10. Baseline:  Goal status: INITIAL  3.  Patient will improve her lumbar flexion to at least 15 degrees for improved function picking up items from the floor.  Baseline:  Goal status: INITIAL  4.  Patient will be  able to demonstrate at least 4-/5 muscular strength throughout her left lower extremity for improved function with her daily activities.  Baseline:  Goal status: INITIAL  LONG TERM GOALS: Target date: 08/31/23  Patient will be independent with her advanced HEP.  Baseline:  Goal status: INITIAL  2.  Patient will be able to complete her daily activities without her familiar pain exceeding 5/10. Baseline:  Goal status: INITIAL  3.  Patient will be able to pick up at least 5 pounds from the floor for  improved function picking up her groceries.  Baseline:  Goal status: INITIAL  4.  Patient will be able to navigate at least 3 steps with a reciprocal pattern for improved household independence.  Baseline:  Goal status: INITIAL  PLAN:  PT FREQUENCY: 2x/week  PT DURATION: 6 weeks  PLANNED INTERVENTIONS: 97164- PT Re-evaluation, 97110-Therapeutic exercises, 97530- Therapeutic activity, 97112- Neuromuscular re-education, 97535- Self Care, 91478- Manual therapy, [id number]- Gait training, 97014- Electrical stimulation (unattended), [phone]- Ultrasound, Patient/Family education, Balance training, Stair training, Spinal mobilization, Cryotherapy, and Moist heat.  PLAN FOR NEXT SESSION: Nustep, lumbar isometric, manual therapy, and modalities as needed   Granville Lewis, PT 07/23/2023, 10:38 AM

## 2023-07-28 ENCOUNTER — Other Ambulatory Visit: Payer: Self-pay | Admitting: Family

## 2023-07-28 DIAGNOSIS — E1169 Type 2 diabetes mellitus with other specified complication: Secondary | ICD-10-CM

## 2023-07-29 ENCOUNTER — Encounter: Payer: Self-pay | Admitting: *Deleted

## 2023-07-29 ENCOUNTER — Ambulatory Visit: Payer: 59 | Admitting: *Deleted

## 2023-07-29 DIAGNOSIS — M62830 Muscle spasm of back: Secondary | ICD-10-CM | POA: Diagnosis not present

## 2023-07-29 DIAGNOSIS — M6281 Muscle weakness (generalized): Secondary | ICD-10-CM

## 2023-07-29 DIAGNOSIS — M5459 Other low back pain: Secondary | ICD-10-CM

## 2023-07-29 NOTE — Therapy (Signed)
OUTPATIENT PHYSICAL THERAPY THORACOLUMBAR TREATMENT   Patient Name: Cynthia Mays MRN: [id number] DOB:Nov 03, 1940, 82 y.o., female Today's Date: 07/29/2023  END OF SESSION:  PT End of Session - 07/29/23 1026     Visit Number 3    Number of Visits 12    Date for PT Re-Evaluation 10/02/23    PT Start Time 1015    PT Stop Time 1105    PT Time Calculation (min) 50 min              Past Medical History:  Diagnosis Date   Complication of anesthesia    Depression    Diabetes mellitus    Fibromyalgia    GERD (gastroesophageal reflux disease)    Hiatal hernia    Hyperlipidemia    Hypertension    Hypothyroidism    Hypothyroidism 07/23/2016   PONV (postoperative nausea and vomiting)    Past Surgical History:  Procedure Laterality Date   APPENDECTOMY  2002   arthroscopy  right 2002, left 2007   bilateral knees   BACK SURGERY  10/2007, 09/2008   x2   BIOPSY  10/02/2016   Procedure: BIOPSY;  Surgeon: Malissa Hippo, MD;  [facility];  Service: Endoscopy;;  gastric   BIOPSY  05/04/2020   Procedure: BIOPSY;  Surgeon: Dolores Frame, MD;  [facility];  Service: Gastroenterology;;   CARPAL TUNNEL RELEASE  1986   bilateral   CESAREAN SECTION  1969, 1967   x2   COLONOSCOPY WITH PROPOFOL N/A 05/04/2020   Castaneda:The examined portion of the ileum was normal(focally active, nonspecific ileitis, neg for features of chronicity or granulomas)- A single (solitary) ulcer in the cecum.(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 tortuous colon, non bleeding internal hemorrhoids   ESOPHAGOGASTRODUODENOSCOPY N/A 10/02/2016   Procedure: ESOPHAGOGASTRODUODENOSCOPY (EGD);  Surgeon: Malissa Hippo, MD;  [facility];  Service: Endoscopy;  Laterality: N/A;  11:15 - moved to 2/1 @ 3:00   ESOPHAGOGASTRODUODENOSCOPY (EGD) WITH PROPOFOL N/A  05/04/2020   Castaneda: normal esophagus, 4cm hh with four cameron ulcers, multiple gastric polyps-fundic gland polyps, erythematous mucosa in pylorus, gastric antral mucosa and oxyntic mucosa with mild chronic gastritis and reactive changes, neg h pylori, normal examined duodenum   KNEE SURGERY     both   Patient Active Problem List   Diagnosis Date Noted   Intractable pain 03/04/2023   Closed compression fracture of L2 lumbar vertebra, initial encounter (HCC) 03/04/2023   Controlled substance agreement signed 08/15/2022   History of lumbar fusion 12/02/2021   Insomnia 05/14/2021   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   Colonic ulcer 08/13/2020   Obesity (BMI 30-39.9) 03/31/2019   Chronic use of benzodiazepine for therapeutic purpose 01/26/2018   Hiatal hernia 10/29/2017   Hyperlipidemia associated with type 2 diabetes mellitus (HCC) 12/31/2015   Coronary artery disease due to lipid rich plaque 12/31/2015   Left knee pain 10/24/2015   Macular degeneration, 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   Essential hypertension 02/20/2015   Chronic low back pain with sciatica 02/20/2015   Gastroesophageal reflux disease without esophagitis 11/20/2014   Anxiety, generalized 11/20/2014   Hypothyroidism 12/27/2013   DM (diabetes mellitus) (HCC) 12/27/2013   Precordial pain 01/15/2013   REFERRING PROVIDER: Floreen Comber, NP   REFERRING DIAG: Wedge compression fracture of second lumbar vertebra, subsequent encounter for fracture with routine healing   Rationale for Evaluation and Treatment: Rehabilitation  THERAPY  DIAG:  Other low back pain  Muscle weakness (generalized)  Muscle spasm of back  ONSET DATE: 03/03/23  SUBJECTIVE:                                                                                                                                                                                           SUBJECTIVE STATEMENT: Patient reports  bending over to feed the cats and had a sharp  pain in her back.   PERTINENT HISTORY:  Hypertension, diabetes, anxiety, history of a lumbar fusion, depression, and fibromyalgia  PAIN:  Are you having pain? Yes: NPRS scale: 5/10 Pain location: left low back  Pain description: constant sharp, burning knife Aggravating factors: bending, any lifting, driving, and stairs Relieving factors: medication and not moving  PRECAUTIONS: Fall  RED FLAGS: None   WEIGHT BEARING RESTRICTIONS: No  FALLS:  Has patient fallen in last 6 months? Yes. Number of falls 1  LIVING ENVIRONMENT: Lives with: lives alone Lives in: House/apartment Stairs: Yes: Internal: 22 steps; on right going up and External: 3 steps; on right going up Has following equipment at home: None  OCCUPATION: retired  PLOF: Independent  PATIENT GOALS: reduced pain, and be able to do her yard work  NEXT MD VISIT: none scheduled  OBJECTIVE:  Note: Objective measures were completed at Evaluation unless otherwise noted.  PATIENT SURVEYS:  FOTO 36.20  SCREENING FOR RED FLAGS: Bowel or bladder incontinence: No Spinal tumors: No Cauda equina syndrome: No Compression fracture: No Abdominal aneurysm: No  COGNITION: Overall cognitive status: Within functional limits for tasks assessed     SENSATION: Patient reports no numbness or tingling.   POSTURE: rounded shoulders, forward head, decreased lumbar lordosis, increased thoracic kyphosis, posterior pelvic tilt, and flexed trunk   PALPATION: TTP: right QL, and left lumbar paraspinals (reproduced familiar pain)  LUMBAR ROM:   AROM eval  Flexion 8; limited by familiar pain  Extension 6; limited by familiar pain  Right lateral flexion   Left lateral flexion   Right rotation 75% limited   Left rotation 90% limited; familiar pain   (Blank rows = not tested)  LOWER EXTREMITY ROM: WFL for activities assessed  LOWER EXTREMITY MMT:    MMT Right eval Left eval  Hip flexion 3+/5 3/5; limited by pain  Hip  extension    Hip abduction    Hip adduction    Hip internal rotation    Hip external rotation    Knee flexion 4/5 3+/5  Knee extension 4-/5 3/5; limited by pain  Ankle dorsiflexion 3+/5 3+/5  Ankle plantarflexion    Ankle inversion    Ankle eversion     (  Blank rows = not tested)  GAIT: Assistive device utilized: None Level of assistance: SBA Comments: flexed trunk with decreased gait speed and stride length   TODAY'S TREATMENT:                                                                                                                              DATE:                                     07/29/23 EXERCISE LOG  Exercise Repetitions and Resistance Comments  Nustep  L3 x 15 minutes     Blank cell = exercise not performed today  Manual Therapy Soft Tissue Mobilization:  ,   Joint Mobilizations:  ,     Modalities: no redness or adverse reaction to today's modalities US/ estim combo x 10 mins 1.5 w/cm2 to RT side LB paras in LT side lying Date:  Unattended Estim: Lumbar, IFC @ 80-150 Hz w/ 40% scan, 15 mins, Pain and Tone  PATIENT EDUCATION:  Education details: POC, prognosis, healing, and goals for therapy Person educated: Patient Education method: Explanation Education comprehension: verbalized understanding  HOME EXERCISE PROGRAM:   ASSESSMENT:  CLINICAL IMPRESSION: Pt arrived today doing fair with LBP, but had increased pain after bending down to put cat  food bowl  down. Rx focused on therex as well as pain relief with [place] combo and IFC to LB paras. STW was attempted, but Pt very tender to palpation.Pt felt better after session. We also discussed getting a raised food bowl if possible to decrease pain triggers.        OBJECTIVE IMPAIRMENTS: Abnormal gait, decreased activity tolerance, decreased balance, decreased mobility, difficulty walking, decreased ROM, decreased strength, hypomobility, impaired tone, postural dysfunction, and pain.   ACTIVITY LIMITATIONS:  carrying, lifting, bending, standing, squatting, sleeping, stairs, transfers, bed mobility, reach over head, and locomotion level  PARTICIPATION LIMITATIONS: meal prep, cleaning, laundry, driving, shopping, community activity, and yard work  PERSONAL FACTORS: Age, Past/current experiences, Time since onset of injury/illness/exacerbation, and 3+ comorbidities: Hypertension, diabetes, anxiety, history of a lumbar fusion, depression, and fibromyalgia  are also affecting patient's functional outcome.   REHAB POTENTIAL: Good  CLINICAL DECISION MAKING: Evolving/moderate complexity  EVALUATION COMPLEXITY: Moderate   GOALS: Goals reviewed with patient? Yes  SHORT TERM GOALS: Target date: 08/10/23  Patient will be independent with her initial HEP.  Baseline: Goal status: INITIAL  2.  Patient will be able to complete her daily activities without her familiar pain exceeding 7/10. Baseline:  Goal status: INITIAL  3.  Patient will improve her lumbar flexion to at least 15 degrees for improved function picking up items from the floor.  Baseline:  Goal status: INITIAL  4.  Patient will be able to demonstrate at least 4-/5 muscular strength throughout her left lower extremity for improved function with her daily activities.  Baseline:  Goal status: INITIAL  LONG TERM GOALS: Target date: 08/31/23  Patient will be independent with her advanced HEP.  Baseline:  Goal status: INITIAL  2.  Patient will be able to complete her daily activities without her familiar pain exceeding 5/10. Baseline:  Goal status: INITIAL  3.  Patient will be able to pick up at least 5 pounds from the floor for improved function picking up her groceries.  Baseline:  Goal status: INITIAL  4.  Patient will be able to navigate at least 3 steps with a reciprocal pattern for improved household independence.  Baseline:  Goal status: INITIAL  PLAN:  PT FREQUENCY: 2x/week  PT DURATION: 6 weeks  PLANNED  INTERVENTIONS: 97164- PT Re-evaluation, 97110-Therapeutic exercises, 97530- Therapeutic activity, 97112- Neuromuscular re-education, 97535- Self Care, 88416- Manual therapy, [id number]- Gait training, 97014- Electrical stimulation (unattended), [phone]- Ultrasound, Patient/Family education, Balance training, Stair training, Spinal mobilization, Cryotherapy, and Moist heat.  PLAN FOR NEXT SESSION: Nustep, lumbar isometric, manual therapy, and modalities as needed   Antoino Westhoff,CHRIS, PTA 07/29/2023, 11:28 AM

## 2023-08-03 ENCOUNTER — Encounter: Payer: Self-pay | Admitting: Physical Therapy

## 2023-08-03 ENCOUNTER — Ambulatory Visit: Payer: 59 | Attending: Student | Admitting: Physical Therapy

## 2023-08-03 DIAGNOSIS — M5459 Other low back pain: Secondary | ICD-10-CM | POA: Diagnosis not present

## 2023-08-03 DIAGNOSIS — M62830 Muscle spasm of back: Secondary | ICD-10-CM | POA: Diagnosis not present

## 2023-08-03 DIAGNOSIS — M6281 Muscle weakness (generalized): Secondary | ICD-10-CM | POA: Insufficient documentation

## 2023-08-03 NOTE — Therapy (Signed)
OUTPATIENT PHYSICAL THERAPY THORACOLUMBAR TREATMENT   Patient Name: Cynthia Mays MRN: [id number] DOB:[birth year], 82 y.o., female Today's Date: 08/03/2023  END OF SESSION:  PT End of Session - 08/03/23 1022     Visit Number 4    Number of Visits 12    Date for PT Re-Evaluation 10/02/23    PT Start Time 1012    PT Stop Time 1110    PT Time Calculation (min) 58 min    Activity Tolerance Patient tolerated treatment well    Behavior During Therapy [facility] for tasks assessed/performed              Past Medical History:  Diagnosis Date   Complication of anesthesia    Depression    Diabetes mellitus    Fibromyalgia    GERD (gastroesophageal reflux disease)    Hiatal hernia    Hyperlipidemia    Hypertension    Hypothyroidism    Hypothyroidism 07/23/2016   PONV (postoperative nausea and vomiting)    Past Surgical History:  Procedure Laterality Date   APPENDECTOMY  2002   arthroscopy  right 2002, left 2007   bilateral knees   BACK SURGERY  10/2007, 09/2008   x2   BIOPSY  10/02/2016   Procedure: BIOPSY;  Surgeon: Malissa Hippo, MD;  [facility];  Service: Endoscopy;;  gastric   BIOPSY  05/04/2020   Procedure: BIOPSY;  Surgeon: Dolores Frame, MD;  [facility];  Service: Gastroenterology;;   CARPAL TUNNEL RELEASE  1986   bilateral   CESAREAN SECTION  1969, 1967   x2   COLONOSCOPY WITH PROPOFOL N/A 05/04/2020   Castaneda:The examined portion of the ileum was normal(focally active, nonspecific ileitis, neg for features of chronicity or granulomas)- A single (solitary) ulcer in the cecum.(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 tortuous colon, non bleeding internal hemorrhoids   ESOPHAGOGASTRODUODENOSCOPY N/A 10/02/2016   Procedure: ESOPHAGOGASTRODUODENOSCOPY (EGD);  Surgeon: Malissa Hippo, MD;  [facility];   Service: Endoscopy;  Laterality: N/A;  11:15 - moved to 2/1 @ 3:00   ESOPHAGOGASTRODUODENOSCOPY (EGD) WITH PROPOFOL N/A 05/04/2020   Castaneda: normal esophagus, 4cm hh with four cameron ulcers, multiple gastric polyps-fundic gland polyps, erythematous mucosa in pylorus, gastric antral mucosa and oxyntic mucosa with mild chronic gastritis and reactive changes, neg h pylori, normal examined duodenum   KNEE SURGERY     both   Patient Active Problem List   Diagnosis Date Noted   Intractable pain 03/04/2023   Closed compression fracture of L2 lumbar vertebra, initial encounter (HCC) 03/04/2023   Controlled substance agreement signed 08/15/2022   History of lumbar fusion 12/02/2021   Insomnia 05/14/2021   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   Colonic ulcer 08/13/2020   Obesity (BMI 30-39.9) 03/31/2019   Chronic use of benzodiazepine for therapeutic purpose 01/26/2018   Hiatal hernia 10/29/2017   Hyperlipidemia associated with type 2 diabetes mellitus (HCC) 12/31/2015   Coronary artery disease due to lipid rich plaque 12/31/2015   Left knee pain 10/24/2015   Macular degeneration, 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   Essential hypertension 02/20/2015   Chronic low back pain with sciatica 02/20/2015   Gastroesophageal reflux disease without esophagitis 11/20/2014   Anxiety, generalized 11/20/2014   Hypothyroidism 12/27/2013   DM (diabetes mellitus) (HCC) 12/27/2013   Precordial pain 01/15/2013   REFERRING PROVIDER: Floreen Comber, NP   REFERRING DIAG: Wedge compression fracture of second  lumbar vertebra, subsequent encounter for fracture with routine healing   Rationale for Evaluation and Treatment: Rehabilitation  THERAPY DIAG:  Other low back pain  Muscle weakness (generalized)  Muscle spasm of back  ONSET DATE: 03/03/23  SUBJECTIVE:                                                                                                                                                                                            SUBJECTIVE STATEMENT: CC is left low back pain at about a 3-4/10 today.  Last Thursday I was trying to get a bird out of my screened in porch and turned quickly and fell.  I have a bruise on my left hip. PERTINENT HISTORY:  Hypertension, diabetes, anxiety, history of a lumbar fusion, depression, and fibromyalgia  PAIN:  Are you having pain? Yes: NPRS scale: 3-4/10 Pain location: left low back  Pain description: constant sharp, burning knife Aggravating factors: bending, any lifting, driving, and stairs Relieving factors: medication and not moving  PRECAUTIONS: Fall  RED FLAGS: None   WEIGHT BEARING RESTRICTIONS: No  FALLS:  Has patient fallen in last 6 months? Yes. Number of falls 1  LIVING ENVIRONMENT: Lives with: lives alone Lives in: House/apartment Stairs: Yes: Internal: 22 steps; on right going up and External: 3 steps; on right going up Has following equipment at home: None  OCCUPATION: retired  PLOF: Independent  PATIENT GOALS: reduced pain, and be able to do her yard work  NEXT MD VISIT: none scheduled  OBJECTIVE:  Note: Objective measures were completed at Evaluation unless otherwise noted.  PATIENT SURVEYS:  FOTO 36.20  SCREENING FOR RED FLAGS: Bowel or bladder incontinence: No Spinal tumors: No Cauda equina syndrome: No Compression fracture: No Abdominal aneurysm: No  COGNITION: Overall cognitive status: Within functional limits for tasks assessed     SENSATION: Patient reports no numbness or tingling.   POSTURE: rounded shoulders, forward head, decreased lumbar lordosis, increased thoracic kyphosis, posterior pelvic tilt, and flexed trunk   PALPATION: TTP: right QL, and left lumbar paraspinals (reproduced familiar pain)  LUMBAR ROM:   AROM eval  Flexion 8; limited by familiar pain  Extension 6; limited by familiar pain  Right lateral flexion   Left lateral flexion   Right rotation 75% limited   Left  rotation 90% limited; familiar pain   (Blank rows = not tested)  LOWER EXTREMITY ROM: WFL for activities assessed  LOWER EXTREMITY MMT:    MMT Right eval Left eval  Hip flexion 3+/5 3/5; limited by pain  Hip extension    Hip abduction    Hip adduction    Hip  internal rotation    Hip external rotation    Knee flexion 4/5 3+/5  Knee extension 4-/5 3/5; limited by pain  Ankle dorsiflexion 3+/5 3+/5  Ankle plantarflexion    Ankle inversion    Ankle eversion     (Blank rows = not tested)  GAIT: Assistive device utilized: None Level of assistance: SBA Comments: flexed trunk with decreased gait speed and stride length   TODAY'S TREATMENT:                                                                                                                              DATE:     08/03/23:                                     EXERCISE LOG  Exercise Repetitions and Resistance Comments  Nustep  Level 4 x 15 minutes                    Patient in right SDLY position with folded pillow between knees for comfort:  STW/M x 8 minutes to patient's left LB/SIJ region f/b HMP and IFC at 80-150 Hz on 40% scan x 20 minutes in seated position.  Normal modality response following removal of modality.                                      07/29/23 EXERCISE LOG  Exercise Repetitions and Resistance Comments  Nustep  L3 x 15 minutes     Blank cell = exercise not performed today  Manual Therapy Soft Tissue Mobilization:  ,   Joint Mobilizations:  ,     Modalities: no redness or adverse reaction to today's modalities US/ estim combo x 10 mins 1.5 w/cm2 to RT side LB paras in LT side lying Date:  Unattended Estim: Lumbar, IFC @ 80-150 Hz w/ 40% scan, 15 mins, Pain and Tone  PATIENT EDUCATION:  Education details: POC, prognosis, healing, and goals for therapy Person educated: Patient Education method: Explanation Education comprehension: verbalized understanding  HOME EXERCISE  PROGRAM:   ASSESSMENT:  CLINICAL IMPRESSION: Patient very tender over her left SIJ region today.  She did very well with treatment today and felt "good" afterwards.       OBJECTIVE IMPAIRMENTS: Abnormal gait, decreased activity tolerance, decreased balance, decreased mobility, difficulty walking, decreased ROM, decreased strength, hypomobility, impaired tone, postural dysfunction, and pain.   ACTIVITY LIMITATIONS: carrying, lifting, bending, standing, squatting, sleeping, stairs, transfers, bed mobility, reach over head, and locomotion level  PARTICIPATION LIMITATIONS: meal prep, cleaning, laundry, driving, shopping, community activity, and yard work  PERSONAL FACTORS: Age, Past/current experiences, Time since onset of injury/illness/exacerbation, and 3+ comorbidities: Hypertension, diabetes, anxiety, history of a lumbar fusion, depression, and fibromyalgia  are also affecting patient's functional outcome.  REHAB POTENTIAL: Good  CLINICAL DECISION MAKING: Evolving/moderate complexity  EVALUATION COMPLEXITY: Moderate   GOALS: Goals reviewed with patient? Yes  SHORT TERM GOALS: Target date: 08/10/23  Patient will be independent with her initial HEP.  Baseline: Goal status: INITIAL  2.  Patient will be able to complete her daily activities without her familiar pain exceeding 7/10. Baseline:  Goal status: INITIAL  3.  Patient will improve her lumbar flexion to at least 15 degrees for improved function picking up items from the floor.  Baseline:  Goal status: INITIAL  4.  Patient will be able to demonstrate at least 4-/5 muscular strength throughout her left lower extremity for improved function with her daily activities.  Baseline:  Goal status: INITIAL  LONG TERM GOALS: Target date: 08/31/23  Patient will be independent with her advanced HEP.  Baseline:  Goal status: INITIAL  2.  Patient will be able to complete her daily activities without her familiar pain  exceeding 5/10. Baseline:  Goal status: INITIAL  3.  Patient will be able to pick up at least 5 pounds from the floor for improved function picking up her groceries.  Baseline:  Goal status: INITIAL  4.  Patient will be able to navigate at least 3 steps with a reciprocal pattern for improved household independence.  Baseline:  Goal status: INITIAL  PLAN:  PT FREQUENCY: 2x/week  PT DURATION: 6 weeks  PLANNED INTERVENTIONS: 97164- PT Re-evaluation, 97110-Therapeutic exercises, 97530- Therapeutic activity, 97112- Neuromuscular re-education, 97535- Self Care, 40981- Manual therapy, [id number]- Gait training, 97014- Electrical stimulation (unattended), [phone]- Ultrasound, Patient/Family education, Balance training, Stair training, Spinal mobilization, Cryotherapy, and Moist heat.  PLAN FOR NEXT SESSION: Nustep, lumbar isometric, manual therapy, and modalities as needed   Kouper Spinella, [place], PT 08/03/2023, 11:32 AM

## 2023-08-04 ENCOUNTER — Ambulatory Visit: Payer: 59 | Admitting: Physical Therapy

## 2023-08-05 ENCOUNTER — Other Ambulatory Visit: Payer: Self-pay | Admitting: Family

## 2023-08-05 DIAGNOSIS — G4700 Insomnia, unspecified: Secondary | ICD-10-CM

## 2023-08-05 DIAGNOSIS — K633 Ulcer of intestine: Secondary | ICD-10-CM

## 2023-08-05 DIAGNOSIS — F411 Generalized anxiety disorder: Secondary | ICD-10-CM

## 2023-08-05 DIAGNOSIS — K529 Noninfective gastroenteritis and colitis, unspecified: Secondary | ICD-10-CM

## 2023-08-07 ENCOUNTER — Ambulatory Visit: Payer: 59 | Admitting: *Deleted

## 2023-08-07 ENCOUNTER — Encounter: Payer: Self-pay | Admitting: *Deleted

## 2023-08-07 DIAGNOSIS — M62830 Muscle spasm of back: Secondary | ICD-10-CM | POA: Diagnosis not present

## 2023-08-07 DIAGNOSIS — M6281 Muscle weakness (generalized): Secondary | ICD-10-CM | POA: Diagnosis not present

## 2023-08-07 DIAGNOSIS — M5459 Other low back pain: Secondary | ICD-10-CM | POA: Diagnosis not present

## 2023-08-07 NOTE — Therapy (Signed)
OUTPATIENT PHYSICAL THERAPY THORACOLUMBAR TREATMENT   Patient Name: Cynthia Mays MRN: [id number] DOB:06/10/1941, 82 y.o., female Today's Date: 08/07/2023  END OF SESSION:  PT End of Session - 08/07/23 1007     Visit Number 5    Number of Visits 12    Date for PT Re-Evaluation 10/02/23    PT Start Time 1015    PT Stop Time 1106    PT Time Calculation (min) 51 min              Past Medical History:  Diagnosis Date   Complication of anesthesia    Depression    Diabetes mellitus    Fibromyalgia    GERD (gastroesophageal reflux disease)    Hiatal hernia    Hyperlipidemia    Hypertension    Hypothyroidism    Hypothyroidism 07/23/2016   PONV (postoperative nausea and vomiting)    Past Surgical History:  Procedure Laterality Date   APPENDECTOMY  2002   arthroscopy  right 2002, left 2007   bilateral knees   BACK SURGERY  10/2007, 09/2008   x2   BIOPSY  10/02/2016   Procedure: BIOPSY;  Surgeon: Malissa Hippo, MD;  [facility];  Service: Endoscopy;;  gastric   BIOPSY  05/04/2020   Procedure: BIOPSY;  Surgeon: Dolores Frame, MD;  [facility];  Service: Gastroenterology;;   CARPAL TUNNEL RELEASE  1986   bilateral   CESAREAN SECTION  1969, 1967   x2   COLONOSCOPY WITH PROPOFOL N/A 05/04/2020   Castaneda:The examined portion of the ileum was normal(focally active, nonspecific ileitis, neg for features of chronicity or granulomas)- A single (solitary) ulcer in the cecum.(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 tortuous colon, non bleeding internal hemorrhoids   ESOPHAGOGASTRODUODENOSCOPY N/A 10/02/2016   Procedure: ESOPHAGOGASTRODUODENOSCOPY (EGD);  Surgeon: Malissa Hippo, MD;  [facility];  Service: Endoscopy;  Laterality: N/A;  11:15 - moved to 2/1 @ 3:00   ESOPHAGOGASTRODUODENOSCOPY (EGD) WITH PROPOFOL N/A  05/04/2020   Castaneda: normal esophagus, 4cm hh with four cameron ulcers, multiple gastric polyps-fundic gland polyps, erythematous mucosa in pylorus, gastric antral mucosa and oxyntic mucosa with mild chronic gastritis and reactive changes, neg h pylori, normal examined duodenum   KNEE SURGERY     both   Patient Active Problem List   Diagnosis Date Noted   Intractable pain 03/04/2023   Closed compression fracture of L2 lumbar vertebra, initial encounter (HCC) 03/04/2023   Controlled substance agreement signed 08/15/2022   History of lumbar fusion 12/02/2021   Insomnia 05/14/2021   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   Colonic ulcer 08/13/2020   Obesity (BMI 30-39.9) 03/31/2019   Chronic use of benzodiazepine for therapeutic purpose 01/26/2018   Hiatal hernia 10/29/2017   Hyperlipidemia associated with type 2 diabetes mellitus (HCC) 12/31/2015   Coronary artery disease due to lipid rich plaque 12/31/2015   Left knee pain 10/24/2015   Macular degeneration, 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   Essential hypertension 02/20/2015   Chronic low back pain with sciatica 02/20/2015   Gastroesophageal reflux disease without esophagitis 11/20/2014   Anxiety, generalized 11/20/2014   Hypothyroidism 12/27/2013   DM (diabetes mellitus) (HCC) 12/27/2013   Precordial pain 01/15/2013   REFERRING PROVIDER: Floreen Comber, NP   REFERRING DIAG: Wedge compression fracture of second lumbar vertebra, subsequent encounter for fracture with routine healing   Rationale for Evaluation and Treatment: Rehabilitation  THERAPY  DIAG:  Other low back pain  Muscle weakness (generalized)  Muscle spasm of back  ONSET DATE: 03/03/23  SUBJECTIVE:                                                                                                                                                                                           SUBJECTIVE STATEMENT:   Doing better with decreased pain today. 6/10 LT side LBP  yesterday after going up/down steps   PERTINENT HISTORY:  Hypertension, diabetes, anxiety, history of a lumbar fusion, depression, and fibromyalgia  PAIN:  Are you having pain? Yes: NPRS scale: 3/10 Pain location: left low back  Pain description: constant sharp, burning knife Aggravating factors: bending, any lifting, driving, and stairs Relieving factors: medication and not moving  PRECAUTIONS: Fall  RED FLAGS: None   WEIGHT BEARING RESTRICTIONS: No  FALLS:  Has patient fallen in last 6 months? Yes. Number of falls 1  LIVING ENVIRONMENT: Lives with: lives alone Lives in: House/apartment Stairs: Yes: Internal: 22 steps; on right going up and External: 3 steps; on right going up Has following equipment at home: None  OCCUPATION: retired  PLOF: Independent  PATIENT GOALS: reduced pain, and be able to do her yard work  NEXT MD VISIT: none scheduled  OBJECTIVE:  Note: Objective measures were completed at Evaluation unless otherwise noted.  PATIENT SURVEYS:  FOTO 36.20  SCREENING FOR RED FLAGS: Bowel or bladder incontinence: No Spinal tumors: No Cauda equina syndrome: No Compression fracture: No Abdominal aneurysm: No  COGNITION: Overall cognitive status: Within functional limits for tasks assessed     SENSATION: Patient reports no numbness or tingling.   POSTURE: rounded shoulders, forward head, decreased lumbar lordosis, increased thoracic kyphosis, posterior pelvic tilt, and flexed trunk   PALPATION: TTP: right QL, and left lumbar paraspinals (reproduced familiar pain)  LUMBAR ROM:   AROM eval  Flexion 8; limited by familiar pain  Extension 6; limited by familiar pain  Right lateral flexion   Left lateral flexion   Right rotation 75% limited   Left rotation 90% limited; familiar pain   (Blank rows = not tested)  LOWER EXTREMITY ROM: WFL for activities assessed  LOWER EXTREMITY MMT:    MMT Right eval Left eval  Hip flexion 3+/5 3/5; limited  by pain  Hip extension    Hip abduction    Hip adduction    Hip internal rotation    Hip external rotation    Knee flexion 4/5 3+/5  Knee extension 4-/5 3/5; limited by pain  Ankle dorsiflexion 3+/5 3+/5  Ankle plantarflexion    Ankle inversion    Ankle eversion     (  Blank rows = not tested)  GAIT: Assistive device utilized: None Level of assistance: SBA Comments: flexed trunk with decreased gait speed and stride length   TODAY'S TREATMENT:                                                                                                                              DATE:     08/07/23:                                     EXERCISE LOG  Exercise Repetitions and Resistance Comments  Nustep  Level 4 x 15 minutes                    Patient in right SDLY position with folded pillow between knees for comfort:   US/ estim combo x 10 mins 1.5 w/cm2 to LT side LB paras in RT side lying  HMP and IFC at 80-150 Hz on 40% scan x 20 minutes in seated position.  Normal modality response following removal of modality.                                      07/29/23 EXERCISE LOG  Exercise Repetitions and Resistance Comments  Nustep  L3 x 15 minutes     Blank cell = exercise not performed today  Manual Therapy Soft Tissue Mobilization:  ,   Joint Mobilizations:  ,     Modalities: no redness or adverse reaction to today's modalities US/ estim combo x 10 mins 1.5 w/cm2 to LT side LB paras in RT side lying Date:  Unattended Estim: Lumbar, IFC @ 80-150 Hz w/ 40% scan, 15 mins, Pain and Tone  PATIENT EDUCATION:  Education details: POC, prognosis, healing, and goals for therapy Person educated: Patient Education method: Explanation Education comprehension: verbalized understanding  HOME EXERCISE PROGRAM:   ASSESSMENT:  CLINICAL IMPRESSION:FOTO Patient arrived today doing better. Pt able to perform therex f/b [place] combo and IFC  for pain control.She was still very sore LT side SIJ /LB  paras.She was able to meet STG's 1 and 2 today, but others are on going due to pain.        OBJECTIVE IMPAIRMENTS: Abnormal gait, decreased activity tolerance, decreased balance, decreased mobility, difficulty walking, decreased ROM, decreased strength, hypomobility, impaired tone, postural dysfunction, and pain.   ACTIVITY LIMITATIONS: carrying, lifting, bending, standing, squatting, sleeping, stairs, transfers, bed mobility, reach over head, and locomotion level  PARTICIPATION LIMITATIONS: meal prep, cleaning, laundry, driving, shopping, community activity, and yard work  PERSONAL FACTORS: Age, Past/current experiences, Time since onset of injury/illness/exacerbation, and 3+ comorbidities: Hypertension, diabetes, anxiety, history of a lumbar fusion, depression, and fibromyalgia  are also affecting patient's functional outcome.   REHAB POTENTIAL: Good  CLINICAL DECISION MAKING: Evolving/moderate complexity  EVALUATION  COMPLEXITY: Moderate   GOALS: Goals reviewed with patient? Yes  SHORT TERM GOALS: Target date: 08/10/23  Patient will be independent with her initial HEP.  Baseline: Goal status: MET  2.  Patient will be able to complete her daily activities without her familiar pain exceeding 7/10. Baseline:  Goal status: MET  3.  Patient will improve her lumbar flexion to at least 15 degrees for improved function picking up items from the floor.  Baseline:  Goal status: On going  4.  Patient will be able to demonstrate at least 4-/5 muscular strength throughout her left lower extremity for improved function with her daily activities.  Baseline:  Goal status: On going  LONG TERM GOALS: Target date: 08/31/23  Patient will be independent with her advanced HEP.  Baseline:  Goal status: INITIAL  2.  Patient will be able to complete her daily activities without her familiar pain exceeding 5/10. Baseline:  Goal status: INITIAL  3.  Patient will be able to pick up at least  5 pounds from the floor for improved function picking up her groceries.  Baseline:  Goal status: INITIAL  4.  Patient will be able to navigate at least 3 steps with a reciprocal pattern for improved household independence.  Baseline:  Goal status: INITIAL  PLAN:  PT FREQUENCY: 2x/week  PT DURATION: 6 weeks  PLANNED INTERVENTIONS: 97164- PT Re-evaluation, 97110-Therapeutic exercises, 97530- Therapeutic activity, 97112- Neuromuscular re-education, 97535- Self Care, 16109- Manual therapy, [id number]- Gait training, 97014- Electrical stimulation (unattended), [phone]- Ultrasound, Patient/Family education, Balance training, Stair training, Spinal mobilization, Cryotherapy, and Moist heat.  PLAN FOR NEXT SESSION: Nustep, lumbar isometric, manual therapy, and modalities as needed   Khyan Oats,CHRIS, PTA 08/07/2023, 11:23 AM

## 2023-08-10 ENCOUNTER — Ambulatory Visit: Payer: 59

## 2023-08-10 DIAGNOSIS — M6281 Muscle weakness (generalized): Secondary | ICD-10-CM | POA: Diagnosis not present

## 2023-08-10 DIAGNOSIS — M5459 Other low back pain: Secondary | ICD-10-CM

## 2023-08-10 DIAGNOSIS — M62830 Muscle spasm of back: Secondary | ICD-10-CM | POA: Diagnosis not present

## 2023-08-10 NOTE — Therapy (Signed)
OUTPATIENT PHYSICAL THERAPY THORACOLUMBAR TREATMENT   Patient Name: Cynthia Mays MRN: [id number] DOB:Mar 21, 1941, 82 y.o., female Today's Date: 08/10/2023  END OF SESSION:  PT End of Session - 08/10/23 1119     Visit Number 6    Number of Visits 12    Date for PT Re-Evaluation 10/02/23    PT Start Time 1117   Patient arrived late to her appointment.   PT Stop Time 1153    PT Time Calculation (min) 36 min    Activity Tolerance Patient tolerated treatment well    Behavior During Therapy WFL for tasks assessed/performed               Past Medical History:  Diagnosis Date   Complication of anesthesia    Depression    Diabetes mellitus    Fibromyalgia    GERD (gastroesophageal reflux disease)    Hiatal hernia    Hyperlipidemia    Hypertension    Hypothyroidism    Hypothyroidism 07/23/2016   PONV (postoperative nausea and vomiting)    Past Surgical History:  Procedure Laterality Date   APPENDECTOMY  2002   arthroscopy  right 2002, left 2007   bilateral knees   BACK SURGERY  10/2007, 09/2008   x2   BIOPSY  10/02/2016   Procedure: BIOPSY;  Surgeon: Malissa Hippo, MD;  [facility];  Service: Endoscopy;;  gastric   BIOPSY  05/04/2020   Procedure: BIOPSY;  Surgeon: Dolores Frame, MD;  Location: [facility];  Service: Gastroenterology;;   CARPAL TUNNEL RELEASE  1986   bilateral   CESAREAN SECTION  1969, 1967   x2   COLONOSCOPY WITH PROPOFOL N/A 05/04/2020   Castaneda:The examined portion of the ileum was normal(focally active, nonspecific ileitis, neg for features of chronicity or granulomas)- A single (solitary) ulcer in the cecum.(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 tortuous colon, non bleeding internal hemorrhoids   ESOPHAGOGASTRODUODENOSCOPY N/A 10/02/2016   Procedure: ESOPHAGOGASTRODUODENOSCOPY (EGD);  Surgeon: Malissa Hippo, MD;  Location: [facility];  Service: Endoscopy;  Laterality: N/A;  11:15 - moved to 2/1 @ 3:00   ESOPHAGOGASTRODUODENOSCOPY (EGD) WITH PROPOFOL N/A 05/04/2020   Castaneda: normal esophagus, 4cm hh with four cameron ulcers, multiple gastric polyps-fundic gland polyps, erythematous mucosa in pylorus, gastric antral mucosa and oxyntic mucosa with mild chronic gastritis and reactive changes, neg h pylori, normal examined duodenum   KNEE SURGERY     both   Patient Active Problem List   Diagnosis Date Noted   Intractable pain 03/04/2023   Closed compression fracture of L2 lumbar vertebra, initial encounter (HCC) 03/04/2023   Controlled substance agreement signed 08/15/2022   History of lumbar fusion 12/02/2021   Insomnia 05/14/2021   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   Colonic ulcer 08/13/2020   Obesity (BMI 30-39.9) 03/31/2019   Chronic use of benzodiazepine for therapeutic purpose 01/26/2018   Hiatal hernia 10/29/2017   Hyperlipidemia associated with type 2 diabetes mellitus (HCC) 12/31/2015   Coronary artery disease due to lipid rich plaque 12/31/2015   Left knee pain 10/24/2015   Macular degeneration, 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   Essential hypertension 02/20/2015   Chronic low back pain with sciatica 02/20/2015   Gastroesophageal reflux disease without esophagitis 11/20/2014   Anxiety, generalized 11/20/2014   Hypothyroidism 12/27/2013   DM (diabetes mellitus) (HCC) 12/27/2013   Precordial pain 01/15/2013   REFERRING PROVIDER: Floreen Comber, NP  REFERRING DIAG: Wedge compression fracture of second lumbar vertebra, subsequent encounter for fracture with routine healing   Rationale for Evaluation and Treatment: Rehabilitation  THERAPY DIAG:  Other low back pain  Muscle weakness (generalized)  ONSET DATE: 03/03/23  SUBJECTIVE:                                                                                                                                                                                            SUBJECTIVE STATEMENT: Patient reports that her back is hurting all the way across today as she had to mop her house yesterday.    PERTINENT HISTORY:  Hypertension, diabetes, anxiety, history of a lumbar fusion, depression, and fibromyalgia  PAIN:  Are you having pain? Yes: NPRS scale: 6/10 Pain location: left low back  Pain description: constant sharp, burning knife Aggravating factors: bending, any lifting, driving, and stairs Relieving factors: medication and not moving  PRECAUTIONS: Fall  RED FLAGS: None   WEIGHT BEARING RESTRICTIONS: No  FALLS:  Has patient fallen in last 6 months? Yes. Number of falls 1  LIVING ENVIRONMENT: Lives with: lives alone Lives in: House/apartment Stairs: Yes: Internal: 22 steps; on right going up and External: 3 steps; on right going up Has following equipment at home: None  OCCUPATION: retired  PLOF: Independent  PATIENT GOALS: reduced pain, and be able to do her yard work  NEXT MD VISIT: none scheduled  OBJECTIVE:  Note: Objective measures were completed at Evaluation unless otherwise noted.  PATIENT SURVEYS:  FOTO 36.20  SCREENING FOR RED FLAGS: Bowel or bladder incontinence: No Spinal tumors: No Cauda equina syndrome: No Compression fracture: No Abdominal aneurysm: No  COGNITION: Overall cognitive status: Within functional limits for tasks assessed     SENSATION: Patient reports no numbness or tingling.   POSTURE: rounded shoulders, forward head, decreased lumbar lordosis, increased thoracic kyphosis, posterior pelvic tilt, and flexed trunk   PALPATION: TTP: right QL, and left lumbar paraspinals (reproduced familiar pain)  LUMBAR ROM:   AROM eval  Flexion 8; limited by familiar pain  Extension 6; limited by familiar pain  Right lateral flexion   Left lateral flexion   Right rotation 75% limited   Left rotation 90% limited; familiar pain   (Blank rows = not  tested)  LOWER EXTREMITY ROM: WFL for activities assessed  LOWER EXTREMITY MMT:    MMT Right eval Left eval  Hip flexion 3+/5 3/5; limited by pain  Hip extension    Hip abduction    Hip adduction    Hip internal rotation    Hip external rotation    Knee flexion 4/5 3+/5  Knee extension 4-/5 3/5; limited by pain  Ankle dorsiflexion 3+/5 3+/5  Ankle plantarflexion    Ankle inversion    Ankle eversion     (Blank rows = not tested)  GAIT: Assistive device utilized: None Level of assistance: SBA Comments: flexed trunk with decreased gait speed and stride length   TODAY'S TREATMENT:                                                                                                                              DATE:                                     08/10/23 EXERCISE LOG  Exercise Repetitions and Resistance Comments  Nustep  L4 x 15 minutes    Blank cell = exercise not performed today  Modalities: no redness or adverse reaction to today's modalities  Date:  Unattended Estim: Lumbar, IFC @ 80-150 Hz w/ 40% scan, 14 mins, Pain and Tone Hot Pack: Lumbar, 14 mins, Pain and Tone  08/07/23: EXERCISE LOG  Exercise Repetitions and Resistance Comments  Nustep  Level 4 x 15 minutes                    Patient in right SDLY position with folded pillow between knees for comfort:   US/ estim combo x 10 mins 1.5 w/cm2 to LT side LB paras in RT side lying  HMP and IFC at 80-150 Hz on 40% scan x 20 minutes in seated position.  Normal modality response following removal of modality.                                      07/29/23 EXERCISE LOG  Exercise Repetitions and Resistance Comments  Nustep  L3 x 15 minutes     Blank cell = exercise not performed today  Manual Therapy Soft Tissue Mobilization:  ,   Joint Mobilizations:  ,     Modalities: no redness or adverse reaction to today's modalities US/ estim combo x 10 mins 1.5 w/cm2 to LT side LB paras in RT side lying Date:   Unattended Estim: Lumbar, IFC @ 80-150 Hz w/ 40% scan, 15 mins, Pain and Tone  PATIENT EDUCATION:  Education details: healing and prognosis  Person educated: Patient Education method: Explanation Education comprehension: verbalized understanding  HOME EXERCISE PROGRAM:   ASSESSMENT:  CLINICAL IMPRESSION: Today's treatment was limited due to increased low back pain and she arrived late to her appointment. Today's treatment focused on familiar interventions for reduced lumbar pain with good effectiveness. She experienced no increase in pain or discomfort with any of today's interventions. She reported feeling better upon the conclusion of treatment. She continues to require skilled physical therapy to address her remaining impairments to maximize her functional mobility.  OBJECTIVE IMPAIRMENTS: Abnormal gait, decreased activity tolerance, decreased balance, decreased mobility, difficulty walking, decreased ROM, decreased strength, hypomobility, impaired tone, postural dysfunction, and pain.   ACTIVITY LIMITATIONS: carrying, lifting, bending, standing, squatting, sleeping, stairs, transfers, bed mobility, reach over head, and locomotion level  PARTICIPATION LIMITATIONS: meal prep, cleaning, laundry, driving, shopping, community activity, and yard work  PERSONAL FACTORS: Age, Past/current experiences, Time since onset of injury/illness/exacerbation, and 3+ comorbidities: Hypertension, diabetes, anxiety, history of a lumbar fusion, depression, and fibromyalgia  are also affecting patient's functional outcome.   REHAB POTENTIAL: Good  CLINICAL DECISION MAKING: Evolving/moderate complexity  EVALUATION COMPLEXITY: Moderate   GOALS: Goals reviewed with patient? Yes  SHORT TERM GOALS: Target date: 08/10/23  Patient will be independent with her initial HEP.  Baseline: Goal status: MET  2.  Patient will be able to complete her daily activities without her familiar pain exceeding  7/10. Baseline:  Goal status: MET  3.  Patient will improve her lumbar flexion to at least 15 degrees for improved function picking up items from the floor.  Baseline:  Goal status: On going  4.  Patient will be able to demonstrate at least 4-/5 muscular strength throughout her left lower extremity for improved function with her daily activities.  Baseline:  Goal status: On going  LONG TERM GOALS: Target date: 08/31/23  Patient will be independent with her advanced HEP.  Baseline:  Goal status: INITIAL  2.  Patient will be able to complete her daily activities without her familiar pain exceeding 5/10. Baseline:  Goal status: INITIAL  3.  Patient will be able to pick up at least 5 pounds from the floor for improved function picking up her groceries.  Baseline:  Goal status: INITIAL  4.  Patient will be able to navigate at least 3 steps with a reciprocal pattern for improved household independence.  Baseline:  Goal status: INITIAL  PLAN:  PT FREQUENCY: 2x/week  PT DURATION: 6 weeks  PLANNED INTERVENTIONS: 97164- PT Re-evaluation, 97110-Therapeutic exercises, 97530- Therapeutic activity, 97112- Neuromuscular re-education, 97535- Self Care, 40102- Manual therapy, [id number]- Gait training, 97014- Electrical stimulation (unattended), [phone]- Ultrasound, Patient/Family education, Balance training, Stair training, Spinal mobilization, Cryotherapy, and Moist heat.  PLAN FOR NEXT SESSION: Nustep, lumbar isometric, manual therapy, and modalities as needed   Granville Lewis, PT 08/10/2023, 12:23 PM

## 2023-08-13 ENCOUNTER — Ambulatory Visit: Payer: 59 | Admitting: Physical Therapy

## 2023-08-13 DIAGNOSIS — M5459 Other low back pain: Secondary | ICD-10-CM

## 2023-08-13 DIAGNOSIS — M62830 Muscle spasm of back: Secondary | ICD-10-CM

## 2023-08-13 DIAGNOSIS — M6281 Muscle weakness (generalized): Secondary | ICD-10-CM

## 2023-08-13 NOTE — Therapy (Signed)
OUTPATIENT PHYSICAL THERAPY THORACOLUMBAR TREATMENT   Patient Name: Cynthia Mays MRN: [id number] DOB:September 18, 1940, 82 y.o., female Today's Date: 08/13/2023  END OF SESSION:  PT End of Session - 08/13/23 1256     Visit Number 7    Number of Visits 12    Date for PT Re-Evaluation 10/02/23    PT Start Time 0100    PT Stop Time 0154    PT Time Calculation (min) 54 min    Activity Tolerance Patient tolerated treatment well    Behavior During Therapy [facility] for tasks assessed/performed               Past Medical History:  Diagnosis Date   Complication of anesthesia    Depression    Diabetes mellitus    Fibromyalgia    GERD (gastroesophageal reflux disease)    Hiatal hernia    Hyperlipidemia    Hypertension    Hypothyroidism    Hypothyroidism 07/23/2016   PONV (postoperative nausea and vomiting)    Past Surgical History:  Procedure Laterality Date   APPENDECTOMY  2002   arthroscopy  right 2002, left 2007   bilateral knees   BACK SURGERY  10/2007, 09/2008   x2   BIOPSY  10/02/2016   Procedure: BIOPSY;  Surgeon: Malissa Hippo, MD;  [facility];  Service: Endoscopy;;  gastric   BIOPSY  05/04/2020   Procedure: BIOPSY;  Surgeon: Dolores Frame, MD;  [facility];  Service: Gastroenterology;;   CARPAL TUNNEL RELEASE  1986   bilateral   CESAREAN SECTION  1969, 1967   x2   COLONOSCOPY WITH PROPOFOL N/A 05/04/2020   Castaneda:The examined portion of the ileum was normal(focally active, nonspecific ileitis, neg for features of chronicity or granulomas)- A single (solitary) ulcer in the cecum.(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 tortuous colon, non bleeding internal hemorrhoids   ESOPHAGOGASTRODUODENOSCOPY N/A 10/02/2016   Procedure: ESOPHAGOGASTRODUODENOSCOPY (EGD);  Surgeon: Malissa Hippo, MD;  [facility];   Service: Endoscopy;  Laterality: N/A;  11:15 - moved to 2/1 @ 3:00   ESOPHAGOGASTRODUODENOSCOPY (EGD) WITH PROPOFOL N/A 05/04/2020   Castaneda: normal esophagus, 4cm hh with four cameron ulcers, multiple gastric polyps-fundic gland polyps, erythematous mucosa in pylorus, gastric antral mucosa and oxyntic mucosa with mild chronic gastritis and reactive changes, neg h pylori, normal examined duodenum   KNEE SURGERY     both   Patient Active Problem List   Diagnosis Date Noted   Intractable pain 03/04/2023   Closed compression fracture of L2 lumbar vertebra, initial encounter (HCC) 03/04/2023   Controlled substance agreement signed 08/15/2022   History of lumbar fusion 12/02/2021   Insomnia 05/14/2021   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   Colonic ulcer 08/13/2020   Obesity (BMI 30-39.9) 03/31/2019   Chronic use of benzodiazepine for therapeutic purpose 01/26/2018   Hiatal hernia 10/29/2017   Hyperlipidemia associated with type 2 diabetes mellitus (HCC) 12/31/2015   Coronary artery disease due to lipid rich plaque 12/31/2015   Left knee pain 10/24/2015   Macular degeneration, 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   Essential hypertension 02/20/2015   Chronic low back pain with sciatica 02/20/2015   Gastroesophageal reflux disease without esophagitis 11/20/2014   Anxiety, generalized 11/20/2014   Hypothyroidism 12/27/2013   DM (diabetes mellitus) (HCC) 12/27/2013   Precordial pain 01/15/2013   REFERRING PROVIDER: Floreen Comber, NP   REFERRING DIAG: Wedge compression fracture of  second lumbar vertebra, subsequent encounter for fracture with routine healing   Rationale for Evaluation and Treatment: Rehabilitation  THERAPY DIAG:  Other low back pain  Muscle weakness (generalized)  Muscle spasm of back  ONSET DATE: 03/03/23  SUBJECTIVE:                                                                                                                                                                                            SUBJECTIVE STATEMENT: Pain is about a 4.  PERTINENT HISTORY:  Hypertension, diabetes, anxiety, history of a lumbar fusion, depression, and fibromyalgia  PAIN:  Are you having pain? Yes: NPRS scale: 6/10 Pain location: left low back  Pain description: constant sharp, burning knife Aggravating factors: bending, any lifting, driving, and stairs Relieving factors: medication and not moving  PRECAUTIONS: Fall  RED FLAGS: None   WEIGHT BEARING RESTRICTIONS: No  FALLS:  Has patient fallen in last 6 months? Yes. Number of falls 1  LIVING ENVIRONMENT: Lives with: lives alone Lives in: House/apartment Stairs: Yes: Internal: 22 steps; on right going up and External: 3 steps; on right going up Has following equipment at home: None  OCCUPATION: retired  PLOF: Independent  PATIENT GOALS: reduced pain, and be able to do her yard work  NEXT MD VISIT: none scheduled  OBJECTIVE:  Note: Objective measures were completed at Evaluation unless otherwise noted.  PATIENT SURVEYS:  FOTO 36.20  SCREENING FOR RED FLAGS: Bowel or bladder incontinence: No Spinal tumors: No Cauda equina syndrome: No Compression fracture: No Abdominal aneurysm: No  COGNITION: Overall cognitive status: Within functional limits for tasks assessed     SENSATION: Patient reports no numbness or tingling.   POSTURE: rounded shoulders, forward head, decreased lumbar lordosis, increased thoracic kyphosis, posterior pelvic tilt, and flexed trunk   PALPATION: TTP: right QL, and left lumbar paraspinals (reproduced familiar pain)  LUMBAR ROM:   AROM eval  Flexion 8; limited by familiar pain  Extension 6; limited by familiar pain  Right lateral flexion   Left lateral flexion   Right rotation 75% limited   Left rotation 90% limited; familiar pain   (Blank rows = not tested)  LOWER EXTREMITY ROM: WFL for activities assessed  LOWER EXTREMITY MMT:    MMT Right eval  Left eval  Hip flexion 3+/5 3/5; limited by pain  Hip extension    Hip abduction    Hip adduction    Hip internal rotation    Hip external rotation    Knee flexion 4/5 3+/5  Knee extension 4-/5 3/5; limited by pain  Ankle dorsiflexion 3+/5 3+/5  Ankle plantarflexion  Ankle inversion    Ankle eversion     (Blank rows = not tested)  GAIT: Assistive device utilized: None Level of assistance: SBA Comments: flexed trunk with decreased gait speed and stride length   TODAY'S TREATMENT:                                                                                                                              DATE:    08/13/23:  EXERCISE LOG  Exercise Repetitions and Resistance Comments  Nustep  Level 4 x 15 minutes                    Patient in right SDLY position with folded pillow between knees for comfort:   US/ estim combo x 10 mins 1.5 w/cm2 to LT side LB paras in RT side lying  HMP and IFC at 80-150 Hz on 40% scan x 20 minutes in seated position.  Normal modality response following removal of modality.                                   08/10/23 EXERCISE LOG  Exercise Repetitions and Resistance Comments  Nustep  L4 x 15 minutes    Blank cell = exercise not performed today  Modalities: no redness or adverse reaction to today's modalities  Date:  Unattended Estim: Lumbar, IFC @ 80-150 Hz w/ 40% scan, 14 mins, Pain and Tone Hot Pack: Lumbar, 14 mins, Pain and Tone   PATIENT EDUCATION:  Education details: healing and prognosis  Person educated: Patient Education method: Explanation Education comprehension: verbalized understanding  HOME EXERCISE PROGRAM:   ASSESSMENT:  CLINICAL IMPRESSION: Patient's pain localized to left gluteal region near her SIJ.  Performed STW/M with ischemic release technique with good response.  OBJECTIVE IMPAIRMENTS: Abnormal gait, decreased activity tolerance, decreased balance, decreased mobility, difficulty walking, decreased ROM,  decreased strength, hypomobility, impaired tone, postural dysfunction, and pain.   ACTIVITY LIMITATIONS: carrying, lifting, bending, standing, squatting, sleeping, stairs, transfers, bed mobility, reach over head, and locomotion level  PARTICIPATION LIMITATIONS: meal prep, cleaning, laundry, driving, shopping, community activity, and yard work  PERSONAL FACTORS: Age, Past/current experiences, Time since onset of injury/illness/exacerbation, and 3+ comorbidities: Hypertension, diabetes, anxiety, history of a lumbar fusion, depression, and fibromyalgia  are also affecting patient's functional outcome.   REHAB POTENTIAL: Good  CLINICAL DECISION MAKING: Evolving/moderate complexity  EVALUATION COMPLEXITY: Moderate   GOALS: Goals reviewed with patient? Yes  SHORT TERM GOALS: Target date: 08/10/23  Patient will be independent with her initial HEP.  Baseline: Goal status: MET  2.  Patient will be able to complete her daily activities without her familiar pain exceeding 7/10. Baseline:  Goal status: MET  3.  Patient will improve her lumbar flexion to at least 15 degrees for improved function picking up items from the floor.  Baseline:  Goal status: On going  4.  Patient will be able to demonstrate at least 4-/5 muscular strength throughout her left lower extremity for improved function with her daily activities.  Baseline:  Goal status: On going  LONG TERM GOALS: Target date: 08/31/23  Patient will be independent with her advanced HEP.  Baseline:  Goal status: INITIAL  2.  Patient will be able to complete her daily activities without her familiar pain exceeding 5/10. Baseline:  Goal status: INITIAL  3.  Patient will be able to pick up at least 5 pounds from the floor for improved function picking up her groceries.  Baseline:  Goal status: INITIAL  4.  Patient will be able to navigate at least 3 steps with a reciprocal pattern for improved household independence.  Baseline:   Goal status: INITIAL  PLAN:  PT FREQUENCY: 2x/week  PT DURATION: 6 weeks  PLANNED INTERVENTIONS: 97164- PT Re-evaluation, 97110-Therapeutic exercises, 97530- Therapeutic activity, 97112- Neuromuscular re-education, 97535- Self Care, 81191- Manual therapy, [id number]- Gait training, 97014- Electrical stimulation (unattended), [phone]- Ultrasound, Patient/Family education, Balance training, Stair training, Spinal mobilization, Cryotherapy, and Moist heat.  PLAN FOR NEXT SESSION: Nustep, lumbar isometric, manual therapy, and modalities as needed   Kwaku Mostafa, [place], PT 08/13/2023, 1:58 PM

## 2023-08-18 ENCOUNTER — Ambulatory Visit: Payer: 59

## 2023-08-18 DIAGNOSIS — M5459 Other low back pain: Secondary | ICD-10-CM | POA: Diagnosis not present

## 2023-08-18 DIAGNOSIS — M6281 Muscle weakness (generalized): Secondary | ICD-10-CM | POA: Diagnosis not present

## 2023-08-18 DIAGNOSIS — M62830 Muscle spasm of back: Secondary | ICD-10-CM

## 2023-08-18 NOTE — Therapy (Signed)
OUTPATIENT PHYSICAL THERAPY THORACOLUMBAR TREATMENT   Patient Name: Cynthia Mays MRN: [id number] DOB:April 22, 1941, 82 y.o., female Today's Date: 08/18/2023  END OF SESSION:  PT End of Session - 08/18/23 1319     Visit Number 8    Number of Visits 12    Date for PT Re-Evaluation 10/02/23    PT Start Time 1316    PT Stop Time 1410    PT Time Calculation (min) 54 min    Activity Tolerance Patient tolerated treatment well    Behavior During Therapy [facility] for tasks assessed/performed               Past Medical History:  Diagnosis Date   Complication of anesthesia    Depression    Diabetes mellitus    Fibromyalgia    GERD (gastroesophageal reflux disease)    Hiatal hernia    Hyperlipidemia    Hypertension    Hypothyroidism    Hypothyroidism 07/23/2016   PONV (postoperative nausea and vomiting)    Past Surgical History:  Procedure Laterality Date   APPENDECTOMY  2002   arthroscopy  right 2002, left 2007   bilateral knees   BACK SURGERY  10/2007, 09/2008   x2   BIOPSY  10/02/2016   Procedure: BIOPSY;  Surgeon: Malissa Hippo, MD;  Location: [facility];  Service: Endoscopy;;  gastric   BIOPSY  05/04/2020   Procedure: BIOPSY;  Surgeon: Dolores Frame, MD;  Location: [facility];  Service: Gastroenterology;;   CARPAL TUNNEL RELEASE  1986   bilateral   CESAREAN SECTION  1969, 1967   x2   COLONOSCOPY WITH PROPOFOL N/A 05/04/2020   Castaneda:The examined portion of the ileum was normal(focally active, nonspecific ileitis, neg for features of chronicity or granulomas)- A single (solitary) ulcer in the cecum.(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 tortuous colon, non bleeding internal hemorrhoids   ESOPHAGOGASTRODUODENOSCOPY N/A 10/02/2016   Procedure: ESOPHAGOGASTRODUODENOSCOPY (EGD);  Surgeon: Malissa Hippo, MD;  Location: [facility];   Service: Endoscopy;  Laterality: N/A;  11:15 - moved to 2/1 @ 3:00   ESOPHAGOGASTRODUODENOSCOPY (EGD) WITH PROPOFOL N/A 05/04/2020   Castaneda: normal esophagus, 4cm hh with four cameron ulcers, multiple gastric polyps-fundic gland polyps, erythematous mucosa in pylorus, gastric antral mucosa and oxyntic mucosa with mild chronic gastritis and reactive changes, neg h pylori, normal examined duodenum   KNEE SURGERY     both   Patient Active Problem List   Diagnosis Date Noted   Intractable pain 03/04/2023   Closed compression fracture of L2 lumbar vertebra, initial encounter (HCC) 03/04/2023   Controlled substance agreement signed 08/15/2022   History of lumbar fusion 12/02/2021   Insomnia 05/14/2021   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   Colonic ulcer 08/13/2020   Obesity (BMI 30-39.9) 03/31/2019   Chronic use of benzodiazepine for therapeutic purpose 01/26/2018   Hiatal hernia 10/29/2017   Hyperlipidemia associated with type 2 diabetes mellitus (HCC) 12/31/2015   Coronary artery disease due to lipid rich plaque 12/31/2015   Left knee pain 10/24/2015   Macular degeneration, 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   Essential hypertension 02/20/2015   Chronic low back pain with sciatica 02/20/2015   Gastroesophageal reflux disease without esophagitis 11/20/2014   Anxiety, generalized 11/20/2014   Hypothyroidism 12/27/2013   DM (diabetes mellitus) (HCC) 12/27/2013   Precordial pain 01/15/2013   REFERRING PROVIDER: Floreen Comber, NP   REFERRING DIAG: Wedge compression fracture of  second lumbar vertebra, subsequent encounter for fracture with routine healing   Rationale for Evaluation and Treatment: Rehabilitation  THERAPY DIAG:  Other low back pain  Muscle weakness (generalized)  Muscle spasm of back  ONSET DATE: 03/03/23  SUBJECTIVE:                                                                                                                                                                                            SUBJECTIVE STATEMENT: Pt reports 8/10 left low back pain today.   PERTINENT HISTORY:  Hypertension, diabetes, anxiety, history of a lumbar fusion, depression, and fibromyalgia  PAIN:  Are you having pain? Yes: NPRS scale: 8/10 Pain location: left low back  Pain description: constant sharp, burning knife Aggravating factors: bending, any lifting, driving, and stairs Relieving factors: medication and not moving  PRECAUTIONS: Fall  RED FLAGS: None   WEIGHT BEARING RESTRICTIONS: No  FALLS:  Has patient fallen in last 6 months? Yes. Number of falls 1  LIVING ENVIRONMENT: Lives with: lives alone Lives in: House/apartment Stairs: Yes: Internal: 22 steps; on right going up and External: 3 steps; on right going up Has following equipment at home: None  OCCUPATION: retired  PLOF: Independent  PATIENT GOALS: reduced pain, and be able to do her yard work  NEXT MD VISIT: none scheduled  OBJECTIVE:  Note: Objective measures were completed at Evaluation unless otherwise noted.  PATIENT SURVEYS:  FOTO 36.20  SCREENING FOR RED FLAGS: Bowel or bladder incontinence: No Spinal tumors: No Cauda equina syndrome: No Compression fracture: No Abdominal aneurysm: No  COGNITION: Overall cognitive status: Within functional limits for tasks assessed     SENSATION: Patient reports no numbness or tingling.   POSTURE: rounded shoulders, forward head, decreased lumbar lordosis, increased thoracic kyphosis, posterior pelvic tilt, and flexed trunk   PALPATION: TTP: right QL, and left lumbar paraspinals (reproduced familiar pain)  LUMBAR ROM:   AROM eval  Flexion 8; limited by familiar pain  Extension 6; limited by familiar pain  Right lateral flexion   Left lateral flexion   Right rotation 75% limited   Left rotation 90% limited; familiar pain   (Blank rows = not tested)  LOWER EXTREMITY ROM: WFL for activities assessed  LOWER EXTREMITY MMT:     MMT Right eval Left eval  Hip flexion 3+/5 3/5; limited by pain  Hip extension    Hip abduction    Hip adduction    Hip internal rotation    Hip external rotation    Knee flexion 4/5 3+/5  Knee extension 4-/5 3/5; limited by pain  Ankle dorsiflexion 3+/5 3+/5  Ankle  plantarflexion    Ankle inversion    Ankle eversion     (Blank rows = not tested)  GAIT: Assistive device utilized: None Level of assistance: SBA Comments: flexed trunk with decreased gait speed and stride length   TODAY'S TREATMENT:                                                                                                                              DATE:    08/18/23:  EXERCISE LOG  Exercise Repetitions and Resistance Comments  Nustep  Level 4 x 15 minutes         Manual Therapy Soft Tissue Mobilization: left low back, STW/M to left SIJ region to decrease pain and tone.     Patient in right SDLY position with folded pillow between knees for comfort:   US/ estim combo x 10 mins 1.5 w/cm2 to LT side LB paras in RT side lying  HMP and IFC at 80-150 Hz on 40% scan x 15 minutes in seated position.  Normal modality response following removal of modality.                                   08/10/23 EXERCISE LOG  Exercise Repetitions and Resistance Comments  Nustep  L4 x 15 minutes    Blank cell = exercise not performed today  Modalities: no redness or adverse reaction to today's modalities  Date:  Unattended Estim: Lumbar, IFC @ 80-150 Hz w/ 40% scan, 14 mins, Pain and Tone Hot Pack: Lumbar, 14 mins, Pain and Tone   PATIENT EDUCATION:  Education details: healing and prognosis  Person educated: Patient Education method: Explanation Education comprehension: verbalized understanding  HOME EXERCISE PROGRAM:   ASSESSMENT:  CLINICAL IMPRESSION: Pt reports 8/10 left SIJ point pain.  Pt reports minimal improvement in left low back pain since beginning therapy.  Normal responses noted to all  modalities performed today.  STW/M performed to left SIJ region.  Pt reported decrease in pain at completion of today's treatment session.   OBJECTIVE IMPAIRMENTS: Abnormal gait, decreased activity tolerance, decreased balance, decreased mobility, difficulty walking, decreased ROM, decreased strength, hypomobility, impaired tone, postural dysfunction, and pain.   ACTIVITY LIMITATIONS: carrying, lifting, bending, standing, squatting, sleeping, stairs, transfers, bed mobility, reach over head, and locomotion level  PARTICIPATION LIMITATIONS: meal prep, cleaning, laundry, driving, shopping, community activity, and yard work  PERSONAL FACTORS: Age, Past/current experiences, Time since onset of injury/illness/exacerbation, and 3+ comorbidities: Hypertension, diabetes, anxiety, history of a lumbar fusion, depression, and fibromyalgia  are also affecting patient's functional outcome.   REHAB POTENTIAL: Good  CLINICAL DECISION MAKING: Evolving/moderate complexity  EVALUATION COMPLEXITY: Moderate   GOALS: Goals reviewed with patient? Yes  SHORT TERM GOALS: Target date: 08/10/23  Patient will be independent with her initial HEP.  Baseline: Goal status: MET  2.  Patient will be able to complete her daily activities  without her familiar pain exceeding 7/10. Baseline:  Goal status: MET  3.  Patient will improve her lumbar flexion to at least 15 degrees for improved function picking up items from the floor.  Baseline:  Goal status: On going  4.  Patient will be able to demonstrate at least 4-/5 muscular strength throughout her left lower extremity for improved function with her daily activities.  Baseline:  Goal status: On going  LONG TERM GOALS: Target date: 08/31/23  Patient will be independent with her advanced HEP.  Baseline:  Goal status: INITIAL  2.  Patient will be able to complete her daily activities without her familiar pain exceeding 5/10. Baseline:  Goal status:  INITIAL  3.  Patient will be able to pick up at least 5 pounds from the floor for improved function picking up her groceries.  Baseline:  Goal status: INITIAL  4.  Patient will be able to navigate at least 3 steps with a reciprocal pattern for improved household independence.  Baseline:  Goal status: INITIAL  PLAN:  PT FREQUENCY: 2x/week  PT DURATION: 6 weeks  PLANNED INTERVENTIONS: 97164- PT Re-evaluation, 97110-Therapeutic exercises, 97530- Therapeutic activity, 97112- Neuromuscular re-education, 97535- Self Care, 25956- Manual therapy, [id number]- Gait training, 97014- Electrical stimulation (unattended), [phone]- Ultrasound, Patient/Family education, Balance training, Stair training, Spinal mobilization, Cryotherapy, and Moist heat.  PLAN FOR NEXT SESSION: Nustep, lumbar isometric, manual therapy, and modalities as needed   Newman Pies, PTA 08/18/2023, 2:11 PM

## 2023-08-20 ENCOUNTER — Encounter: Payer: 59 | Admitting: Physical Therapy

## 2023-08-21 ENCOUNTER — Telehealth: Payer: Self-pay | Admitting: Family

## 2023-08-25 DIAGNOSIS — M170 Bilateral primary osteoarthritis of knee: Secondary | ICD-10-CM | POA: Diagnosis not present

## 2023-08-25 DIAGNOSIS — M1712 Unilateral primary osteoarthritis, left knee: Secondary | ICD-10-CM | POA: Diagnosis not present

## 2023-08-27 ENCOUNTER — Ambulatory Visit: Payer: 59 | Admitting: Family

## 2023-09-18 ENCOUNTER — Other Ambulatory Visit: Payer: Self-pay | Admitting: Obstetrics & Gynecology

## 2023-09-18 ENCOUNTER — Other Ambulatory Visit: Payer: Self-pay | Admitting: Family

## 2023-09-18 DIAGNOSIS — E1169 Type 2 diabetes mellitus with other specified complication: Secondary | ICD-10-CM

## 2023-09-18 DIAGNOSIS — R42 Dizziness and giddiness: Secondary | ICD-10-CM

## 2023-09-21 ENCOUNTER — Ambulatory Visit (INDEPENDENT_AMBULATORY_CARE_PROVIDER_SITE_OTHER): Payer: 59 | Admitting: Family

## 2023-09-21 ENCOUNTER — Encounter: Payer: Self-pay | Admitting: Family

## 2023-09-21 VITALS — BP 122/72 | HR 61 | Temp 97.0°F | Ht 59.0 in | Wt 178.0 lb

## 2023-09-21 DIAGNOSIS — I10 Essential (primary) hypertension: Secondary | ICD-10-CM

## 2023-09-21 DIAGNOSIS — Z79899 Other long term (current) drug therapy: Secondary | ICD-10-CM

## 2023-09-21 DIAGNOSIS — K219 Gastro-esophageal reflux disease without esophagitis: Secondary | ICD-10-CM | POA: Diagnosis not present

## 2023-09-21 DIAGNOSIS — E039 Hypothyroidism, unspecified: Secondary | ICD-10-CM

## 2023-09-21 DIAGNOSIS — E785 Hyperlipidemia, unspecified: Secondary | ICD-10-CM | POA: Diagnosis not present

## 2023-09-21 DIAGNOSIS — E1169 Type 2 diabetes mellitus with other specified complication: Secondary | ICD-10-CM

## 2023-09-21 DIAGNOSIS — M5440 Lumbago with sciatica, unspecified side: Secondary | ICD-10-CM

## 2023-09-21 DIAGNOSIS — Z981 Arthrodesis status: Secondary | ICD-10-CM

## 2023-09-21 DIAGNOSIS — G8929 Other chronic pain: Secondary | ICD-10-CM

## 2023-09-21 DIAGNOSIS — G4700 Insomnia, unspecified: Secondary | ICD-10-CM | POA: Diagnosis not present

## 2023-09-21 DIAGNOSIS — F411 Generalized anxiety disorder: Secondary | ICD-10-CM | POA: Diagnosis not present

## 2023-09-21 DIAGNOSIS — S32020G Wedge compression fracture of second lumbar vertebra, subsequent encounter for fracture with delayed healing: Secondary | ICD-10-CM | POA: Diagnosis not present

## 2023-09-21 LAB — BAYER DCA HB A1C WAIVED: HB A1C (BAYER DCA - WAIVED): 6.5 % — ABNORMAL HIGH (ref 4.8–5.6)

## 2023-09-21 MED ORDER — FARXIGA 5 MG PO TABS: 5.0000 mg | ORAL_TABLET | Freq: Every day | ORAL | 0 refills | Status: DC

## 2023-09-21 MED ORDER — DIAZEPAM 5 MG PO TABS: 5.0000 mg | ORAL_TABLET | Freq: Four times a day (QID) | ORAL | 2 refills | Status: DC | PRN

## 2023-09-21 MED ORDER — ONDANSETRON 8 MG PO TBDP: 8.0000 mg | ORAL_TABLET | Freq: Three times a day (TID) | ORAL | 0 refills | Status: AC | PRN

## 2023-09-21 MED ORDER — TRAZODONE HCL 50 MG PO TABS: 50.0000 mg | ORAL_TABLET | Freq: Every day | ORAL | 2 refills | Status: DC

## 2023-09-21 MED ORDER — DICLOFENAC SODIUM 75 MG PO TBEC: 75.0000 mg | DELAYED_RELEASE_TABLET | Freq: Two times a day (BID) | ORAL | 2 refills | Status: DC

## 2023-09-21 MED ORDER — PANTOPRAZOLE SODIUM 40 MG PO TBEC: 40.0000 mg | DELAYED_RELEASE_TABLET | Freq: Two times a day (BID) | ORAL | 0 refills | Status: DC

## 2023-09-21 MED ORDER — HYDROCODONE-ACETAMINOPHEN 5-325 MG PO TABS: 1.0000 | ORAL_TABLET | Freq: Four times a day (QID) | ORAL | 0 refills | Status: DC | PRN

## 2023-09-21 MED ORDER — LEVOTHYROXINE SODIUM 125 MCG PO TABS: 125.0000 ug | ORAL_TABLET | Freq: Every day | ORAL | 0 refills | Status: DC

## 2023-09-21 MED ORDER — SERTRALINE HCL 50 MG PO TABS: ORAL_TABLET | ORAL | 0 refills | Status: DC

## 2023-09-21 MED ORDER — ROSUVASTATIN CALCIUM 5 MG PO TABS: 5.0000 mg | ORAL_TABLET | Freq: Every day | ORAL | 3 refills | Status: DC

## 2023-09-21 NOTE — Patient Instructions (Signed)
Health Maintenance After Age 82 After age 82, you are at a higher risk for certain long-term diseases and infections as well as injuries from falls. Falls are a major cause of broken bones and head injuries in people who are older than age 82. Getting regular preventive care can help to keep you healthy and well. Preventive care includes getting regular testing and making lifestyle changes as recommended by your health care provider. Talk with your health care provider about: Which screenings and tests you should have. A screening is a test that checks for a disease when you have no symptoms. A diet and exercise plan that is right for you. What should I know about screenings and tests to prevent falls? Screening and testing are the best ways to find a health problem early. Early diagnosis and treatment give you the best chance of managing medical conditions that are common after age 82. Certain conditions and lifestyle choices may make you more likely to have a fall. Your health care provider may recommend: Regular vision checks. Poor vision and conditions such as cataracts can make you more likely to have a fall. If you wear glasses, make sure to get your prescription updated if your vision changes. Medicine review. Work with your health care provider to regularly review all of the medicines you are taking, including over-the-counter medicines. Ask your health care provider about any side effects that may make you more likely to have a fall. Tell your health care provider if any medicines that you take make you feel dizzy or sleepy. Strength and balance checks. Your health care provider may recommend certain tests to check your strength and balance while standing, walking, or changing positions. Foot health exam. Foot pain and numbness, as well as not wearing proper footwear, can make you more likely to have a fall. Screenings, including: Osteoporosis screening. Osteoporosis is a condition that causes  the bones to get weaker and break more easily. Blood pressure screening. Blood pressure changes and medicines to control blood pressure can make you feel dizzy. Depression screening. You may be more likely to have a fall if you have a fear of falling, feel depressed, or feel unable to do activities that you used to do. Alcohol use screening. Using too much alcohol can affect your balance and may make you more likely to have a fall. Follow these instructions at home: Lifestyle Do not drink alcohol if: Your health care provider tells you not to drink. If you drink alcohol: Limit how much you have to: 0-1 drink a day for women. 0-2 drinks a day for men. Know how much alcohol is in your drink. In the [place] one drink equals one 12 oz bottle of beer (355 mL), one 5 oz glass of wine (148 mL), or one 1 oz glass of hard liquor (44 mL). Do not use any products that contain nicotine or tobacco. These products include cigarettes, chewing tobacco, and vaping devices, such as e-cigarettes. If you need help quitting, ask your health care provider. Activity  Follow a regular exercise program to stay fit. This will help you maintain your balance. Ask your health care provider what types of exercise are appropriate for you. If you need a cane or walker, use it as recommended by your health care provider. Wear supportive shoes that have nonskid soles. Safety  Remove any tripping hazards, such as rugs, cords, and clutter. Install safety equipment such as grab bars in bathrooms and safety rails on stairs. Keep rooms and walkways   well-lit. General instructions Talk with your health care provider about your risks for falling. Tell your health care provider if: You fall. Be sure to tell your health care provider about all falls, even ones that seem minor. You feel dizzy, tiredness (fatigue), or off-balance. Take over-the-counter and prescription medicines only as told by your health care provider. These include  supplements. Eat a healthy diet and maintain a healthy weight. A healthy diet includes low-fat dairy products, low-fat (lean) meats, and fiber from whole grains, beans, and lots of fruits and vegetables. Stay current with your vaccines. Schedule regular health, dental, and eye exams. Summary Having a healthy lifestyle and getting preventive care can help to protect your health and wellness after age 82. Screening and testing are the best way to find a health problem early and help you avoid having a fall. Early diagnosis and treatment give you the best chance for managing medical conditions that are more common for people who are older than age 82. Falls are a major cause of broken bones and head injuries in people who are older than age 82. Take precautions to prevent a fall at home. Work with your health care provider to learn what changes you can make to improve your health and wellness and to prevent falls. This information is not intended to replace advice given to you by your health care provider. Make sure you discuss any questions you have with your health care provider. Document Revised: 01/07/2021 Document Reviewed: 01/07/2021 [employer] Patient Education  2024 [employer]  
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
## 2023-09-22 LAB — CMP14+EGFR
ALT: 11 [IU]/L (ref 0–32)
AST: 19 [IU]/L (ref 0–40)
Albumin: 4 g/dL (ref 3.7–4.7)
Alkaline Phosphatase: 97 [IU]/L (ref 44–121)
BUN/Creatinine Ratio: 19 (ref 12–28)
BUN: 18 mg/dL (ref 8–27)
Bilirubin Total: 0.3 mg/dL (ref 0.0–1.2)
CO2: 24 mmol/L (ref 20–29)
Calcium: 8.7 mg/dL (ref 8.7–10.3)
Chloride: 106 mmol/L (ref 96–106)
Creatinine, Ser: 0.97 mg/dL (ref 0.57–1.00)
Globulin, Total: 3.1 g/dL (ref 1.5–4.5)
Glucose: 107 mg/dL — ABNORMAL HIGH (ref 70–99)
Potassium: 4.2 mmol/L (ref 3.5–5.2)
Sodium: 141 mmol/L (ref 134–144)
Total Protein: 7.1 g/dL (ref 6.0–8.5)
eGFR: 59 mL/min/{1.73_m2} — ABNORMAL LOW (ref >59)

## 2023-09-27 ENCOUNTER — Ambulatory Visit (HOSPITAL_COMMUNITY)
Admission: RE | Admit: 2023-09-27 | Discharge: 2023-09-27 | Disposition: A | Discharge status: Home / Self Care | Payer: 59 | Source: Ambulatory Visit | Attending: Family | Admitting: Family

## 2023-09-27 DIAGNOSIS — R911 Solitary pulmonary nodule: Secondary | ICD-10-CM | POA: Insufficient documentation

## 2023-09-27 DIAGNOSIS — R918 Other nonspecific abnormal finding of lung field: Secondary | ICD-10-CM | POA: Diagnosis not present

## 2023-09-28 ENCOUNTER — Telehealth: Payer: Self-pay | Admitting: Family Medicine

## 2023-09-28 NOTE — Telephone Encounter (Signed)
Follow up on lung nodules. The scan is still pending.

## 2023-09-28 NOTE — Telephone Encounter (Signed)
Patient aware and verbalized understanding.
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
## 2023-10-05 DIAGNOSIS — M170 Bilateral primary osteoarthritis of knee: Secondary | ICD-10-CM | POA: Diagnosis not present

## 2023-10-12 DIAGNOSIS — M170 Bilateral primary osteoarthritis of knee: Secondary | ICD-10-CM | POA: Diagnosis not present

## 2023-10-19 DIAGNOSIS — M170 Bilateral primary osteoarthritis of knee: Secondary | ICD-10-CM | POA: Diagnosis not present

## 2023-11-24 ENCOUNTER — Other Ambulatory Visit: Payer: Self-pay | Admitting: Family

## 2023-11-24 DIAGNOSIS — G8929 Other chronic pain: Secondary | ICD-10-CM

## 2023-12-01 DIAGNOSIS — S32020D Wedge compression fracture of second lumbar vertebra, subsequent encounter for fracture with routine healing: Secondary | ICD-10-CM | POA: Diagnosis not present

## 2023-12-07 ENCOUNTER — Ambulatory Visit (INDEPENDENT_AMBULATORY_CARE_PROVIDER_SITE_OTHER): Payer: 59

## 2023-12-07 VITALS — Ht 59.0 in | Wt 178.0 lb

## 2023-12-07 DIAGNOSIS — Z0000 Encounter for general adult medical examination without abnormal findings: Secondary | ICD-10-CM | POA: Diagnosis not present

## 2023-12-07 NOTE — Patient Instructions (Signed)
 Cynthia Mays , Thank you for taking time to come for your Medicare Wellness Visit. I appreciate your ongoing commitment to your health goals. Please review the following plan we discussed and let me know if I can assist you in the future.   Referrals/Orders/Follow-Ups/Clinician Recommendations: Aim for 30 minutes of exercise or brisk walking, 6-8 glasses of water, and 5 servings of fruits and vegetables each day  This is a list of the screening recommended for you and due dates:  Health Maintenance  Topic Date Due   COVID-19 Vaccine (4 - 2024-25 season) 05/03/2023   Eye exam for diabetics  11/04/2023   Complete foot exam   11/21/2023   Hemoglobin A1C  03/20/2024   Flu Shot  04/01/2024   Yearly kidney health urinalysis for diabetes  05/25/2024   DTaP/Tdap/Td vaccine (3 - Td or Tdap) 07/31/2024   Yearly kidney function blood test for diabetes  09/20/2024   Medicare Annual Wellness Visit  12/06/2024   DEXA scan (bone density measurement)  05/25/2025   Pneumonia Vaccine  Completed   Zoster (Shingles) Vaccine  Completed   HPV Vaccine  Aged Out   Hepatitis C Screening  Discontinued    Advanced directives: (ACP Link)Information on Advanced Care Planning can be found at [facility] Advance Health Care Directives Advance Health Care Directives. [email or web address]   Next Medicare Annual Wellness Visit scheduled for next year: Yes

## 2023-12-07 NOTE — Progress Notes (Signed)
 Subjective:   Cynthia Mays is a 83 y.o. who presents for a Medicare Wellness preventive visit.  Visit Complete: Virtual I connected with  Cynthia Mays on 12/07/23 by a audio enabled telemedicine application and verified that I am speaking with the correct person using two identifiers.  Patient Location: Home  Provider Location: Home Office  I discussed the limitations of evaluation and management by telemedicine. The patient expressed understanding and agreed to proceed.  Vital Signs: Because this visit was a virtual/telehealth visit, some criteria may be missing or patient reported. Any vitals not documented were not able to be obtained and vitals that have been documented are patient reported.  VideoDeclined- This patient declined Librarian, academic. Therefore the visit was completed with audio only.  Persons Participating in Visit: Patient.  AWV Questionnaire: No: Patient Medicare AWV questionnaire was not completed prior to this visit.  Cardiac Risk Factors include: advanced age (>36men, >81 women);diabetes mellitus;dyslipidemia     Objective:    Today's Vitals   12/07/23 1431  Weight: 178 lb (80.7 kg)  Height: 4\' 11"  (1.499 m)   Body mass index is 35.95 kg/m.     12/07/2023    3:39 PM 07/20/2023   10:43 AM 03/04/2023    5:00 AM 03/03/2023    9:06 PM 12/04/2022   11:24 AM 09/16/2022    8:28 PM 08/26/2022   11:42 AM  Advanced Directives  Does Patient Have a Medical Advance Directive? No Yes No No Yes No No  Type of Merchandiser, retail of [place];Living will    Copy of Healthcare Power of Attorney in Chart?     No - copy requested    Would patient like information on creating a medical advance directive? Yes (MAU/Ambulatory/Procedural Areas - Information given)  No - Patient declined No - Patient declined       Current Medications (verified) Outpatient Encounter Medications as of 12/07/2023  Medication Sig    CALCIUM PO Take by mouth 1 day or 1 dose.   cetirizine (ZYRTEC ALLERGY) 10 MG tablet Take 1 tablet (10 mg total) by mouth daily.   cholecalciferol (VITAMIN D3) 25 MCG (1000 UNIT) tablet Take 1,000 Units by mouth daily.    clobetasol cream (TEMOVATE) 0.05 % APPLY  CREAM TOPICALLY TO AFFECTED AREA ONCE DAILY AS NEEDED FOR RASH   diazepam (VALIUM) 5 MG tablet Take 1 tablet (5 mg total) by mouth every 6 (six) hours as needed for anxiety or muscle spasms.   diclofenac (VOLTAREN) 75 MG EC tablet Take 1 tablet (75 mg total) by mouth 2 (two) times daily.   FARXIGA 5 MG TABS tablet Take 1 tablet (5 mg total) by mouth daily before breakfast.   fluticasone (FLONASE) 50 MCG/ACT nasal spray Place 2 sprays into the nose daily.   gabapentin (NEURONTIN) 300 MG capsule TAKE 1 CAPSULE BY MOUTH THREE TIMES DAILY   HYDROcodone-acetaminophen (NORCO/VICODIN) 5-325 MG tablet Take 1 tablet by mouth every 6 (six) hours as needed for moderate pain (pain score 4-6).   HYDROcodone-acetaminophen (NORCO/VICODIN) 5-325 MG tablet Take 1 tablet by mouth every 6 (six) hours as needed for moderate pain (pain score 4-6).   HYDROcodone-acetaminophen (NORCO/VICODIN) 5-325 MG tablet Take 1 tablet by mouth every 6 (six) hours as needed for moderate pain (pain score 4-6).   levothyroxine (SYNTHROID) 125 MCG tablet Take 1 tablet (125 mcg total) by mouth daily.   meclizine (ANTIVERT) 12.5 MG tablet TAKE 1 TABLET BY  MOUTH THREE TIMES DAILY AS NEEDED FOR DIZZINESS   mesalamine (LIALDA) 1.2 g EC tablet TAKE 2 TABLETS BY MOUTH ONCE DAILY WITH BREAKFAST   Multiple Vitamins-Minerals (MULTIVITAMINS THER. W/MINERALS) TABS Take 1 tablet by mouth daily.   NIFEdipine (PROCARDIA-XL/NIFEDICAL-XL) 30 MG 24 hr tablet Take 1 tablet (30 mg total) by mouth daily.   ondansetron (ZOFRAN ODT) 8 MG disintegrating tablet Take 1 tablet (8 mg total) by mouth every 8 (eight) hours as needed for nausea or vomiting.   pantoprazole (PROTONIX) 40 MG tablet Take 1 tablet  (40 mg total) by mouth 2 (two) times daily.   Probiotic Product (PHILLIPS COLON HEALTH) CAPS Take 1 capsule by mouth daily.    rosuvastatin (CRESTOR) 5 MG tablet Take 1 tablet (5 mg total) by mouth daily.   sertraline (ZOLOFT) 50 MG tablet TAKE 1 & 1/2 (ONE & ONE-HALF) TABLETS BY MOUTH ONCE DAILY   traZODone (DESYREL) 50 MG tablet Take 1-2 tablets (50-100 mg total) by mouth at bedtime.   No facility-administered encounter medications on file as of 12/07/2023.    Allergies (verified) Codeine, Penicillins, and Pravastatin   History: Past Medical History:  Diagnosis Date   Complication of anesthesia    Depression    Diabetes mellitus    Fibromyalgia    GERD (gastroesophageal reflux disease)    Hiatal hernia    Hyperlipidemia    Hypertension    Hypothyroidism    Hypothyroidism 07/23/2016   PONV (postoperative nausea and vomiting)    Past Surgical History:  Procedure Laterality Date   APPENDECTOMY  2002   arthroscopy  right 2002, left 2007   bilateral knees   BACK SURGERY  10/2007, 09/2008   x2   BIOPSY  10/02/2016   Procedure: BIOPSY;  Surgeon: Malissa Hippo, MD;  [facility]: [facility];  Service: Endoscopy;;  gastric   BIOPSY  05/04/2020   Procedure: BIOPSY;  Surgeon: Dolores Frame, MD;  Location: [facility];  Service: Gastroenterology;;   CARPAL TUNNEL RELEASE  1986   bilateral   CESAREAN SECTION  1969, 1967   x2   COLONOSCOPY WITH PROPOFOL N/A 05/04/2020   Castaneda:The examined portion of the ileum was normal(focally active, nonspecific ileitis, neg for features of chronicity or granulomas)- A single (solitary) ulcer in the cecum.(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 tortuous colon, non bleeding internal hemorrhoids   ESOPHAGOGASTRODUODENOSCOPY N/A 10/02/2016   Procedure: ESOPHAGOGASTRODUODENOSCOPY (EGD);  Surgeon: Malissa Hippo, MD;  Location:  [facility];  Service: Endoscopy;  Laterality: N/A;  11:15 - moved to 2/1 @ 3:00   ESOPHAGOGASTRODUODENOSCOPY (EGD) WITH PROPOFOL N/A 05/04/2020   Castaneda: normal esophagus, 4cm hh with four cameron ulcers, multiple gastric polyps-fundic gland polyps, erythematous mucosa in pylorus, gastric antral mucosa and oxyntic mucosa with mild chronic gastritis and reactive changes, neg h pylori, normal examined duodenum   KNEE SURGERY     both   Family History  Problem Relation Age of Onset   Anesthesia problems Mother    CAD Mother 69   Cancer Mother        Pancreatic cancer   Hypertension Father    Suicidality Father        gunshot   Deafness Sister    Heart disease Brother    Suicidality Son    Cancer Sister        female    Suicidality Brother    Hypotension Neg Hx  Pseudochol deficiency Neg Hx    Malignant hyperthermia Neg Hx    Social History   Socioeconomic History   Marital status: Widowed    Spouse name: Not on file   Number of children: 4   Years of education: 50   Highest education level: 12th grade  Occupational History    Employer: RETIRED  Tobacco Use   Smoking status: Former    Current packs/day: 0.00    Types: Cigarettes    Quit date: 05/02/1970    Years since quitting: 53.6   Smokeless tobacco: Never   Tobacco comments:    > 20 years quit  Vaping Use   Vaping status: Never Used  Substance and Sexual Activity   Alcohol use: No    Comment: occasionally wine   Drug use: No   Sexual activity: Not Currently    Birth control/protection: Post-menopausal  Other Topics Concern   Not on file  Social History Narrative   Lives alone.  Widowed    1 dog, 2 cats   2 daughters, 1 local   Enjoys dancing and sewing   Daughter lives 5 miles away, grandchildren grown and visit often   Social Drivers of Corporate investment banker Strain: Low Risk  (12/07/2023)   Overall Financial Resource Strain (CARDIA)    Difficulty of Paying Living Expenses: Not hard at all   Food Insecurity: No Food Insecurity (12/07/2023)   Hunger Vital Sign    Worried About Running Out of Food in the Last Year: Never true    Ran Out of Food in the Last Year: Never true  Transportation Needs: No Transportation Needs (12/07/2023)   PRAPARE - Administrator, Civil Service (Medical): No    Lack of Transportation (Non-Medical): No  Physical Activity: Insufficiently Active (12/07/2023)   Exercise Vital Sign    Days of Exercise per Week: 3 days    Minutes of Exercise per Session: 30 min  Stress: No Stress Concern Present (12/07/2023)   [employer] of Occupational Health - Occupational Stress Questionnaire    Feeling of Stress : Not at all  Social Connections: Socially Isolated (12/07/2023)   Social Connection and Isolation Panel [NHANES]    Frequency of Communication with Friends and Family: More than three times a week    Frequency of Social Gatherings with Friends and Family: Three times a week    Attends Religious Services: Never    Active Member of Clubs or Organizations: No    Attends Banker Meetings: Never    Marital Status: Widowed    Tobacco Counseling Counseling given: Not Answered Tobacco comments: > 20 years quit    Clinical Intake:  Pre-visit preparation completed: Yes  Pain : No/denies pain     Diabetes: No  Lab Results  Component Value Date   HGBA1C 6.5 (H) 09/21/2023   HGBA1C 6.2 (H) 05/26/2023   HGBA1C 6.1 (H) 02/23/2023     How often do you need to have someone help you when you read instructions, pamphlets, or other written materials from your doctor or pharmacy?: 1 - Never  Interpreter Needed?: No  Information entered by :: Kandis Fantasia LPN   Activities of Daily Living     12/07/2023    3:38 PM 03/04/2023    5:00 AM  In your present state of health, do you have any difficulty performing the following activities:  Hearing? 0 0  Vision? 0 0  Difficulty concentrating or making decisions? 0 0  Walking or  climbing stairs? 0 1  Dressing or bathing? 0 1  Doing errands, shopping? 0 0  Preparing Food and eating ? N   Using the Toilet? N   In the past six months, have you accidently leaked urine? N   Do you have problems with loss of bowel control? N   Managing your Medications? N   Managing your Finances? N   Housekeeping or managing your Housekeeping? N     Patient Care Team: Junie Spencer, FNP as PCP - General (Family Medicine) Danella Maiers, [facility] as Pharmacist (Family Medicine) Dairl Ponder, MD as Consulting Physician (Orthopedic Surgery) Adam Phenix, DPM as Consulting Physician (Podiatry) Despina Hidden Amaryllis Dyke, MD as Consulting Physician (Obstetrics and Gynecology) Michaelle Copas, MD as Referring Physician (Optometry)  Indicate any recent Medical Services you may have received from other than [facility] providers in the past year (date may be approximate).     Assessment:   This is a routine wellness examination for [place].  Hearing/Vision screen Hearing Screening - Comments:: Denies hearing difficulties   Vision Screening - Comments:: Wears rx glasses - up to date with routine eye exams with [facility] [place]    Goals Addressed   None    Depression Screen     12/07/2023    3:02 PM 05/26/2023    9:57 AM 02/23/2023   10:06 AM 12/04/2022   11:23 AM 11/21/2022   10:18 AM 08/15/2022   10:02 AM 02/11/2022   10:54 AM  PHQ 2/9 Scores  PHQ - 2 Score 2 2 4  0 0 0 1  PHQ- 9 Score 10 10 7  0 3 6 4     Fall Risk     12/07/2023    3:03 PM 09/21/2023   10:29 AM 02/23/2023   10:06 AM 12/04/2022   11:22 AM 11/21/2022   10:15 AM  Fall Risk   Falls in the past year? 0 0 0 0 1  Number falls in past yr: 0 0  0 0  Injury with Fall? 0 0  0 0  Risk for fall due to : No Fall Risks No Fall Risks  No Fall Risks Impaired mobility  Follow up Falls prevention discussed;Education provided;Falls evaluation completed Falls evaluation completed;Education provided  Falls prevention discussed Falls  evaluation completed    MEDICARE RISK AT HOME:  Medicare Risk at Home Any stairs in or around the home?: No If so, are there any without handrails?: No Home free of loose throw rugs in walkways, pet beds, electrical cords, etc?: Yes Adequate lighting in your home to reduce risk of falls?: Yes Life alert?: No Use of a cane, walker or w/c?: No Grab bars in the bathroom?: Yes Shower chair or bench in shower?: No Elevated toilet seat or a handicapped toilet?: Yes  TIMED UP AND GO:  Was the test performed?  No  Cognitive Function: 6CIT completed        12/07/2023    3:38 PM 12/04/2022   11:25 AM 12/02/2021   12:21 PM 11/29/2019    9:10 AM  6CIT Screen  What Year? 0 points 0 points 0 points 0 points  What month? 0 points 0 points 0 points 0 points  What time? 0 points 0 points 0 points 0 points  Count back from 20 0 points 0 points 0 points 0 points  Months in reverse 0 points 0 points 0 points 2 points  Repeat phrase 0 points 0 points 2 points 4 points  Total Score 0 points  0 points 2 points 6 points    Immunizations Immunization History  Administered Date(s) Administered   Fluad Quad(high Dose 65+) 05/24/2020, 05/16/2022   Fluad Trivalent(High Dose 65+) 05/26/2023   Influenza Split 06/05/2017   Influenza, High Dose Seasonal PF 06/13/2013, 06/18/2016, 06/21/2018, 05/03/2019   Influenza, Seasonal, Injecte, Preservative Fre 05/09/2014   Influenza-Unspecified 06/01/2021   Moderna Sars-Covid-2 Vaccination 09/13/2019, 10/14/2019, 08/29/2020   PFIZER SARS-COV-2 Pediatric Vaccination 5-17yrs 06/06/2020   Pneumococcal Conjugate-13 10/01/2015   Pneumococcal Polysaccharide-23 10/01/2007   Pneumococcal-Unspecified 06/05/2017   Td,absorbed, Preservative Free, Adult Use, Lf Unspecified 07/17/2000   Tdap 07/17/2000, 07/31/2014   Zoster Recombinant(Shingrix) 05/14/2021, 11/11/2021    Screening Tests Health Maintenance  Topic Date Due   COVID-19 Vaccine (4 - 2024-25 season) 05/03/2023    OPHTHALMOLOGY EXAM  11/04/2023   FOOT EXAM  11/21/2023   HEMOGLOBIN A1C  03/20/2024   INFLUENZA VACCINE  04/01/2024   Diabetic kidney evaluation - Urine ACR  05/25/2024   DTaP/Tdap/Td (3 - Td or Tdap) 07/31/2024   Diabetic kidney evaluation - eGFR measurement  09/20/2024   Medicare Annual Wellness (AWV)  12/06/2024   DEXA SCAN  05/25/2025   Pneumonia Vaccine 68+ Years old  Completed   Zoster Vaccines- Shingrix  Completed   HPV VACCINES  Aged Out   Hepatitis C Screening  Discontinued    Health Maintenance  Health Maintenance Due  Topic Date Due   COVID-19 Vaccine (4 - 2024-25 season) 05/03/2023   OPHTHALMOLOGY EXAM  11/04/2023   FOOT EXAM  11/21/2023   Health Maintenance Items Addressed: Patient will schedule diabetic eye exam soon   Additional Screening:  Vision Screening: Recommended annual ophthalmology exams for early detection of glaucoma and other disorders of the eye.  Dental Screening: Recommended annual dental exams for proper oral hygiene  Community Resource Referral / Chronic Care Management: CRR required this visit?  No   CCM required this visit?  No     Plan:     I have personally reviewed and noted the following in the patient's chart:   Medical and social history Use of alcohol, tobacco or illicit drugs  Current medications and supplements including opioid prescriptions. Patient is not currently taking opioid prescriptions. Functional ability and status Nutritional status Physical activity Advanced directives List of other physicians Hospitalizations, surgeries, and ER visits in previous 12 months Vitals Screenings to include cognitive, depression, and falls Referrals and appointments  In addition, I have reviewed and discussed with patient certain preventive protocols, quality metrics, and best practice recommendations. A written personalized care plan for preventive services as well as general preventive health recommendations were provided  to patient.     Kandis Fantasia [place], [place]   10/06/3662   After Visit Summary: (MyChart) Due to this being a telephonic visit, the after visit summary with patients personalized plan was offered to patient via MyChart   Notes: Nothing significant to report at this time.

## 2023-12-14 ENCOUNTER — Other Ambulatory Visit: Payer: Self-pay | Admitting: Family

## 2023-12-14 DIAGNOSIS — K529 Noninfective gastroenteritis and colitis, unspecified: Secondary | ICD-10-CM

## 2023-12-14 DIAGNOSIS — K633 Ulcer of intestine: Secondary | ICD-10-CM

## 2023-12-21 ENCOUNTER — Ambulatory Visit (INDEPENDENT_AMBULATORY_CARE_PROVIDER_SITE_OTHER): Payer: 59 | Admitting: Family

## 2023-12-21 ENCOUNTER — Encounter: Payer: Self-pay | Admitting: Family

## 2023-12-21 VITALS — BP 113/72 | HR 68 | Temp 97.6°F | Ht 59.0 in | Wt 178.0 lb

## 2023-12-21 DIAGNOSIS — Z79899 Other long term (current) drug therapy: Secondary | ICD-10-CM | POA: Diagnosis not present

## 2023-12-21 DIAGNOSIS — E039 Hypothyroidism, unspecified: Secondary | ICD-10-CM

## 2023-12-21 DIAGNOSIS — R42 Dizziness and giddiness: Secondary | ICD-10-CM

## 2023-12-21 DIAGNOSIS — F411 Generalized anxiety disorder: Secondary | ICD-10-CM

## 2023-12-21 DIAGNOSIS — E1169 Type 2 diabetes mellitus with other specified complication: Secondary | ICD-10-CM

## 2023-12-21 DIAGNOSIS — E785 Hyperlipidemia, unspecified: Secondary | ICD-10-CM | POA: Diagnosis not present

## 2023-12-21 DIAGNOSIS — G8929 Other chronic pain: Secondary | ICD-10-CM

## 2023-12-21 DIAGNOSIS — M5441 Lumbago with sciatica, right side: Secondary | ICD-10-CM

## 2023-12-21 DIAGNOSIS — S32020G Wedge compression fracture of second lumbar vertebra, subsequent encounter for fracture with delayed healing: Secondary | ICD-10-CM | POA: Diagnosis not present

## 2023-12-21 DIAGNOSIS — Z981 Arthrodesis status: Secondary | ICD-10-CM

## 2023-12-21 DIAGNOSIS — G4700 Insomnia, unspecified: Secondary | ICD-10-CM

## 2023-12-21 DIAGNOSIS — K219 Gastro-esophageal reflux disease without esophagitis: Secondary | ICD-10-CM

## 2023-12-21 DIAGNOSIS — I10 Essential (primary) hypertension: Secondary | ICD-10-CM | POA: Diagnosis not present

## 2023-12-21 LAB — BAYER DCA HB A1C WAIVED: HB A1C (BAYER DCA - WAIVED): 6.1 % — ABNORMAL HIGH (ref 4.8–5.6)

## 2023-12-21 MED ORDER — FARXIGA 5 MG PO TABS: 5.0000 mg | ORAL_TABLET | Freq: Every day | ORAL | 0 refills | Status: DC

## 2023-12-21 MED ORDER — TRAZODONE HCL 50 MG PO TABS: 50.0000 mg | ORAL_TABLET | Freq: Every day | ORAL | 2 refills | Status: AC

## 2023-12-21 MED ORDER — ROSUVASTATIN CALCIUM 5 MG PO TABS: 5.0000 mg | ORAL_TABLET | Freq: Every day | ORAL | 3 refills | Status: DC

## 2023-12-21 MED ORDER — HYDROCODONE-ACETAMINOPHEN 5-325 MG PO TABS: 1.0000 | ORAL_TABLET | Freq: Four times a day (QID) | ORAL | 0 refills | Status: DC | PRN

## 2023-12-21 MED ORDER — HYDROCODONE-ACETAMINOPHEN 5-325 MG PO TABS
1.0000 | ORAL_TABLET | Freq: Four times a day (QID) | ORAL | 0 refills | Status: DC | PRN
Start: 2024-02-16 — End: 2024-04-25

## 2023-12-21 MED ORDER — NIFEDIPINE ER OSMOTIC RELEASE 30 MG PO TB24: 30.0000 mg | ORAL_TABLET | Freq: Every day | ORAL | 3 refills | Status: AC

## 2023-12-21 MED ORDER — GABAPENTIN 300 MG PO CAPS: 300.0000 mg | ORAL_CAPSULE | Freq: Three times a day (TID) | ORAL | 0 refills | Status: DC

## 2023-12-21 MED ORDER — DIAZEPAM 5 MG PO TABS: 5.0000 mg | ORAL_TABLET | Freq: Four times a day (QID) | ORAL | 2 refills | Status: DC | PRN

## 2023-12-21 MED ORDER — PANTOPRAZOLE SODIUM 40 MG PO TBEC: 40.0000 mg | DELAYED_RELEASE_TABLET | Freq: Two times a day (BID) | ORAL | 0 refills | Status: DC

## 2023-12-21 MED ORDER — SERTRALINE HCL 50 MG PO TABS: ORAL_TABLET | ORAL | 0 refills | Status: DC

## 2023-12-21 MED ORDER — MECLIZINE HCL 12.5 MG PO TABS: 12.5000 mg | ORAL_TABLET | Freq: Three times a day (TID) | ORAL | 2 refills | Status: DC | PRN

## 2023-12-21 NOTE — Patient Instructions (Signed)
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
## 2023-12-21 NOTE — Addendum Note (Signed)
 Addended by: Lisbeth Rides on: 12/21/2023 10:13 AM   Modules accepted: Orders

## 2023-12-22 LAB — CMP14+EGFR
ALT: 19 IU/L (ref 0–32)
AST: 29 IU/L (ref 0–40)
Albumin: 4.1 g/dL (ref 3.7–4.7)
Alkaline Phosphatase: 94 IU/L (ref 44–121)
BUN/Creatinine Ratio: 17 (ref 12–28)
BUN: 16 mg/dL (ref 8–27)
Bilirubin Total: 0.4 mg/dL (ref 0.0–1.2)
CO2: 21 mmol/L (ref 20–29)
Calcium: 9.2 mg/dL (ref 8.7–10.3)
Chloride: 107 mmol/L — ABNORMAL HIGH (ref 96–106)
Creatinine, Ser: 0.94 mg/dL (ref 0.57–1.00)
Globulin, Total: 3.2 g/dL (ref 1.5–4.5)
Glucose: 139 mg/dL — ABNORMAL HIGH (ref 70–99)
Potassium: 4.1 mmol/L (ref 3.5–5.2)
Sodium: 141 mmol/L (ref 134–144)
Total Protein: 7.3 g/dL (ref 6.0–8.5)
eGFR: 61 mL/min/{1.73_m2} (ref >59)

## 2023-12-22 LAB — TSH: TSH: 0.301 u[IU]/mL — ABNORMAL LOW (ref 0.450–4.500)

## 2023-12-23 ENCOUNTER — Telehealth: Payer: Self-pay | Admitting: Orthopaedic Surgery

## 2023-12-23 NOTE — Telephone Encounter (Signed)
 Tried to return the pt's call to schedule an appointment for her knee, lvm for her to cb to schedule.

## 2023-12-29 LAB — BENZODIAZEPINES,MS,WB/SP RFX
7-Aminoclonazepam: NEGATIVE ng/mL
Alprazolam: NEGATIVE ng/mL
Benzodiazepines Confirm: POSITIVE
Chlordiazepoxide: NEGATIVE
Clonazepam: NEGATIVE ng/mL
Desalkylflurazepam: NEGATIVE ng/mL
Desmethylchlordiazepoxide: NEGATIVE
Desmethyldiazepam: 116 ng/mL
Diazepam: 52 ng/mL
Flurazepam: NEGATIVE ng/mL
Lorazepam: NEGATIVE ng/mL
Midazolam: NEGATIVE ng/mL
Oxazepam: NEGATIVE ng/mL
Temazepam: NEGATIVE ng/mL
Triazolam: NEGATIVE ng/mL

## 2023-12-29 LAB — DRUG SCREEN 10 W/CONF, SERUM
Amphetamines, IA: NEGATIVE ng/mL
Barbiturates, IA: NEGATIVE ug/mL
Benzodiazepines, IA: POSITIVE ng/mL — AB
Cocaine & Metabolite, IA: NEGATIVE ng/mL
Methadone, IA: NEGATIVE ng/mL
Opiates, IA: NEGATIVE ng/mL
Oxycodones, IA: NEGATIVE ng/mL
Phencyclidine, IA: NEGATIVE ng/mL
Propoxyphene, IA: NEGATIVE ng/mL
THC(Marijuana) Metabolite, IA: NEGATIVE ng/mL

## 2023-12-31 ENCOUNTER — Encounter: Payer: Self-pay | Admitting: Orthopaedic Surgery

## 2023-12-31 ENCOUNTER — Other Ambulatory Visit: Payer: Self-pay

## 2023-12-31 ENCOUNTER — Ambulatory Visit: Admitting: Orthopaedic Surgery

## 2023-12-31 DIAGNOSIS — M25562 Pain in left knee: Secondary | ICD-10-CM | POA: Diagnosis not present

## 2023-12-31 NOTE — Progress Notes (Signed)
 My knee hurts.  She fell about ten days or so ago and hurt her left knee.  She saw her primary care who told her to come here.  She has swelling at times, popping but no giving way.  She has pain medicine she takes at night that has helped the pain.  She has taken Tylenol  also which helped little.  She has no redness, no numbness.  Left knee has slight effusion, crepitus, ROM 0 to 110, stable, NV intact, no distal edema.  X-rays of the left knee were done, reported separately.  X-rays show moderate degenerative changes of all compartments with medial narrowing bilaterally, osteophytes.  No fracture or loose body noted.  Bone quality is good.  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  Encounter Diagnosis  Name Primary?   Acute pain of left knee Yes   PROCEDURE NOTE:  The patient requests injections of the left knee , verbal consent was obtained.  The left knee was prepped appropriately after time out was performed.   Sterile technique was observed and injection of 1 cc of DepoMedrol 40 mg with several cc's of plain xylocaine . Anesthesia was provided by ethyl chloride and a 20-gauge needle was used to inject the knee area. The injection was tolerated well.  A band aid dressing was applied.  The patient was advised to apply ice later today and tomorrow to the injection sight as needed.  Return in two weeks.  If doing well, cancel.  Call if any problem.  Precautions discussed.  Electronically Signed Pleasant Brilliant, MD 5/1/20259:22 AM

## 2024-01-05 DIAGNOSIS — L821 Other seborrheic keratosis: Secondary | ICD-10-CM | POA: Diagnosis not present

## 2024-01-14 ENCOUNTER — Ambulatory Visit: Admitting: Orthopaedic Surgery

## 2024-02-09 LAB — HM DIABETES EYE EXAM

## 2024-02-22 ENCOUNTER — Other Ambulatory Visit: Payer: Self-pay | Admitting: Family

## 2024-02-22 DIAGNOSIS — E039 Hypothyroidism, unspecified: Secondary | ICD-10-CM

## 2024-03-02 ENCOUNTER — Other Ambulatory Visit: Payer: Self-pay | Admitting: Orthopedic Surgery

## 2024-03-02 DIAGNOSIS — R2231 Localized swelling, mass and lump, right upper limb: Secondary | ICD-10-CM | POA: Diagnosis not present

## 2024-03-02 DIAGNOSIS — M79641 Pain in right hand: Secondary | ICD-10-CM | POA: Diagnosis not present

## 2024-03-14 ENCOUNTER — Other Ambulatory Visit: Payer: Self-pay | Admitting: Family

## 2024-03-14 DIAGNOSIS — K529 Noninfective gastroenteritis and colitis, unspecified: Secondary | ICD-10-CM

## 2024-03-14 DIAGNOSIS — K633 Ulcer of intestine: Secondary | ICD-10-CM

## 2024-03-15 ENCOUNTER — Other Ambulatory Visit: Payer: Self-pay

## 2024-03-15 ENCOUNTER — Encounter (HOSPITAL_BASED_OUTPATIENT_CLINIC_OR_DEPARTMENT_OTHER): Payer: Self-pay | Admitting: Orthopedic Surgery

## 2024-03-17 ENCOUNTER — Encounter (HOSPITAL_BASED_OUTPATIENT_CLINIC_OR_DEPARTMENT_OTHER)
Admission: RE | Admit: 2024-03-17 | Discharge: 2024-03-17 | Disposition: A | Discharge status: Home / Self Care | Source: Ambulatory Visit | Attending: Orthopedic Surgery | Admitting: Orthopedic Surgery

## 2024-03-17 DIAGNOSIS — Z01812 Encounter for preprocedural laboratory examination: Secondary | ICD-10-CM | POA: Insufficient documentation

## 2024-03-17 LAB — BASIC METABOLIC PANEL WITH GFR
Anion gap: 8 (ref 5–15)
BUN: 18 mg/dL (ref 8–23)
CO2: 24 mmol/L (ref 22–32)
Calcium: 9.2 mg/dL (ref 8.9–10.3)
Chloride: 108 mmol/L (ref 98–111)
Creatinine, Ser: 0.93 mg/dL (ref 0.44–1.00)
GFR, Estimated: >60 mL/min (ref >60)
Glucose, Bld: 93 mg/dL (ref 70–99)
Potassium: 4.3 mmol/L (ref 3.5–5.1)
Sodium: 140 mmol/L (ref 135–145)

## 2024-03-17 NOTE — Anesthesia Preprocedure Evaluation (Addendum)
 Anesthesia Evaluation  Patient identified by MRN, date of birth, ID band Patient awake    Reviewed: Allergy & Precautions, NPO status , Patient's Chart, lab work & pertinent test results  History of Anesthesia Complications (+) PONV and history of anesthetic complications  Airway Mallampati: III  TM Distance: >3 FB Neck ROM: Full    Dental  (+) Dental Advisory Given, Edentulous Upper, Edentulous Lower   Pulmonary neg shortness of breath, neg sleep apnea, neg COPD, neg recent URI, former smoker   Pulmonary exam normal breath sounds clear to auscultation       Cardiovascular hypertension (nifedipine ), Pt. on medications (-) angina + CAD  (-) Past MI, (-) Cardiac Stents and (-) CABG (-) dysrhythmias  Rhythm:Regular Rate:Normal  HLD   Neuro/Psych neg Seizures PSYCHIATRIC DISORDERS Anxiety Depression     Neuromuscular disease (chronic low back pain with sciatica, s/p lumbar fusion)    GI/Hepatic Neg liver ROS, hiatal hernia, PUD,GERD  Medicated,,  Endo/Other  diabetes, Type 2Hypothyroidism  Lesion of adrenal gland  Renal/GU negative Renal ROS     Musculoskeletal  (+) Arthritis , Osteoarthritis,  Fibromyalgia -  Abdominal  (+) + obese  Peds  Hematology negative hematology ROS (+)   Anesthesia Other Findings Last Farxiga : Patient unsure but states that she only took what she was told to take  Reproductive/Obstetrics                              Anesthesia Physical Anesthesia Plan  ASA: 3  Anesthesia Plan: MAC   Post-op Pain Management: Tylenol  PO (pre-op)*   Induction: Intravenous  PONV Risk Score and Plan: 3 and Ondansetron , Dexamethasone  and Treatment may vary due to age or medical condition  Airway Management Planned: Natural Airway and Simple Face Mask  Additional Equipment:   Intra-op Plan:   Post-operative Plan:   Informed Consent: I have reviewed the patients History and  Physical, chart, labs and discussed the procedure including the risks, benefits and alternatives for the proposed anesthesia with the patient or authorized representative who has indicated his/her understanding and acceptance.     Dental advisory given  Plan Discussed with: CRNA and Anesthesiologist  Anesthesia Plan Comments: (Discussed with patient risks of MAC including, but not limited to, minor pain or discomfort, hearing people in the room, and possible need for backup general anesthesia. Risks for general anesthesia also discussed including, but not limited to, sore throat, hoarse voice, chipped/damaged teeth, injury to vocal cords, nausea and vomiting, allergic reactions, lung infection, heart attack, stroke, and death. All questions answered. )         Anesthesia Quick Evaluation

## 2024-03-21 ENCOUNTER — Ambulatory Visit (HOSPITAL_BASED_OUTPATIENT_CLINIC_OR_DEPARTMENT_OTHER): Payer: Self-pay | Admitting: Anesthesiology

## 2024-03-21 ENCOUNTER — Ambulatory Visit (HOSPITAL_BASED_OUTPATIENT_CLINIC_OR_DEPARTMENT_OTHER)
Admission: RE | Admit: 2024-03-21 | Discharge: 2024-03-21 | Disposition: A | Discharge status: Home / Self Care | Attending: Orthopedic Surgery | Admitting: Orthopedic Surgery

## 2024-03-21 ENCOUNTER — Encounter (HOSPITAL_BASED_OUTPATIENT_CLINIC_OR_DEPARTMENT_OTHER): Payer: Self-pay | Admitting: Orthopedic Surgery

## 2024-03-21 ENCOUNTER — Ambulatory Visit: Admitting: Family

## 2024-03-21 ENCOUNTER — Encounter (HOSPITAL_BASED_OUTPATIENT_CLINIC_OR_DEPARTMENT_OTHER)
Admission: RE | Disposition: A | Discharge status: Home / Self Care | Payer: Self-pay | Source: Home / Self Care | Attending: Orthopedic Surgery

## 2024-03-21 DIAGNOSIS — K449 Diaphragmatic hernia without obstruction or gangrene: Secondary | ICD-10-CM | POA: Diagnosis not present

## 2024-03-21 DIAGNOSIS — F419 Anxiety disorder, unspecified: Secondary | ICD-10-CM | POA: Diagnosis not present

## 2024-03-21 DIAGNOSIS — Z79899 Other long term (current) drug therapy: Secondary | ICD-10-CM | POA: Insufficient documentation

## 2024-03-21 DIAGNOSIS — I2510 Atherosclerotic heart disease of native coronary artery without angina pectoris: Secondary | ICD-10-CM | POA: Insufficient documentation

## 2024-03-21 DIAGNOSIS — F32A Depression, unspecified: Secondary | ICD-10-CM | POA: Diagnosis not present

## 2024-03-21 DIAGNOSIS — K219 Gastro-esophageal reflux disease without esophagitis: Secondary | ICD-10-CM | POA: Diagnosis not present

## 2024-03-21 DIAGNOSIS — E1169 Type 2 diabetes mellitus with other specified complication: Secondary | ICD-10-CM

## 2024-03-21 DIAGNOSIS — M71341 Other bursal cyst, right hand: Secondary | ICD-10-CM | POA: Insufficient documentation

## 2024-03-21 DIAGNOSIS — R2231 Localized swelling, mass and lump, right upper limb: Secondary | ICD-10-CM | POA: Diagnosis not present

## 2024-03-21 DIAGNOSIS — Z7984 Long term (current) use of oral hypoglycemic drugs: Secondary | ICD-10-CM | POA: Insufficient documentation

## 2024-03-21 DIAGNOSIS — M67441 Ganglion, right hand: Secondary | ICD-10-CM | POA: Diagnosis not present

## 2024-03-21 DIAGNOSIS — E119 Type 2 diabetes mellitus without complications: Secondary | ICD-10-CM | POA: Insufficient documentation

## 2024-03-21 DIAGNOSIS — E039 Hypothyroidism, unspecified: Secondary | ICD-10-CM | POA: Diagnosis not present

## 2024-03-21 DIAGNOSIS — M5440 Lumbago with sciatica, unspecified side: Secondary | ICD-10-CM | POA: Insufficient documentation

## 2024-03-21 DIAGNOSIS — F418 Other specified anxiety disorders: Secondary | ICD-10-CM

## 2024-03-21 DIAGNOSIS — Z981 Arthrodesis status: Secondary | ICD-10-CM | POA: Insufficient documentation

## 2024-03-21 DIAGNOSIS — M797 Fibromyalgia: Secondary | ICD-10-CM | POA: Insufficient documentation

## 2024-03-21 DIAGNOSIS — M1990 Unspecified osteoarthritis, unspecified site: Secondary | ICD-10-CM | POA: Diagnosis not present

## 2024-03-21 DIAGNOSIS — I10 Essential (primary) hypertension: Secondary | ICD-10-CM | POA: Diagnosis not present

## 2024-03-21 DIAGNOSIS — Z87891 Personal history of nicotine dependence: Secondary | ICD-10-CM | POA: Insufficient documentation

## 2024-03-21 DIAGNOSIS — Z8711 Personal history of peptic ulcer disease: Secondary | ICD-10-CM | POA: Diagnosis not present

## 2024-03-21 HISTORY — DX: Anxiety disorder, unspecified: F41.9

## 2024-03-21 HISTORY — PX: EXCISION METACARPAL MASS: SHX6372

## 2024-03-21 LAB — GLUCOSE, CAPILLARY
Glucose-Capillary: 108 mg/dL — ABNORMAL HIGH (ref 70–99)
Glucose-Capillary: 127 mg/dL — ABNORMAL HIGH (ref 70–99)

## 2024-03-21 SURGERY — EXCISION METACARPAL MASS
Anesthesia: Monitor Anesthesia Care | Site: Finger | Laterality: Right

## 2024-03-21 MED ORDER — BUPIVACAINE HCL (PF) 0.25 % IJ SOLN
INTRAMUSCULAR | Status: DC | PRN
  Administered 2024-03-21: 9 mL

## 2024-03-21 MED ORDER — DEXMEDETOMIDINE HCL IN NACL 80 MCG/20ML IV SOLN
INTRAVENOUS | Status: DC | PRN
  Administered 2024-03-21: 4 ug via INTRAVENOUS

## 2024-03-21 MED ORDER — OXYCODONE HCL 5 MG PO TABS: 5.0000 mg | ORAL_TABLET | Freq: Once | ORAL | Status: DC | PRN

## 2024-03-21 MED ORDER — ACETAMINOPHEN 500 MG PO TABS
1000.0000 mg | ORAL_TABLET | Freq: Once | ORAL | Status: AC
  Administered 2024-03-21: 1000 mg via ORAL

## 2024-03-21 MED ORDER — FENTANYL CITRATE (PF) 100 MCG/2ML IJ SOLN
INTRAMUSCULAR | Status: AC
  Filled 2024-03-21: qty 2

## 2024-03-21 MED ORDER — LIDOCAINE 2% (20 MG/ML) 5 ML SYRINGE
INTRAMUSCULAR | Status: DC | PRN
  Administered 2024-03-21: 40 mg via INTRAVENOUS

## 2024-03-21 MED ORDER — ONDANSETRON HCL 4 MG/2ML IJ SOLN
INTRAMUSCULAR | Status: DC | PRN
  Administered 2024-03-21: 4 mg via INTRAVENOUS

## 2024-03-21 MED ORDER — OXYCODONE HCL 5 MG/5ML PO SOLN: 5.0000 mg | Freq: Once | ORAL | Status: DC | PRN

## 2024-03-21 MED ORDER — BUPIVACAINE HCL (PF) 0.25 % IJ SOLN
INTRAMUSCULAR | Status: AC
  Filled 2024-03-21: qty 30

## 2024-03-21 MED ORDER — AMISULPRIDE (ANTIEMETIC) 5 MG/2ML IV SOLN: 10.0000 mg | Freq: Once | INTRAVENOUS | Status: DC | PRN

## 2024-03-21 MED ORDER — DEXAMETHASONE SODIUM PHOSPHATE 10 MG/ML IJ SOLN: INTRAMUSCULAR | Status: DC | PRN

## 2024-03-21 MED ORDER — PHENYLEPHRINE 80 MCG/ML (10ML) SYRINGE FOR IV PUSH (FOR BLOOD PRESSURE SUPPORT)
PREFILLED_SYRINGE | INTRAVENOUS | Status: DC | PRN
  Administered 2024-03-21: 80 ug via INTRAVENOUS

## 2024-03-21 MED ORDER — 0.9 % SODIUM CHLORIDE (POUR BTL) OPTIME
TOPICAL | Status: DC | PRN
  Administered 2024-03-21: 100 mL

## 2024-03-21 MED ORDER — ACETAMINOPHEN 500 MG PO TABS
ORAL_TABLET | ORAL | Status: AC
  Filled 2024-03-21: qty 2

## 2024-03-21 MED ORDER — LACTATED RINGERS IV SOLN: INTRAVENOUS | Status: DC

## 2024-03-21 MED ORDER — FENTANYL CITRATE (PF) 250 MCG/5ML IJ SOLN
INTRAMUSCULAR | Status: DC | PRN
  Administered 2024-03-21: 25 ug via INTRAVENOUS
  Administered 2024-03-21: 50 ug via INTRAVENOUS
  Administered 2024-03-21: 25 ug via INTRAVENOUS

## 2024-03-21 MED ORDER — FENTANYL CITRATE (PF) 100 MCG/2ML IJ SOLN: 25.0000 ug | INTRAMUSCULAR | Status: DC | PRN

## 2024-03-21 MED ORDER — PROPOFOL 10 MG/ML IV BOLUS
INTRAVENOUS | Status: DC | PRN
  Administered 2024-03-21: 50 mg via INTRAVENOUS
  Administered 2024-03-21: 80 mg via INTRAVENOUS

## 2024-03-21 MED ORDER — LACTATED RINGERS IV SOLN: INTRAVENOUS | Status: DC | PRN

## 2024-03-21 MED ORDER — PROPOFOL 500 MG/50ML IV EMUL
INTRAVENOUS | Status: DC | PRN
  Administered 2024-03-21: 150 ug/kg/min via INTRAVENOUS

## 2024-03-21 MED ORDER — DEXAMETHASONE SODIUM PHOSPHATE 4 MG/ML IJ SOLN
INTRAMUSCULAR | Status: DC | PRN
  Administered 2024-03-21: 4 mg via INTRAVENOUS

## 2024-03-21 SURGICAL SUPPLY — 42 items
BANDAGE GAUZE 1X75IN STRL (MISCELLANEOUS) IMPLANT
BENZOIN TINCTURE PRP APPL 2/3 (GAUZE/BANDAGES/DRESSINGS) IMPLANT
BLADE MINI RND TIP GREEN BEAV (BLADE) IMPLANT
BLADE SURG 15 STRL LF DISP TIS (BLADE) ×2 IMPLANT
BNDG COHESIVE 1X5 TAN STRL LF (GAUZE/BANDAGES/DRESSINGS) IMPLANT
BNDG COHESIVE 2X5 TAN ST LF (GAUZE/BANDAGES/DRESSINGS) IMPLANT
BNDG COMPR ESMARK 4X3 LF (GAUZE/BANDAGES/DRESSINGS) IMPLANT
BNDG ELASTIC 2INX 5YD STR LF (GAUZE/BANDAGES/DRESSINGS) IMPLANT
BNDG ELASTIC 3INX 5YD STR LF (GAUZE/BANDAGES/DRESSINGS) IMPLANT
BNDG GAUZE DERMACEA FLUFF 4 (GAUZE/BANDAGES/DRESSINGS) IMPLANT
BNDG PLASTER X FAST 3X3 WHT LF (CAST SUPPLIES) IMPLANT
CHLORAPREP W/TINT 26 (MISCELLANEOUS) ×1 IMPLANT
CORD BIPOLAR FORCEPS 12FT (ELECTRODE) ×1 IMPLANT
COVER BACK TABLE 60X90IN (DRAPES) ×1 IMPLANT
COVER MAYO STAND STRL (DRAPES) ×1 IMPLANT
CUFF TOURN SGL QUICK 18X4 (TOURNIQUET CUFF) ×1 IMPLANT
DRAPE EXTREMITY T 121X128X90 (DISPOSABLE) ×1 IMPLANT
DRAPE SURG 17X23 STRL (DRAPES) ×1 IMPLANT
GAUZE SPONGE 4X4 12PLY STRL (GAUZE/BANDAGES/DRESSINGS) ×1 IMPLANT
GAUZE STRETCH 2X75IN STRL (MISCELLANEOUS) IMPLANT
GAUZE XEROFORM 1X8 LF (GAUZE/BANDAGES/DRESSINGS) ×1 IMPLANT
GLOVE BIO SURGEON STRL SZ7.5 (GLOVE) ×1 IMPLANT
GLOVE BIOGEL PI IND STRL 6.5 (GLOVE) IMPLANT
GLOVE BIOGEL PI IND STRL 8 (GLOVE) ×1 IMPLANT
GLOVE SURG SS PI 6.5 STRL IVOR (GLOVE) IMPLANT
GOWN STRL REUS W/ TWL LRG LVL3 (GOWN DISPOSABLE) ×1 IMPLANT
GOWN STRL REUS W/TWL XL LVL3 (GOWN DISPOSABLE) ×1 IMPLANT
NDL HYPO 25X1 1.5 SAFETY (NEEDLE) ×1 IMPLANT
NEEDLE HYPO 25X1 1.5 SAFETY (NEEDLE) ×1 IMPLANT
NS IRRIG 1000ML POUR BTL (IV SOLUTION) ×1 IMPLANT
PACK BASIN DAY SURGERY FS (CUSTOM PROCEDURE TRAY) ×1 IMPLANT
PAD CAST 3X4 CTTN HI CHSV (CAST SUPPLIES) IMPLANT
PAD CAST 4YDX4 CTTN HI CHSV (CAST SUPPLIES) IMPLANT
PADDING CAST ABS COTTON 4X4 ST (CAST SUPPLIES) ×1 IMPLANT
STOCKINETTE 4X48 STRL (DRAPES) ×1 IMPLANT
STRIP CLOSURE SKIN 1/2X4 (GAUZE/BANDAGES/DRESSINGS) IMPLANT
SUT ETHILON 3 0 PS 1 (SUTURE) IMPLANT
SUT ETHILON 4 0 PS 2 18 (SUTURE) ×1 IMPLANT
SYR BULB EAR ULCER 3OZ GRN STR (SYRINGE) ×1 IMPLANT
SYR CONTROL 10ML LL (SYRINGE) ×1 IMPLANT
TOWEL GREEN STERILE FF (TOWEL DISPOSABLE) ×2 IMPLANT
UNDERPAD 30X36 HEAVY ABSORB (UNDERPADS AND DIAPERS) ×1 IMPLANT

## 2024-03-21 NOTE — Transfer of Care (Signed)
 Immediate Anesthesia Transfer of Care Note  Patient: LEXA CORONADO  Procedure(s) Performed: EXCISION OF RIGHT LONG FINGER AND RIGHT RING FINGER MASSES (Right: Finger)  Patient Location: PACU  Anesthesia Type:General  Level of Consciousness: drowsy and patient cooperative  Airway & Oxygen Therapy: Patient Spontanous Breathing and Patient connected to face mask oxygen  Post-op Assessment: Report given to RN and Post -op Vital signs reviewed and stable  Post vital signs: Reviewed and stable  Last Vitals:  Vitals Value Taken Time  BP 101/58 03/21/24 14:10  Temp 36.3 C 03/21/24 14:10  Pulse 59 03/21/24 14:10  Resp 0 03/21/24 14:10  SpO2 96 % 03/21/24 14:10    Last Pain:  Vitals:   03/21/24 1120  TempSrc: Temporal  PainSc: 0-No pain      Patients Stated Pain Goal: 3 (03/21/24 1120)  Complications: No notable events documented.

## 2024-03-21 NOTE — Anesthesia Postprocedure Evaluation (Signed)
 Anesthesia Post Note  Patient: Cynthia Mays  Procedure(s) Performed: EXCISION OF RIGHT LONG FINGER AND RIGHT RING FINGER MASSES (Right: Finger)     Patient location during evaluation: PACU Anesthesia Type: General Level of consciousness: awake Pain management: pain level controlled Vital Signs Assessment: post-procedure vital signs reviewed and stable Respiratory status: spontaneous breathing, nonlabored ventilation and respiratory function stable Cardiovascular status: blood pressure returned to baseline and stable Postop Assessment: no apparent nausea or vomiting Anesthetic complications: no   No notable events documented.  Last Vitals:  Vitals:   03/21/24 1444 03/21/24 1445  BP:    Pulse: (!) 59 (!) 54  Resp: 19 12  Temp:  (!) 36.2 C  SpO2: 95% 94%    Last Pain:  Vitals:   03/21/24 1430  TempSrc:   PainSc: 0-No pain                 Cynthia Mays

## 2024-03-21 NOTE — Anesthesia Procedure Notes (Signed)
 Procedure Name: LMA Insertion Date/Time: 03/21/2024 1:31 PM  Performed by: Rhodia Debby MATSU, CRNAPre-anesthesia Checklist: Patient identified, Emergency Drugs available, Suction available, Patient being monitored and Timeout performed Patient Re-evaluated:Patient Re-evaluated prior to induction Oxygen Delivery Method: Circle system utilized Preoxygenation: Pre-oxygenation with 100% oxygen Induction Type: IV induction Ventilation: Mask ventilation without difficulty LMA: LMA inserted LMA Size: 4.0 Number of attempts: 1 Placement Confirmation: positive ETCO2 and breath sounds checked- equal and bilateral ETT to lip (cm): yes. Tube secured with: Tape Dental Injury: Teeth and Oropharynx as per pre-operative assessment  Comments: Smooth gums unchanged vss brief atraumatic

## 2024-03-21 NOTE — Op Note (Addendum)
 NAME: Cynthia Mays MEDICAL RECORD NO: [id number] DATE OF BIRTH: [birth year] FACILITY: [facility] LOCATION: [facility] PHYSICIAN: Breyonna Nault R. 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  Right long finger excision annular ligament cyst 2.  Right ring finger excision annular ligament cyst   SURGEON:  Franky Curia, M.D.   ASSISTANT: none   ANESTHESIA:  General   INTRAVENOUS FLUIDS:  Per anesthesia flow sheet.   ESTIMATED BLOOD LOSS:  Minimal.   COMPLICATIONS:  None.   SPECIMENS: Right long and ring finger annular ligament cyst to pathology   TOURNIQUET TIME:    Total Tourniquet Time Documented: Upper Arm (Right) - 20 minutes Total: Upper Arm (Right) - 20 minutes    DISPOSITION:  Stable to PACU.   INDICATIONS: 83 year old female with right long and ring finger annular ligament cysts.  These are bothersome to her.  She wishes to have them removed.  Risks, benefits and alternatives of surgery were discussed including the risks of blood loss, infection, damage to nerves, vessels, tendons, ligaments, bone for surgery, need for additional surgery, complications with wound healing, continued pain, stiffness, , recurrence.  She voiced understanding of these risks and elected to proceed.  OPERATIVE COURSE:  After being identified preoperatively by myself,  the patient and I agreed on the procedure and site of the procedure.  The surgical site was marked.  Surgical consent had been signed. Preoperative IV antibiotic prophylaxis was given. She was transferred to the operating room and placed on the operating table in supine position with the Right upper extremity on an arm board.  General anesthesia was induced by the anesthesiologist.  Right upper extremity was prepped and draped in normal  sterile orthopedic fashion.  A surgical pause was performed between the surgeons, anesthesia, and operating room staff and all were in agreement as to the patient, procedure, and site of procedure.  Tourniquet at the proximal aspect of the extremity was inflated to 250 mmHg after exsanguination of the arm with an Esmarch bandage.  A Bruner type incision was made over the volar aspect of the long finger over the palpable cyst.  This is carried in subcutaneous tissues by spreading technique.  The cyst was identified coming from the volar aspect of the A2 pulley.  It was approximately 3 mm in diameter.  It was cleared of soft tissue attachment.  It was removed along with a window of the A2 pulley to try to prevent recurrence.  Flexor tendons were intact.  The mass was sent to pathology for examination.  The wound was irrigated with sterile saline and closed with 4-0 nylon in a horizontal mattress fashion.  A second incision was made at the ulnar side of the ring finger proximal phalanx over the palpable mass.  This was carried in subcutaneous tissues by spreading technique.  The mass was identified.  It was coming from the base of the flexor sheath.  It was freed up from surrounding soft tissues.  A small window of the flexor sheath around the base of the mass was opened and removed with the cyst.  The cyst was sent to pathology for examination.  The underlying flexor tendons were intact.  The wound was irrigated with sterile saline and closed with 4-0 nylon in a horizontal mattress fashion.  Digital blocks  performed with quarter percent plain Marcaine  to aid in postoperative analgesia.  The wounds were both dressed with sterile Xeroform and 4 x 4's and wrapped with a Coban dressing lightly.  The tourniquet was deflated at 20 minutes.  Fingertips were pink with brisk capillary refill after deflation of tourniquet.  The operative  drapes were broken down.  The patient was awoken from anesthesia safely.  She was  transferred back to the stretcher and taken to PACU in stable condition.  I will see her back in the office in 1 week for postoperative followup.  She states she has hydrocodone  at home and does not need a prescription for postoperative pain.   Cynthia Brodrick, MD Electronically signed, 03/21/24

## 2024-03-21 NOTE — Discharge Instructions (Addendum)
 [facility] Instructions Hand Surgery  Wound Care: Keep your hand elevated above the level of your heart.  Do not allow it to dangle by your side.  Keep the dressing dry and do not remove it unless your doctor advises you to do so.  He will usually change it at the time of your post-op visit.  Moving your fingers is advised to stimulate circulation but will depend on the site of your surgery.  If you have a splint applied, your doctor will advise you regarding movement.  Activity: Do not drive or operate machinery today.  Rest today and then you may return to your normal activity and work as indicated by your physician.  Diet:  Drink liquids today or eat a light diet.  You may resume a regular diet tomorrow.    General expectations: Pain for two to three days. Fingers may become slightly swollen.  Call your doctor if any of the following occur: Severe pain not relieved by pain medication. Elevated temperature. Dressing soaked with blood. Inability to move fingers. White or bluish color to fingers.     Post Anesthesia Home Care Instructions  Activity: Get plenty of rest for the remainder of the day. A responsible individual must stay with you for 24 hours following the procedure.  For the next 24 hours, DO NOT: -Drive a car -Advertising copywriter -Drink alcoholic beverages -Take any medication unless instructed by your physician -Make any legal decisions or sign important papers.  Meals: Start with liquid foods such as gelatin or soup. Progress to regular foods as tolerated. Avoid greasy, spicy, heavy foods. If nausea and/or vomiting occur, drink only clear liquids until the nausea and/or vomiting subsides. Call your physician if vomiting continues.  Special Instructions/Symptoms: Your throat may feel dry or sore from the anesthesia or the breathing tube placed in your throat during surgery. If this causes discomfort, gargle with warm salt water . The discomfort should disappear  within 24 hours.  If you had a scopolamine  patch placed behind your ear for the management of post- operative nausea and/or vomiting:  1. The medication in the patch is effective for 72 hours, after which it should be removed.  Wrap patch in a tissue and discard in the trash. Wash hands thoroughly with soap and water . 2. You may remove the patch earlier than 72 hours if you experience unpleasant side effects which may include dry mouth, dizziness or visual disturbances. 3. Avoid touching the patch. Wash your hands with soap and water  after contact with the patch.     Last received tylenol  at 1125am

## 2024-03-21 NOTE — H&P (Signed)
 Cynthia Mays is an 83 y.o. female.   Chief Complaint: finger cysts HPI: 83 yo female with cysts of right long and ring fingers.  These are bothersome to her.  She wishes to have them removed.  Allergies:  Allergies  Allergen Reactions   Codeine Other (See Comments)    Headache, gi upset   Penicillins Rash    Has patient had a PCN reaction causing immediate rash, facial/tongue/throat swelling, SOB or lightheadedness with hypotension: Yes Has patient had a PCN reaction causing severe rash involving mucus membranes or skin necrosis: No Has patient had a PCN reaction that required hospitalization No Has patient had a PCN reaction occurring within the last 10 years: No If all of the above answers are NO, then may proceed with Cephalosporin use.    Pravastatin     Leg cramps    Past Medical History:  Diagnosis Date   Anxiety    Complication of anesthesia    Depression    Diabetes mellitus    Fibromyalgia    GERD (gastroesophageal reflux disease)    Hiatal hernia    Hyperlipidemia    Hypertension    Hypothyroidism    Hypothyroidism 07/23/2016   PONV (postoperative nausea and vomiting)     Past Surgical History:  Procedure Laterality Date   APPENDECTOMY  2002   arthroscopy  right 2002, left 2007   bilateral knees   BACK SURGERY  10/2007, 09/2008   x2   BIOPSY  10/02/2016   Procedure: BIOPSY;  Surgeon: Claudis RAYMOND Rivet, MD;  [facility]: [facility];  Service: Endoscopy;;  gastric   BIOPSY  05/04/2020   Procedure: BIOPSY;  Surgeon: Eartha Angelia Sieving, MD;  Location: [facility];  Service: Gastroenterology;;   CARPAL TUNNEL RELEASE  1986   bilateral   CESAREAN SECTION  1969, 1967   x2   COLONOSCOPY WITH PROPOFOL  N/A 05/04/2020   Eartha:The examined portion of the ileum was normal(focally active, nonspecific ileitis, neg for features of chronicity or granulomas)- A single (solitary) ulcer in the cecum.(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 tortuous colon, non bleeding internal hemorrhoids   ESOPHAGOGASTRODUODENOSCOPY N/A 10/02/2016   Procedure: ESOPHAGOGASTRODUODENOSCOPY (EGD);  Surgeon: Claudis RAYMOND Rivet, MD;  Location: [facility];  Service: Endoscopy;  Laterality: N/A;  11:15 - moved to 2/1 @ 3:00   ESOPHAGOGASTRODUODENOSCOPY (EGD) WITH PROPOFOL  N/A 05/04/2020   Castaneda: normal esophagus, 4cm hh with four cameron ulcers, multiple gastric polyps-fundic gland polyps, erythematous mucosa in pylorus, gastric antral mucosa and oxyntic mucosa with mild chronic gastritis and reactive changes, neg h pylori, normal examined duodenum   KNEE SURGERY     both    Family History: Family History  Problem Relation Age of Onset   Anesthesia problems Mother    CAD Mother 3   Cancer Mother        Pancreatic cancer   Hypertension Father    Suicidality Father        gunshot   Deafness Sister    Heart disease Brother    Suicidality Son    Cancer Sister        female    Suicidality Brother    Hypotension Neg Hx    Pseudochol deficiency Neg Hx    Malignant hyperthermia Neg Hx     Social History:   reports that she quit smoking about 53 years ago. Her smoking use included cigarettes. She has never used  smokeless tobacco. She reports that she does not currently use alcohol. She reports that she does not use drugs.  Medications: Medications Prior to Admission  Medication Sig Dispense Refill   CALCIUM  PO Take by mouth 1 day or 1 dose.     cetirizine  (ZYRTEC  ALLERGY) 10 MG tablet Take 1 tablet (10 mg total) by mouth daily. 90 tablet 1   cholecalciferol (VITAMIN D3) 25 MCG (1000 UNIT) tablet Take 1,000 Units by mouth daily.      diazepam  (VALIUM ) 5 MG tablet Take 1 tablet (5 mg total) by mouth every 6 (six) hours as needed for anxiety or muscle spasms. 30 tablet 2   diclofenac  (VOLTAREN ) 75 MG EC tablet Take 1 tablet by mouth twice  daily 60 tablet 0   FARXIGA  5 MG TABS tablet Take 1 tablet (5 mg total) by mouth daily before breakfast. 90 tablet 0   fluticasone (FLONASE) 50 MCG/ACT nasal spray Place 2 sprays into the nose daily.     HYDROcodone -acetaminophen  (NORCO/VICODIN) 5-325 MG tablet Take 1 tablet by mouth every 6 (six) hours as needed for moderate pain (pain score 4-6). 90 tablet 0   levothyroxine  (SYNTHROID ) 125 MCG tablet Take 1 tablet by mouth once daily 90 tablet 0   meclizine  (ANTIVERT ) 12.5 MG tablet Take 1 tablet (12.5 mg total) by mouth 3 (three) times daily as needed for dizziness. 90 tablet 2   mesalamine  (LIALDA ) 1.2 g EC tablet TAKE 2 TABLETS BY MOUTH ONCE DAILY WITH BREAKFAST 180 tablet 0   Multiple Vitamins-Minerals (MULTIVITAMINS THER. W/MINERALS) TABS Take 1 tablet by mouth daily.     NIFEdipine  (PROCARDIA -XL/NIFEDICAL-XL) 30 MG 24 hr tablet Take 1 tablet (30 mg total) by mouth daily. 90 tablet 3   pantoprazole  (PROTONIX ) 40 MG tablet Take 1 tablet (40 mg total) by mouth 2 (two) times daily. 180 tablet 0   Probiotic Product (PHILLIPS COLON HEALTH) CAPS Take 1 capsule by mouth daily.      rosuvastatin  (CRESTOR ) 5 MG tablet Take 1 tablet (5 mg total) by mouth daily. 90 tablet 3   sertraline  (ZOLOFT ) 50 MG tablet TAKE 1 & 1/2 (ONE & ONE-HALF) TABLETS BY MOUTH ONCE DAILY 135 tablet 0   clobetasol  cream (TEMOVATE ) 0.05 % APPLY  CREAM TOPICALLY TO AFFECTED AREA ONCE DAILY AS NEEDED FOR RASH 30 g 5   gabapentin  (NEURONTIN ) 300 MG capsule Take 1 capsule (300 mg total) by mouth 3 (three) times daily. 90 capsule 0   HYDROcodone -acetaminophen  (NORCO/VICODIN) 5-325 MG tablet Take 1 tablet by mouth every 6 (six) hours as needed for moderate pain (pain score 4-6). 90 tablet 0   HYDROcodone -acetaminophen  (NORCO/VICODIN) 5-325 MG tablet Take 1 tablet by mouth every 6 (six) hours as needed for moderate pain (pain score 4-6). 90 tablet 0   ondansetron  (ZOFRAN  ODT) 8 MG disintegrating tablet Take 1 tablet (8 mg total) by  mouth every 8 (eight) hours as needed for nausea or vomiting. 20 tablet 0   traZODone  (DESYREL ) 50 MG tablet Take 1-2 tablets (50-100 mg total) by mouth at bedtime. 180 tablet 2    Results for orders placed or performed during the hospital encounter of 03/21/24 (from the past 48 hours)  Glucose, capillary     Status: Abnormal   Collection Time: 03/21/24 11:23 AM  Result Value Ref Range   Glucose-Capillary 108 (H) 70 - 99 mg/dL    Comment: Glucose reference range applies only to samples taken after fasting for at least 8 hours.    No results  found.    Blood pressure 133/69, pulse 63, temperature (!) 97.2 F (36.2 C), temperature source Temporal, resp. rate 20, height 4' 11 (1.499 m), weight 79.5 kg, SpO2 99%.  General appearance: alert, cooperative, and appears stated age Head: Normocephalic, without obvious abnormality, atraumatic Neck: supple, symmetrical, trachea midline Extremities: Intact sensation and capillary refill all digits.  +epl/fpl/io.  No wounds. Palpable cysts of right long and ring fingers. Skin: Skin color, texture, turgor normal. No rashes or lesions Neurologic: Grossly normal Incision/Wound: none  Assessment/Plan Right long and ring finger masses.  Plan surgical excision of masses.  Risks, benefits and alternatives of surgery were discussed including risks of blood loss, infection, damage to nerves/vessels/tendons/ligament/bone, failure of surgery, need for additional surgery, complication with wound healing, stiffness, recurrence.  She voiced understanding of these risks and elected to proceed.    Trinka Keshishyan 03/21/2024, 1:12 PM

## 2024-03-22 ENCOUNTER — Encounter (HOSPITAL_BASED_OUTPATIENT_CLINIC_OR_DEPARTMENT_OTHER): Payer: Self-pay | Admitting: Orthopedic Surgery

## 2024-03-22 LAB — SURGICAL PATHOLOGY

## 2024-03-28 DIAGNOSIS — M67441 Ganglion, right hand: Secondary | ICD-10-CM | POA: Diagnosis not present

## 2024-04-04 DIAGNOSIS — R2231 Localized swelling, mass and lump, right upper limb: Secondary | ICD-10-CM | POA: Diagnosis not present

## 2024-04-04 DIAGNOSIS — M67441 Ganglion, right hand: Secondary | ICD-10-CM | POA: Diagnosis not present

## 2024-04-04 DIAGNOSIS — M79641 Pain in right hand: Secondary | ICD-10-CM | POA: Diagnosis not present

## 2024-04-22 ENCOUNTER — Encounter: Payer: Self-pay | Admitting: Radiology

## 2024-04-25 ENCOUNTER — Ambulatory Visit (INDEPENDENT_AMBULATORY_CARE_PROVIDER_SITE_OTHER): Admitting: Family

## 2024-04-25 ENCOUNTER — Encounter: Payer: Self-pay | Admitting: Family

## 2024-04-25 VITALS — BP 138/72 | HR 78 | Temp 97.4°F | Ht 59.0 in | Wt 177.4 lb

## 2024-04-25 DIAGNOSIS — G4700 Insomnia, unspecified: Secondary | ICD-10-CM | POA: Diagnosis not present

## 2024-04-25 DIAGNOSIS — E039 Hypothyroidism, unspecified: Secondary | ICD-10-CM

## 2024-04-25 DIAGNOSIS — K219 Gastro-esophageal reflux disease without esophagitis: Secondary | ICD-10-CM | POA: Diagnosis not present

## 2024-04-25 DIAGNOSIS — Z79899 Other long term (current) drug therapy: Secondary | ICD-10-CM | POA: Diagnosis not present

## 2024-04-25 DIAGNOSIS — M5441 Lumbago with sciatica, right side: Secondary | ICD-10-CM

## 2024-04-25 DIAGNOSIS — E1169 Type 2 diabetes mellitus with other specified complication: Secondary | ICD-10-CM

## 2024-04-25 DIAGNOSIS — Z0000 Encounter for general adult medical examination without abnormal findings: Secondary | ICD-10-CM | POA: Diagnosis not present

## 2024-04-25 DIAGNOSIS — I2510 Atherosclerotic heart disease of native coronary artery without angina pectoris: Secondary | ICD-10-CM

## 2024-04-25 DIAGNOSIS — F411 Generalized anxiety disorder: Secondary | ICD-10-CM | POA: Diagnosis not present

## 2024-04-25 DIAGNOSIS — Z0001 Encounter for general adult medical examination with abnormal findings: Secondary | ICD-10-CM

## 2024-04-25 DIAGNOSIS — I10 Essential (primary) hypertension: Secondary | ICD-10-CM

## 2024-04-25 DIAGNOSIS — G8929 Other chronic pain: Secondary | ICD-10-CM

## 2024-04-25 DIAGNOSIS — Z981 Arthrodesis status: Secondary | ICD-10-CM | POA: Diagnosis not present

## 2024-04-25 LAB — BAYER DCA HB A1C WAIVED: HB A1C (BAYER DCA - WAIVED): 6.4 % — ABNORMAL HIGH (ref 4.8–5.6)

## 2024-04-25 LAB — CBC WITH DIFFERENTIAL/PLATELET

## 2024-04-25 LAB — LIPID PANEL

## 2024-04-25 MED ORDER — HYDROCODONE-ACETAMINOPHEN 5-325 MG PO TABS: 1.0000 | ORAL_TABLET | Freq: Four times a day (QID) | ORAL | 0 refills | Status: DC | PRN

## 2024-04-25 MED ORDER — SERTRALINE HCL 50 MG PO TABS: ORAL_TABLET | ORAL | 0 refills | Status: DC

## 2024-04-25 MED ORDER — ASPIRIN 81 MG PO TBEC: 81.0000 mg | DELAYED_RELEASE_TABLET | Freq: Every day | ORAL | 1 refills | Status: AC

## 2024-04-25 MED ORDER — PANTOPRAZOLE SODIUM 40 MG PO TBEC: 40.0000 mg | DELAYED_RELEASE_TABLET | Freq: Two times a day (BID) | ORAL | 0 refills | Status: DC

## 2024-04-25 MED ORDER — DIAZEPAM 5 MG PO TABS: 5.0000 mg | ORAL_TABLET | Freq: Four times a day (QID) | ORAL | 2 refills | Status: DC | PRN

## 2024-04-25 MED ORDER — GABAPENTIN 300 MG PO CAPS: 300.0000 mg | ORAL_CAPSULE | Freq: Three times a day (TID) | ORAL | 0 refills | Status: DC

## 2024-04-25 MED ORDER — FAMOTIDINE 40 MG PO TABS: 40.0000 mg | ORAL_TABLET | Freq: Every day | ORAL | 1 refills | Status: AC
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
## 2024-04-25 NOTE — Patient Instructions (Signed)
 GERD in Adults: What to Know  Gastroesophageal reflux (GER) is when acid from your stomach flows up into your esophagus. Your esophagus is the part of your body that moves food from your mouth to your stomach. Normally, food goes down and stays in your stomach to be digested. But with GER, food and stomach acid may go back up. You may have a disease called gastroesophageal reflux disease (GERD) if the reflux: Happens often. Causes very bad symptoms. Makes your esophagus sore and swollen. Over time, GERD can make small holes called ulcers in the lining of your esophagus. What are the causes? GERD is caused by a problem with the muscle between your esophagus and stomach. This muscle is called the lower esophageal sphincter (LES). When it's weak or not normal, it doesn't close like it should. This means food and stomach acid can go back up into your esophagus. The muscle can be weak if: You smoke or use products with tobacco in them. You're pregnant. You have a type of hernia called a hiatal hernia. You eat certain foods and drinks. These include: Alcohol. Coffee. Chocolate. Onions. Peppermint. What increases the risk? Being overweight. Having a disease that affects your connective tissue. Taking NSAIDs, such as ibuprofen. What are the signs or symptoms? Heartburn. Trouble swallowing. Pain when you swallow. The feeling of having a lump in your throat. A bitter taste in your mouth. Bad breath. Having an upset or bloated stomach. Burping. Chest pain. Other conditions can also cause chest pain. Make sure you see your health care provider if you have chest pain. Wheezing. This is when you make high-pitched whistling sounds when you breathe, most often when you breathe out. A long-term cough or a cough at night. How is this diagnosed? GERD may be diagnosed based on your medical history and a physical exam. You may also have tests. These may include: An endoscopy. This test looks at your  stomach and esophagus with a small camera. A barium swallow test. This shows the shape and size of your esophagus and how well it's working. Tests of your esophagus to check for: Acid levels. Pressure. How is this treated? Treatment may depend on how bad your symptoms are. It may include: Changes to your diet and daily life. Medicines. Surgery. Follow these instructions at home: Eating and drinking Follow an eating plan as told by your provider. You may need to avoid certain foods and drinks. These may include: Coffee and tea, with or without caffeine. Alcohol. Energy drinks and sports drinks. Fizzy drinks or sodas. Chocolate and cocoa. Peppermint and mint flavorings. Garlic and onions. Horseradish. Spicy and acidic foods. These include: Peppers. Chili powder and curry powder. Vinegar. Hot sauces and BBQ sauce. Citrus fruits and juices. These include: Oranges. Lemons. Limes. Tomato-based foods. These include: Red sauce and pizza with red sauce. Chili. Salsa. Fried and fatty foods. These include: Donuts. [place] fries. Potato chips. High-fat dressings. High-fat meats. These include: Hot dogs and sausage. Rib eye steak. Ham and bacon. High-fat dairy items. These include: Whole milk. Butter. Cream cheese. Eat small meals often. Avoid eating big meals. Avoid drinking lots of liquid with your meals. Try not to eat meals during the 2-3 hours before bedtime. Try not to lie down right after you eat. Do not exercise right after you eat. Lifestyle  If you're overweight, lose an amount of weight that's healthy for you. Ask your provider about a safe weight loss goal. Do not smoke, vape, or use nicotine or tobacco. Wear  loose clothes. Do not wear things that are tight around your waist. When you sleep, try: Raising the head of your bed about 6 inches (15 cm). You can use a wedge to do this. Lying down on your left side. Try to lower your stress. If you need help doing  this, ask your provider. General instructions Take your medicines only as told. Do not take aspirin or ibuprofen unless you're told to. Watch for any changes in your symptoms. Do not bend over if it makes your symptoms worse. Contact a health care provider if: You have new symptoms. You have trouble: Drinking. Swallowing. Eating. It hurts to swallow. You have wheezing. You have a cough that won't go away. Your voice is hoarse. Your symptoms don't get better with treatment. Get help right away if: You have pain all of a sudden in your: Arm. Neck. Jaw. Teeth. Back. You feel sweaty, dizzy, or light-headed all of a sudden. You faint. You have chest pain or shortness of breath. You vomit and the vomit is: Green, yellow, or black. Looks like blood or coffee grounds. Your poop is red, bloody, or black. These symptoms may be an emergency. Call 911 right away. Do not wait to see if the symptoms will go away. Do not drive yourself to the hospital. This information is not intended to replace advice given to you by your health care provider. Make sure you discuss any questions you have with your health care provider. Document Revised: 06/30/2023 Document Reviewed: 01/14/2023 Elsevier Patient Education  2024 [employer].

## 2024-04-26 ENCOUNTER — Ambulatory Visit: Payer: Self-pay | Admitting: Family

## 2024-04-26 LAB — LIPID PANEL
Cholesterol, Total: 164 mg/dL (ref 100–199)
HDL: 44 mg/dL (ref >39)
LDL CALC COMMENT:: 3.7 ratio (ref 0.0–4.4)
LDL Chol Calc (NIH): 101 mg/dL — AB (ref 0–99)
Triglycerides: 105 mg/dL (ref 0–149)
VLDL Cholesterol Cal: 19 mg/dL (ref 5–40)

## 2024-04-26 LAB — CMP14+EGFR
ALT: 12 IU/L (ref 0–32)
AST: 21 IU/L (ref 0–40)
Albumin: 4.2 g/dL (ref 3.7–4.7)
Alkaline Phosphatase: 110 IU/L (ref 44–121)
BUN/Creatinine Ratio: 15 (ref 12–28)
BUN: 15 mg/dL (ref 8–27)
Bilirubin Total: 0.4 mg/dL (ref 0.0–1.2)
CO2: 20 mmol/L (ref 20–29)
Calcium: 9 mg/dL (ref 8.7–10.3)
Chloride: 105 mmol/L (ref 96–106)
Creatinine, Ser: 0.98 mg/dL (ref 0.57–1.00)
Globulin, Total: 3.2 g/dL (ref 1.5–4.5)
Glucose: 126 mg/dL — ABNORMAL HIGH (ref 70–99)
Potassium: 4.1 mmol/L (ref 3.5–5.2)
Sodium: 143 mmol/L (ref 134–144)
Total Protein: 7.4 g/dL (ref 6.0–8.5)
eGFR: 58 mL/min/1.73 — ABNORMAL LOW (ref >59)

## 2024-04-26 LAB — CBC WITH DIFFERENTIAL/PLATELET
Basos: 1
EOS (ABSOLUTE): 0 x10E3/uL (ref 0.0–0.2)
Eos: 0
Hematocrit: 43.2 (ref 34.0–46.6)
Hemoglobin: 14.2 g/dL (ref 11.1–15.9)
Immature Granulocytes: 0
Immature Granulocytes: 0 x10E3/uL (ref 0.0–0.1)
Lymphs: 24
MCH: 30.3 pg (ref 26.6–33.0)
MCHC: 32.9 g/dL (ref 31.5–35.7)
MCV: 92 fL (ref 79–97)
Monocytes Absolute: 0 x10E3/uL (ref 0.0–0.4)
Monocytes Absolute: 0.4 x10E3/uL (ref 0.1–0.9)
Monocytes: 7
Neutrophils Absolute: 1.3 x10E3/uL (ref 0.7–3.1)
Neutrophils Absolute: 3.7 x10E3/uL (ref 1.4–7.0)
Neutrophils: 68
Platelets: 214 x10E3/uL (ref 150–450)
RBC: 4.68 x10E6/uL (ref 3.77–5.28)
RDW: 13.5 (ref 11.7–15.4)
WBC: 5.4 x10E3/uL (ref 3.4–10.8)

## 2024-04-26 LAB — TSH: TSH: 2.04 u[IU]/mL (ref 0.450–4.500)

## 2024-05-03 ENCOUNTER — Ambulatory Visit: Payer: Self-pay

## 2024-05-03 NOTE — Telephone Encounter (Signed)
 Scheduled for next day, no same day OV available and pt preferred to be seen in PCP office.  FYI Only or Action Required?: FYI only for provider.  Patient was last seen in primary care on 04/25/2024 by Lavell Bari LABOR, FNP.  Called Nurse Triage reporting Multiple Complaints.  Symptoms began several days ago.  Interventions attempted: Rest, hydration, or home remedies.  Symptoms are: unchanged.  Triage Disposition: See Today or Tomorrow in Office (overriding See HCP Within 4 Hours (Or PCP Triage))  Patient/caregiver understands and will follow disposition?:  Reason for Disposition  [1] MODERATE weakness (e.g., interferes with work, school, normal activities) AND [2] cause unknown  (Exceptions: Weakness from acute minor illness or poor fluid intake; weakness is chronic and not worse.)  Answer Assessment - Initial Assessment Questions DESCRIPTION: Describe how you are feeling.     Fatigued  ONSET: When did these symptoms begin? (e.g., hours, days, weeks, months)     4 days  CAUSE: What do you think is causing the weakness or fatigue? (e.g., not drinking enough fluids, medical problem, trouble sleeping)     Unknown  OTHER SYMPTOMS: Do you have any other symptoms? (e.g., chest pain, fever, cough, SOB, vomiting, diarrhea, bleeding, other areas of pain)     Mildly elevated BP (140s systolic, resolved), epistaxis 4 days ago  Protocols used: Weakness (Generalized) and Fatigue-A-AH Copied from CRM #[id number]. Topic: Clinical - Red Word Triage >> May 03, 2024 10:23 AM DeAngela L wrote: Red Word that prompted transfer to Nurse Triage: patient woke up to a bloody nose The patient took her blood pressure Saturday the numbers have been up and down and allover the place and she remember 147 being one of the high numbers and she was really tired and has stayed in this weekend after Saturdays event Patient says this morning 136/74 is still a little elevated and she is still really tired     Pt number [phone] (M)

## 2024-05-03 NOTE — Telephone Encounter (Signed)
 Appointment scheduled.

## 2024-05-04 ENCOUNTER — Encounter: Payer: Self-pay | Admitting: Family Medicine

## 2024-05-04 ENCOUNTER — Ambulatory Visit (INDEPENDENT_AMBULATORY_CARE_PROVIDER_SITE_OTHER): Admitting: Family Medicine

## 2024-05-04 VITALS — BP 128/76 | HR 61 | Temp 97.4°F | Ht 59.0 in | Wt 180.0 lb

## 2024-05-04 DIAGNOSIS — R0989 Other specified symptoms and signs involving the circulatory and respiratory systems: Secondary | ICD-10-CM | POA: Diagnosis not present

## 2024-05-04 NOTE — Progress Notes (Signed)
 Subjective: CC:HTN PCP: Lavell Bari LABOR, FNP YEP:Cynthia Mays is a 83 y.o. female presenting to clinic today for:  Discussed the use of AI scribe software for clinical note transcription with the patient, who gave verbal consent to proceed.  History of Present Illness   Cynthia Mays is an 83 year old female with hypertension who presents with fluctuating blood pressure readings and a recent nosebleed.  She has been experiencing fluctuating blood pressure readings at home, describing them as 'all over the place.' Her usual blood pressure is around 120/70 mmHg, but recently it has been higher. On Saturday morning, she experienced epistaxis upon waking, and her blood pressure was elevated at that time. Since then, her blood pressure has been variable, with a reading of 139/74 mmHg this morning and 140/100 mmHg last night.  She denies any changes in her medication regimen and states she has not increased her salt intake or experienced more stress than usual, although she acknowledges some stress. She lives alone and engages in daily outdoor activities and chores, which may have contributed to her feeling 'tired' and 'worn out.' Despite this, she reports no issues with sleep and denies symptoms such as headaches, blurred vision, or dizziness during these blood pressure fluctuations.  Her social history reveals that she lives independently and has family members who are often unavailable to assist her due to their own commitments. She has two great-grandsons in college, a great-granddaughter with young children, and a daughter who works daily except weekends. 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  Her family history includes a sister who has had ovarian and colon cancer and is currently dealing with kidney issues.      ROS: Per HPI  Allergies  Allergen Reactions   Codeine Other (See Comments)    Headache, gi upset    Penicillins Rash    Has patient had a PCN reaction causing immediate rash, facial/tongue/throat swelling, SOB or lightheadedness with hypotension: Yes Has patient had a PCN reaction causing severe rash involving mucus membranes or skin necrosis: No Has patient had a PCN reaction that required hospitalization No Has patient had a PCN reaction occurring within the last 10 years: No If all of the above answers are NO, then may proceed with Cephalosporin use.    Pravastatin     Leg cramps   Past Medical History:  Diagnosis Date   Anxiety    Complication of anesthesia    Depression    Diabetes mellitus    Fibromyalgia    GERD (gastroesophageal reflux disease)    Hiatal hernia    Hyperlipidemia    Hypertension    Hypothyroidism    Hypothyroidism 07/23/2016   PONV (postoperative nausea and vomiting)     Current Outpatient Medications:    aspirin  EC 81 MG tablet, Take 1 tablet (81 mg total) by mouth daily. Swallow whole., Disp: 90 tablet, Rfl: 1   CALCIUM  PO, Take by mouth 1 day or 1 dose., Disp: , Rfl:    cetirizine  (ZYRTEC  ALLERGY) 10 MG tablet, Take 1 tablet (10 mg total) by mouth daily., Disp: 90 tablet, Rfl: 1   cholecalciferol (VITAMIN D3) 25 MCG (1000 UNIT) tablet, Take 1,000 Units by mouth daily. , Disp: , Rfl:    clobetasol  cream (TEMOVATE ) 0.05 %, APPLY  CREAM TOPICALLY TO AFFECTED AREA ONCE DAILY AS NEEDED FOR RASH, Disp: 30 g, Rfl: 5   diazepam  (VALIUM ) 5 MG tablet, Take 1 tablet (  5 mg total) by mouth every 6 (six) hours as needed for anxiety or muscle spasms., Disp: 30 tablet, Rfl: 2   diclofenac  (VOLTAREN ) 75 MG EC tablet, Take 1 tablet by mouth twice daily, Disp: 60 tablet, Rfl: 0   famotidine  (PEPCID ) 40 MG tablet, Take 1 tablet (40 mg total) by mouth daily., Disp: 90 tablet, Rfl: 1   FARXIGA  5 MG TABS tablet, Take 1 tablet (5 mg total) by mouth daily before breakfast., Disp: 90 tablet, Rfl: 0   fluticasone (FLONASE) 50 MCG/ACT nasal spray, Place 2 sprays into the nose  daily., Disp: , Rfl:    gabapentin  (NEURONTIN ) 300 MG capsule, Take 1 capsule (300 mg total) by mouth 3 (three) times daily., Disp: 90 capsule, Rfl: 0   HYDROcodone -acetaminophen  (NORCO/VICODIN) 5-325 MG tablet, Take 1 tablet by mouth every 6 (six) hours as needed for moderate pain (pain score 4-6)., Disp: 90 tablet, Rfl: 0   [START ON 05/25/2024] HYDROcodone -acetaminophen  (NORCO/VICODIN) 5-325 MG tablet, Take 1 tablet by mouth every 6 (six) hours as needed for moderate pain (pain score 4-6)., Disp: 90 tablet, Rfl: 0   [START ON 06/23/2024] HYDROcodone -acetaminophen  (NORCO/VICODIN) 5-325 MG tablet, Take 1 tablet by mouth every 6 (six) hours as needed for moderate pain (pain score 4-6)., Disp: 90 tablet, Rfl: 0   levothyroxine  (SYNTHROID ) 125 MCG tablet, Take 1 tablet by mouth once daily, Disp: 90 tablet, Rfl: 0   meclizine  (ANTIVERT ) 12.5 MG tablet, Take 1 tablet (12.5 mg total) by mouth 3 (three) times daily as needed for dizziness., Disp: 90 tablet, Rfl: 2   mesalamine  (LIALDA ) 1.2 g EC tablet, TAKE 2 TABLETS BY MOUTH ONCE DAILY WITH BREAKFAST, Disp: 180 tablet, Rfl: 0   Multiple Vitamins-Minerals (MULTIVITAMINS THER. W/MINERALS) TABS, Take 1 tablet by mouth daily., Disp: , Rfl:    NIFEdipine  (PROCARDIA -XL/NIFEDICAL-XL) 30 MG 24 hr tablet, Take 1 tablet (30 mg total) by mouth daily., Disp: 90 tablet, Rfl: 3   ondansetron  (ZOFRAN  ODT) 8 MG disintegrating tablet, Take 1 tablet (8 mg total) by mouth every 8 (eight) hours as needed for nausea or vomiting., Disp: 20 tablet, Rfl: 0   pantoprazole  (PROTONIX ) 40 MG tablet, Take 1 tablet (40 mg total) by mouth 2 (two) times daily., Disp: 180 tablet, Rfl: 0   Probiotic Product (PHILLIPS COLON HEALTH) CAPS, Take 1 capsule by mouth daily. , Disp: , Rfl:    rosuvastatin  (CRESTOR ) 5 MG tablet, Take 1 tablet (5 mg total) by mouth daily., Disp: 90 tablet, Rfl: 3   sertraline  (ZOLOFT ) 50 MG tablet, TAKE 1 & 1/2 (ONE & ONE-HALF) TABLETS BY MOUTH ONCE DAILY, Disp: 135  tablet, Rfl: 0   traZODone  (DESYREL ) 50 MG tablet, Take 1-2 tablets (50-100 mg total) by mouth at bedtime., Disp: 180 tablet, Rfl: 2 Social History   Socioeconomic History   Marital status: Widowed    Spouse name: Not on file   Number of children: 4   Years of education: 30   Highest education level: 12th grade  Occupational History    Employer: RETIRED  Tobacco Use   Smoking status: Former    Current packs/day: 0.00    Types: Cigarettes    Quit date: 05/02/1970    Years since quitting: 54.0   Smokeless tobacco: Never   Tobacco comments:    > 20 years quit  Vaping Use   Vaping status: Never Used  Substance and Sexual Activity   Alcohol use: Not Currently   Drug use: No   Sexual activity: Not Currently  Birth control/protection: Post-menopausal  Other Topics Concern   Not on file  Social History Narrative   Lives alone.  Widowed    1 dog, 2 cats   2 daughters, 1 local   Enjoys dancing and sewing   Daughter lives 5 miles away, grandchildren grown and visit often   Social Drivers of Corporate investment banker Strain: Low Risk  (12/07/2023)   Overall Financial Resource Strain (CARDIA)    Difficulty of Paying Living Expenses: Not hard at all  Food Insecurity: Low Risk  (03/02/2024)   Received from [facility]   Hunger Vital Sign    Within the past 12 months, you worried that your food would run out before you got money to buy more: Never true    Within the past 12 months, the food you bought just didn't last and you didn't have money to get more. : Never true  Transportation Needs: No Transportation Needs (03/02/2024)   Received from [facility]    In the past 12 months, has lack of reliable transportation kept you from medical appointments, meetings, work or from getting things needed for daily living? : No  Physical Activity: Insufficiently Active (12/07/2023)   Exercise Vital Sign    Days of Exercise per Week: 3 days    Minutes of Exercise per  Session: 30 min  Stress: No Stress Concern Present (12/07/2023)   [employer] of Occupational Health - Occupational Stress Questionnaire    Feeling of Stress : Not at all  Social Connections: Socially Isolated (12/07/2023)   Social Connection and Isolation Panel    Frequency of Communication with Friends and Family: More than three times a week    Frequency of Social Gatherings with Friends and Family: Three times a week    Attends Religious Services: Never    Active Member of Clubs or Organizations: No    Attends Banker Meetings: Never    Marital Status: Widowed  Intimate Partner Violence: Not At Risk (12/07/2023)   Humiliation, Afraid, Rape, and Kick questionnaire    Fear of Current or Ex-Partner: No    Emotionally Abused: No    Physically Abused: No    Sexually Abused: No   Family History  Problem Relation Age of Onset   Anesthesia problems Mother    CAD Mother 44   Cancer Mother        Pancreatic cancer   Hypertension Father    Suicidality Father        gunshot   Deafness Sister    Heart disease Brother    Suicidality Son    Cancer Sister        female    Suicidality Brother    Hypotension Neg Hx    Pseudochol deficiency Neg Hx    Malignant hyperthermia Neg Hx     Objective: Office vital signs reviewed. BP 128/76   Pulse 61   Temp (!) 97.4 F (36.3 C)   Ht 4' 11 (1.499 m)   Wt 180 lb (81.6 kg)   SpO2 94%   BMI 36.36 kg/m   Physical Examination:  General: Awake, alert, well nourished, obese female. No acute distress HEENT: sclera white, MMM Cardio: regular rate and rhythm, S1S2 heard, no murmurs appreciated Pulm: clear to auscultation bilaterally, no wheezes, rhonchi or rales; normal work of breathing on room air Extremities: no edema  Assessment/ Plan: 83 y.o. female   Labile blood pressure  Assessment and Plan    Hypertension with  episodic blood pressure fluctuations Episodic blood pressure fluctuations with recent readings from  120/70 to 140/74. Recent epistaxis associated with elevated blood pressure. Possible stress-related fluctuations due to increased physical activity and lack of assistance with chores. Current medication may be adequate for baseline control, but transient elevations may require additional management. - Monitor blood pressure regularly at home. - Bring in home blood pressure cuff to compare readings with office cuff. - Consider adding hydralazine  as needed for transient elevations, emphasizing its short-acting nature and importance of not using it daily to avoid hypotension. - Advise on limiting salt intake and managing stress.   Follow-Up Plan to return to the office tomorrow if possible to expedite management before trip to [place] . - Return with blood pressure cuff for comparison before leaving for [place] . - Consider adding hydralazine  if needed based on blood pressure readings.       Cynthia Mays Fielding, DO [facility] [phone]

## 2024-05-05 ENCOUNTER — Ambulatory Visit (INDEPENDENT_AMBULATORY_CARE_PROVIDER_SITE_OTHER)

## 2024-05-05 NOTE — Progress Notes (Signed)
 Patient is in office today for a nurse visit for home blood pressure to be tested to the office machine. Home blood pressure machine reading was 140/77 and 2 minutes later the office machine reading was 124/69. Patient stated that she had just replaced the batteries in her machine last night but she has had the machine for very many years. I advised patient it may be time to invest in a new machine. I gave the patient a blood pressure log and advised patient to keep track of her readings.

## 2024-05-24 ENCOUNTER — Other Ambulatory Visit: Payer: Self-pay | Admitting: Family

## 2024-05-24 DIAGNOSIS — E1169 Type 2 diabetes mellitus with other specified complication: Secondary | ICD-10-CM

## 2024-05-24 DIAGNOSIS — K529 Noninfective gastroenteritis and colitis, unspecified: Secondary | ICD-10-CM

## 2024-05-24 DIAGNOSIS — K633 Ulcer of intestine: Secondary | ICD-10-CM

## 2024-05-24 DIAGNOSIS — E039 Hypothyroidism, unspecified: Secondary | ICD-10-CM

## 2024-06-01 ENCOUNTER — Telehealth: Payer: Self-pay | Admitting: Family

## 2024-06-01 NOTE — Telephone Encounter (Unsigned)
 Copied from CRM #[id number]. Topic: Clinical - Medication Question >> Jun 01, 2024 11:41 AM Willma SAUNDERS wrote: Reason for CRM: Patient is currently taking diazepam  (VALIUM ) 5 MG tablet, HYDROcodone -acetaminophen  (NORCO/VICODIN) 5-325 MG tablet and meclizine  (ANTIVERT ) 12.5 MG tablet, which causes falls, confusion, and CNS depression, they are requesting to see if the patient can be prescribed something else or stopped.  Hassina can be reached at [phone]

## 2024-06-02 NOTE — Telephone Encounter (Signed)
 Pt only takes Norco and Valium  as needed. Does not take together. We can d/c antivert .

## 2024-06-02 NOTE — Telephone Encounter (Signed)
 [facility] AWARE

## 2024-06-28 ENCOUNTER — Telehealth: Payer: Self-pay

## 2024-06-28 NOTE — Telephone Encounter (Signed)
 Copied from CRM [id number]. Topic: Clinical - Medication Question >> Jun 28, 2024  2:01 PM Everette C wrote: Reason for CRM: Toribio with [employer] would like to speak with a member of clinical staff to discuss the patient's use and alternative options for use HYDROcodone -acetaminophen  (NORCO/VICODIN) 5-325 MG tablet  and diazepam  (VALIUM ) 5 MG tablet combined could lead to respiratory depression and and other concerns. Toribio would like for alternative prescription options to be discussed with the patient at their next visit.   Toribio can be reached at [phone] ext [phone] if needed further

## 2024-06-28 NOTE — Telephone Encounter (Signed)
 FYI

## 2024-07-04 ENCOUNTER — Encounter: Payer: Self-pay | Admitting: Radiology

## 2024-07-11 ENCOUNTER — Other Ambulatory Visit: Payer: Self-pay

## 2024-07-11 NOTE — Progress Notes (Signed)
 Pharmacy Quality Measure Review  This patient is appearing on a report for being at risk of failing the adherence measure for cholesterol (statin) medications this calendar year.   Medication: rosuvastatin  5 mg Last fill date: 04/09/24 for 90 day supply  Spoke with Roselie over the phone in regard to medication management. She denies any side effects (e.g., leg cramps or muscle aches) or missed doses. She does have a handful of tablets remaining in her pill bottle and does not need a refill at this time. She endorses having a weekly pill organizer at home that she uses. She denies any cost concerns with her medications. Counseled on calling the pharmacy with 4-5 tablets remaining to provide ample time before running out. She voiced understanding.   Woodie Jock, PharmD PGY1 Pharmacy Resident  07/11/2024

## 2024-07-26 ENCOUNTER — Encounter: Payer: Self-pay | Admitting: Family

## 2024-07-26 ENCOUNTER — Ambulatory Visit (INDEPENDENT_AMBULATORY_CARE_PROVIDER_SITE_OTHER): Payer: Self-pay | Admitting: Family

## 2024-07-26 VITALS — BP 115/69 | HR 69 | Temp 96.2°F | Ht 59.0 in | Wt 174.2 lb

## 2024-07-26 DIAGNOSIS — I2510 Atherosclerotic heart disease of native coronary artery without angina pectoris: Secondary | ICD-10-CM

## 2024-07-26 DIAGNOSIS — Z7984 Long term (current) use of oral hypoglycemic drugs: Secondary | ICD-10-CM | POA: Diagnosis not present

## 2024-07-26 DIAGNOSIS — Z23 Encounter for immunization: Secondary | ICD-10-CM | POA: Diagnosis not present

## 2024-07-26 DIAGNOSIS — E1169 Type 2 diabetes mellitus with other specified complication: Secondary | ICD-10-CM

## 2024-07-26 DIAGNOSIS — M1710 Unilateral primary osteoarthritis, unspecified knee: Secondary | ICD-10-CM

## 2024-07-26 DIAGNOSIS — F411 Generalized anxiety disorder: Secondary | ICD-10-CM

## 2024-07-26 DIAGNOSIS — M5441 Lumbago with sciatica, right side: Secondary | ICD-10-CM

## 2024-07-26 DIAGNOSIS — Z79899 Other long term (current) drug therapy: Secondary | ICD-10-CM

## 2024-07-26 DIAGNOSIS — G8929 Other chronic pain: Secondary | ICD-10-CM

## 2024-07-26 DIAGNOSIS — K219 Gastro-esophageal reflux disease without esophagitis: Secondary | ICD-10-CM

## 2024-07-26 DIAGNOSIS — E039 Hypothyroidism, unspecified: Secondary | ICD-10-CM

## 2024-07-26 DIAGNOSIS — K529 Noninfective gastroenteritis and colitis, unspecified: Secondary | ICD-10-CM

## 2024-07-26 DIAGNOSIS — I10 Essential (primary) hypertension: Secondary | ICD-10-CM

## 2024-07-26 DIAGNOSIS — G4700 Insomnia, unspecified: Secondary | ICD-10-CM

## 2024-07-26 LAB — CMP14+EGFR
ALT: 15 IU/L (ref 0–32)
AST: 24 IU/L (ref 0–40)
Albumin: 4.2 g/dL (ref 3.7–4.7)
Alkaline Phosphatase: 109 IU/L (ref 48–129)
BUN/Creatinine Ratio: 20 (ref 12–28)
BUN: 19 mg/dL (ref 8–27)
Bilirubin Total: 0.4 mg/dL (ref 0.0–1.2)
CO2: 21 mmol/L (ref 20–29)
Calcium: 9.3 mg/dL (ref 8.7–10.3)
Chloride: 108 mmol/L — ABNORMAL HIGH (ref 96–106)
Creatinine, Ser: 0.93 mg/dL (ref 0.57–1.00)
Globulin, Total: 3 g/dL (ref 1.5–4.5)
Glucose: 101 mg/dL — ABNORMAL HIGH (ref 70–99)
Potassium: 3.9 mmol/L (ref 3.5–5.2)
Sodium: 145 mmol/L — ABNORMAL HIGH (ref 134–144)
Total Protein: 7.2 g/dL (ref 6.0–8.5)
eGFR: 61 mL/min/1.73 (ref >59)

## 2024-07-26 MED ORDER — HYDROCODONE-ACETAMINOPHEN 5-325 MG PO TABS
1.0000 | ORAL_TABLET | Freq: Four times a day (QID) | ORAL | 0 refills | Status: AC | PRN
Start: 2024-09-22 — End: ?

## 2024-07-26 MED ORDER — GABAPENTIN 300 MG PO CAPS: 300.0000 mg | ORAL_CAPSULE | Freq: Three times a day (TID) | ORAL | 0 refills | Status: AC

## 2024-07-26 MED ORDER — MESALAMINE 1.2 G PO TBEC: DELAYED_RELEASE_TABLET | ORAL | 0 refills | Status: AC

## 2024-07-26 MED ORDER — SERTRALINE HCL 50 MG PO TABS: ORAL_TABLET | ORAL | 0 refills | Status: AC

## 2024-07-26 MED ORDER — HYDROCODONE-ACETAMINOPHEN 5-325 MG PO TABS
1.0000 | ORAL_TABLET | Freq: Four times a day (QID) | ORAL | 0 refills | Status: AC | PRN
Start: 2024-08-22 — End: ?

## 2024-07-26 MED ORDER — HYDROCODONE-ACETAMINOPHEN 5-325 MG PO TABS: 1.0000 | ORAL_TABLET | Freq: Four times a day (QID) | ORAL | 0 refills | Status: AC | PRN

## 2024-07-26 MED ORDER — PANTOPRAZOLE SODIUM 40 MG PO TBEC: 40.0000 mg | DELAYED_RELEASE_TABLET | Freq: Two times a day (BID) | ORAL | 0 refills | Status: AC

## 2024-07-26 MED ORDER — FARXIGA 5 MG PO TABS
5.0000 mg | ORAL_TABLET | Freq: Every day | ORAL | 0 refills | Status: AC
Start: 2024-07-26 — End: ?

## 2024-07-26 MED ORDER — DIAZEPAM 5 MG PO TABS: 5.0000 mg | ORAL_TABLET | Freq: Four times a day (QID) | ORAL | 2 refills | Status: AC | PRN

## 2024-07-26 NOTE — Patient Instructions (Signed)
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
## 2024-07-27 LAB — MICROALBUMIN / CREATININE URINE RATIO
Creatinine, Urine: 85.6 mg/dL
Microalb/Creat Ratio: 13 mg/g{creat} (ref 0–29)
Microalbumin, Urine: 10.7 ug/mL

## 2024-07-29 ENCOUNTER — Other Ambulatory Visit: Payer: Self-pay | Admitting: Family

## 2024-07-29 DIAGNOSIS — R42 Dizziness and giddiness: Secondary | ICD-10-CM

## 2024-08-01 ENCOUNTER — Ambulatory Visit: Payer: Self-pay | Admitting: Family

## 2024-08-19 ENCOUNTER — Ambulatory Visit: Payer: Self-pay | Admitting: Family Medicine

## 2024-08-19 ENCOUNTER — Ambulatory Visit
Admission: EM | Admit: 2024-08-19 | Discharge: 2024-08-19 | Disposition: A | Discharge status: Home / Self Care | Attending: Family Medicine | Admitting: Family Medicine

## 2024-08-19 ENCOUNTER — Other Ambulatory Visit: Payer: Self-pay

## 2024-08-19 ENCOUNTER — Ambulatory Visit

## 2024-08-19 ENCOUNTER — Encounter: Payer: Self-pay | Admitting: Emergency Medicine

## 2024-08-19 DIAGNOSIS — M25562 Pain in left knee: Secondary | ICD-10-CM | POA: Diagnosis not present

## 2024-08-19 DIAGNOSIS — W19XXXA Unspecified fall, initial encounter: Secondary | ICD-10-CM

## 2024-08-19 NOTE — Discharge Instructions (Signed)
 We will give you a call if anything comes back abnormal on your x-ray.  Rest, ice, compression, elevation, over-the-counter pain relievers as needed.  Follow-up with orthopedics if worsening or not resolving

## 2024-08-19 NOTE — ED Triage Notes (Signed)
 Pt reports left knee burning sensation since tripping over a cat carrier at home earlier today. Pt reports hit anterior knee but reports burning is in back of left knee. Pt unable to bear weight without pain.

## 2024-08-19 NOTE — ED Provider Notes (Signed)
 " [facility]    CSN: [id number] Arrival date & time: 08/19/24  1553      History   Chief Complaint Chief Complaint  Patient presents with   Knee Pain    HPI Cynthia Mays is a 83 y.o. female.   Patient presenting today with left knee pain since tripping and falling onto the knee earlier today.  Has an abrasion below the knee anteriorly but notes the pain is posteriorly.  Denies loss of range of motion, numbness, tingling, weakness, pain elsewhere from the fall.  So far not trying anything over-the-counter for symptoms.  Notes that the pain is worse with weightbearing but she does have some range of motion.    Past Medical History:  Diagnosis Date   Anxiety    Complication of anesthesia    Depression    Diabetes mellitus    Fibromyalgia    GERD (gastroesophageal reflux disease)    Hiatal hernia    Hyperlipidemia    Hypertension    Hypothyroidism    Hypothyroidism 07/23/2016   PONV (postoperative nausea and vomiting)     Patient Active Problem List   Diagnosis Date Noted   Intractable pain 03/04/2023   Controlled substance agreement signed 08/15/2022   History of lumbar fusion 12/02/2021   Insomnia 05/14/2021   Lesion of adrenal gland 03/19/2021   Colonic ulcer 08/13/2020   Morbid obesity (HCC) 03/31/2019   Chronic use of benzodiazepine for therapeutic purpose 01/26/2018   Hiatal hernia 10/29/2017   Hyperlipidemia associated with type 2 diabetes mellitus (HCC) 12/31/2015   Coronary artery disease due to lipid rich plaque 12/31/2015   Osteoarthritis of knee 10/24/2015   Macular degeneration, 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   Essential hypertension 02/20/2015   Chronic low back pain with sciatica 02/20/2015   Gastroesophageal reflux disease without esophagitis 11/20/2014   Anxiety, generalized 11/20/2014   Hypothyroidism 12/27/2013   DM (diabetes mellitus) (HCC) 12/27/2013   Precordial pain 01/15/2013    Past Surgical History:  Procedure Laterality  Date   APPENDECTOMY  2002   arthroscopy  right 2002, left 2007   bilateral knees   BACK SURGERY  10/2007, 09/2008   x2   BIOPSY  10/02/2016   Procedure: BIOPSY;  Surgeon: Claudis RAYMOND Rivet, MD;  Location: [facility];  Service: Endoscopy;;  gastric   BIOPSY  05/04/2020   Procedure: BIOPSY;  Surgeon: Eartha Angelia Sieving, MD;  Location: [facility];  Service: Gastroenterology;;   CARPAL TUNNEL RELEASE  1986   bilateral   CESAREAN SECTION  1969, 1967   x2   COLONOSCOPY WITH PROPOFOL  N/A 05/04/2020   Eartha:The examined portion of the ileum was normal(focally active, nonspecific ileitis, neg for features of chronicity or granulomas)- A single (solitary) ulcer in the cecum.(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 tortuous colon, non bleeding internal hemorrhoids   ESOPHAGOGASTRODUODENOSCOPY N/A 10/02/2016   Procedure: ESOPHAGOGASTRODUODENOSCOPY (EGD);  Surgeon: Claudis RAYMOND Rivet, MD;  Location: [facility];  Service: Endoscopy;  Laterality: N/A;  11:15 - moved to 2/1 @ 3:00   ESOPHAGOGASTRODUODENOSCOPY (EGD) WITH PROPOFOL  N/A 05/04/2020   Castaneda: normal esophagus, 4cm hh with four cameron ulcers, multiple gastric polyps-fundic gland polyps, erythematous mucosa in pylorus, gastric antral mucosa and oxyntic mucosa with mild chronic gastritis and reactive changes, neg h pylori, normal examined duodenum   EXCISION METACARPAL MASS Right 03/21/2024   Procedure: EXCISION OF RIGHT LONG FINGER AND RIGHT RING FINGER MASSES;  Surgeon: Murrell Drivers, MD;  Location: [facility];  Service: Orthopedics;  Laterality: Right;  EXCISION OF RIGHT LONG FINGER AND RIGHT RING FINGER MASSES   KNEE SURGERY     both    OB History   No obstetric history on file.      Home Medications    Prior to Admission medications  Medication Sig Start Date End Date Taking? Authorizing Provider   aspirin  EC 81 MG tablet Take 1 tablet (81 mg total) by mouth daily. Swallow whole. 04/25/24   Lavell Lye A, FNP  CALCIUM  PO Take by mouth 1 day or 1 dose.    [provider]  cetirizine  (ZYRTEC  ALLERGY) 10 MG tablet Take 1 tablet (10 mg total) by mouth daily. 08/28/22   Lavell Lye A, FNP  cholecalciferol (VITAMIN D3) 25 MCG (1000 UNIT) tablet Take 1,000 Units by mouth daily.     [provider]  clobetasol  cream (TEMOVATE ) 0.05 % APPLY  CREAM TOPICALLY TO AFFECTED AREA ONCE DAILY AS NEEDED FOR RASH 09/20/23   Jayne Vonn DEL, MD  diazepam  (VALIUM ) 5 MG tablet Take 1 tablet (5 mg total) by mouth every 6 (six) hours as needed for anxiety or muscle spasms. 07/26/24   Lavell Lye LABOR, FNP  diclofenac  (VOLTAREN ) 75 MG EC tablet Take 1 tablet by mouth twice daily 05/24/24   Lavell Lye A, FNP  famotidine  (PEPCID ) 40 MG tablet Take 1 tablet (40 mg total) by mouth daily. 04/25/24   Lavell Lye LABOR, FNP  FARXIGA  5 MG TABS tablet Take 1 tablet (5 mg total) by mouth daily before breakfast. 07/26/24   Lavell Lye A, FNP  fluticasone (FLONASE) 50 MCG/ACT nasal spray Place 2 sprays into the nose daily.    [provider]  gabapentin  (NEURONTIN ) 300 MG capsule Take 1 capsule (300 mg total) by mouth 3 (three) times daily. 07/26/24   Lavell Lye LABOR, FNP  HYDROcodone -acetaminophen  (NORCO/VICODIN) 5-325 MG tablet Take 1 tablet by mouth every 6 (six) hours as needed for moderate pain (pain score 4-6). 07/26/24   Lavell Lye LABOR, FNP  HYDROcodone -acetaminophen  (NORCO/VICODIN) 5-325 MG tablet Take 1 tablet by mouth every 6 (six) hours as needed for moderate pain (pain score 4-6). 08/22/24   Lavell Lye LABOR, FNP  HYDROcodone -acetaminophen  (NORCO/VICODIN) 5-325 MG tablet Take 1 tablet by mouth every 6 (six) hours as needed for moderate pain (pain score 4-6). 09/22/24   Lavell Lye LABOR, FNP  levothyroxine  (SYNTHROID ) 125 MCG tablet Take 1 tablet by mouth once daily 05/24/24   Lavell Lye A, FNP  meclizine  (ANTIVERT ) 12.5 MG tablet TAKE 1 TABLET BY MOUTH THREE TIMES DAILY AS NEEDED FOR DIZZINESS 08/01/24   Lavell Lye A, FNP  mesalamine  (LIALDA ) 1.2 g EC tablet TAKE 2 TABLETS BY MOUTH ONCE DAILY WITH BREAKFAST 07/26/24   Lavell Lye LABOR, FNP  Multiple Vitamins-Minerals (MULTIVITAMINS THER. W/MINERALS) TABS Take 1 tablet by mouth daily.    [provider]  NIFEdipine  (PROCARDIA -XL/NIFEDICAL-XL) 30 MG 24 hr tablet Take 1 tablet (30 mg total) by mouth daily. 12/21/23   Lavell Lye LABOR, FNP  ondansetron  (ZOFRAN  ODT) 8 MG disintegrating tablet Take 1 tablet (8 mg total) by mouth every 8 (eight) hours as needed for nausea or vomiting. 09/21/23   Lavell Lye LABOR, FNP  pantoprazole  (PROTONIX ) 40 MG tablet Take 1 tablet (40 mg total) by mouth 2 (two) times daily. 07/26/24   Lavell Lye LABOR, FNP  Probiotic Product (PHILLIPS COLON HEALTH) CAPS Take 1 capsule by  mouth daily.     [provider]  rosuvastatin  (CRESTOR ) 5 MG tablet Take 1 tablet (5 mg total) by mouth daily. 12/21/23   Lavell Lye A, FNP  sertraline  (ZOLOFT ) 50 MG tablet TAKE 1 & 1/2 (ONE & ONE-HALF) TABLETS BY MOUTH ONCE DAILY 07/26/24   Lavell Lye A, FNP  traZODone  (DESYREL ) 50 MG tablet Take 1-2 tablets (50-100 mg total) by mouth at bedtime. 12/21/23   Lavell Lye LABOR, FNP    Family History Family History  Problem Relation Age of Onset   Anesthesia problems Mother    CAD Mother 9   Cancer Mother        Pancreatic cancer   Hypertension Father    Suicidality Father        gunshot   Deafness Sister    Heart disease Brother    Suicidality Son    Cancer Sister        female    Suicidality Brother    Hypotension Neg Hx    Pseudochol deficiency Neg Hx    Malignant hyperthermia Neg Hx     Social History Social History[1]   Allergies   Codeine, Penicillins, and Pravastatin   Review of Systems Review of Systems Per HPI  Physical Exam Triage Vital Signs ED Triage Vitals  [08/19/24 1645]  Encounter Vitals Group     BP 108/73     Girls Systolic BP Percentile      Girls Diastolic BP Percentile      Boys Systolic BP Percentile      Boys Diastolic BP Percentile      Pulse Rate 72     Resp 20     Temp 99.5 F (37.5 C)     Temp Source Oral     SpO2 92 %     Weight      Height      Head Circumference      Peak Flow      Pain Score 6     Pain Loc      Pain Education      Exclude from Growth Chart    No data found.  Updated Vital Signs BP 108/73 (BP Location: Left Arm)   Pulse 72   Temp 99.5 F (37.5 C) (Oral)   Resp 20   SpO2 92%   Visual Acuity Right Eye Distance:   Left Eye Distance:   Bilateral Distance:    Right Eye Near:   Left Eye Near:    Bilateral Near:     Physical Exam Vitals and nursing note reviewed.  Constitutional:      Appearance: Normal appearance. She is not ill-appearing.  HENT:     Head: Atraumatic.  Eyes:     Extraocular Movements: Extraocular movements intact.     Conjunctiva/sclera: Conjunctivae normal.  Cardiovascular:     Rate and Rhythm: Normal rate.  Pulmonary:     Effort: Pulmonary effort is normal.  Musculoskeletal:     Cervical back: Normal range of motion and neck supple.     Comments: Currently in wheelchair due to knee pain.  Slight abrasion noted to the anterior lower leg just below the patella.  Tender to palpation particularly laterally and posteriorly to the left knee but no deformity palpable, range of motion grossly intact, no joint instability on exam and negative McMurray's and drawer testing  Skin:    General: Skin is warm and dry.  Neurological:     Mental Status: She is alert and oriented to  person, place, and time.     Comments: Left lower extremity neurovascularly intact  Psychiatric:        Mood and Affect: Mood normal.        Thought Content: Thought content normal.        Judgment: Judgment normal.      UC Treatments / Results  Labs (all labs ordered are listed, but only  abnormal results are displayed) Labs Reviewed - No data to display  EKG   Radiology DG Knee Complete 4 Views Left Result Date: 08/19/2024 CLINICAL DATA:  Left knee pain after fall today. EXAM: LEFT KNEE - COMPLETE 4+ VIEW COMPARISON:  Knee radiograph 10/24/2015 FINDINGS: Patellofemoral and mild medial tibiofemoral joint space narrowing. Mild to moderate tricompartmental peripheral spurring. No fracture, erosion, or focal bone abnormality. No joint effusion. Chondrocalcinosis. Peripheral vascular calcifications. Unremarkable soft tissues. IMPRESSION: 1. No acute fracture or subluxation of the left knee. 2. Mild to moderate tricompartmental osteoarthritis. Electronically Signed   By: Andrea Gasman M.D.   On: 08/19/2024 17:46    Procedures Procedures (including critical care time)  Medications Ordered in UC Medications - No data to display  Initial Impression / Assessment and Plan / UC Course  I have reviewed the triage vital signs and the nursing notes.  Pertinent labs & imaging results that were available during my care of the patient were reviewed by me and considered in my medical decision making (see chart for details).     X-ray of the left knee negative for acute bony abnormality, showing some chronic osteoarthritis but no acute injury.  Ace wrap applied, discussed RICE, over-the-counter pain relievers, Ortho follow-up if worsening or not resolving.  Final Clinical Impressions(s) / UC Diagnoses   Final diagnoses:  Acute pain of left knee  Fall, initial encounter     Discharge Instructions      We will give you a call if anything comes back abnormal on your x-ray.  Rest, ice, compression, elevation, over-the-counter pain relievers as needed.  Follow-up with orthopedics if worsening or not resolving    ED Prescriptions   None    PDMP not reviewed this encounter.    [1]  Social History Tobacco Use   Smoking status: Former    Current packs/day: 0.00    Types:  Cigarettes    Quit date: 05/02/1970    Years since quitting: 54.3   Smokeless tobacco: Never   Tobacco comments:    > 20 years quit  Vaping Use   Vaping status: Never Used  Substance Use Topics   Alcohol use: Not Currently   Drug use: No     Stuart Vernell Norris, PA-C 08/19/24 1825  "

## 2024-08-22 ENCOUNTER — Telehealth: Payer: Self-pay | Admitting: Pharmacist

## 2024-08-22 DIAGNOSIS — G720 Drug-induced myopathy: Secondary | ICD-10-CM | POA: Insufficient documentation

## 2024-08-22 DIAGNOSIS — E785 Hyperlipidemia, unspecified: Secondary | ICD-10-CM

## 2024-08-22 MED ORDER — ROSUVASTATIN CALCIUM 5 MG PO TABS: 5.0000 mg | ORAL_TABLET | Freq: Every day | ORAL | 3 refills | Status: DC

## 2024-08-22 MED ORDER — ROSUVASTATIN CALCIUM 5 MG PO TABS: 5.0000 mg | ORAL_TABLET | Freq: Every day | ORAL | 3 refills | Status: AC

## 2024-08-22 NOTE — Telephone Encounter (Signed)
" ° °  This patient is appearing on a report for being at risk of failing the adherence measure for cholesterol (statin) medications this calendar year.   Medication: rosuvastatin  Last fill date: 8/9 for 90 day supply  Will collaborate with provider to facilitate refill needs. Associated G72 ICD with prescription so exclusion code will drop next year upon re-order.  Patient was not able to take pravastatin and now inconsistent administration of daily rosuvastatin .  Notes left for PCP to address in 10/2024 office visit as well    Mliss Tarry Griffin, PharmD, BCACP, CPP Clinical Pharmacist, [facility]  "

## 2024-08-23 ENCOUNTER — Other Ambulatory Visit: Payer: Self-pay | Admitting: Family

## 2024-08-23 DIAGNOSIS — J069 Acute upper respiratory infection, unspecified: Secondary | ICD-10-CM

## 2024-10-27 ENCOUNTER — Ambulatory Visit: Admitting: Family

## 2024-12-07 ENCOUNTER — Ambulatory Visit: Payer: Self-pay
